# Patient Record
Sex: Male | Born: 1970 | Race: Black or African American | Hispanic: No | Smoking: Never smoker
Health system: Southern US, Community
[De-identification: ages and names within clinical notes are randomized; demographics above are authoritative.]

## PROBLEM LIST (undated history)

## (undated) ENCOUNTER — Emergency Department (HOSPITAL_COMMUNITY): Payer: Medicare Other | Source: Home / Self Care

## (undated) DIAGNOSIS — J9610 Chronic respiratory failure, unspecified whether with hypoxia or hypercapnia: Secondary | ICD-10-CM

## (undated) DIAGNOSIS — F329 Major depressive disorder, single episode, unspecified: Secondary | ICD-10-CM

## (undated) DIAGNOSIS — I4892 Unspecified atrial flutter: Secondary | ICD-10-CM

## (undated) DIAGNOSIS — L03119 Cellulitis of unspecified part of limb: Secondary | ICD-10-CM

## (undated) DIAGNOSIS — H539 Unspecified visual disturbance: Secondary | ICD-10-CM

## (undated) DIAGNOSIS — I10 Essential (primary) hypertension: Secondary | ICD-10-CM

## (undated) DIAGNOSIS — H02409 Unspecified ptosis of unspecified eyelid: Secondary | ICD-10-CM

## (undated) DIAGNOSIS — F32A Depression, unspecified: Secondary | ICD-10-CM

## (undated) DIAGNOSIS — N182 Chronic kidney disease, stage 2 (mild): Secondary | ICD-10-CM

## (undated) DIAGNOSIS — E119 Type 2 diabetes mellitus without complications: Secondary | ICD-10-CM

## (undated) DIAGNOSIS — M545 Low back pain, unspecified: Secondary | ICD-10-CM

## (undated) DIAGNOSIS — I4891 Unspecified atrial fibrillation: Secondary | ICD-10-CM

## (undated) DIAGNOSIS — G8929 Other chronic pain: Secondary | ICD-10-CM

## (undated) DIAGNOSIS — G4730 Sleep apnea, unspecified: Secondary | ICD-10-CM

## (undated) DIAGNOSIS — I509 Heart failure, unspecified: Secondary | ICD-10-CM

## (undated) DIAGNOSIS — S86819A Strain of other muscle(s) and tendon(s) at lower leg level, unspecified leg, initial encounter: Secondary | ICD-10-CM

## (undated) DIAGNOSIS — E662 Morbid (severe) obesity with alveolar hypoventilation: Secondary | ICD-10-CM

## (undated) DIAGNOSIS — L0291 Cutaneous abscess, unspecified: Secondary | ICD-10-CM

## (undated) HISTORY — DX: Cutaneous abscess, unspecified: L02.91

## (undated) HISTORY — DX: Unspecified atrial fibrillation: I48.91

## (undated) HISTORY — DX: Low back pain: M54.5

## (undated) HISTORY — DX: Type 2 diabetes mellitus without complications: E11.9

## (undated) HISTORY — DX: Morbid (severe) obesity due to excess calories: E66.01

## (undated) HISTORY — DX: Other chronic pain: G89.29

## (undated) HISTORY — DX: Major depressive disorder, single episode, unspecified: F32.9

## (undated) HISTORY — PX: APPLICATION OF WOUND VAC: SHX5189

## (undated) HISTORY — DX: Depression, unspecified: F32.A

## (undated) HISTORY — DX: Heart failure, unspecified: I50.9

## (undated) HISTORY — PX: TONSILLECTOMY: SUR1361

## (undated) HISTORY — DX: Unspecified ptosis of unspecified eyelid: H02.409

## (undated) HISTORY — DX: Unspecified atrial flutter: I48.92

## (undated) HISTORY — PX: TONSILLECTOMY: SHX5217

## (undated) HISTORY — DX: Low back pain, unspecified: M54.50

## (undated) HISTORY — DX: Essential (primary) hypertension: I10

## (undated) HISTORY — PX: CHOLECYSTECTOMY: SHX55

---

## 2002-10-31 ENCOUNTER — Emergency Department (HOSPITAL_COMMUNITY): Admission: EM | Admit: 2002-10-31 | Discharge: 2002-10-31 | Payer: Self-pay | Admitting: Emergency Medicine

## 2003-04-27 ENCOUNTER — Emergency Department (HOSPITAL_COMMUNITY): Admission: EM | Admit: 2003-04-27 | Discharge: 2003-04-27 | Payer: Self-pay | Admitting: Emergency Medicine

## 2003-04-27 ENCOUNTER — Encounter: Payer: Self-pay | Admitting: Emergency Medicine

## 2003-06-05 ENCOUNTER — Emergency Department (HOSPITAL_COMMUNITY): Admission: EM | Admit: 2003-06-05 | Discharge: 2003-06-05 | Payer: Self-pay | Admitting: Emergency Medicine

## 2003-06-05 ENCOUNTER — Encounter: Payer: Self-pay | Admitting: Family Medicine

## 2003-06-08 ENCOUNTER — Emergency Department (HOSPITAL_COMMUNITY): Admission: AD | Admit: 2003-06-08 | Discharge: 2003-06-08 | Payer: Self-pay | Admitting: Emergency Medicine

## 2003-06-08 ENCOUNTER — Encounter: Payer: Self-pay | Admitting: Emergency Medicine

## 2004-03-31 ENCOUNTER — Emergency Department (HOSPITAL_COMMUNITY): Admission: EM | Admit: 2004-03-31 | Discharge: 2004-04-01 | Payer: Self-pay | Admitting: Emergency Medicine

## 2004-05-05 ENCOUNTER — Emergency Department (HOSPITAL_COMMUNITY): Admission: EM | Admit: 2004-05-05 | Discharge: 2004-05-05 | Payer: Self-pay | Admitting: Emergency Medicine

## 2005-02-08 ENCOUNTER — Emergency Department (HOSPITAL_COMMUNITY): Admission: EM | Admit: 2005-02-08 | Discharge: 2005-02-08 | Payer: Self-pay | Admitting: Emergency Medicine

## 2006-06-20 ENCOUNTER — Emergency Department (HOSPITAL_COMMUNITY): Admission: EM | Admit: 2006-06-20 | Discharge: 2006-06-20 | Payer: Self-pay | Admitting: Emergency Medicine

## 2006-12-05 ENCOUNTER — Emergency Department (HOSPITAL_COMMUNITY): Admission: EM | Admit: 2006-12-05 | Discharge: 2006-12-05 | Payer: Self-pay | Admitting: Family Medicine

## 2007-01-11 ENCOUNTER — Ambulatory Visit: Payer: Self-pay | Admitting: Internal Medicine

## 2007-01-11 LAB — CONVERTED CEMR LAB
ALT: 13 units/L (ref 0–40)
AST: 23 units/L (ref 0–37)
Alkaline Phosphatase: 63 units/L (ref 39–117)
BUN: 9 mg/dL (ref 6–23)
Basophils Relative: 0.3 % (ref 0.0–1.0)
Bilirubin, Direct: 0.1 mg/dL (ref 0.0–0.3)
CO2: 30 meq/L (ref 19–32)
Calcium: 9 mg/dL (ref 8.4–10.5)
Chloride: 106 meq/L (ref 96–112)
Eosinophils Absolute: 0 10*3/uL (ref 0.0–0.6)
Eosinophils Relative: 0.3 % (ref 0.0–5.0)
GFR calc Af Amer: 164 mL/min
GFR calc non Af Amer: 136 mL/min
Glucose, Bld: 53 mg/dL — ABNORMAL LOW (ref 70–99)
HDL: 37.4 mg/dL — ABNORMAL LOW (ref >39.0)
Hgb A1c MFr Bld: 9.4 % — ABNORMAL HIGH (ref 4.6–6.0)
Microalb Creat Ratio: 136.7 mg/g — ABNORMAL HIGH (ref 0.0–30.0)
Monocytes Relative: 4.5 % (ref 3.0–11.0)
Neutro Abs: 10.3 10*3/uL — ABNORMAL HIGH (ref 1.4–7.7)
Platelets: 229 10*3/uL (ref 150–400)
RBC: 5.1 M/uL (ref 4.22–5.81)
Total CHOL/HDL Ratio: 3.9
Triglycerides: 113 mg/dL (ref 0–149)
VLDL: 23 mg/dL (ref 0–40)
WBC: 12.4 10*3/uL — ABNORMAL HIGH (ref 4.5–10.5)

## 2007-02-14 ENCOUNTER — Ambulatory Visit: Payer: Self-pay | Admitting: Endocrinology

## 2007-03-28 ENCOUNTER — Ambulatory Visit: Payer: Self-pay | Admitting: Internal Medicine

## 2007-04-04 ENCOUNTER — Ambulatory Visit: Payer: Self-pay | Admitting: Internal Medicine

## 2007-05-20 ENCOUNTER — Ambulatory Visit: Payer: Self-pay | Admitting: Internal Medicine

## 2007-05-20 LAB — CONVERTED CEMR LAB
BUN: 8 mg/dL (ref 6–23)
Calcium: 9.1 mg/dL (ref 8.4–10.5)
Chloride: 105 meq/L (ref 96–112)
Creatinine,U: 97.9 mg/dL
GFR calc Af Amer: 141 mL/min
GFR calc non Af Amer: 116 mL/min
Hgb A1c MFr Bld: 10.3 % — ABNORMAL HIGH (ref 4.6–6.0)
Microalb Creat Ratio: 80.7 mg/g — ABNORMAL HIGH (ref 0.0–30.0)
Microalb, Ur: 7.9 mg/dL — ABNORMAL HIGH (ref 0.0–1.9)

## 2007-06-17 ENCOUNTER — Encounter: Payer: Self-pay | Admitting: Endocrinology

## 2007-06-17 DIAGNOSIS — I10 Essential (primary) hypertension: Secondary | ICD-10-CM | POA: Insufficient documentation

## 2007-06-17 DIAGNOSIS — F418 Other specified anxiety disorders: Secondary | ICD-10-CM | POA: Insufficient documentation

## 2007-08-26 ENCOUNTER — Emergency Department (HOSPITAL_COMMUNITY): Admission: EM | Admit: 2007-08-26 | Discharge: 2007-08-26 | Payer: Self-pay | Admitting: Family Medicine

## 2008-01-23 ENCOUNTER — Emergency Department (HOSPITAL_COMMUNITY): Admission: EM | Admit: 2008-01-23 | Discharge: 2008-01-24 | Payer: Self-pay | Admitting: Emergency Medicine

## 2008-03-20 ENCOUNTER — Inpatient Hospital Stay (HOSPITAL_COMMUNITY): Admission: EM | Admit: 2008-03-20 | Discharge: 2008-03-25 | Payer: Self-pay | Admitting: Emergency Medicine

## 2008-07-24 ENCOUNTER — Emergency Department (HOSPITAL_COMMUNITY): Admission: EM | Admit: 2008-07-24 | Discharge: 2008-07-25 | Payer: Self-pay | Admitting: Emergency Medicine

## 2008-10-06 ENCOUNTER — Emergency Department (HOSPITAL_COMMUNITY): Admission: EM | Admit: 2008-10-06 | Discharge: 2008-10-06 | Payer: Self-pay | Admitting: Emergency Medicine

## 2008-10-29 ENCOUNTER — Encounter: Admission: RE | Admit: 2008-10-29 | Discharge: 2008-10-29 | Payer: Self-pay | Admitting: Family Medicine

## 2009-04-30 ENCOUNTER — Encounter: Payer: Self-pay | Admitting: Emergency Medicine

## 2009-04-30 ENCOUNTER — Ambulatory Visit: Payer: Self-pay | Admitting: Cardiovascular Disease

## 2009-05-01 ENCOUNTER — Observation Stay (HOSPITAL_COMMUNITY): Admission: EM | Admit: 2009-05-01 | Discharge: 2009-05-04 | Payer: Self-pay | Admitting: Cardiovascular Disease

## 2009-05-01 ENCOUNTER — Ambulatory Visit: Payer: Self-pay | Admitting: Cardiology

## 2009-05-03 ENCOUNTER — Encounter: Payer: Self-pay | Admitting: Cardiology

## 2009-05-06 ENCOUNTER — Encounter (INDEPENDENT_AMBULATORY_CARE_PROVIDER_SITE_OTHER): Payer: Self-pay | Admitting: *Deleted

## 2009-05-06 ENCOUNTER — Ambulatory Visit: Payer: Self-pay | Admitting: Cardiology

## 2009-05-11 ENCOUNTER — Ambulatory Visit: Payer: Self-pay | Admitting: Internal Medicine

## 2009-05-18 ENCOUNTER — Telehealth: Payer: Self-pay | Admitting: Cardiology

## 2009-05-21 ENCOUNTER — Emergency Department (HOSPITAL_COMMUNITY): Admission: EM | Admit: 2009-05-21 | Discharge: 2009-05-22 | Payer: Self-pay | Admitting: Emergency Medicine

## 2009-05-24 ENCOUNTER — Telehealth: Payer: Self-pay | Admitting: Cardiology

## 2009-05-25 ENCOUNTER — Ambulatory Visit: Payer: Self-pay | Admitting: Cardiology

## 2009-05-25 ENCOUNTER — Inpatient Hospital Stay (HOSPITAL_COMMUNITY): Admission: EM | Admit: 2009-05-25 | Discharge: 2009-06-02 | Payer: Self-pay | Admitting: Emergency Medicine

## 2009-05-28 ENCOUNTER — Telehealth: Payer: Self-pay | Admitting: Cardiology

## 2009-05-31 ENCOUNTER — Encounter: Payer: Self-pay | Admitting: Cardiology

## 2009-05-31 ENCOUNTER — Ambulatory Visit: Payer: Self-pay | Admitting: Pulmonary Disease

## 2009-06-01 ENCOUNTER — Encounter: Payer: Self-pay | Admitting: Cardiology

## 2009-06-02 DIAGNOSIS — R0602 Shortness of breath: Secondary | ICD-10-CM

## 2009-06-02 DIAGNOSIS — I4891 Unspecified atrial fibrillation: Secondary | ICD-10-CM | POA: Insufficient documentation

## 2009-06-02 DIAGNOSIS — J40 Bronchitis, not specified as acute or chronic: Secondary | ICD-10-CM | POA: Insufficient documentation

## 2009-06-02 DIAGNOSIS — I4892 Unspecified atrial flutter: Secondary | ICD-10-CM | POA: Insufficient documentation

## 2009-06-02 DIAGNOSIS — H5500 Unspecified nystagmus: Secondary | ICD-10-CM | POA: Insufficient documentation

## 2009-06-02 DIAGNOSIS — J309 Allergic rhinitis, unspecified: Secondary | ICD-10-CM | POA: Insufficient documentation

## 2009-06-02 DIAGNOSIS — R7309 Other abnormal glucose: Secondary | ICD-10-CM | POA: Insufficient documentation

## 2009-06-02 DIAGNOSIS — G4730 Sleep apnea, unspecified: Secondary | ICD-10-CM | POA: Insufficient documentation

## 2009-06-02 DIAGNOSIS — E662 Morbid (severe) obesity with alveolar hypoventilation: Secondary | ICD-10-CM | POA: Insufficient documentation

## 2009-06-02 DIAGNOSIS — I509 Heart failure, unspecified: Secondary | ICD-10-CM | POA: Insufficient documentation

## 2009-06-02 DIAGNOSIS — H02409 Unspecified ptosis of unspecified eyelid: Secondary | ICD-10-CM | POA: Insufficient documentation

## 2009-06-04 ENCOUNTER — Ambulatory Visit: Payer: Self-pay | Admitting: Internal Medicine

## 2009-06-04 LAB — CONVERTED CEMR LAB: POC INR: 1.9

## 2009-06-08 ENCOUNTER — Telehealth: Payer: Self-pay | Admitting: Cardiology

## 2009-06-09 ENCOUNTER — Emergency Department (HOSPITAL_COMMUNITY): Admission: EM | Admit: 2009-06-09 | Discharge: 2009-06-09 | Payer: Self-pay | Admitting: Emergency Medicine

## 2009-06-10 ENCOUNTER — Encounter: Payer: Self-pay | Admitting: Cardiology

## 2009-06-10 ENCOUNTER — Ambulatory Visit: Payer: Self-pay | Admitting: Cardiovascular Disease

## 2009-06-10 LAB — CONVERTED CEMR LAB: POC INR: 4

## 2009-06-11 ENCOUNTER — Ambulatory Visit: Payer: Self-pay | Admitting: Pulmonary Disease

## 2009-06-11 DIAGNOSIS — G4733 Obstructive sleep apnea (adult) (pediatric): Secondary | ICD-10-CM

## 2009-06-16 ENCOUNTER — Inpatient Hospital Stay (HOSPITAL_COMMUNITY): Admission: EM | Admit: 2009-06-16 | Discharge: 2009-06-21 | Payer: Self-pay | Admitting: Emergency Medicine

## 2009-06-28 ENCOUNTER — Ambulatory Visit: Payer: Self-pay | Admitting: Cardiology

## 2009-06-28 LAB — CONVERTED CEMR LAB: POC INR: 2.6

## 2009-06-29 ENCOUNTER — Telehealth (INDEPENDENT_AMBULATORY_CARE_PROVIDER_SITE_OTHER): Payer: Self-pay | Admitting: *Deleted

## 2009-07-07 ENCOUNTER — Inpatient Hospital Stay (HOSPITAL_COMMUNITY): Admission: EM | Admit: 2009-07-07 | Discharge: 2009-07-13 | Payer: Self-pay | Admitting: Emergency Medicine

## 2009-07-07 ENCOUNTER — Telehealth: Payer: Self-pay | Admitting: Cardiology

## 2009-07-07 ENCOUNTER — Ambulatory Visit: Payer: Self-pay | Admitting: Internal Medicine

## 2009-07-26 ENCOUNTER — Ambulatory Visit: Payer: Self-pay | Admitting: Cardiovascular Disease

## 2009-07-26 LAB — CONVERTED CEMR LAB: POC INR: 1.1

## 2009-07-30 ENCOUNTER — Ambulatory Visit (HOSPITAL_COMMUNITY): Admission: RE | Admit: 2009-07-30 | Discharge: 2009-07-30 | Payer: Self-pay | Admitting: Cardiology

## 2009-08-06 ENCOUNTER — Ambulatory Visit: Payer: Self-pay | Admitting: Cardiovascular Disease

## 2009-08-06 LAB — CONVERTED CEMR LAB: POC INR: 3.9

## 2009-08-23 ENCOUNTER — Encounter: Payer: Self-pay | Admitting: Pulmonary Disease

## 2009-08-23 ENCOUNTER — Ambulatory Visit: Payer: Self-pay | Admitting: Cardiology

## 2009-08-23 ENCOUNTER — Ambulatory Visit: Payer: Self-pay | Admitting: Internal Medicine

## 2009-08-23 ENCOUNTER — Ambulatory Visit (HOSPITAL_BASED_OUTPATIENT_CLINIC_OR_DEPARTMENT_OTHER): Admission: RE | Admit: 2009-08-23 | Discharge: 2009-08-23 | Payer: Self-pay | Admitting: Pulmonary Disease

## 2009-08-23 LAB — CONVERTED CEMR LAB: POC INR: 3

## 2009-09-05 ENCOUNTER — Ambulatory Visit: Payer: Self-pay | Admitting: Pulmonary Disease

## 2009-09-06 ENCOUNTER — Telehealth (INDEPENDENT_AMBULATORY_CARE_PROVIDER_SITE_OTHER): Payer: Self-pay | Admitting: *Deleted

## 2009-10-18 ENCOUNTER — Telehealth (INDEPENDENT_AMBULATORY_CARE_PROVIDER_SITE_OTHER): Payer: Self-pay | Admitting: *Deleted

## 2009-10-22 ENCOUNTER — Encounter: Payer: Self-pay | Admitting: Pulmonary Disease

## 2009-10-28 ENCOUNTER — Encounter (INDEPENDENT_AMBULATORY_CARE_PROVIDER_SITE_OTHER): Payer: Self-pay | Admitting: Cardiology

## 2009-11-09 ENCOUNTER — Ambulatory Visit: Payer: Self-pay | Admitting: Cardiology

## 2009-11-09 ENCOUNTER — Ambulatory Visit: Payer: Self-pay | Admitting: Vascular Surgery

## 2009-11-09 ENCOUNTER — Encounter (INDEPENDENT_AMBULATORY_CARE_PROVIDER_SITE_OTHER): Payer: Self-pay | Admitting: Internal Medicine

## 2009-11-09 ENCOUNTER — Inpatient Hospital Stay (HOSPITAL_COMMUNITY): Admission: EM | Admit: 2009-11-09 | Discharge: 2009-11-17 | Payer: Self-pay | Admitting: Emergency Medicine

## 2009-11-10 ENCOUNTER — Encounter (INDEPENDENT_AMBULATORY_CARE_PROVIDER_SITE_OTHER): Payer: Self-pay | Admitting: Internal Medicine

## 2009-11-16 ENCOUNTER — Encounter (INDEPENDENT_AMBULATORY_CARE_PROVIDER_SITE_OTHER): Payer: Self-pay | Admitting: *Deleted

## 2009-11-25 ENCOUNTER — Encounter (INDEPENDENT_AMBULATORY_CARE_PROVIDER_SITE_OTHER): Payer: Self-pay | Admitting: Cardiology

## 2009-11-26 ENCOUNTER — Encounter: Payer: Self-pay | Admitting: Cardiology

## 2009-12-13 ENCOUNTER — Encounter: Payer: Self-pay | Admitting: Internal Medicine

## 2009-12-17 ENCOUNTER — Encounter: Payer: Self-pay | Admitting: Cardiovascular Disease

## 2009-12-17 LAB — CONVERTED CEMR LAB: Prothrombin Time: 16.4 s

## 2009-12-24 ENCOUNTER — Encounter: Payer: Self-pay | Admitting: Internal Medicine

## 2009-12-24 LAB — CONVERTED CEMR LAB: POC INR: 1.6

## 2009-12-30 ENCOUNTER — Observation Stay (HOSPITAL_COMMUNITY): Admission: EM | Admit: 2009-12-30 | Discharge: 2010-01-01 | Payer: Self-pay | Admitting: Emergency Medicine

## 2009-12-30 ENCOUNTER — Ambulatory Visit: Payer: Self-pay | Admitting: Cardiology

## 2010-01-10 ENCOUNTER — Encounter: Payer: Self-pay | Admitting: Cardiology

## 2010-01-18 ENCOUNTER — Encounter: Payer: Self-pay | Admitting: Cardiology

## 2010-01-18 ENCOUNTER — Emergency Department (HOSPITAL_COMMUNITY): Admission: EM | Admit: 2010-01-18 | Discharge: 2010-01-18 | Payer: Self-pay | Admitting: Emergency Medicine

## 2010-01-18 ENCOUNTER — Ambulatory Visit: Payer: Self-pay | Admitting: Cardiology

## 2010-01-19 ENCOUNTER — Ambulatory Visit: Payer: Self-pay | Admitting: Cardiology

## 2010-01-19 LAB — CONVERTED CEMR LAB: POC INR: 1.7

## 2010-02-03 ENCOUNTER — Encounter (INDEPENDENT_AMBULATORY_CARE_PROVIDER_SITE_OTHER): Payer: Self-pay | Admitting: Pharmacist

## 2010-02-03 ENCOUNTER — Encounter: Admission: RE | Admit: 2010-02-03 | Discharge: 2010-02-03 | Payer: Self-pay | Admitting: Surgery

## 2010-02-03 ENCOUNTER — Ambulatory Visit: Payer: Self-pay | Admitting: Internal Medicine

## 2010-02-03 LAB — CONVERTED CEMR LAB: POC INR: 2

## 2010-02-07 ENCOUNTER — Telehealth (INDEPENDENT_AMBULATORY_CARE_PROVIDER_SITE_OTHER): Payer: Self-pay | Admitting: *Deleted

## 2010-02-07 ENCOUNTER — Ambulatory Visit: Payer: Self-pay | Admitting: Cardiology

## 2010-02-07 DIAGNOSIS — R079 Chest pain, unspecified: Secondary | ICD-10-CM

## 2010-03-02 ENCOUNTER — Telehealth (INDEPENDENT_AMBULATORY_CARE_PROVIDER_SITE_OTHER): Payer: Self-pay | Admitting: *Deleted

## 2010-03-03 ENCOUNTER — Ambulatory Visit: Payer: Self-pay | Admitting: Internal Medicine

## 2010-03-03 LAB — CONVERTED CEMR LAB: POC INR: 2.8

## 2010-03-18 ENCOUNTER — Ambulatory Visit: Payer: Self-pay | Admitting: Cardiology

## 2010-03-31 ENCOUNTER — Ambulatory Visit: Payer: Self-pay | Admitting: Cardiology

## 2010-04-06 IMAGING — CR DG CHEST 2V
3 series · 3 of 3 positions shown · non-contrast
Comparison: Chest 06/01/2009 and 07/24/2008.

CLINICAL DATA: Chest pain and shortness of breath.

CHEST - 2 VIEW

[w chest pa *]
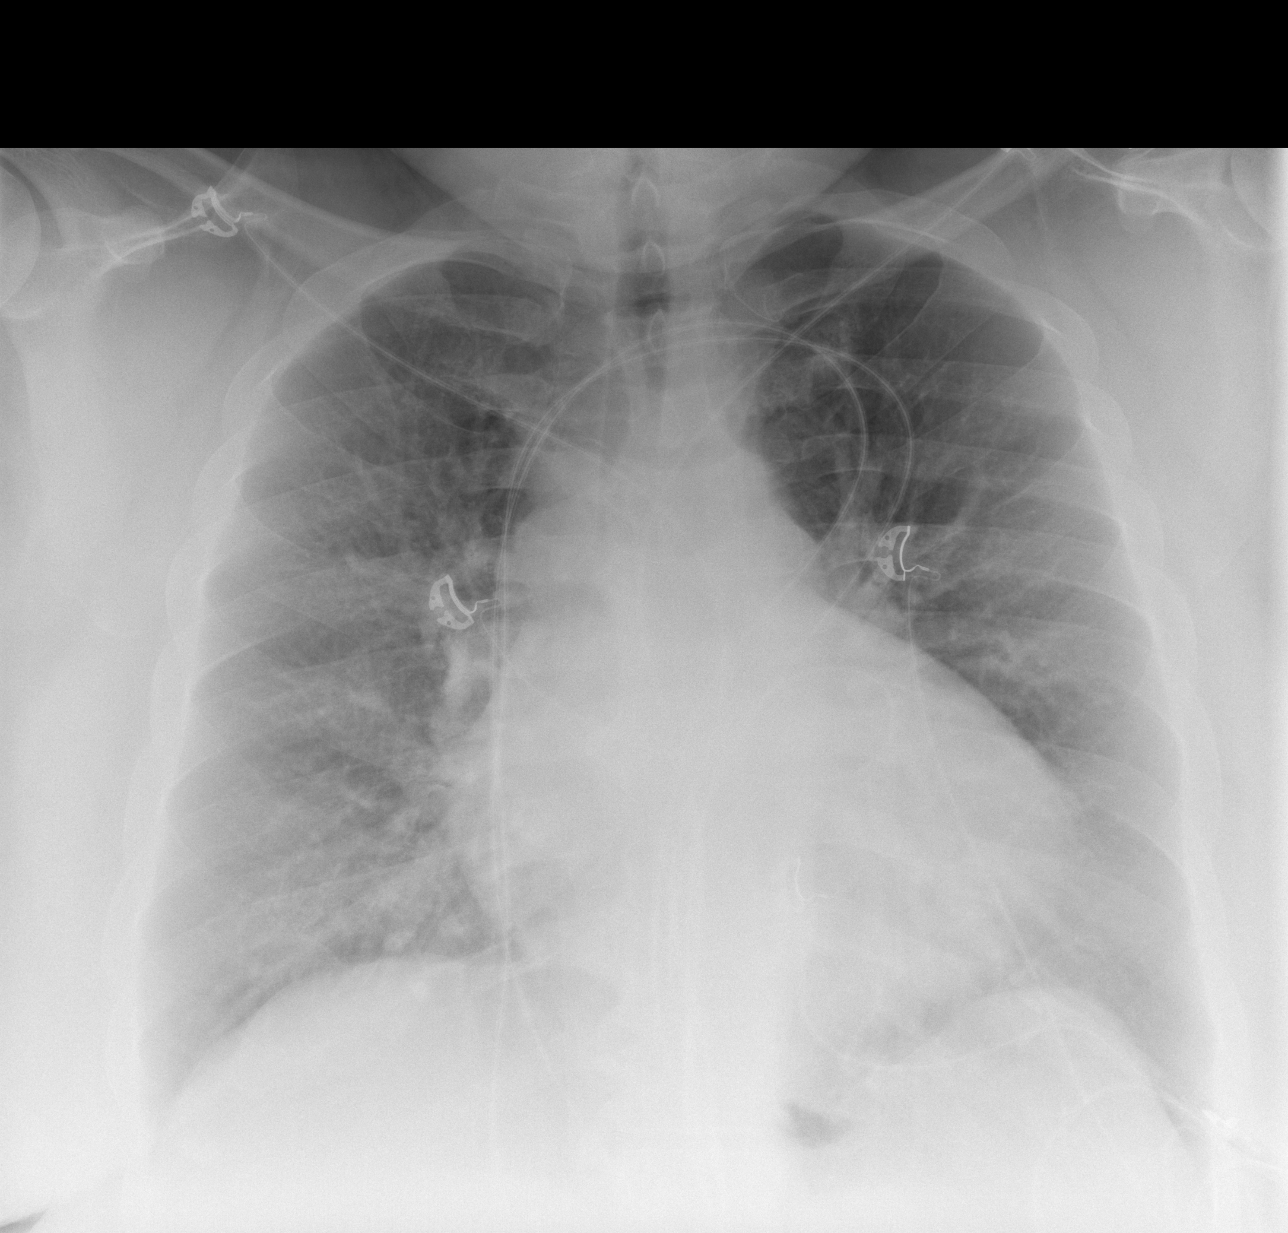

[w chest ap *]
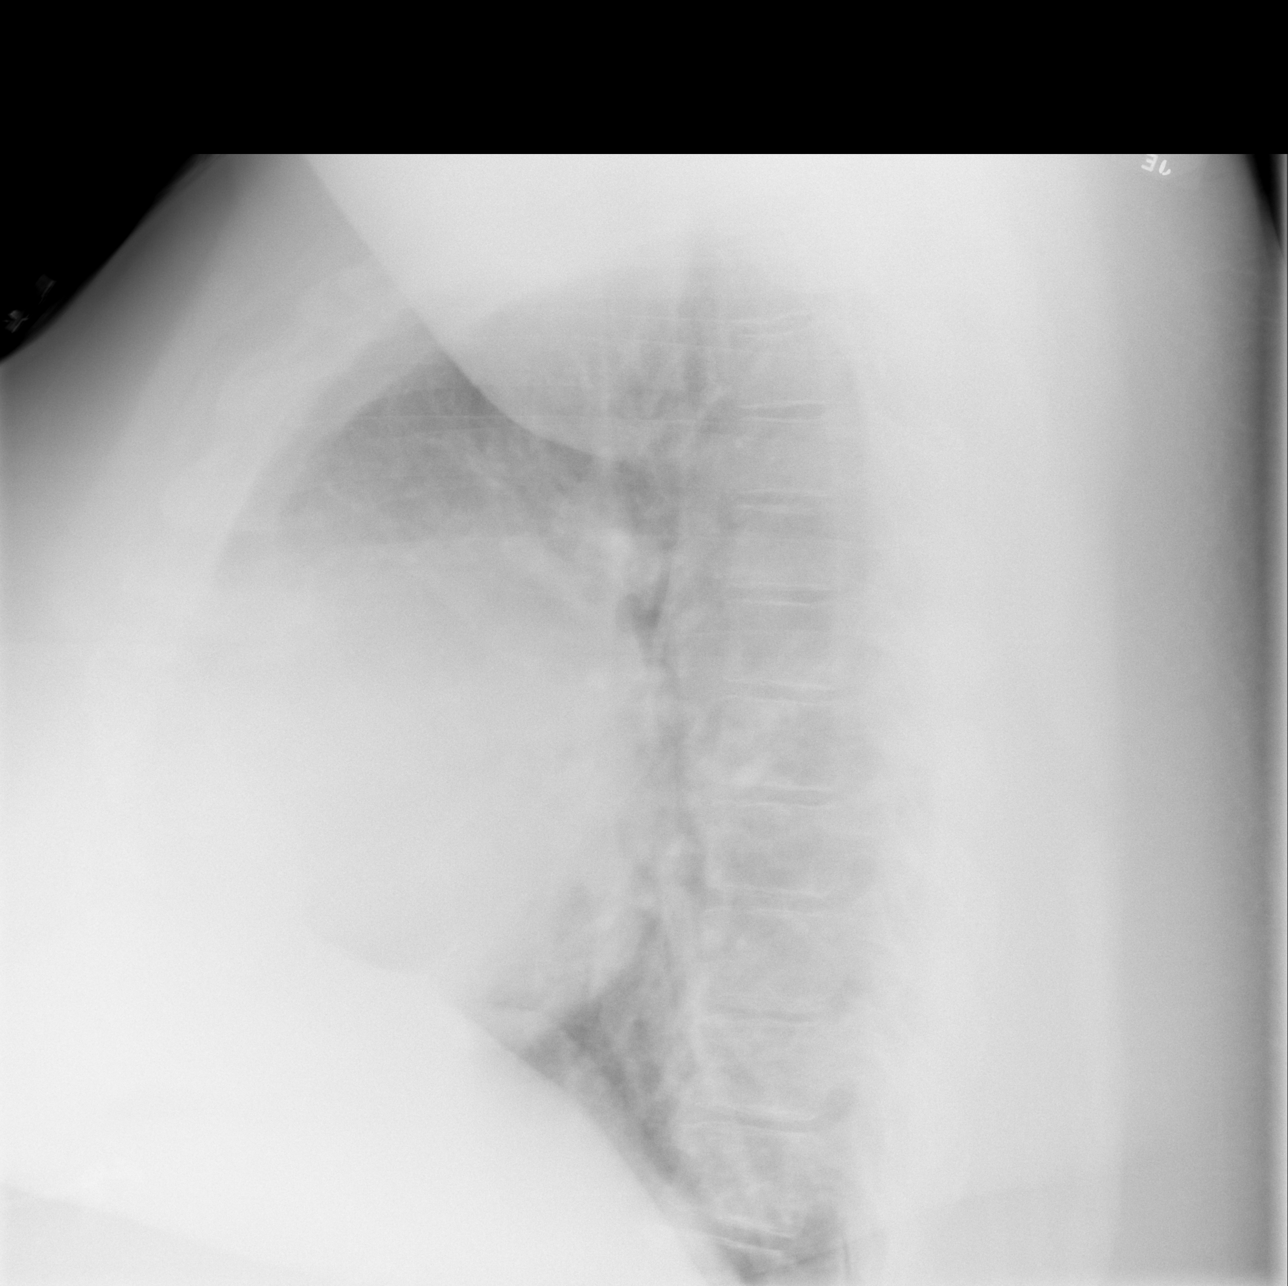

[w chest lat *]
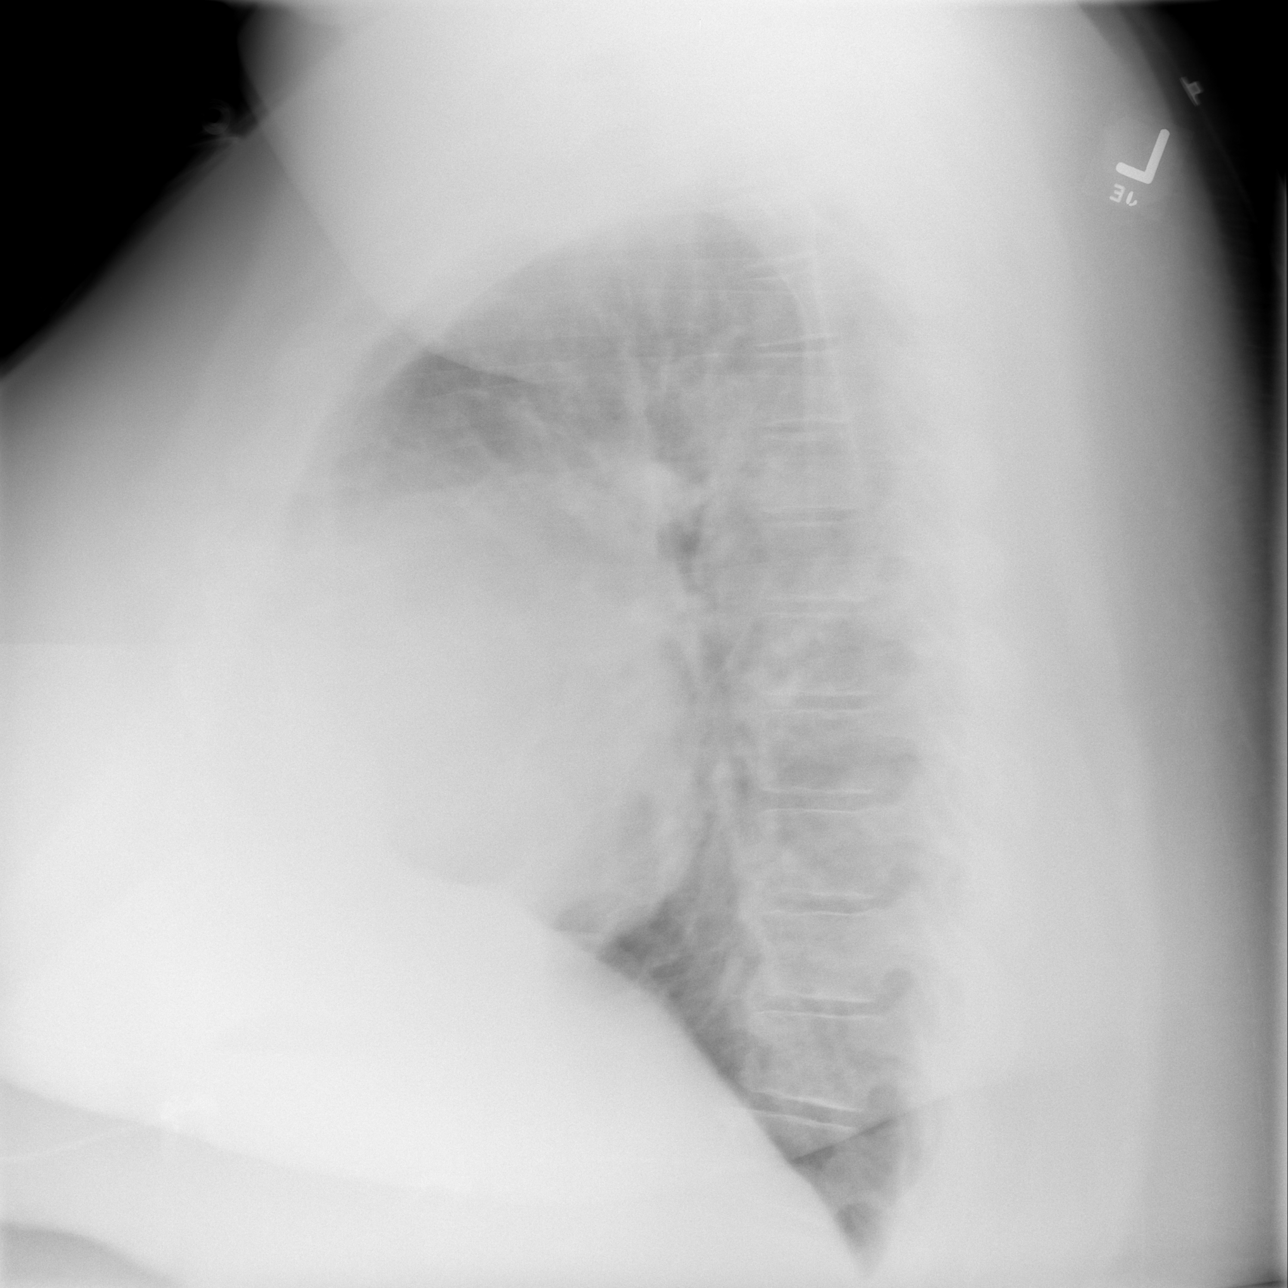

[3 of 3 positions shown; findings below may reference images not displayed]

FINDINGS: There is cardiomegaly and pulmonary vascular congestion
without frank edema.  No focal airspace disease or effusion.  No
focal bony abnormality.
IMPRESSION: Cardiomegaly and pulmonary vascular congestion.

## 2010-04-13 IMAGING — CR DG CHEST 1V PORT
2 series · 2 of 2 positions shown · non-contrast
Comparison: 06/09/2009

CLINICAL DATA: Chest pain/short of breath

PORTABLE CHEST - 1 VIEW

[view not recorded (1 of 2)]
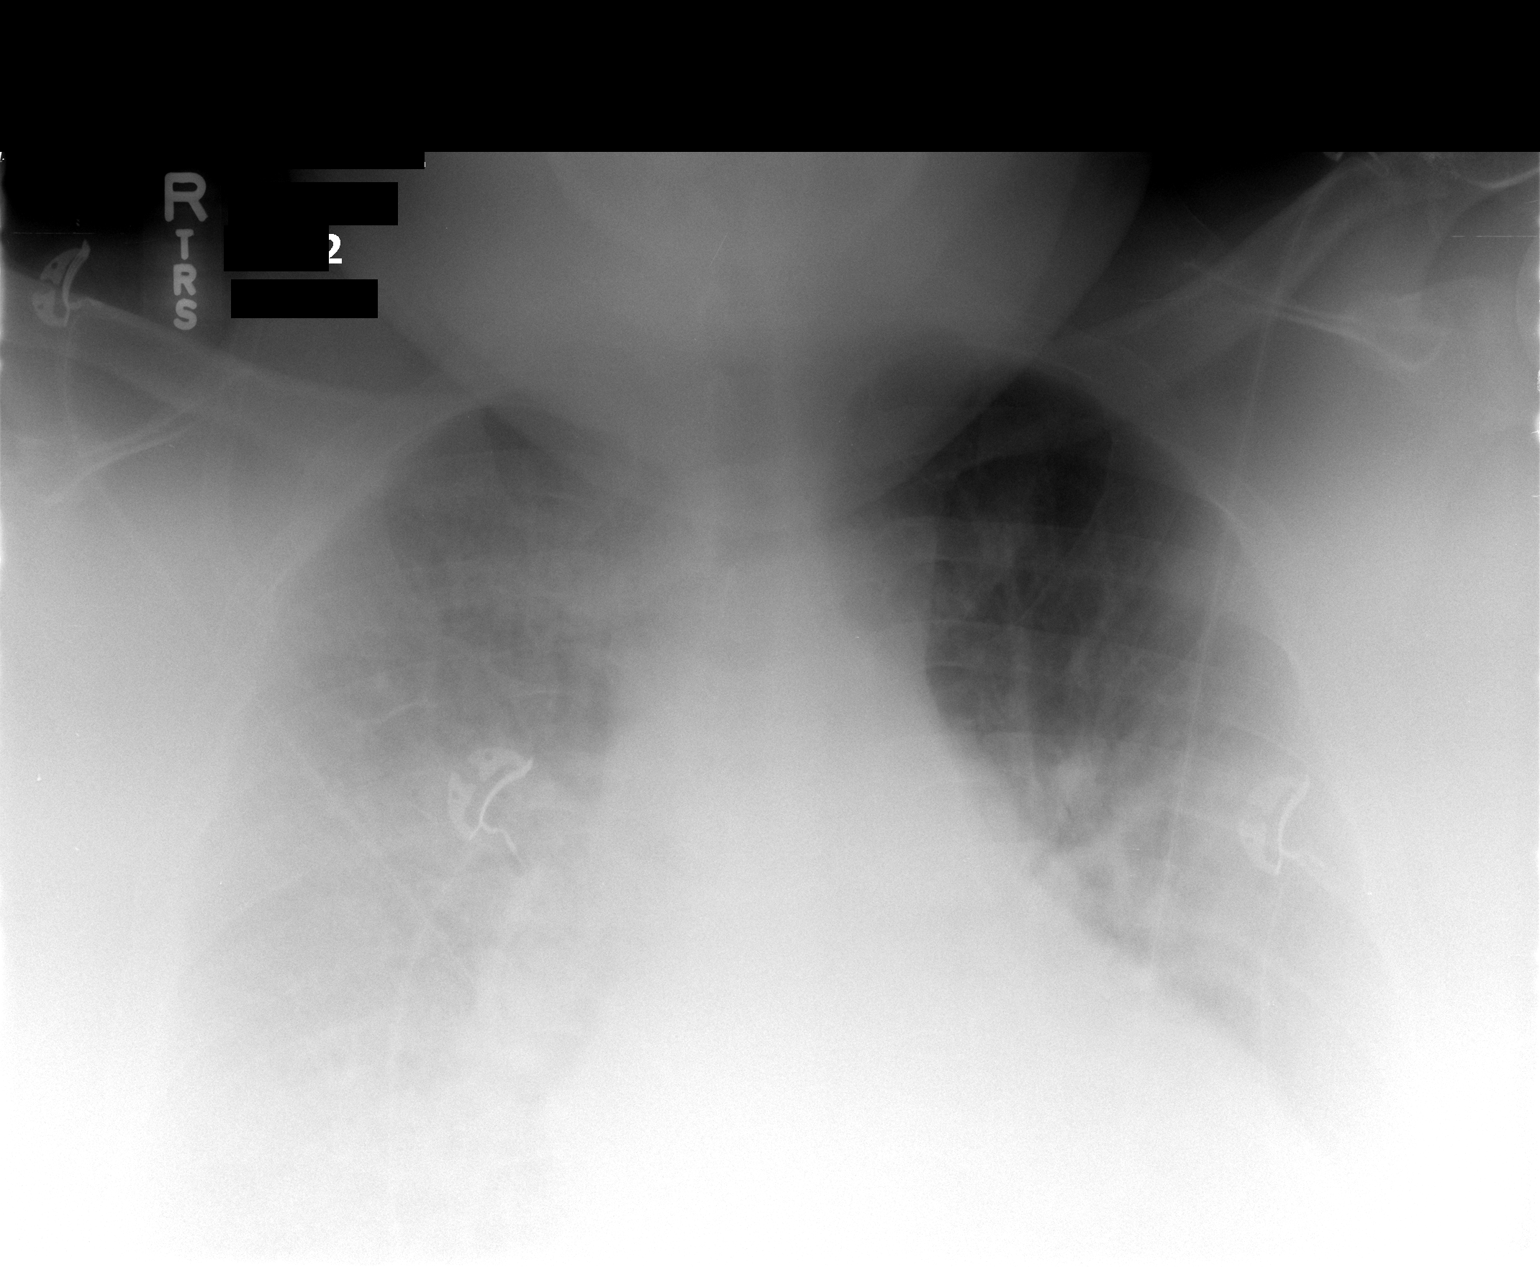

[view not recorded (2 of 2)]
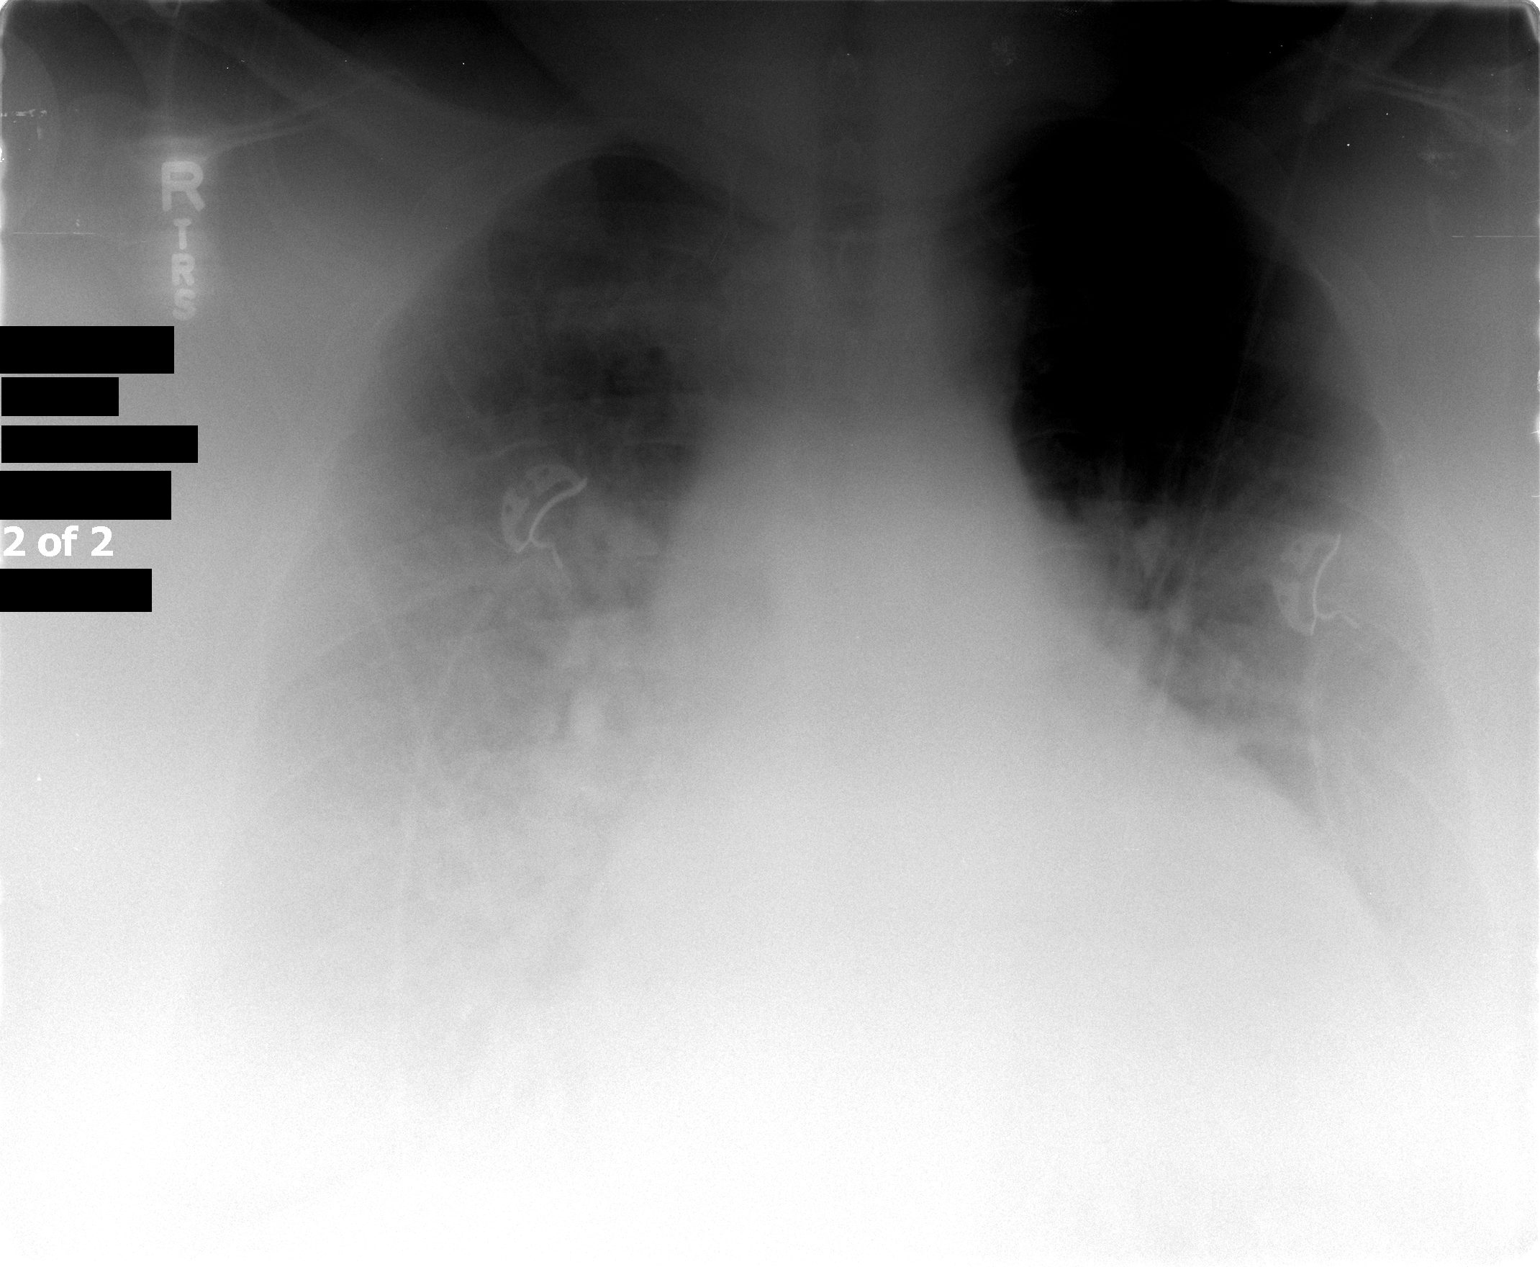

[2 of 2 positions shown; findings below may reference images not displayed]

FINDINGS: Technically under penetrated due to large body habitus.

The heart is enlarged and there is vascular congestion.  Cannot
exclude left lower lobe pneumonia although this is likely an
artifact of underpenetration.  When possible, PA and lateral chest
x-ray would be useful for further assessment.
IMPRESSION: 1.  Technically suboptimal due to large body habitus.
2.  Cardiomegaly with congestive heart failure.
3.  Cannot exclude left lower lobe pneumonia - recommend PA and
lateral chest x-ray when possible.

## 2010-04-19 ENCOUNTER — Observation Stay (HOSPITAL_COMMUNITY): Admission: EM | Admit: 2010-04-19 | Discharge: 2010-04-20 | Payer: Self-pay | Admitting: Emergency Medicine

## 2010-05-02 ENCOUNTER — Telehealth: Payer: Self-pay | Admitting: Cardiology

## 2010-05-17 ENCOUNTER — Encounter: Admission: RE | Admit: 2010-05-17 | Discharge: 2010-06-30 | Payer: Self-pay | Admitting: Surgery

## 2010-05-26 ENCOUNTER — Encounter: Payer: Self-pay | Admitting: Cardiology

## 2010-06-08 ENCOUNTER — Encounter: Payer: Self-pay | Admitting: Cardiology

## 2010-06-24 ENCOUNTER — Encounter (INDEPENDENT_AMBULATORY_CARE_PROVIDER_SITE_OTHER): Payer: Self-pay | Admitting: *Deleted

## 2010-07-25 ENCOUNTER — Emergency Department (HOSPITAL_COMMUNITY): Admission: EM | Admit: 2010-07-25 | Discharge: 2010-07-25 | Payer: Self-pay | Admitting: Emergency Medicine

## 2010-07-26 ENCOUNTER — Encounter: Payer: Self-pay | Admitting: Pulmonary Disease

## 2010-08-11 ENCOUNTER — Telehealth (INDEPENDENT_AMBULATORY_CARE_PROVIDER_SITE_OTHER): Payer: Self-pay | Admitting: *Deleted

## 2010-09-13 ENCOUNTER — Ambulatory Visit: Payer: Self-pay | Admitting: Internal Medicine

## 2010-09-13 ENCOUNTER — Ambulatory Visit: Payer: Self-pay | Admitting: Cardiology

## 2010-09-13 ENCOUNTER — Encounter: Payer: Self-pay | Admitting: Cardiology

## 2010-09-13 LAB — CONVERTED CEMR LAB: INR: 1.1

## 2010-09-20 ENCOUNTER — Ambulatory Visit: Payer: Self-pay | Admitting: Cardiology

## 2010-09-21 ENCOUNTER — Ambulatory Visit: Payer: Self-pay | Admitting: Cardiology

## 2010-10-10 ENCOUNTER — Ambulatory Visit: Admit: 2010-10-10 | Payer: Self-pay

## 2010-10-24 ENCOUNTER — Telehealth (INDEPENDENT_AMBULATORY_CARE_PROVIDER_SITE_OTHER): Payer: Self-pay | Admitting: *Deleted

## 2010-11-01 NOTE — Medication Information (Signed)
Summary: rov/sp  Anticoagulant Therapy  Managed by: Weston Brass, PharmD Referring MD: Dr Jens Som PCP: Dr Mila Palmer Supervising MD: Graciela Husbands MD, Viviann Spare Indication 1: Atrial Flutter Lab Used: [facility] Site: [facility] INR POC 2.8 INR RANGE 2.0-3.0  Dietary changes: yes       Details: High protein , low carb diet, modified intake of greens  Health status changes: yes       Details: gallbaldder issues ,pain ranges from 6 to 10  Bleeding/hemorrhagic complications: no    Recent/future hospitalizations: yes       Details: see note below for surgery  Any changes in medication regimen? no    Recent/future dental: no  Any missed doses?: no       Is patient compliant with meds? yes      Comments: Patient is scheduled with DR Chrisandra Carota for gallbladder surgery for 03/09/10. Patient to stop coumadin 03/04/10  Allergies: 1)  ! Iodine 2)  ! * Shellfish  Anticoagulation Management History:      The patient is taking warfarin and comes in today for a routine follow up visit.  Positive risk factors for bleeding include presence of serious comorbidities.  Negative risk factors for bleeding include an age less than 92 years old.  The bleeding index is 'intermediate risk'.  Positive CHADS2 values include History of CHF, History of HTN, and History of Diabetes.  Negative CHADS2 values include Age > 40 years old.  Anticoagulation responsible provider: Graciela Husbands MD, Viviann Spare.  INR POC: 2.8.  Cuvette Lot#: [id number].  Exp: 05/2011.    Anticoagulation Management Assessment/Plan:      The patient's current anticoagulation dose is Warfarin sodium 10 mg tabs: Use as directed by Anticoagulation Clinic.  The target INR is 2.0-3.0.  The next INR is due 03/16/2010.  Anticoagulation instructions were given to home health nurse.  Results were reviewed/authorized by Weston Brass, PharmD.  He was notified by [facility].         Prior Anticoagulation Instructions: The patient is to continue with  the same dose of coumadin.  This dosage includes:   Current Anticoagulation Instructions: INR-2.8 Do not take any coumadin until admit to hospital. Follow hospital instructionsas per Dr Barnetta Chapel. Return 03/16/10 a week after surgery and follw hospital instructions on dischargea and contact coumadin clinic. Prescriptions: WARFARIN SODIUM 10 MG TABS (WARFARIN SODIUM) Use as directed by Anticoagulation Clinic  #50 x 3   Entered by:   Weston Brass PharmD   Authorized by:   Ferman Hamming, MD, [facility]   Signed by:   Weston Brass PharmD on 03/03/2010   Method used:   Electronically to        [facility]  [address] [phone][facility] (retail)       [address]       [place], [place]  [address]       Ph: [phone] or [phone]       Fax: [phone]   RxID:   [id number]

## 2010-11-01 NOTE — Medication Information (Signed)
Summary: Coumadin Clinic  Anticoagulant Therapy  Managed by: Cloyde Reams, RN, BSN Referring MD: Dr Jens Som PCP: Dr Mila Palmer Supervising MD: Clifton James MD, Cristal Deer Indication 1: Atrial Flutter Lab Used: [facility] Site: [address] PT 16.4 INR POC 1.6 INR RANGE 2.0-3.0    Bleeding/hemorrhagic complications: no     Any changes in medication regimen? no     Any missed doses?: no         Allergies: 1)  ! Iodine 2)  ! * Shellfish  Anticoagulation Management History:      His anticoagulation is being managed by telephone today.  Positive risk factors for bleeding include presence of serious comorbidities.  Negative risk factors for bleeding include an age less than 42 years old.  The bleeding index is 'intermediate risk'.  Positive CHADS2 values include History of CHF, History of HTN, and History of Diabetes.  Negative CHADS2 values include Age > 39 years old.  Prothrombin time is 16.4.  Anticoagulation responsible provider: Clifton James MD, Cristal Deer.  INR POC: 1.6.  Exp: 07/2010.    Anticoagulation Management Assessment/Plan:      The patient's current anticoagulation dose is Warfarin sodium 10 mg tabs: Use as directed by Anticoagulation Clinic.  The target INR is 2.0-3.0.  The next INR is due 12/24/2009.  Anticoagulation instructions were given to home health nurse.  Results were reviewed/authorized by Cloyde Reams, RN, BSN.  He was notified by Cloyde Reams RN.         Prior Anticoagulation Instructions: INR 2.0  Spoke with Corrie Dandy, [facility] RN while at pt's home.  Advised to have pt take 10mg  daily except 15mg  on MWF.  Recheck on Friday.  Current Anticoagulation Instructions: INR 1.6  Spoke with Corrie Dandy, [facility] RN, advised to have pt resume his previous dosage of 15mg  daily except 10mg  on Sundays and Thursdays.  Recheck in 1 week.

## 2010-11-01 NOTE — Medication Information (Signed)
Summary: rov/kb  Anticoagulant Therapy  Managed by: Bethena Midget, RN, BSN Referring MD: Dr Jens Som PCP: Dr Mila Palmer Supervising MD: Juanda Chance MD, Vitor Overbaugh Indication 1: Atrial Flutter Lab Used: [facility] Site: [address] INR POC 1.1 INR RANGE 2.0-3.0  Dietary changes: no    Health status changes: no    Bleeding/hemorrhagic complications: no    Recent/future hospitalizations: yes       Details: Was just discharged last Friday.   Any changes in medication regimen? yes       Details: Oxycodone PRN  Recent/future dental: no  Any missed doses?: no       Is patient compliant with meds? yes      Comments: Last Wed 03/09/10 had GallBladder removed. Discharge from [facility] on Friday.   Allergies: 1)  ! Iodine 2)  ! * Shellfish  Anticoagulation Management History:      The patient comes in today for his initial visit for anticoagulation therapy.  Positive risk factors for bleeding include presence of serious comorbidities.  Negative risk factors for bleeding include an age less than 16 years old.  The bleeding index is 'intermediate risk'.  Positive CHADS2 values include History of CHF, History of HTN, and History of Diabetes.  Negative CHADS2 values include Age > 42 years old.  Anticoagulation responsible provider: Juanda Chance MD, Smitty Cords.  INR POC: 1.1.  Cuvette Lot#: [id number].  Exp: 05/2011.    Anticoagulation Management Assessment/Plan:      The patient's current anticoagulation dose is Warfarin sodium 10 mg tabs: Use as directed by Anticoagulation Clinic.  The target INR is 2.0-3.0.  The next INR is due 03/25/2010.  Anticoagulation instructions were given to home health nurse.  Results were reviewed/authorized by Bethena Midget, RN, BSN.  He was notified by Bethena Midget, RN, BSN.         Prior Anticoagulation Instructions: INR-2.8 Do not take any coumadin until admit to hospital. Follow hospital instructionsas per Dr Barnetta Chapel. Return 03/16/10 a week after surgery and follw  hospital instructions on dischargea and contact coumadin clinic.  Current Anticoagulation Instructions: INR 1.1 Today take extra 5mg s  and Saturday take 15mg s then resume 10mg s everyday except 15mg s on Mondays, Wednesdays and Fridays. Recheck in one week.

## 2010-11-01 NOTE — Progress Notes (Signed)
   Request recieved from DDS sent to [facility]. Cala Bradford Mesiemore  Feb 07, 2010 10:02 AM

## 2010-11-01 NOTE — Progress Notes (Signed)
  Phone Note Other Incoming   Request: Send information Summary of Call: Received request for document to be completed by Physician. Forwarded to Dr. Clance for completion.      

## 2010-11-01 NOTE — Consult Note (Signed)
[facility]: [facility]   Imported By: Harlon Flor 06/13/2010 16:21:56  _____________________________________________________________________  External Attachment:    Type:   Image     Comment:   External Document

## 2010-11-01 NOTE — Medication Information (Signed)
Summary: rov/sp  Anticoagulant Therapy  Managed by: Charolotte Eke, PharmD Referring MD: Dr Jens Som PCP: Dr Mila Palmer Supervising MD: Jens Som MD, Arlys John Indication 1: Atrial Flutter Lab Used: [facility]  Site: [facility] INR POC 2.0 INR RANGE 2.0-3.0  Dietary changes: no    Health status changes: no    Bleeding/hemorrhagic complications: yes       Details: some bleeding hemmorhoids  Recent/future hospitalizations: yes       Details: seeking clearance for cholescystectomy  Any changes in medication regimen? no    Recent/future dental: no  Any missed doses?: no       Is patient compliant with meds? yes       Current Medications (verified): 1)  Humalog Mix 75/25 75-25 %  Susp (Insulin Lispro Prot & Lispro) .... 85 Units Morning and 65 Units At Bedtime 2)  Metformin Hcl 1000 Mg  Tabs (Metformin Hcl) .... Take 1 By Mouth Two Times A Day Qd 3)  Ibuprofen 200 Mg  Tabs (Ibuprofen) .... Take 1 By Mouth Prn 4)  Digoxin 0.25 Mg Tabs (Digoxin) .... Take 1 Tablet By Mouth Once A Day 5)  Ventolin Hfa 108 (90 Base) Mcg/act Aers (Albuterol Sulfate) .... As Directed 6)  Metoprolol Succinate 50 Mg Xr24h-Tab (Metoprolol Succinate) .... Take One Tablet By Mouth Daily 7)  Furosemide 40 Mg Tabs (Furosemide) .Marland Kitchen.. 1 Tab By Mouth Two Times A Day 8)  Warfarin Sodium 10 Mg Tabs (Warfarin Sodium) .... Use As Directed By Anticoagulation Clinic 9)  Amiodarone Hcl 200 Mg Tabs (Amiodarone Hcl) .... Take One Tablet By Mouth Daily 10)  Ursodiol 250 Mg Tabs (Ursodiol) .... 2 Tabs Tid  Allergies (verified): 1)  ! Iodine 2)  ! * Shellfish  Anticoagulation Management History:      The patient is taking warfarin and comes in today for a routine follow up visit.  Positive risk factors for bleeding include presence of serious comorbidities.  Negative risk factors for bleeding include an age less than 48 years old.  The bleeding index is 'intermediate risk'.  Positive CHADS2 values include  History of CHF, History of HTN, and History of Diabetes.  Negative CHADS2 values include Age > 5 years old.  Anticoagulation responsible provider: Jens Som MD, Arlys John.  INR POC: 2.0.  Cuvette Lot#: [id number].  Exp: 03/03/2011.    Anticoagulation Management Assessment/Plan:      The patient's current anticoagulation dose is Warfarin sodium 10 mg tabs: Use as directed by Anticoagulation Clinic.  The target INR is 2.0-3.0.  The next INR is due 02/24/2010.  Anticoagulation instructions were given to home health nurse.  Results were reviewed/authorized by Charolotte Eke, PharmD.  He was notified by Charolotte Eke, PharmD.         Prior Anticoagulation Instructions: INR 1.7  Increase dose to 1 tablet every day except 1 1/2 tablets on Monday, Wednesday and Friday   Current Anticoagulation Instructions: The patient is to continue with the same dose of coumadin.  This dosage includes:

## 2010-11-01 NOTE — Medication Information (Signed)
Summary: rov/ewj  Anticoagulant Therapy  Managed by: Weston Brass, PharmD Referring MD: Dr Jens Som PCP: Dr Mila Palmer Supervising MD: Jens Som MD, Arlys John Indication 1: Atrial Flutter Lab Used: [facility] Site: [facility] INR POC 1.7 INR RANGE 2.0-3.0  Dietary changes: yes       Details: increased his salads to almost daily   Health status changes: no    Bleeding/hemorrhagic complications: no    Recent/future hospitalizations: yes       Details: went to the ER yesterday for CP.  Had complete work-up.  Diagnosed as muscular pain rather than coronary related  Any changes in medication regimen? yes       Details: new medicine to help shrink gallstones   Recent/future dental: no  Any missed doses?: no       Is patient compliant with meds? yes       Allergies: 1)  ! Iodine 2)  ! * Shellfish  Anticoagulation Management History:      The patient is taking warfarin and comes in today for a routine follow up visit.  Positive risk factors for bleeding include presence of serious comorbidities.  Negative risk factors for bleeding include an age less than 55 years old.  The bleeding index is 'intermediate risk'.  Positive CHADS2 values include History of CHF, History of HTN, and History of Diabetes.  Negative CHADS2 values include Age > 68 years old.  Anticoagulation responsible provider: Jens Som MD, Arlys John.  INR POC: 1.7.  Cuvette Lot#: [id number].  Exp: 07/2010.    Anticoagulation Management Assessment/Plan:      The patient's current anticoagulation dose is Warfarin sodium 10 mg tabs: Use as directed by Anticoagulation Clinic.  The target INR is 2.0-3.0.  The next INR is due 02/02/2010.  Anticoagulation instructions were given to home health nurse.  Results were reviewed/authorized by Weston Brass, PharmD.  He was notified by Weston Brass PharmD.         Prior Anticoagulation Instructions: INR 1.6  Spoke with Corrie Dandy, [facility] RN.  Advised to have pt take 20mg  today then  start taking 15mg  daily except 10mg  on Thursdays.  Recheck in 1 week.    Current Anticoagulation Instructions: INR 1.7  Increase dose to 1 tablet every day except 1 1/2 tablets on Monday, Wednesday and Friday

## 2010-11-01 NOTE — Medication Information (Signed)
Summary: Coumadin Clinic  Anticoagulant Therapy  Managed by: Cloyde Reams, RN, BSN Referring MD: Dr Jens Som PCP: Dr Mila Palmer Supervising MD: Tenny Craw MD, Gunnar Fusi Indication 1: Atrial Flutter Lab Used: [facility] Site: [facility] PT 19.7 INR POC 2.0 INR RANGE 2.0-3.0   Health status changes: yes       Details: Pt fell on 12/03/09, returned to SNF to be checked out.    Bleeding/hemorrhagic complications: no     Any changes in medication regimen? no     Any missed doses?: no       Is patient compliant with meds? yes      Comments: Discharged from SNF on 12/08/09.  Discharged on 10mg  daily.   Allergies: 1)  ! Iodine 2)  ! * Shellfish  Anticoagulation Management History:      His anticoagulation is being managed by telephone today.  Positive risk factors for bleeding include presence of serious comorbidities.  Negative risk factors for bleeding include an age less than 59 years old.  The bleeding index is 'intermediate risk'.  Positive CHADS2 values include History of CHF, History of HTN, and History of Diabetes.  Negative CHADS2 values include Age > 16 years old.  Prothrombin time is 19.7.  Anticoagulation responsible provider: Tenny Craw MD, Gunnar Fusi.  INR POC: 2.0.  Exp: 07/2010.    Anticoagulation Management Assessment/Plan:      The patient's current anticoagulation dose is Warfarin sodium 10 mg tabs: Use as directed by Anticoagulation Clinic.  The target INR is 2.0-3.0.  The next INR is due 12/17/2009.  Anticoagulation instructions were given to home health nurse.  Results were reviewed/authorized by Cloyde Reams, RN, BSN.  He was notified by Cloyde Reams RN.         Prior Anticoagulation Instructions: INR 3.0  Continue on same dosage.  Recheck in 3 weeks.    Current Anticoagulation Instructions: INR 2.0  Spoke with Corrie Dandy, [facility] RN while at pt's home.  Advised to have pt take 10mg  daily except 15mg  on MWF.  Recheck on Friday.

## 2010-11-01 NOTE — Medication Information (Signed)
Summary: rov/tm  Anticoagulant Therapy  Managed by: Bethena Midget, RN, BSN Referring MD: Dr Jens Som PCP: Dr Mila Palmer Supervising MD: Shirlee Latch MD, Wynton Hufstetler Indication 1: Atrial Flutter Lab Used: [facility] Site: [address] INR POC 2.5 INR RANGE 2.0-3.0  Dietary changes: no    Health status changes: no    Bleeding/hemorrhagic complications: no    Recent/future hospitalizations: no    Any changes in medication regimen? no    Recent/future dental: no  Any missed doses?: no       Is patient compliant with meds? yes       Allergies: 1)  ! Iodine 2)  ! * Shellfish  Anticoagulation Management History:      The patient is taking warfarin and comes in today for a routine follow up visit.  Positive risk factors for bleeding include presence of serious comorbidities.  Negative risk factors for bleeding include an age less than 65 years old.  The bleeding index is 'intermediate risk'.  Positive CHADS2 values include History of CHF, History of HTN, and History of Diabetes.  Negative CHADS2 values include Age > 66 years old.  Anticoagulation responsible provider: Shirlee Latch MD, Renell Coaxum.  INR POC: 2.5.  Cuvette Lot#: [id number].  Exp: 06/2011.    Anticoagulation Management Assessment/Plan:      The patient's current anticoagulation dose is Warfarin sodium 10 mg tabs: Use as directed by Anticoagulation Clinic.  The target INR is 2.0-3.0.  The next INR is due 04/28/2010.  Anticoagulation instructions were given to patient.  Results were reviewed/authorized by Bethena Midget, RN, BSN.  He was notified by Bethena Midget, RN, BSN.         Prior Anticoagulation Instructions: INR 1.1 Today take extra 5mg s  and Saturday take 15mg s then resume 10mg s everyday except 15mg s on Mondays, Wednesdays and Fridays. Recheck in one week.   Current Anticoagulation Instructions: INR 2.5 Continue 10mg s everyday except 15mg s on Mondays, Wednesdays and Fridays. Recheck in 4 weeks.

## 2010-11-01 NOTE — Medication Information (Signed)
Summary: Coumadin Clinic  Anticoagulant Therapy  Managed by: Inactive Referring MD: Dr Jens Som PCP: Dr Mila Palmer Supervising MD: Jens Som MD, Arlys John Indication 1: Atrial Flutter Lab Used: [facility] [place] Site: [facility] INR RANGE 2.0-3.0          Comments: Pt called was admitted to [facility] 2/8-2/16.  Discharged to [facility].  Pt states he will call back to schedule f/u appt once discharged. Cloyde Reams RN  November 26, 2009 12:11 PM   Allergies: 1)  ! Iodine 2)  ! * Shellfish  Anticoagulation Management History:      Positive risk factors for bleeding include presence of serious comorbidities.  Negative risk factors for bleeding include an age less than 51 years old.  The bleeding index is 'intermediate risk'.  Positive CHADS2 values include History of CHF, History of HTN, and History of Diabetes.  Negative CHADS2 values include Age > 74 years old.  Anticoagulation responsible provider: Jens Som MD, Arlys John.  Exp: 07/2010.    Anticoagulation Management Assessment/Plan:      The patient's current anticoagulation dose is Warfarin sodium 10 mg tabs: Use as directed by Anticoagulation Clinic.  The target INR is 2.0-3.0.  The next INR is due 09/13/2009.  Anticoagulation instructions were given to patient.  Results were reviewed/authorized by Inactive.         Prior Anticoagulation Instructions: INR 3.0  Continue on same dosage.  Recheck in 3 weeks.

## 2010-11-01 NOTE — Letter (Signed)
Summary: [facility]in [facility]  Coumadin  [address]   [place], [place] [address]   Phone: [phone]  Fax: [phone]     October 28, 2009 MRN: [id number]   [name] [address] [place], [place]  [address]   Dear [name],  This letter is being sent to you as a reminder that it is necessary for you to get your INR/PT checked regularly so that we can optimize your care.  Our records indicate that you were scheduled to have a test done recently.  As of today, we have not received the results of this test.  It is very important that you have your INR checked.  Please call our office at the number listed above to schedule an appointment at your earliest convenience.    If you have recently had your protime checked or have discontinued this medication, please contact our office at the above phone number to clarify this issue.  Thank you for this prompt attention to this important health care matter.  Sincerely,   [facility] Team

## 2010-11-01 NOTE — Assessment & Plan Note (Signed)
Summary: rov/gd    Referring Provider:  Jens Som Primary Provider:  Dr Mila Palmer  CC:  chest pain, sob and dizziness, and pt took oxycodone which helped the pain.  History of Present Illness: Pleasant gentleman with paoxysmal  atrial fibrillation.  His TSH was normal.  He was previously admitted to [facility] with recurrent atrial flutter. He underwent a TEE guided cardioversion 10/10 successfully. Note the TEE revealed normal LV and RV function. He was seen by EP and amiodarone was recommended to help maintain sinus rhythm. Multiple recent admissions for atypical chest pain. He has ruled out for myocardial infarction. A d-dimer was normal. A VQ scan was technically difficult but low probability. An echocardiogram in Feb of 2011 was technically difficult but showed normal LV function. Since his last evaluation in April of 2011, he does have some dyspnea on exertion relieved with rest. There is no associated chest pain. There is no orthopnea or PND but he does have chronic pedal edema. He has not had syncope. He continues to have occasional chest pain. It is in the substernal area and radiates to the left breast. It is not positional or exertional. It can increase with inspiration. It resolves after one hour. There's no associated symptoms. He is seeing a physician in [place] concerning potential surgery for gallstones.  Current Medications (verified): 1)  Humalog Mix 75/25 75-25 %  Susp (Insulin Lispro Prot & Lispro) .... 85 Units Morning and 65 Units At Bedtime 2)  Ibuprofen 200 Mg  Tabs (Ibuprofen) .... Take 1 By Mouth Prn 3)  Digoxin 0.25 Mg Tabs (Digoxin) .... Take 1 Tablet By Mouth Once A Day 4)  Ventolin Hfa 108 (90 Base) Mcg/act Aers (Albuterol Sulfate) .... As Directed 5)  Metoprolol Succinate 50 Mg Xr24h-Tab (Metoprolol Succinate) .... Take One Tablet By Mouth Daily 6)  Furosemide 40 Mg Tabs (Furosemide) .Marland Kitchen.. 1 Tab By Mouth Two Times A Day 7)  Warfarin Sodium 10 Mg Tabs  (Warfarin Sodium) .... Use As Directed By Anticoagulation Clinic 8)  Amiodarone Hcl 200 Mg Tabs (Amiodarone Hcl) .... Take One Tablet By Mouth Daily 9)  Ursodiol 250 Mg Tabs (Ursodiol) .... 2 Tabs Tid  Allergies: 1)  ! Iodine 2)  ! * Shellfish  Past History:  Past Medical History: CHF (ICD-428.0) HYPERTENSION (ICD-401.9) PTOSIS (ICD-374.30) Atrial flutter ATRIAL FIBRILLATION (ICD-427.31) MORBID OBESITY (ICD-278.01) DIABETES MELLITUS, TYPE II (ICD-250.00) DEPRESSION (ICD-311) RESPIRATORY FAILURE (ICD-518.81) Chronic low back pain.   Past Surgical History: Reviewed history from 06/02/2009 and no changes required.  Tonsillectomy  Social History: Reviewed history from 06/02/2009 and no changes required.  The patient lives in [place] alone.  He works full-   time for [employer].  He has no significant tobacco, EtOH, or illicit drug use   history.  He does occasionally take Echinacea and B12 supplements.  He   has a regular diet and has not been regularly exercising over the last 3   months.      Review of Systems       Occasional right abdominal pain but no fevers or chills, productive cough, hemoptysis, dysphasia, odynophagia, melena, hematochezia, dysuria, hematuria, rash, seizure activity, orthopnea, PND, claudication. Remaining systems are negative.   Vital Signs:  Patient profile:   40 year old male Height:      76 inches Weight:      582 pounds BMI:     71.10 Pulse rate:   74 / minute Resp:     12 per minute BP sitting:  136 / 78  (left arm)  Vitals Entered By: Kem Parkinson (Feb 07, 2010 1:48 PM)  Physical Exam  General:  Well-developed morbidly obese  in no acute distress.  Skin is warm and dry.  HEENT is normal.  Neck is supple. No thyromegaly.  Chest is clear to auscultation with normal expansion.  Cardiovascular exam is regular rate and rhythm.  Abdominal exam nontender or distended. Morbidly obese Extremities show 1plus edema. neuro grossly  intact    EKG  Procedure date:  02/07/2010  Findings:      Normal sinus rhythm at a rate of 74. No ST  changes.  Impression & Recommendations:  Problem # 1:  PREOPERATIVE EXAMINATION (ICD-V72.84) Patient is being considered for surgery for his gallstones. We have no good way of risk stratification with Mr. [name]. He is too large for a cath table, cardiac MRI, cardiac CT or nuclear study. His echo images are poor and I do not think a dobutamine echocardiogram would be useful. Given his overall size I think he would be high risk for any surgery and complications. If it is deemed necessary then we should proceed with that knowledge. This was explained to he and his fiance.  Problem # 2:  OBSTRUCTIVE SLEEP APNEA (ICD-327.23) Continue CPAP.  Problem # 3:  HYPERTENSION (ICD-401.9)  Blood pressure controlled on present medications. Continue. His updated medication list for this problem includes:    Metoprolol Succinate 50 Mg Xr24h-tab (Metoprolol succinate) .Marland Kitchen... Take one tablet by mouth daily    Furosemide 40 Mg Tabs (Furosemide) .Marland Kitchen... 1 tab by mouth two times a day  His updated medication list for this problem includes:    Metoprolol Succinate 50 Mg Xr24h-tab (Metoprolol succinate) .Marland Kitchen... Take one tablet by mouth daily    Furosemide 40 Mg Tabs (Furosemide) .Marland Kitchen... 1 tab by mouth two times a day  Problem # 4:  ATRIAL FIBRILLATION (ICD-427.31)  Patient remains in sinus rhythm. Continue beta blocker, digoxin and amiodarone as well as Coumadin. Multiple embolic risk factors including hypertension and diabetes. If he requires surgery then would recommend discontinuing Coumadin 5 days prior to the procedure and resuming postoperatively when hemostasis achieved. His updated medication list for this problem includes:    Digoxin 0.25 Mg Tabs (Digoxin) .Marland Kitchen... Take 1 tablet by mouth once a day    Metoprolol Succinate 50 Mg Xr24h-tab (Metoprolol succinate) .Marland Kitchen... Take one tablet by mouth daily     Warfarin Sodium 10 Mg Tabs (Warfarin sodium) ..... Use as directed by anticoagulation clinic    Amiodarone Hcl 200 Mg Tabs (Amiodarone hcl) .Marland Kitchen... Take one tablet by mouth daily  His updated medication list for this problem includes:    Digoxin 0.25 Mg Tabs (Digoxin) .Marland Kitchen... Take 1 tablet by mouth once a day    Metoprolol Succinate 50 Mg Xr24h-tab (Metoprolol succinate) .Marland Kitchen... Take one tablet by mouth daily    Warfarin Sodium 10 Mg Tabs (Warfarin sodium) ..... Use as directed by anticoagulation clinic    Amiodarone Hcl 200 Mg Tabs (Amiodarone hcl) .Marland Kitchen... Take one tablet by mouth daily  Problem # 5:  MORBID OBESITY (ICD-278.01)  Problem # 6:  DIABETES MELLITUS, TYPE II (ICD-250.00)  Management per primary care. The following medications were removed from the medication list:    Metformin Hcl 1000 Mg Tabs (Metformin hcl) .Marland Kitchen... Take 1 by mouth two times a day qd His updated medication list for this problem includes:    Humalog Mix 75/25 75-25 % Susp (Insulin lispro prot & lispro) .Marland KitchenMarland KitchenMarland KitchenMarland Kitchen  85 units morning and 65 units at bedtime  The following medications were removed from the medication list:    Metformin Hcl 1000 Mg Tabs (Metformin hcl) .Marland Kitchen... Take 1 by mouth two times a day qd His updated medication list for this problem includes:    Humalog Mix 75/25 75-25 % Susp (Insulin lispro prot & lispro) .Marland KitchenMarland KitchenMarland KitchenMarland Kitchen 85 units morning and 65 units at bedtime  Problem # 7:  CHEST PAIN (ICD-786.50)  Symptoms atypical. No good way to evaluate. Continue present medications. His updated medication list for this problem includes:    Metoprolol Succinate 50 Mg Xr24h-tab (Metoprolol succinate) .Marland Kitchen... Take one tablet by mouth daily    Warfarin Sodium 10 Mg Tabs (Warfarin sodium) ..... Use as directed by anticoagulation clinic  His updated medication list for this problem includes:    Metoprolol Succinate 50 Mg Xr24h-tab (Metoprolol succinate) .Marland Kitchen... Take one tablet by mouth daily    Warfarin Sodium 10 Mg Tabs (Warfarin  sodium) ..... Use as directed by anticoagulation clinic  Problem # 8:  COUMADIN THERAPY (ICD-V58.61) Managed in the Coumadin clinic. Goal INR 2-3.  Other Orders: EKG w/ Interpretation (93000)  Patient Instructions: 1)  Your physician recommends that you schedule a follow-up appointment in: 6 months with Dr. Jens Som 2)  Your physician recommends that you continue on your current medications as directed. Please refer to the Current Medication list given to you today.

## 2010-11-01 NOTE — Letter (Signed)
Summary: [facility]oumadin  [address]   [place], [place] [address]   Phone: [phone]  Fax: [phone]     November 25, 2009 MRN: [id number]   [name] [address] [place], [place]  [address]   Dear [name],  This letter is being sent to you as a reminder that it is necessary for you to get your INR/PT checked regularly so that we can optimize your care.  Our records indicate that you were scheduled to have a test done recently.  As of today, we have not received the results of this test.  It is very important that you have your INR checked.  Please call our office at the number listed above to schedule an appointment at your earliest convenience.    If you have recently had your protime checked or have discontinued this medication, please contact our office at the above phone number to clarify this issue.  Thank you for this prompt attention to this important health care matter.  Sincerely,   [facility] Cardiovascular Risk Reduction Clinic Team

## 2010-11-01 NOTE — Medication Information (Signed)
Summary: Coumadin Clinic  Anticoagulant Therapy  Managed by: Cloyde Reams, RN, BSN Referring MD: Dr Jens Som PCP: Dr Mila Palmer Supervising MD: Tenny Craw MD, Gunnar Fusi Indication 1: Atrial Flutter Lab Used: [facility] Site: [address] PT 16.4 INR POC 1.6 INR RANGE 2.0-3.0    Bleeding/hemorrhagic complications: no     Any changes in medication regimen? no     Any missed doses?: no       Is patient compliant with meds? yes       Allergies (verified): 1)  ! Iodine 2)  ! * Shellfish  Anticoagulation Management History:      The patient is taking warfarin and comes in today for a routine follow up visit.  Positive risk factors for bleeding include presence of serious comorbidities.  Negative risk factors for bleeding include an age less than 1 years old.  The bleeding index is 'intermediate risk'.  Positive CHADS2 values include History of CHF, History of HTN, and History of Diabetes.  Negative CHADS2 values include Age > 48 years old.  Prothrombin time is 16.4.  Anticoagulation responsible provider: Tenny Craw MD, Gunnar Fusi.  INR POC: 1.6.  Exp: 07/2010.    Anticoagulation Management Assessment/Plan:      The patient's current anticoagulation dose is Warfarin sodium 10 mg tabs: Use as directed by Anticoagulation Clinic.  The target INR is 2.0-3.0.  The next INR is due 12/31/2009.  Anticoagulation instructions were given to home health nurse.  Results were reviewed/authorized by Cloyde Reams, RN, BSN.  He was notified by Cloyde Reams RN.         Prior Anticoagulation Instructions: INR 1.6  Spoke with Corrie Dandy, [facility] RN, advised to have pt resume his previous dosage of 15mg  daily except 10mg  on Sundays and Thursdays.  Recheck in 1 week.    Current Anticoagulation Instructions: INR 1.6  Spoke with Corrie Dandy, [facility] RN.  Advised to have pt take 20mg  today then start taking 15mg  daily except 10mg  on Thursdays.  Recheck in 1 week.

## 2010-11-01 NOTE — Letter (Signed)
Summary: Appointment - Missed  [facility]     [place], [place]    Phone:   Fax:      November 16, 2009 MRN: [id number]   [name] [address] [place], [place]  [address]   Dear [name],  Our records indicate you missed your appointment on  11-15-2009  with  Dr.  Jens Som It is very important that we reach you to reschedule this appointment. We look forward to participating in your health care needs. Please contact us at the number listed above at your earliest convenience to reschedule this appointment.     Sincerely,     Lorne Skeens   [facility] Scheduling Team

## 2010-11-01 NOTE — Progress Notes (Signed)
Summary: need to sched ov to discuss sleep study results  Phone Note Outgoing Call Call back at [facility] Phone [phone]   Call placed by: Arman Filter LPN,  October 18, 2009 4:30 PM Call placed to: Patient Summary of Call: pt's f/u appt on 10-12-2009 to discuss sleep study results was cancelled due to weather.  pt needs to reschedule appt.  LMOMTCBx1.  Arman Filter LPN  October 18, 2009 4:31 PM  Initial call taken by: Arman Filter LPN,  October 18, 2009 4:31 PM  Follow-up for Phone Call        [facility].  Aundra Millet Grigor Lipschutz LPN  October 20, 2009 3:49 PM   LMOMTCBx3. This is my 3rd attempt to contact pt.  Per protocol, will send a letter to pt to call our office to schedule f/u appt with KC.  Aundra Millet Jusitn Salsgiver LPN  October 22, 2009 4:06 PM

## 2010-11-01 NOTE — Consult Note (Signed)
Summary: Consultation Report - [facility]  Consultation Report - [facility]   Imported By: Marylou Mccoy 11/15/2009 09:48:20  _____________________________________________________________________  External Attachment:    Type:   Image     Comment:   External Document

## 2010-11-01 NOTE — Progress Notes (Signed)
Summary: Dental Procedure   Phone Note Other Incoming   Caller: pt Summary of Call: PT CALLED WAS AT DENTIST OFFICE NEEDING CLEARANCE FROM DR Rielly Corlett FOR PT TO HAVE  CLEANING AND OR FILLINGS IFNEEDED.INFORMED PT TO HAVE DENTIST OFFICE TO FAX OVER NOTE SAYING WHAT TYPE OF PROCEDURE PT  WAS HAVING DONE AND IF COUMADIN NEEDED TO BE HELD.VERBALIZED UNDERSTANIDING FAX NUMBER GIVEN TO PT. Initial call taken by: Scherrie Bateman, LPN,  May 02, 2010 5:00 PM

## 2010-11-01 NOTE — Letter (Signed)
Summary: [facility]  [address]   [place], [place] [address]   Phone: [phone]  Fax: [phone]    10/22/2009  KENZO OZMENT [address] [place], [place]  [address]  Dear Mr. Boven,  We have attempted to contact you by phone several times but have been unsuccessful.  Please call our office at your earliest convenience so that we may schedule you a follow up appointment with Dr. Shelle Iron to discuss your test results.  Thank you.           Sincerely,   Marcelyn Bruins, M.D.

## 2010-11-01 NOTE — Consult Note (Signed)
Summary: [facility]   Imported By: Harlon Flor 06/20/2010 09:01:17  _____________________________________________________________________  External Attachment:    Type:   Image     Comment:   External Document

## 2010-11-01 NOTE — Letter (Signed)
Summary: Denied DME/[facility]  Denied DME/[facility]   Imported By: Lester Gramercy 08/31/2010 07:51:05  _____________________________________________________________________  External Attachment:    Type:   Image     Comment:   External Document

## 2010-11-01 NOTE — Letter (Signed)
Summary: Appointment - Missed  [facility] Main [facility]  [address]   [place], [place] [address]   Phone: [phone]  Fax: [phone]     June 24, 2010 MRN: [id number]   [facility]   [place], [place]  [address]   Dear Mr. Chestine Spore,  Our records indicate you missed your appointment on  06-03-2010   with  Dr.   Jens Som . It is very important that we reach you to reschedule this appointment. We look forward to participating in your health care needs. Please contact us at the number listed above at your earliest convenience to reschedule this appointment.     Sincerely,      Lorne Skeens  [facility] Scheduling Team

## 2010-11-01 NOTE — Progress Notes (Signed)
   Phone Note From Other Clinic   Caller: Sue[facility]  Initial call taken by: Km    Faxed All Cardiac over to [phone] [facility]  March 02, 2010 1:58 PM

## 2010-11-01 NOTE — Letter (Signed)
Summary: [facility]   Imported By: Marylou Mccoy 07/14/2010 13:39:47  _____________________________________________________________________  External Attachment:    Type:   Image     Comment:   External Document

## 2010-11-01 NOTE — Miscellaneous (Signed)
  Clinical Lists Changes 

## 2010-11-03 NOTE — Medication Information (Signed)
Summary: rov/mwb  Anticoagulant Therapy  Managed by: Bethena Midget, RN, BSN Referring MD: Dr Jens Som PCP: Dr Mila Palmer Supervising MD: Daleen Squibb MD, Maisie Fus Indication 1: Atrial Flutter Lab Used: [facility] [place] Site: [address] INR POC 2.7 INR RANGE 2.0-3.0  Dietary changes: no    Health status changes: no    Bleeding/hemorrhagic complications: no    Recent/future hospitalizations: no    Any changes in medication regimen? yes       Details: Lisinopril increased form 10mg s to 20mg s   Recent/future dental: no  Any missed doses?: no       Is patient compliant with meds? yes       Allergies: 1)  ! Iodine 2)  ! * Shellfish  Anticoagulation Management History:      The patient is taking warfarin and comes in today for a routine follow up visit.  Positive risk factors for bleeding include presence of serious comorbidities.  Negative risk factors for bleeding include an age less than 60 years old.  The bleeding index is 'intermediate risk'.  Positive CHADS2 values include History of CHF, History of HTN, and History of Diabetes.  Negative CHADS2 values include Age > 25 years old.  His last INR was 1.1.  Anticoagulation responsible provider: Daleen Squibb MD, Maisie Fus.  INR POC: 2.7.  Cuvette Lot#: [id number].  Exp: 11/2011.    Anticoagulation Management Assessment/Plan:      The patient's current anticoagulation dose is Warfarin sodium 10 mg tabs: Use as directed by Anticoagulation Clinic.  The target INR is 2.0-3.0.  The next INR is due 10/07/2010.  Anticoagulation instructions were given to patient.  Results were reviewed/authorized by Bethena Midget, RN, BSN.  He was notified by Bethena Midget, RN, BSN.         Prior Anticoagulation Instructions: INR 1.1 Take 1 tablet (10mg ) on Sun, Tue, Thu, and Sat Take 1.5 tablet (15mg ) on Mon, Wed, and Friday recheck INR next week  Current Anticoagulation Instructions: INR 2.7 Continue 10mg s everyday except 15mg s on Mondays,  Wednesdays and Fridays. Recheck in 2-3 weeks.

## 2010-11-03 NOTE — Progress Notes (Signed)
Summary: Coumadin follow-up  Phone Note Outgoing Call   Call placed by: Cloyde Reams RN,  October 24, 2010 4:27 PM Call placed to: Patient Summary of Call: Called spoke with pt.  Pt is overdue for Coumadin follow-up.  Pt states he is trying to get his insurance straightened out.  Pt states he should have this ironed out next week and he will call back for f/u Coumadin appt.  Pt is aware of risks associated with taking Coumadin and not following up appropriately.  Pt is aware of risks associated with delay in f/u.  Pt will call next week to schedule f/u appointment.  Initial call taken by: Cloyde Reams RN,  October 24, 2010 4:30 PM

## 2010-11-03 NOTE — Medication Information (Signed)
Summary: rov/sp   Anticoagulant Therapy  Managed by: Bethena Midget, RN, BSN Referring MD: Dr Jens Som PCP: Dr Mila Palmer Supervising MD: Gala Romney MD, Reuel Boom Indication 1: Atrial Flutter Lab Used: [facility] Site: [address] INR RANGE 2.0-3.0  Dietary changes: no    Health status changes: no    Bleeding/hemorrhagic complications: no    Recent/future hospitalizations: no    Any changes in medication regimen? no    Recent/future dental: no  Any missed doses?: yes     Details: pt run out of prescription and haven't been taking it for a week  Is patient compliant with meds? no       Allergies: 1)  ! Iodine 2)  ! * Shellfish  Anticoagulation Management History:      Positive risk factors for bleeding include presence of serious comorbidities.  Negative risk factors for bleeding include an age less than 79 years old.  The bleeding index is 'intermediate risk'.  Positive CHADS2 values include History of CHF, History of HTN, and History of Diabetes.  Negative CHADS2 values include Age > 79 years old.  Today's INR is 1.1.  Anticoagulation responsible Vincy Feliz: Bensimhon MD, Reuel Boom.  Exp: 06/2011.    Anticoagulation Management Assessment/Plan:      The patient's current anticoagulation dose is Warfarin sodium 10 mg tabs: Use as directed by Anticoagulation Clinic.  The target INR is 2.0-3.0.  The next INR is due 09/20/2010.  Anticoagulation instructions were given to patient.  Results were reviewed/authorized by Bethena Midget, RN, BSN.         Prior Anticoagulation Instructions: INR 2.5 Continue 10mg s everyday except 15mg s on Mondays, Wednesdays and Fridays. Recheck in 4 weeks.   Current Anticoagulation Instructions: INR 1.1 Take 1 tablet (10mg ) on Sun, Tue, Thu, and Sat Take 1.5 tablet (15mg ) on Mon, Wed, and Friday recheck INR next week Prescriptions: WARFARIN SODIUM 10 MG TABS (WARFARIN SODIUM) Use as directed by Anticoagulation Clinic  #50 x 1   Entered  by:   Weston Brass PharmD   Authorized by:   Ferman Hamming, MD, [facility]   Signed by:   Weston Brass PharmD on 09/13/2010   Method used:   Electronically to        [id number] (retail)       [address] [place], [place]  [address]       Ph: [phone]       Fax: [phone]   RxID:   [id number]

## 2010-11-03 NOTE — Assessment & Plan Note (Signed)
Summary: follow up/mt  Medications Added PRAVASTATIN SODIUM 20 MG TABS (PRAVASTATIN SODIUM) Take one tablet by mouth daily at bedtime LISINOPRIL 20 MG TABS (LISINOPRIL) Take one tablet by mouth daily        Referring Provider:  Jens Som Primary Provider:  Dr Mila Palmer   History of Present Illness: Pleasant gentleman with paoxysmal  atrial fibrillation.  Previous TSH was normal.  He was previously admitted to [facility] with atrial flutter. He underwent a TEE guided cardioversion 10/10 successfully. Note the TEE revealed normal LV and RV function. He was seen by EP and amiodarone was recommended to help maintain sinus rhythm. Multiple previous admissions for atypical chest pain. He has ruled out for myocardial infarction. A d-dimer was normal. A VQ scan was technically difficult but low probability. An echocardiogram in Feb of 2011 was technically difficult but showed normal LV function. I last saw him in May of 2011. He since has been seen at [facility] in September of 2011 for chest pain. Apparently an echocardiogram showed normal LV function. They felt his symptoms were extremely atypical. They did not recommend stress testing and felt he was too large for their cath table (450 lb limit) regardless. Since then the patient has dyspnea with  activities. It is relieved with rest. It is not associated with chest pain. There is no orthopnea, PND; mild pedal edema is. There is no syncope or palpitations. There is no exertional chest pain.   Current Medications (verified): 1)  Humalog Mix 75/25 75-25 %  Susp (Insulin Lispro Prot & Lispro) .... 85 Units Morning and 65 Units At Bedtime 2)  Ibuprofen 200 Mg  Tabs (Ibuprofen) .... Take 1 By Mouth Prn 3)  Digoxin 0.25 Mg Tabs (Digoxin) .... Take 1 Tablet By Mouth Once A Day 4)  Ventolin Hfa 108 (90 Base) Mcg/act Aers (Albuterol Sulfate) .... As Directed 5)  Metoprolol Succinate 50 Mg Xr24h-Tab (Metoprolol Succinate) .... Take One  Tablet By Mouth Daily 6)  Furosemide 40 Mg Tabs (Furosemide) .Marland Kitchen.. 1 Tab By Mouth Two Times A Day 7)  Warfarin Sodium 10 Mg Tabs (Warfarin Sodium) .... Use As Directed By Anticoagulation Clinic 8)  Amiodarone Hcl 200 Mg Tabs (Amiodarone Hcl) .... Take One Tablet By Mouth Daily 9)  Pravastatin Sodium (Pravastatin Sodium) .... Take One Tablet By Mouth Daily At Bedtime 10)  Lisinopril (Lisinopril) .... Take One Tablet By Mouth Daily  Allergies: 1)  ! Iodine 2)  ! * Shellfish  Past History:  Past Medical History: Reviewed history from 02/07/2010 and no changes required. CHF (ICD-428.0) HYPERTENSION (ICD-401.9) PTOSIS (ICD-374.30) Atrial flutter ATRIAL FIBRILLATION (ICD-427.31) MORBID OBESITY (ICD-278.01) DIABETES MELLITUS, TYPE II (ICD-250.00) DEPRESSION (ICD-311) RESPIRATORY FAILURE (ICD-518.81) Chronic low back pain.   Past Surgical History: Reviewed history from 06/02/2009 and no changes required.  Tonsillectomy  Social History: Reviewed history from 06/02/2009 and no changes required.  The patient lives in [place] alone.  He works full-   time for [employer].  He has no significant tobacco, EtOH, or illicit drug use   history.  He does occasionally take Echinacea and B12 supplements.  He   has a regular diet and has not been regularly exercising over the last 3   months.      Review of Systems       Some depression but no fevers or chills, productive cough, hemoptysis, dysphasia, odynophagia, melena, hematochezia, dysuria, hematuria, rash, seizure activity, orthopnea, PND,  claudication. Remaining systems are negative.   Vital Signs:  Patient  profile:   40 year old male Height:      76 inches Weight:      609 pounds BMI:     74.40 Pulse rate:   81 / minute Resp:     14 per minute BP sitting:   179 / 100  (left arm)  Vitals Entered By: Kem Parkinson (September 13, 2010 4:11 PM)  Physical Exam  General:  Well-developed morbidly obese in no acute distress.  Skin  is warm and dry.  HEENT is normal.  Neck is supple. No thyromegaly.  Chest is clear to auscultation with normal expansion.  Cardiovascular exam is regular rate and rhythm.  Abdominal exam nontender or distended. No masses palpated. Extremities show trace edema. neuro grossly intact    EKG  Procedure date:  09/13/2010  Findings:      Sinus rhythm, cannot rule out prior septal infarct.  Impression & Recommendations:  Problem # 1:  COUMADIN THERAPY (ICD-V58.61) Goal INR 2-3. Check CBC.  Problem # 2:  OBSTRUCTIVE SLEEP APNEA (ICD-327.23)  Problem # 3:  HYPERTENSION (ICD-401.9) Blood pressure elevated. Increase lisinopril to 20 mg p.o. daily. Check potassium and renal function in one week. His updated medication list for this problem includes:    Metoprolol Succinate 50 Mg Xr24h-tab (Metoprolol succinate) .Marland Kitchen... Take one tablet by mouth daily    Furosemide 40 Mg Tabs (Furosemide) .Marland Kitchen... 1 tab by mouth two times a day    Lisinopril 20 Mg Tabs (Lisinopril) .Marland Kitchen... Take one tablet by mouth daily  Problem # 4:  ATRIAL FLUTTER (ICD-427.32) Continue amiodarone, digoxin and beta blocker. Continue Coumadin. Check TSH, liver functions and chest x-ray given amiodarone use. Patient remains in sinus rhythm at present. His updated medication list for this problem includes:    Digoxin 0.25 Mg Tabs (Digoxin) .Marland Kitchen... Take 1 tablet by mouth once a day    Metoprolol Succinate 50 Mg Xr24h-tab (Metoprolol succinate) .Marland Kitchen... Take one tablet by mouth daily    Warfarin Sodium 10 Mg Tabs (Warfarin sodium) ..... Use as directed by anticoagulation clinic    Amiodarone Hcl 200 Mg Tabs (Amiodarone hcl) .Marland Kitchen... Take one tablet by mouth daily  His updated medication list for this problem includes:    Digoxin 0.25 Mg Tabs (Digoxin) .Marland Kitchen... Take 1 tablet by mouth once a day    Metoprolol Succinate 50 Mg Xr24h-tab (Metoprolol succinate) .Marland Kitchen... Take one tablet by mouth daily    Warfarin Sodium 10 Mg Tabs (Warfarin sodium)  ..... Use as directed by anticoagulation clinic    Amiodarone Hcl 200 Mg Tabs (Amiodarone hcl) .Marland Kitchen... Take one tablet by mouth daily  Problem # 5:  ATRIAL FIBRILLATION (ICD-427.31) As per #4. His updated medication list for this problem includes:    Digoxin 0.25 Mg Tabs (Digoxin) .Marland Kitchen... Take 1 tablet by mouth once a day    Metoprolol Succinate 50 Mg Xr24h-tab (Metoprolol succinate) .Marland Kitchen... Take one tablet by mouth daily    Warfarin Sodium 10 Mg Tabs (Warfarin sodium) ..... Use as directed by anticoagulation clinic    Amiodarone Hcl 200 Mg Tabs (Amiodarone hcl) .Marland Kitchen... Take one tablet by mouth daily  Problem # 6:  MORBID OBESITY (ICD-278.01) Pt counseled on wt loss.  Problem # 7:  DIABETES MELLITUS, TYPE II (ICD-250.00)  His updated medication list for this problem includes:    Humalog Mix 75/25 75-25 % Susp (Insulin lispro prot & lispro) .Marland KitchenMarland KitchenMarland KitchenMarland Kitchen 85 units morning and 65 units at bedtime    Lisinopril 20 Mg Tabs (Lisinopril) .Marland Kitchen... Take one  tablet by mouth daily  Problem # 8:  CHEST PAIN (ICD-786.50) No recent chest pain; given wt of 609 lbs, not a candidate for further evaluation (too large for cath, CT, nuclear study; images not adequate for dobutamine echo). His updated medication list for this problem includes:    Metoprolol Succinate 50 Mg Xr24h-tab (Metoprolol succinate) .Marland Kitchen... Take one tablet by mouth daily    Warfarin Sodium 10 Mg Tabs (Warfarin sodium) ..... Use as directed by anticoagulation clinic    Lisinopril 20 Mg Tabs (Lisinopril) .Marland Kitchen... Take one tablet by mouth daily  Patient Instructions: 1)  Your physician wants you to follow-up in: 6 MONTHS  You will receive a reminder letter in the mail two months in advance. If you don't receive a letter, please call our office to schedule the follow-up appointment. 2)  Your physician has recommended you make the following change in your medication: INCREASE LISINOPRIL 20MG  ONCE DAILY Prescriptions: LISINOPRIL 20 MG TABS (LISINOPRIL) Take one  tablet by mouth daily  #30 x 12   Entered by:   Deliah Goody, RN   Authorized by:   Ferman Hamming, MD, [facility]   Signed by:   Deliah Goody, RN on 09/13/2010   Method used:   Electronically to        [facility] # [phone]* (retail)       [address] [place], [place]  [address]       Ph: [phone]       Fax: [phone]   RxID:   [id number]

## 2010-11-14 DIAGNOSIS — I4892 Unspecified atrial flutter: Secondary | ICD-10-CM

## 2010-11-14 DIAGNOSIS — I4891 Unspecified atrial fibrillation: Secondary | ICD-10-CM

## 2010-11-14 DIAGNOSIS — Z7901 Long term (current) use of anticoagulants: Secondary | ICD-10-CM

## 2010-11-17 ENCOUNTER — Encounter: Payer: Self-pay | Admitting: Cardiology

## 2010-12-14 LAB — BASIC METABOLIC PANEL
BUN: 15 mg/dL (ref 6–23)
CO2: 29 mEq/L (ref 19–32)
Calcium: 9.1 mg/dL (ref 8.4–10.5)
Chloride: 103 mEq/L (ref 96–112)
Creatinine, Ser: 0.91 mg/dL (ref 0.4–1.5)
GFR calc Af Amer: >60 mL/min (ref >60)
GFR calc non Af Amer: >60 mL/min (ref >60)
Glucose, Bld: 294 mg/dL — ABNORMAL HIGH (ref 70–99)
Potassium: 4.9 mEq/L (ref 3.5–5.1)
Sodium: 141 mEq/L (ref 135–145)

## 2010-12-14 LAB — DIFFERENTIAL
Basophils Absolute: 0 10*3/uL (ref 0.0–0.1)
Eosinophils Absolute: 0.2 10*3/uL (ref 0.0–0.7)
Eosinophils Relative: 2 % (ref 0–5)
Lymphocytes Relative: 20 % (ref 12–46)

## 2010-12-14 LAB — POCT CARDIAC MARKERS
CKMB, poc: <1 ng/mL — ABNORMAL LOW (ref 1.0–8.0)
Myoglobin, poc: 46.1 ng/mL (ref 12–200)
Myoglobin, poc: 48.5 ng/mL (ref 12–200)

## 2010-12-14 LAB — CBC
HCT: 37.9 % — ABNORMAL LOW (ref 39.0–52.0)
Hemoglobin: 12.5 g/dL — ABNORMAL LOW (ref 13.0–17.0)
MCH: 27.3 pg (ref 26.0–34.0)
RBC: 4.58 MIL/uL (ref 4.22–5.81)
WBC: 11.8 10*3/uL — ABNORMAL HIGH (ref 4.0–10.5)

## 2010-12-14 LAB — PROTIME-INR: Prothrombin Time: 12.9 seconds (ref 11.6–15.2)

## 2010-12-17 LAB — DIFFERENTIAL
Basophils Absolute: 0.1 10*3/uL (ref 0.0–0.1)
Lymphocytes Relative: 23 % (ref 12–46)
Neutro Abs: 7.5 10*3/uL (ref 1.7–7.7)

## 2010-12-17 LAB — CBC
Hemoglobin: 14.2 g/dL (ref 13.0–17.0)
MCH: 28 pg (ref 26.0–34.0)
Platelets: 193 10*3/uL (ref 150–400)
Platelets: 205 10*3/uL (ref 150–400)
RBC: 5.07 MIL/uL (ref 4.22–5.81)
RDW: 15.6 % — ABNORMAL HIGH (ref 11.5–15.5)
WBC: 11.2 10*3/uL — ABNORMAL HIGH (ref 4.0–10.5)
WBC: 11.4 10*3/uL — ABNORMAL HIGH (ref 4.0–10.5)

## 2010-12-17 LAB — BASIC METABOLIC PANEL WITH GFR
BUN: 9 mg/dL (ref 6–23)
CO2: 31 meq/L (ref 19–32)
Calcium: 8.9 mg/dL (ref 8.4–10.5)
Chloride: 105 meq/L (ref 96–112)
Creatinine, Ser: 0.85 mg/dL (ref 0.4–1.5)
GFR calc Af Amer: >60 mL/min (ref >60)
GFR calc non Af Amer: >60 mL/min (ref >60)
Glucose, Bld: 52 mg/dL — ABNORMAL LOW (ref 70–99)
Potassium: 3.3 meq/L — ABNORMAL LOW (ref 3.5–5.1)
Sodium: 142 meq/L (ref 135–145)

## 2010-12-17 LAB — COMPREHENSIVE METABOLIC PANEL
ALT: 12 U/L (ref 0–53)
AST: 19 U/L (ref 0–37)
Albumin: 3.7 g/dL (ref 3.5–5.2)
Alkaline Phosphatase: 61 U/L (ref 39–117)
CO2: 29 mEq/L (ref 19–32)
Chloride: 100 mEq/L (ref 96–112)
Creatinine, Ser: 0.91 mg/dL (ref 0.4–1.5)
GFR calc Af Amer: >60 mL/min (ref >60)
GFR calc non Af Amer: >60 mL/min (ref >60)
Potassium: 3.8 mEq/L (ref 3.5–5.1)
Sodium: 140 mEq/L (ref 135–145)
Total Bilirubin: 0.5 mg/dL (ref 0.3–1.2)

## 2010-12-17 LAB — CARDIAC PANEL(CRET KIN+CKTOT+MB+TROPI)
CK, MB: 1.7 ng/mL (ref 0.3–4.0)
CK, MB: 4 ng/mL (ref 0.3–4.0)
Relative Index: 3.4 — ABNORMAL HIGH (ref 0.0–2.5)
Total CK: 119 U/L (ref 7–232)
Troponin I: 0.01 ng/mL (ref 0.00–0.06)
Troponin I: 0.02 ng/mL (ref 0.00–0.06)

## 2010-12-17 LAB — LIPID PANEL
Triglycerides: 173 mg/dL — ABNORMAL HIGH (ref <150)
VLDL: 35 mg/dL (ref 0–40)

## 2010-12-17 LAB — GLUCOSE, CAPILLARY
Glucose-Capillary: 100 mg/dL — ABNORMAL HIGH (ref 70–99)
Glucose-Capillary: 107 mg/dL — ABNORMAL HIGH (ref 70–99)
Glucose-Capillary: 47 mg/dL — ABNORMAL LOW (ref 70–99)
Glucose-Capillary: 62 mg/dL — ABNORMAL LOW (ref 70–99)
Glucose-Capillary: 72 mg/dL (ref 70–99)

## 2010-12-17 LAB — HEPATIC FUNCTION PANEL
ALT: 12 U/L (ref 0–53)
AST: 21 U/L (ref 0–37)
Alkaline Phosphatase: 59 U/L (ref 39–117)
Indirect Bilirubin: 0.6 mg/dL (ref 0.3–0.9)
Total Protein: 6.9 g/dL (ref 6.0–8.3)

## 2010-12-17 LAB — CK TOTAL AND CKMB (NOT AT ARMC)
CK, MB: 1.9 ng/mL (ref 0.3–4.0)
Relative Index: 1.7 (ref 0.0–2.5)
Total CK: 109 U/L (ref 7–232)

## 2010-12-17 LAB — PROTIME-INR
INR: 1.57 — ABNORMAL HIGH (ref 0.00–1.49)
Prothrombin Time: 17 seconds — ABNORMAL HIGH (ref 11.6–15.2)
Prothrombin Time: 18.6 seconds — ABNORMAL HIGH (ref 11.6–15.2)

## 2010-12-17 LAB — TSH: TSH: 3.045 u[IU]/mL (ref 0.350–4.500)

## 2010-12-17 LAB — APTT: aPTT: 34 s (ref 24–37)

## 2010-12-17 LAB — POCT CARDIAC MARKERS
CKMB, poc: 1.4 ng/mL (ref 1.0–8.0)
Myoglobin, poc: 88.5 ng/mL (ref 12–200)
Troponin i, poc: <0.05 ng/mL (ref 0.00–0.09)

## 2010-12-17 LAB — D-DIMER, QUANTITATIVE: D-Dimer, Quant: <0.22 ug{FEU}/mL (ref 0.00–0.48)

## 2010-12-17 LAB — TROPONIN I: Troponin I: 0.01 ng/mL (ref 0.00–0.06)

## 2010-12-17 LAB — HEMOGLOBIN A1C: Mean Plasma Glucose: 163 mg/dL — ABNORMAL HIGH (ref <117)

## 2010-12-20 LAB — D-DIMER, QUANTITATIVE: D-Dimer, Quant: 0.28 ug/mL-FEU (ref 0.00–0.48)

## 2010-12-20 LAB — POCT CARDIAC MARKERS
CKMB, poc: <1 ng/mL — ABNORMAL LOW (ref 1.0–8.0)
Troponin i, poc: <0.05 ng/mL (ref 0.00–0.09)

## 2010-12-20 LAB — CBC
HCT: 43.6 % (ref 39.0–52.0)
Platelets: 260 10*3/uL (ref 150–400)
RBC: 5.29 MIL/uL (ref 4.22–5.81)
WBC: 14.8 10*3/uL — ABNORMAL HIGH (ref 4.0–10.5)

## 2010-12-20 LAB — BASIC METABOLIC PANEL
BUN: 19 mg/dL (ref 6–23)
Calcium: 9.2 mg/dL (ref 8.4–10.5)
GFR calc non Af Amer: >60 mL/min (ref >60)
Glucose, Bld: 266 mg/dL — ABNORMAL HIGH (ref 70–99)

## 2010-12-20 NOTE — Letter (Signed)
Summary: [facility]/ROI Form   [facility]/ROI Form   Imported By: Roderic Ovens 12/12/2010 13:47:48  _____________________________________________________________________  External Attachment:    Type:   Image     Comment:   External Document

## 2010-12-21 LAB — APTT: aPTT: 33 seconds (ref 24–37)

## 2010-12-21 LAB — CBC
HCT: 37.7 % — ABNORMAL LOW (ref 39.0–52.0)
HCT: 40.5 % (ref 39.0–52.0)
HCT: 45.6 % (ref 39.0–52.0)
Hemoglobin: 12.7 g/dL — ABNORMAL LOW (ref 13.0–17.0)
Hemoglobin: 13.3 g/dL (ref 13.0–17.0)
MCHC: 33.7 g/dL (ref 30.0–36.0)
MCHC: 32.8 g/dL (ref 30.0–36.0)
MCV: 81.8 fL (ref 78.0–100.0)
MCV: 81.9 fL (ref 78.0–100.0)
MCV: 82.1 fL (ref 78.0–100.0)
MCV: 82.2 fL (ref 78.0–100.0)
Platelets: 246 10*3/uL (ref 150–400)
Platelets: 260 10*3/uL (ref 150–400)
Platelets: 266 10*3/uL (ref 150–400)
RBC: 4.91 MIL/uL (ref 4.22–5.81)
RBC: 5.38 MIL/uL (ref 4.22–5.81)
RDW: 18.6 % — ABNORMAL HIGH (ref 11.5–15.5)
RDW: 17.8 % — ABNORMAL HIGH (ref 11.5–15.5)
RDW: 18 % — ABNORMAL HIGH (ref 11.5–15.5)
RDW: 18.6 % — ABNORMAL HIGH (ref 11.5–15.5)
WBC: 12 10*3/uL — ABNORMAL HIGH (ref 4.0–10.5)
WBC: 13.5 10*3/uL — ABNORMAL HIGH (ref 4.0–10.5)

## 2010-12-21 LAB — BASIC METABOLIC PANEL
BUN: 10 mg/dL (ref 6–23)
CO2: 35 mEq/L — ABNORMAL HIGH (ref 19–32)
Calcium: 8.5 mg/dL (ref 8.4–10.5)
Calcium: 8.7 mg/dL (ref 8.4–10.5)
Chloride: 106 mEq/L (ref 96–112)
Creatinine, Ser: 0.96 mg/dL (ref 0.4–1.5)
GFR calc Af Amer: >60 mL/min (ref >60)
GFR calc non Af Amer: >60 mL/min (ref >60)
Potassium: 4.6 mEq/L (ref 3.5–5.1)
Sodium: 150 mEq/L — ABNORMAL HIGH (ref 135–145)

## 2010-12-21 LAB — COMPREHENSIVE METABOLIC PANEL
ALT: 18 U/L (ref 0–53)
AST: 43 U/L — ABNORMAL HIGH (ref 0–37)
Albumin: 2.7 g/dL — ABNORMAL LOW (ref 3.5–5.2)
Alkaline Phosphatase: 52 U/L (ref 39–117)
Alkaline Phosphatase: 54 U/L (ref 39–117)
BUN: 10 mg/dL (ref 6–23)
BUN: 34 mg/dL — ABNORMAL HIGH (ref 6–23)
CO2: 35 mEq/L — ABNORMAL HIGH (ref 19–32)
Calcium: 8 mg/dL — ABNORMAL LOW (ref 8.4–10.5)
Chloride: 110 mEq/L (ref 96–112)
Creatinine, Ser: 0.85 mg/dL (ref 0.4–1.5)
Creatinine, Ser: 1.37 mg/dL (ref 0.4–1.5)
Creatinine, Ser: 1.72 mg/dL — ABNORMAL HIGH (ref 0.4–1.5)
GFR calc Af Amer: >60 mL/min (ref >60)
GFR calc non Af Amer: 58 mL/min — ABNORMAL LOW (ref >60)
Glucose, Bld: 78 mg/dL (ref 70–99)
Potassium: 4.3 mEq/L (ref 3.5–5.1)
Total Bilirubin: 1.1 mg/dL (ref 0.3–1.2)
Total Protein: 6.8 g/dL (ref 6.0–8.3)
Total Protein: 7.7 g/dL (ref 6.0–8.3)

## 2010-12-21 LAB — GLUCOSE, CAPILLARY
Glucose-Capillary: 101 mg/dL — ABNORMAL HIGH (ref 70–99)
Glucose-Capillary: 97 mg/dL (ref 70–99)
Glucose-Capillary: 113 mg/dL — ABNORMAL HIGH (ref 70–99)
Glucose-Capillary: 115 mg/dL — ABNORMAL HIGH (ref 70–99)
Glucose-Capillary: 122 mg/dL — ABNORMAL HIGH (ref 70–99)
Glucose-Capillary: 127 mg/dL — ABNORMAL HIGH (ref 70–99)
Glucose-Capillary: 132 mg/dL — ABNORMAL HIGH (ref 70–99)
Glucose-Capillary: 134 mg/dL — ABNORMAL HIGH (ref 70–99)
Glucose-Capillary: 145 mg/dL — ABNORMAL HIGH (ref 70–99)
Glucose-Capillary: 161 mg/dL — ABNORMAL HIGH (ref 70–99)
Glucose-Capillary: 163 mg/dL — ABNORMAL HIGH (ref 70–99)
Glucose-Capillary: 171 mg/dL — ABNORMAL HIGH (ref 70–99)
Glucose-Capillary: 199 mg/dL — ABNORMAL HIGH (ref 70–99)
Glucose-Capillary: 201 mg/dL — ABNORMAL HIGH (ref 70–99)
Glucose-Capillary: 209 mg/dL — ABNORMAL HIGH (ref 70–99)
Glucose-Capillary: 227 mg/dL — ABNORMAL HIGH (ref 70–99)
Glucose-Capillary: 285 mg/dL — ABNORMAL HIGH (ref 70–99)
Glucose-Capillary: 291 mg/dL — ABNORMAL HIGH (ref 70–99)
Glucose-Capillary: 294 mg/dL — ABNORMAL HIGH (ref 70–99)
Glucose-Capillary: 481 mg/dL — ABNORMAL HIGH (ref 70–99)
Glucose-Capillary: 593 mg/dL (ref 70–99)
Glucose-Capillary: 96 mg/dL (ref 70–99)

## 2010-12-21 LAB — PROTIME-INR
INR: 2.96 — ABNORMAL HIGH (ref 0.00–1.49)
INR: 1.43 (ref 0.00–1.49)
INR: 1.46 (ref 0.00–1.49)
INR: 1.7 — ABNORMAL HIGH (ref 0.00–1.49)
INR: 1.95 — ABNORMAL HIGH (ref 0.00–1.49)
INR: 2.04 — ABNORMAL HIGH (ref 0.00–1.49)
INR: 2.38 — ABNORMAL HIGH (ref 0.00–1.49)
Prothrombin Time: 30 seconds — ABNORMAL HIGH (ref 11.6–15.2)
Prothrombin Time: 30.6 seconds — ABNORMAL HIGH (ref 11.6–15.2)
Prothrombin Time: 19.8 seconds — ABNORMAL HIGH (ref 11.6–15.2)
Prothrombin Time: 22.1 seconds — ABNORMAL HIGH (ref 11.6–15.2)
Prothrombin Time: 22.9 seconds — ABNORMAL HIGH (ref 11.6–15.2)

## 2010-12-21 LAB — CARDIAC PANEL(CRET KIN+CKTOT+MB+TROPI)
CK, MB: 2.4 ng/mL (ref 0.3–4.0)
Relative Index: 0.3 (ref 0.0–2.5)
Total CK: 925 U/L — ABNORMAL HIGH (ref 7–232)
Total CK: 925 U/L — ABNORMAL HIGH (ref 7–232)
Troponin I: 0.28 ng/mL — ABNORMAL HIGH (ref 0.00–0.06)
Troponin I: 0.4 ng/mL — ABNORMAL HIGH (ref 0.00–0.06)

## 2010-12-21 LAB — URINE CULTURE: Culture: NO GROWTH

## 2010-12-21 LAB — DIFFERENTIAL
Basophils Absolute: 0.2 10*3/uL — ABNORMAL HIGH (ref 0.0–0.1)
Basophils Relative: 1 % (ref 0–1)
Eosinophils Relative: 0 % (ref 0–5)
Monocytes Absolute: 0.6 10*3/uL (ref 0.1–1.0)
Neutro Abs: 12.1 10*3/uL — ABNORMAL HIGH (ref 1.7–7.7)

## 2010-12-21 LAB — CK TOTAL AND CKMB (NOT AT ARMC): Relative Index: 0.2 (ref 0.0–2.5)

## 2010-12-21 LAB — LIPID PANEL: Triglycerides: 189 mg/dL — ABNORMAL HIGH (ref <150)

## 2010-12-21 LAB — URINE MICROSCOPIC-ADD ON

## 2010-12-21 LAB — URINALYSIS, ROUTINE W REFLEX MICROSCOPIC
Protein, ur: NEGATIVE mg/dL
Urobilinogen, UA: 0.2 mg/dL (ref 0.0–1.0)

## 2010-12-21 LAB — TROPONIN I: Troponin I: 0.4 ng/mL — ABNORMAL HIGH (ref 0.00–0.06)

## 2010-12-21 LAB — DIGOXIN LEVEL: Digoxin Level: <0.2 ng/mL — ABNORMAL LOW (ref 0.8–2.0)

## 2010-12-21 LAB — KETONES, QUALITATIVE: Acetone, Bld: NEGATIVE

## 2010-12-21 LAB — POCT CARDIAC MARKERS

## 2010-12-21 LAB — CULTURE, BLOOD (ROUTINE X 2)

## 2010-12-22 ENCOUNTER — Ambulatory Visit (INDEPENDENT_AMBULATORY_CARE_PROVIDER_SITE_OTHER): Payer: PRIVATE HEALTH INSURANCE | Admitting: *Deleted

## 2010-12-22 ENCOUNTER — Other Ambulatory Visit: Payer: Self-pay | Admitting: Cardiology

## 2010-12-22 ENCOUNTER — Telehealth: Payer: Self-pay | Admitting: *Deleted

## 2010-12-22 DIAGNOSIS — I4891 Unspecified atrial fibrillation: Secondary | ICD-10-CM

## 2010-12-22 DIAGNOSIS — I4892 Unspecified atrial flutter: Secondary | ICD-10-CM

## 2010-12-22 DIAGNOSIS — Z7901 Long term (current) use of anticoagulants: Secondary | ICD-10-CM

## 2010-12-22 DIAGNOSIS — Z79899 Other long term (current) drug therapy: Secondary | ICD-10-CM

## 2010-12-22 NOTE — Patient Instructions (Signed)
INR 3.7 Skip tomorrow's dose then resume 1 pill everyday except 1.5 pills on Mondays, Wednesdays and Fridays. Recheck in 2 weeks.

## 2010-12-22 NOTE — Progress Notes (Signed)
Pt in for cvrr today he reports that Dr Jens Som wanted him to have labs and xray in Dec. But he has not had insurance, he now has insurance and would like to get testing done, orders placed he will go to [place] office on Monday for lfts, tsh and chest xray per Dr Ludwig Clarks ov note 09/13/10

## 2010-12-22 NOTE — Telephone Encounter (Signed)
A user error has taken place: encounter opened in error, closed for administrative reasons.

## 2010-12-23 ENCOUNTER — Encounter: Payer: Self-pay | Admitting: *Deleted

## 2010-12-25 LAB — CBC
HCT: 39.4 % (ref 39.0–52.0)
Hemoglobin: 12.8 g/dL — ABNORMAL LOW (ref 13.0–17.0)
Hemoglobin: 13.2 g/dL (ref 13.0–17.0)
MCHC: 33.2 g/dL (ref 30.0–36.0)
MCV: 81.4 fL (ref 78.0–100.0)
RDW: 18.2 % — ABNORMAL HIGH (ref 11.5–15.5)
WBC: 12.5 10*3/uL — ABNORMAL HIGH (ref 4.0–10.5)

## 2010-12-25 LAB — COMPREHENSIVE METABOLIC PANEL
ALT: 10 U/L (ref 0–53)
BUN: 10 mg/dL (ref 6–23)
Calcium: 8.2 mg/dL — ABNORMAL LOW (ref 8.4–10.5)
Glucose, Bld: 129 mg/dL — ABNORMAL HIGH (ref 70–99)
Sodium: 139 mEq/L (ref 135–145)
Total Protein: 7 g/dL (ref 6.0–8.3)

## 2010-12-25 LAB — POCT I-STAT, CHEM 8
BUN: 10 mg/dL (ref 6–23)
Chloride: 103 mEq/L (ref 96–112)
Creatinine, Ser: 0.9 mg/dL (ref 0.4–1.5)
Glucose, Bld: 178 mg/dL — ABNORMAL HIGH (ref 70–99)
HCT: 42 % (ref 39.0–52.0)
Potassium: 3.4 mEq/L — ABNORMAL LOW (ref 3.5–5.1)

## 2010-12-25 LAB — URINALYSIS, MICROSCOPIC ONLY
Bilirubin Urine: NEGATIVE
Glucose, UA: NEGATIVE mg/dL
Hgb urine dipstick: NEGATIVE
Specific Gravity, Urine: 1.014 (ref 1.005–1.030)
Urobilinogen, UA: 0.2 mg/dL (ref 0.0–1.0)
pH: 5 (ref 5.0–8.0)

## 2010-12-25 LAB — HEMOGLOBIN A1C
Hgb A1c MFr Bld: 8.4 % — ABNORMAL HIGH (ref 4.6–6.1)
Mean Plasma Glucose: 194 mg/dL

## 2010-12-25 LAB — LIPID PANEL
Cholesterol: 142 mg/dL (ref 0–200)
HDL: 30 mg/dL — ABNORMAL LOW (ref >39)
LDL Cholesterol: 83 mg/dL (ref 0–99)
Triglycerides: 145 mg/dL (ref <150)

## 2010-12-25 LAB — GLUCOSE, CAPILLARY
Glucose-Capillary: 111 mg/dL — ABNORMAL HIGH (ref 70–99)
Glucose-Capillary: 118 mg/dL — ABNORMAL HIGH (ref 70–99)

## 2010-12-25 LAB — DIFFERENTIAL
Eosinophils Relative: 1 % (ref 0–5)
Lymphocytes Relative: 18 % (ref 12–46)
Lymphs Abs: 2.2 10*3/uL (ref 0.7–4.0)
Monocytes Absolute: 0.7 10*3/uL (ref 0.1–1.0)
Monocytes Relative: 6 % (ref 3–12)

## 2010-12-25 LAB — MRSA PCR SCREENING: MRSA by PCR: NEGATIVE

## 2010-12-25 LAB — CK TOTAL AND CKMB (NOT AT ARMC): Total CK: 91 U/L (ref 7–232)

## 2010-12-25 LAB — POCT CARDIAC MARKERS
CKMB, poc: 1.3 ng/mL (ref 1.0–8.0)
Myoglobin, poc: 74.8 ng/mL (ref 12–200)
Myoglobin, poc: 78.6 ng/mL (ref 12–200)
Troponin i, poc: <0.05 ng/mL (ref 0.00–0.09)

## 2010-12-25 LAB — LIPASE, BLOOD: Lipase: 16 U/L (ref 11–59)

## 2010-12-25 LAB — CARDIAC PANEL(CRET KIN+CKTOT+MB+TROPI)
Total CK: 82 U/L (ref 7–232)
Troponin I: 0.01 ng/mL (ref 0.00–0.06)

## 2011-01-05 ENCOUNTER — Encounter: Payer: PRIVATE HEALTH INSURANCE | Admitting: *Deleted

## 2011-01-05 LAB — HEPARIN LEVEL (UNFRACTIONATED)
Heparin Unfractionated: 0.12 IU/mL — ABNORMAL LOW (ref 0.30–0.70)
Heparin Unfractionated: 0.37 IU/mL (ref 0.30–0.70)
Heparin Unfractionated: 0.81 IU/mL — ABNORMAL HIGH (ref 0.30–0.70)
Heparin Unfractionated: 1.05 IU/mL — ABNORMAL HIGH (ref 0.30–0.70)
Heparin Unfractionated: <0.1 IU/mL — ABNORMAL LOW (ref 0.30–0.70)
Heparin Unfractionated: <0.1 IU/mL — ABNORMAL LOW (ref 0.30–0.70)

## 2011-01-05 LAB — DIFFERENTIAL
Basophils Absolute: 0 10*3/uL (ref 0.0–0.1)
Eosinophils Relative: 1 % (ref 0–5)
Lymphocytes Relative: 16 % (ref 12–46)
Monocytes Absolute: 0.4 10*3/uL (ref 0.1–1.0)
Monocytes Relative: 3 % (ref 3–12)
Neutro Abs: 8.5 10*3/uL — ABNORMAL HIGH (ref 1.7–7.7)

## 2011-01-05 LAB — COMPREHENSIVE METABOLIC PANEL
AST: 34 U/L (ref 0–37)
Albumin: 3.3 g/dL — ABNORMAL LOW (ref 3.5–5.2)
Alkaline Phosphatase: 65 U/L (ref 39–117)
BUN: 7 mg/dL (ref 6–23)
Chloride: 101 mEq/L (ref 96–112)
Creatinine, Ser: 0.89 mg/dL (ref 0.4–1.5)
GFR calc Af Amer: >60 mL/min (ref >60)
Potassium: 3.9 mEq/L (ref 3.5–5.1)
Total Bilirubin: 0.8 mg/dL (ref 0.3–1.2)
Total Protein: 7.3 g/dL (ref 6.0–8.3)

## 2011-01-05 LAB — CARDIAC PANEL(CRET KIN+CKTOT+MB+TROPI)
CK, MB: 1 ng/mL (ref 0.3–4.0)
Relative Index: INVALID (ref 0.0–2.5)
Total CK: 60 U/L (ref 7–232)
Troponin I: 0.02 ng/mL (ref 0.00–0.06)

## 2011-01-05 LAB — CBC
HCT: 37.1 % — ABNORMAL LOW (ref 39.0–52.0)
HCT: 37.1 % — ABNORMAL LOW (ref 39.0–52.0)
HCT: 37.8 % — ABNORMAL LOW (ref 39.0–52.0)
HCT: 38.1 % — ABNORMAL LOW (ref 39.0–52.0)
HCT: 38.3 % — ABNORMAL LOW (ref 39.0–52.0)
Hemoglobin: 12.2 g/dL — ABNORMAL LOW (ref 13.0–17.0)
Hemoglobin: 12.4 g/dL — ABNORMAL LOW (ref 13.0–17.0)
Hemoglobin: 12.7 g/dL — ABNORMAL LOW (ref 13.0–17.0)
Hemoglobin: 12.7 g/dL — ABNORMAL LOW (ref 13.0–17.0)
MCHC: 33 g/dL (ref 30.0–36.0)
MCHC: 33.3 g/dL (ref 30.0–36.0)
MCHC: 33.4 g/dL (ref 30.0–36.0)
MCHC: 33.7 g/dL (ref 30.0–36.0)
MCV: 82.7 fL (ref 78.0–100.0)
MCV: 82.9 fL (ref 78.0–100.0)
MCV: 83.1 fL (ref 78.0–100.0)
MCV: 83.1 fL (ref 78.0–100.0)
Platelets: 226 10*3/uL (ref 150–400)
Platelets: 229 10*3/uL (ref 150–400)
Platelets: 235 10*3/uL (ref 150–400)
Platelets: 239 10*3/uL (ref 150–400)
RBC: 4.23 MIL/uL (ref 4.22–5.81)
RBC: 4.37 MIL/uL (ref 4.22–5.81)
RBC: 4.44 MIL/uL (ref 4.22–5.81)
RBC: 4.58 MIL/uL (ref 4.22–5.81)
RBC: 4.62 MIL/uL (ref 4.22–5.81)
RDW: 16.7 % — ABNORMAL HIGH (ref 11.5–15.5)
RDW: 16.9 % — ABNORMAL HIGH (ref 11.5–15.5)
RDW: 17 % — ABNORMAL HIGH (ref 11.5–15.5)
RDW: 17 % — ABNORMAL HIGH (ref 11.5–15.5)
RDW: 17.1 % — ABNORMAL HIGH (ref 11.5–15.5)
WBC: 10.7 10*3/uL — ABNORMAL HIGH (ref 4.0–10.5)
WBC: 11.8 10*3/uL — ABNORMAL HIGH (ref 4.0–10.5)
WBC: 12.2 10*3/uL — ABNORMAL HIGH (ref 4.0–10.5)
WBC: 12.5 10*3/uL — ABNORMAL HIGH (ref 4.0–10.5)

## 2011-01-05 LAB — GLUCOSE, CAPILLARY
Glucose-Capillary: 108 mg/dL — ABNORMAL HIGH (ref 70–99)
Glucose-Capillary: 109 mg/dL — ABNORMAL HIGH (ref 70–99)
Glucose-Capillary: 124 mg/dL — ABNORMAL HIGH (ref 70–99)
Glucose-Capillary: 126 mg/dL — ABNORMAL HIGH (ref 70–99)
Glucose-Capillary: 128 mg/dL — ABNORMAL HIGH (ref 70–99)
Glucose-Capillary: 143 mg/dL — ABNORMAL HIGH (ref 70–99)
Glucose-Capillary: 146 mg/dL — ABNORMAL HIGH (ref 70–99)
Glucose-Capillary: 146 mg/dL — ABNORMAL HIGH (ref 70–99)
Glucose-Capillary: 146 mg/dL — ABNORMAL HIGH (ref 70–99)
Glucose-Capillary: 174 mg/dL — ABNORMAL HIGH (ref 70–99)
Glucose-Capillary: 188 mg/dL — ABNORMAL HIGH (ref 70–99)
Glucose-Capillary: 231 mg/dL — ABNORMAL HIGH (ref 70–99)
Glucose-Capillary: 268 mg/dL — ABNORMAL HIGH (ref 70–99)
Glucose-Capillary: 45 mg/dL — ABNORMAL LOW (ref 70–99)
Glucose-Capillary: 54 mg/dL — ABNORMAL LOW (ref 70–99)

## 2011-01-05 LAB — BASIC METABOLIC PANEL
BUN: 6 mg/dL (ref 6–23)
CO2: 30 mEq/L (ref 19–32)
Calcium: 8.4 mg/dL (ref 8.4–10.5)
Chloride: 99 mEq/L (ref 96–112)
Creatinine, Ser: 0.9 mg/dL (ref 0.4–1.5)
GFR calc Af Amer: >60 mL/min (ref >60)
GFR calc Af Amer: >60 mL/min (ref >60)
GFR calc non Af Amer: >60 mL/min (ref >60)
Glucose, Bld: 115 mg/dL — ABNORMAL HIGH (ref 70–99)
Potassium: 3.7 mEq/L (ref 3.5–5.1)
Sodium: 139 mEq/L (ref 135–145)

## 2011-01-05 LAB — POCT CARDIAC MARKERS
CKMB, poc: <1 ng/mL — ABNORMAL LOW (ref 1.0–8.0)
CKMB, poc: <1 ng/mL — ABNORMAL LOW (ref 1.0–8.0)
Myoglobin, poc: 65.3 ng/mL (ref 12–200)
Troponin i, poc: <0.05 ng/mL (ref 0.00–0.09)
Troponin i, poc: <0.05 ng/mL (ref 0.00–0.09)

## 2011-01-05 LAB — PROTIME-INR
INR: 1.28 (ref 0.00–1.49)
INR: 1.36 (ref 0.00–1.49)
INR: 1.41 (ref 0.00–1.49)
INR: 1.72 — ABNORMAL HIGH (ref 0.00–1.49)
INR: 2.07 — ABNORMAL HIGH (ref 0.00–1.49)
Prothrombin Time: 17.1 seconds — ABNORMAL HIGH (ref 11.6–15.2)
Prothrombin Time: 18 seconds — ABNORMAL HIGH (ref 11.6–15.2)
Prothrombin Time: 20 seconds — ABNORMAL HIGH (ref 11.6–15.2)
Prothrombin Time: 23.1 seconds — ABNORMAL HIGH (ref 11.6–15.2)

## 2011-01-05 LAB — BRAIN NATRIURETIC PEPTIDE: Pro B Natriuretic peptide (BNP): 70 pg/mL (ref 0.0–100.0)

## 2011-01-06 LAB — POCT I-STAT, CHEM 8
BUN: 12 mg/dL (ref 6–23)
Calcium, Ion: 1.06 mmol/L — ABNORMAL LOW (ref 1.12–1.32)
HCT: 39 % (ref 39.0–52.0)
Hemoglobin: 13.3 g/dL (ref 13.0–17.0)
Sodium: 141 mEq/L (ref 135–145)
TCO2: 28 mmol/L (ref 0–100)

## 2011-01-06 LAB — GLUCOSE, CAPILLARY
Glucose-Capillary: 115 mg/dL — ABNORMAL HIGH (ref 70–99)
Glucose-Capillary: 121 mg/dL — ABNORMAL HIGH (ref 70–99)
Glucose-Capillary: 148 mg/dL — ABNORMAL HIGH (ref 70–99)
Glucose-Capillary: 150 mg/dL — ABNORMAL HIGH (ref 70–99)
Glucose-Capillary: 157 mg/dL — ABNORMAL HIGH (ref 70–99)
Glucose-Capillary: 163 mg/dL — ABNORMAL HIGH (ref 70–99)
Glucose-Capillary: 224 mg/dL — ABNORMAL HIGH (ref 70–99)
Glucose-Capillary: 90 mg/dL (ref 70–99)

## 2011-01-06 LAB — CBC
HCT: 37 % — ABNORMAL LOW (ref 39.0–52.0)
HCT: 37.8 % — ABNORMAL LOW (ref 39.0–52.0)
HCT: 38.3 % — ABNORMAL LOW (ref 39.0–52.0)
Hemoglobin: 12.4 g/dL — ABNORMAL LOW (ref 13.0–17.0)
Hemoglobin: 12.6 g/dL — ABNORMAL LOW (ref 13.0–17.0)
Hemoglobin: 12.3 g/dL — ABNORMAL LOW (ref 13.0–17.0)
MCHC: 33.2 g/dL (ref 30.0–36.0)
MCHC: 33.6 g/dL (ref 30.0–36.0)
MCHC: 33.7 g/dL (ref 30.0–36.0)
MCHC: 33.8 g/dL (ref 30.0–36.0)
MCV: 82.5 fL (ref 78.0–100.0)
MCV: 83 fL (ref 78.0–100.0)
MCV: 82.1 fL (ref 78.0–100.0)
MCV: 82.5 fL (ref 78.0–100.0)
MCV: 82.7 fL (ref 78.0–100.0)
Platelets: 230 10*3/uL (ref 150–400)
Platelets: 276 10*3/uL (ref 150–400)
Platelets: 293 10*3/uL (ref 150–400)
RBC: 4.46 MIL/uL (ref 4.22–5.81)
RBC: 4.6 MIL/uL (ref 4.22–5.81)
RBC: 4.65 MIL/uL (ref 4.22–5.81)
RBC: 4.44 MIL/uL (ref 4.22–5.81)
RBC: 4.96 MIL/uL (ref 4.22–5.81)
RDW: 16.5 % — ABNORMAL HIGH (ref 11.5–15.5)
WBC: 12.1 10*3/uL — ABNORMAL HIGH (ref 4.0–10.5)
WBC: 12.1 10*3/uL — ABNORMAL HIGH (ref 4.0–10.5)
WBC: 11.2 10*3/uL — ABNORMAL HIGH (ref 4.0–10.5)
WBC: 11.7 10*3/uL — ABNORMAL HIGH (ref 4.0–10.5)

## 2011-01-06 LAB — DIFFERENTIAL
Basophils Absolute: 0 10*3/uL (ref 0.0–0.1)
Basophils Absolute: 0.1 10*3/uL (ref 0.0–0.1)
Basophils Relative: 0 % (ref 0–1)
Eosinophils Absolute: 0.2 10*3/uL (ref 0.0–0.7)
Eosinophils Absolute: 0.2 10*3/uL (ref 0.0–0.7)
Eosinophils Relative: 2 % (ref 0–5)
Monocytes Absolute: 0.8 10*3/uL (ref 0.1–1.0)
Neutro Abs: 11.2 10*3/uL — ABNORMAL HIGH (ref 1.7–7.7)
Neutrophils Relative %: 79 % — ABNORMAL HIGH (ref 43–77)

## 2011-01-06 LAB — POCT CARDIAC MARKERS
CKMB, poc: 1.1 ng/mL (ref 1.0–8.0)
CKMB, poc: <1 ng/mL — ABNORMAL LOW (ref 1.0–8.0)
Myoglobin, poc: 79 ng/mL (ref 12–200)
Myoglobin, poc: 79.7 ng/mL (ref 12–200)
Myoglobin, poc: 81.6 ng/mL (ref 12–200)
Troponin i, poc: <0.05 ng/mL (ref 0.00–0.09)

## 2011-01-06 LAB — COMPREHENSIVE METABOLIC PANEL
ALT: 27 U/L (ref 0–53)
AST: 37 U/L (ref 0–37)
Albumin: 3.2 g/dL — ABNORMAL LOW (ref 3.5–5.2)
Alkaline Phosphatase: 62 U/L (ref 39–117)
CO2: 29 mEq/L (ref 19–32)
Chloride: 102 mEq/L (ref 96–112)
GFR calc Af Amer: >60 mL/min (ref >60)
GFR calc non Af Amer: >60 mL/min (ref >60)
Potassium: 3.8 mEq/L (ref 3.5–5.1)
Sodium: 140 mEq/L (ref 135–145)
Total Bilirubin: 0.7 mg/dL (ref 0.3–1.2)

## 2011-01-06 LAB — PROTIME-INR
INR: 1.9 — ABNORMAL HIGH (ref 0.00–1.49)
INR: 2.1 — ABNORMAL HIGH (ref 0.00–1.49)
INR: 1.9 — ABNORMAL HIGH (ref 0.00–1.49)
INR: 3.6 — ABNORMAL HIGH (ref 0.00–1.49)
Prothrombin Time: 23.5 seconds — ABNORMAL HIGH (ref 11.6–15.2)
Prothrombin Time: 18.9 seconds — ABNORMAL HIGH (ref 11.6–15.2)
Prothrombin Time: 21.3 seconds — ABNORMAL HIGH (ref 11.6–15.2)
Prothrombin Time: 35.9 seconds — ABNORMAL HIGH (ref 11.6–15.2)

## 2011-01-06 LAB — POCT I-STAT 3, ART BLOOD GAS (G3+)
Acid-Base Excess: 4 mmol/L — ABNORMAL HIGH (ref 0.0–2.0)
O2 Saturation: 93 %
Patient temperature: 98.1
TCO2: 31 mmol/L (ref 0–100)

## 2011-01-06 LAB — CULTURE, BLOOD (ROUTINE X 2): Culture: NO GROWTH

## 2011-01-06 LAB — BASIC METABOLIC PANEL
BUN: 10 mg/dL (ref 6–23)
CO2: 31 mEq/L (ref 19–32)
CO2: 35 mEq/L — ABNORMAL HIGH (ref 19–32)
CO2: 29 mEq/L (ref 19–32)
Calcium: 8.7 mg/dL (ref 8.4–10.5)
Calcium: 8.8 mg/dL (ref 8.4–10.5)
Calcium: 8.5 mg/dL (ref 8.4–10.5)
Chloride: 101 mEq/L (ref 96–112)
Chloride: 103 mEq/L (ref 96–112)
Chloride: 105 mEq/L (ref 96–112)
Creatinine, Ser: 0.91 mg/dL (ref 0.4–1.5)
Creatinine, Ser: 1.25 mg/dL (ref 0.4–1.5)
GFR calc Af Amer: >60 mL/min (ref >60)
GFR calc Af Amer: >60 mL/min (ref >60)
GFR calc Af Amer: >60 mL/min (ref >60)
GFR calc Af Amer: >60 mL/min (ref >60)
GFR calc non Af Amer: >60 mL/min (ref >60)
GFR calc non Af Amer: >60 mL/min (ref >60)
Glucose, Bld: 121 mg/dL — ABNORMAL HIGH (ref 70–99)
Potassium: 3.9 mEq/L (ref 3.5–5.1)
Potassium: 4 mEq/L (ref 3.5–5.1)
Potassium: 4.1 mEq/L (ref 3.5–5.1)
Sodium: 139 mEq/L (ref 135–145)
Sodium: 141 mEq/L (ref 135–145)
Sodium: 142 mEq/L (ref 135–145)
Sodium: 141 mEq/L (ref 135–145)

## 2011-01-06 LAB — CARDIAC PANEL(CRET KIN+CKTOT+MB+TROPI)
CK, MB: 1.1 ng/mL (ref 0.3–4.0)
Relative Index: INVALID (ref 0.0–2.5)
Relative Index: INVALID (ref 0.0–2.5)
Total CK: 63 U/L (ref 7–232)
Total CK: 63 U/L (ref 7–232)
Total CK: 59 U/L (ref 7–232)

## 2011-01-06 LAB — BRAIN NATRIURETIC PEPTIDE: Pro B Natriuretic peptide (BNP): 96 pg/mL (ref 0.0–100.0)

## 2011-01-06 LAB — VANCOMYCIN, TROUGH: Vancomycin Tr: 7.1 ug/mL — ABNORMAL LOW (ref 10.0–20.0)

## 2011-01-06 LAB — HEMOGLOBIN A1C
Hgb A1c MFr Bld: 8.1 % — ABNORMAL HIGH (ref 4.6–6.1)
Mean Plasma Glucose: 186 mg/dL

## 2011-01-06 LAB — TROPONIN I: Troponin I: 0.03 ng/mL (ref 0.00–0.06)

## 2011-01-06 LAB — CK TOTAL AND CKMB (NOT AT ARMC): Relative Index: INVALID (ref 0.0–2.5)

## 2011-01-07 LAB — BLOOD GAS, ARTERIAL
Acid-Base Excess: 6.3 mmol/L — ABNORMAL HIGH (ref 0.0–2.0)
O2 Content: 6 L/min
pCO2 arterial: 45.7 mmHg — ABNORMAL HIGH (ref 35.0–45.0)
pH, Arterial: 7.438 (ref 7.350–7.450)

## 2011-01-07 LAB — COMPREHENSIVE METABOLIC PANEL
ALT: 14 U/L (ref 0–53)
AST: 22 U/L (ref 0–37)
Albumin: 2.9 g/dL — ABNORMAL LOW (ref 3.5–5.2)
Albumin: 3 g/dL — ABNORMAL LOW (ref 3.5–5.2)
Alkaline Phosphatase: 52 U/L (ref 39–117)
Alkaline Phosphatase: 54 U/L (ref 39–117)
BUN: 8 mg/dL (ref 6–23)
BUN: 8 mg/dL (ref 6–23)
Calcium: 9.1 mg/dL (ref 8.4–10.5)
Chloride: 101 mEq/L (ref 96–112)
Chloride: 102 mEq/L (ref 96–112)
Creatinine, Ser: 0.94 mg/dL (ref 0.4–1.5)
Creatinine, Ser: 0.99 mg/dL (ref 0.4–1.5)
GFR calc non Af Amer: >60 mL/min (ref >60)
GFR calc non Af Amer: >60 mL/min (ref >60)
Glucose, Bld: 122 mg/dL — ABNORMAL HIGH (ref 70–99)
Glucose, Bld: 92 mg/dL (ref 70–99)
Potassium: 3.4 mEq/L — ABNORMAL LOW (ref 3.5–5.1)
Potassium: 3.7 mEq/L (ref 3.5–5.1)
Sodium: 144 mEq/L (ref 135–145)
Total Bilirubin: 0.6 mg/dL (ref 0.3–1.2)
Total Bilirubin: 0.6 mg/dL (ref 0.3–1.2)
Total Protein: 7 g/dL (ref 6.0–8.3)

## 2011-01-07 LAB — BASIC METABOLIC PANEL
BUN: 6 mg/dL (ref 6–23)
BUN: 7 mg/dL (ref 6–23)
BUN: 9 mg/dL (ref 6–23)
CO2: 27 mEq/L (ref 19–32)
CO2: 30 mEq/L (ref 19–32)
CO2: 32 mEq/L (ref 19–32)
CO2: 32 mEq/L (ref 19–32)
Calcium: 8.7 mg/dL (ref 8.4–10.5)
Calcium: 8.7 mg/dL (ref 8.4–10.5)
Calcium: 8.8 mg/dL (ref 8.4–10.5)
Chloride: 103 mEq/L (ref 96–112)
Chloride: 99 mEq/L (ref 96–112)
Chloride: 102 mEq/L (ref 96–112)
Creatinine, Ser: 0.93 mg/dL (ref 0.4–1.5)
Creatinine, Ser: 0.98 mg/dL (ref 0.4–1.5)
GFR calc Af Amer: >60 mL/min (ref >60)
GFR calc Af Amer: >60 mL/min (ref >60)
GFR calc non Af Amer: >60 mL/min (ref >60)
GFR calc non Af Amer: >60 mL/min (ref >60)
GFR calc non Af Amer: >60 mL/min (ref >60)
Glucose, Bld: 105 mg/dL — ABNORMAL HIGH (ref 70–99)
Glucose, Bld: 90 mg/dL (ref 70–99)
Glucose, Bld: 130 mg/dL — ABNORMAL HIGH (ref 70–99)
Potassium: 3.4 mEq/L — ABNORMAL LOW (ref 3.5–5.1)
Potassium: 3.7 mEq/L (ref 3.5–5.1)
Potassium: 3.7 mEq/L (ref 3.5–5.1)
Potassium: 3.8 mEq/L (ref 3.5–5.1)
Potassium: 3.7 mEq/L (ref 3.5–5.1)
Sodium: 138 mEq/L (ref 135–145)
Sodium: 139 mEq/L (ref 135–145)
Sodium: 141 mEq/L (ref 135–145)
Sodium: 141 mEq/L (ref 135–145)

## 2011-01-07 LAB — CBC
HCT: 36 % — ABNORMAL LOW (ref 39.0–52.0)
HCT: 36.5 % — ABNORMAL LOW (ref 39.0–52.0)
HCT: 37.3 % — ABNORMAL LOW (ref 39.0–52.0)
HCT: 37.8 % — ABNORMAL LOW (ref 39.0–52.0)
HCT: 38.1 % — ABNORMAL LOW (ref 39.0–52.0)
HCT: 42.7 % (ref 39.0–52.0)
Hemoglobin: 12.2 g/dL — ABNORMAL LOW (ref 13.0–17.0)
Hemoglobin: 12.4 g/dL — ABNORMAL LOW (ref 13.0–17.0)
Hemoglobin: 12.6 g/dL — ABNORMAL LOW (ref 13.0–17.0)
Hemoglobin: 12.8 g/dL — ABNORMAL LOW (ref 13.0–17.0)
Hemoglobin: 13.2 g/dL (ref 13.0–17.0)
Hemoglobin: 13.7 g/dL (ref 13.0–17.0)
Hemoglobin: 14.6 g/dL (ref 13.0–17.0)
MCHC: 33.3 g/dL (ref 30.0–36.0)
MCHC: 33.4 g/dL (ref 30.0–36.0)
MCHC: 33.8 g/dL (ref 30.0–36.0)
MCHC: 33.8 g/dL (ref 30.0–36.0)
MCHC: 34.1 g/dL (ref 30.0–36.0)
MCV: 82.1 fL (ref 78.0–100.0)
MCV: 82.3 fL (ref 78.0–100.0)
MCV: 82.7 fL (ref 78.0–100.0)
MCV: 83 fL (ref 78.0–100.0)
MCV: 82.3 fL (ref 78.0–100.0)
Platelets: 231 10*3/uL (ref 150–400)
Platelets: 233 10*3/uL (ref 150–400)
Platelets: 245 10*3/uL (ref 150–400)
Platelets: 258 10*3/uL (ref 150–400)
Platelets: 294 10*3/uL (ref 150–400)
Platelets: 215 10*3/uL (ref 150–400)
RBC: 4.49 MIL/uL (ref 4.22–5.81)
RBC: 4.7 MIL/uL (ref 4.22–5.81)
RBC: 4.95 MIL/uL (ref 4.22–5.81)
RBC: 5.02 MIL/uL (ref 4.22–5.81)
RDW: 15.3 % (ref 11.5–15.5)
RDW: 15.4 % (ref 11.5–15.5)
RDW: 15.5 % (ref 11.5–15.5)
RDW: 15.7 % — ABNORMAL HIGH (ref 11.5–15.5)
RDW: 15 % (ref 11.5–15.5)
WBC: 11.3 10*3/uL — ABNORMAL HIGH (ref 4.0–10.5)
WBC: 11.8 10*3/uL — ABNORMAL HIGH (ref 4.0–10.5)
WBC: 12.5 10*3/uL — ABNORMAL HIGH (ref 4.0–10.5)
WBC: 14.6 10*3/uL — ABNORMAL HIGH (ref 4.0–10.5)
WBC: 14.8 10*3/uL — ABNORMAL HIGH (ref 4.0–10.5)
WBC: 10.8 10*3/uL — ABNORMAL HIGH (ref 4.0–10.5)

## 2011-01-07 LAB — POCT I-STAT 3, ART BLOOD GAS (G3+)
Acid-Base Excess: 8 mmol/L — ABNORMAL HIGH (ref 0.0–2.0)
Bicarbonate: 32.4 mEq/L — ABNORMAL HIGH (ref 20.0–24.0)
TCO2: 34 mmol/L (ref 0–100)

## 2011-01-07 LAB — LEGIONELLA ANTIGEN, URINE: Legionella Antigen, Urine: NEGATIVE

## 2011-01-07 LAB — GLUCOSE, CAPILLARY
Glucose-Capillary: 105 mg/dL — ABNORMAL HIGH (ref 70–99)
Glucose-Capillary: 108 mg/dL — ABNORMAL HIGH (ref 70–99)
Glucose-Capillary: 109 mg/dL — ABNORMAL HIGH (ref 70–99)
Glucose-Capillary: 114 mg/dL — ABNORMAL HIGH (ref 70–99)
Glucose-Capillary: 115 mg/dL — ABNORMAL HIGH (ref 70–99)
Glucose-Capillary: 124 mg/dL — ABNORMAL HIGH (ref 70–99)
Glucose-Capillary: 126 mg/dL — ABNORMAL HIGH (ref 70–99)
Glucose-Capillary: 128 mg/dL — ABNORMAL HIGH (ref 70–99)
Glucose-Capillary: 131 mg/dL — ABNORMAL HIGH (ref 70–99)
Glucose-Capillary: 151 mg/dL — ABNORMAL HIGH (ref 70–99)
Glucose-Capillary: 67 mg/dL — ABNORMAL LOW (ref 70–99)
Glucose-Capillary: 77 mg/dL (ref 70–99)
Glucose-Capillary: 81 mg/dL (ref 70–99)
Glucose-Capillary: 88 mg/dL (ref 70–99)
Glucose-Capillary: 89 mg/dL (ref 70–99)
Glucose-Capillary: 93 mg/dL (ref 70–99)
Glucose-Capillary: 95 mg/dL (ref 70–99)
Glucose-Capillary: 141 mg/dL — ABNORMAL HIGH (ref 70–99)
Glucose-Capillary: 141 mg/dL — ABNORMAL HIGH (ref 70–99)
Glucose-Capillary: 152 mg/dL — ABNORMAL HIGH (ref 70–99)
Glucose-Capillary: 181 mg/dL — ABNORMAL HIGH (ref 70–99)
Glucose-Capillary: 89 mg/dL (ref 70–99)
Glucose-Capillary: 96 mg/dL (ref 70–99)

## 2011-01-07 LAB — CARDIAC PANEL(CRET KIN+CKTOT+MB+TROPI)
CK, MB: 1.1 ng/mL (ref 0.3–4.0)
Relative Index: 0.6 (ref 0.0–2.5)
Relative Index: 0.8 (ref 0.0–2.5)

## 2011-01-07 LAB — CK TOTAL AND CKMB (NOT AT ARMC)
CK, MB: 1.1 ng/mL (ref 0.3–4.0)
Relative Index: 1 (ref 0.0–2.5)

## 2011-01-07 LAB — PROTIME-INR
INR: 1.2 (ref 0.00–1.49)
INR: 1.3 (ref 0.00–1.49)
INR: 1.4 (ref 0.00–1.49)
INR: 1.2 (ref 0.00–1.49)
Prothrombin Time: 15.4 seconds — ABNORMAL HIGH (ref 11.6–15.2)
Prothrombin Time: 15.9 seconds — ABNORMAL HIGH (ref 11.6–15.2)
Prothrombin Time: 16.5 seconds — ABNORMAL HIGH (ref 11.6–15.2)
Prothrombin Time: 17.7 seconds — ABNORMAL HIGH (ref 11.6–15.2)
Prothrombin Time: 14.6 seconds (ref 11.6–15.2)

## 2011-01-07 LAB — URINALYSIS, ROUTINE W REFLEX MICROSCOPIC
Bilirubin Urine: NEGATIVE
Glucose, UA: NEGATIVE mg/dL
Glucose, UA: NEGATIVE mg/dL
Ketones, ur: NEGATIVE mg/dL
Nitrite: NEGATIVE
Protein, ur: NEGATIVE mg/dL
Specific Gravity, Urine: 1.013 (ref 1.005–1.030)
Specific Gravity, Urine: 1.018 (ref 1.005–1.030)
Urobilinogen, UA: 0.2 mg/dL (ref 0.0–1.0)
pH: 5.5 (ref 5.0–8.0)

## 2011-01-07 LAB — POCT I-STAT, CHEM 8
BUN: 7 mg/dL (ref 6–23)
Chloride: 102 mEq/L (ref 96–112)
Sodium: 141 mEq/L (ref 135–145)
TCO2: 29 mmol/L (ref 0–100)

## 2011-01-07 LAB — HEPARIN LEVEL (UNFRACTIONATED)
Heparin Unfractionated: 0.57 IU/mL (ref 0.30–0.70)
Heparin Unfractionated: 0.61 IU/mL (ref 0.30–0.70)

## 2011-01-07 LAB — DIFFERENTIAL
Basophils Absolute: 0 10*3/uL (ref 0.0–0.1)
Basophils Absolute: 0.1 10*3/uL (ref 0.0–0.1)
Eosinophils Relative: 2 % (ref 0–5)
Lymphocytes Relative: 14 % (ref 12–46)
Lymphocytes Relative: 20 % (ref 12–46)
Lymphocytes Relative: 20 % (ref 12–46)
Lymphs Abs: 2 10*3/uL (ref 0.7–4.0)
Lymphs Abs: 3 10*3/uL (ref 0.7–4.0)
Monocytes Absolute: 0.7 10*3/uL (ref 0.1–1.0)
Monocytes Relative: 5 % (ref 3–12)
Neutro Abs: 10.7 10*3/uL — ABNORMAL HIGH (ref 1.7–7.7)
Neutro Abs: 11 10*3/uL — ABNORMAL HIGH (ref 1.7–7.7)
Neutro Abs: 11.7 10*3/uL — ABNORMAL HIGH (ref 1.7–7.7)

## 2011-01-07 LAB — EXPECTORATED SPUTUM ASSESSMENT W GRAM STAIN, RFLX TO RESP C

## 2011-01-07 LAB — BRAIN NATRIURETIC PEPTIDE
Pro B Natriuretic peptide (BNP): 146 pg/mL — ABNORMAL HIGH (ref 0.0–100.0)
Pro B Natriuretic peptide (BNP): 152 pg/mL — ABNORMAL HIGH (ref 0.0–100.0)
Pro B Natriuretic peptide (BNP): 73 pg/mL (ref 0.0–100.0)

## 2011-01-07 LAB — VANCOMYCIN, TROUGH: Vancomycin Tr: 25.9 ug/mL (ref 10.0–20.0)

## 2011-01-07 LAB — URINE CULTURE
Colony Count: NO GROWTH
Special Requests: NEGATIVE

## 2011-01-07 LAB — CULTURE, BLOOD (ROUTINE X 2)

## 2011-01-07 LAB — URINE MICROSCOPIC-ADD ON

## 2011-01-07 LAB — POCT CARDIAC MARKERS: Myoglobin, poc: 176 ng/mL (ref 12–200)

## 2011-01-07 LAB — D-DIMER, QUANTITATIVE: D-Dimer, Quant: 0.53 ug/mL-FEU — ABNORMAL HIGH (ref 0.00–0.48)

## 2011-01-07 LAB — STREP PNEUMONIAE URINARY ANTIGEN: Strep Pneumo Urinary Antigen: NEGATIVE

## 2011-01-07 LAB — TROPONIN I: Troponin I: 0.02 ng/mL (ref 0.00–0.06)

## 2011-01-07 LAB — HEPARIN ANTI-XA: Heparin LMW: 1.18 IU/mL

## 2011-01-08 LAB — RAPID URINE DRUG SCREEN, HOSP PERFORMED
Barbiturates: NOT DETECTED
Opiates: POSITIVE — AB

## 2011-01-08 LAB — POCT I-STAT, CHEM 8
BUN: 20 mg/dL (ref 6–23)
Creatinine, Ser: 1.2 mg/dL (ref 0.4–1.5)
Potassium: 3.9 mEq/L (ref 3.5–5.1)
Sodium: 139 mEq/L (ref 135–145)

## 2011-01-08 LAB — CARDIAC PANEL(CRET KIN+CKTOT+MB+TROPI)
Relative Index: 1.5 (ref 0.0–2.5)
Total CK: 336 U/L — ABNORMAL HIGH (ref 7–232)
Troponin I: 0.22 ng/mL — ABNORMAL HIGH (ref 0.00–0.06)

## 2011-01-08 LAB — COMPREHENSIVE METABOLIC PANEL
ALT: 12 U/L (ref 0–53)
AST: 21 U/L (ref 0–37)
Albumin: 3.2 g/dL — ABNORMAL LOW (ref 3.5–5.2)
Alkaline Phosphatase: 63 U/L (ref 39–117)
Chloride: 101 mEq/L (ref 96–112)
Potassium: 4.2 mEq/L (ref 3.5–5.1)
Sodium: 139 mEq/L (ref 135–145)
Total Protein: 7.2 g/dL (ref 6.0–8.3)

## 2011-01-08 LAB — CBC
Hemoglobin: 15.1 g/dL (ref 13.0–17.0)
MCHC: 33.7 g/dL (ref 30.0–36.0)
MCV: 82.2 fL (ref 78.0–100.0)
Platelets: 217 10*3/uL (ref 150–400)
RBC: 5.4 MIL/uL (ref 4.22–5.81)
RDW: 15.3 % (ref 11.5–15.5)
WBC: 13.3 10*3/uL — ABNORMAL HIGH (ref 4.0–10.5)

## 2011-01-08 LAB — POCT CARDIAC MARKERS
CKMB, poc: 1.4 ng/mL (ref 1.0–8.0)
Myoglobin, poc: 77.5 ng/mL (ref 12–200)
Troponin i, poc: 0.06 ng/mL (ref 0.00–0.09)
Troponin i, poc: <0.05 ng/mL (ref 0.00–0.09)

## 2011-01-08 LAB — GLUCOSE, CAPILLARY: Glucose-Capillary: 155 mg/dL — ABNORMAL HIGH (ref 70–99)

## 2011-01-08 LAB — TROPONIN I: Troponin I: 0.23 ng/mL — ABNORMAL HIGH (ref 0.00–0.06)

## 2011-01-08 LAB — CULTURE, BLOOD (ROUTINE X 2)
Culture: NO GROWTH
Culture: NO GROWTH

## 2011-01-08 LAB — DIFFERENTIAL
Basophils Relative: 1 % (ref 0–1)
Eosinophils Absolute: 0.1 10*3/uL (ref 0.0–0.7)
Monocytes Absolute: 1 10*3/uL (ref 0.1–1.0)
Monocytes Relative: 7 % (ref 3–12)
Neutrophils Relative %: 69 % (ref 43–77)

## 2011-01-08 LAB — CK TOTAL AND CKMB (NOT AT ARMC)
Relative Index: 2.2 (ref 0.0–2.5)
Total CK: 116 U/L (ref 7–232)

## 2011-01-08 LAB — TSH: TSH: 2.117 u[IU]/mL (ref 0.350–4.500)

## 2011-01-08 LAB — HEPARIN LEVEL (UNFRACTIONATED): Heparin Unfractionated: 0.49 IU/mL (ref 0.30–0.70)

## 2011-01-08 LAB — LIPID PANEL: HDL: 35 mg/dL — ABNORMAL LOW (ref >39)

## 2011-01-08 LAB — BRAIN NATRIURETIC PEPTIDE: Pro B Natriuretic peptide (BNP): 207 pg/mL — ABNORMAL HIGH (ref 0.0–100.0)

## 2011-01-11 ENCOUNTER — Other Ambulatory Visit: Payer: Self-pay | Admitting: Cardiology

## 2011-01-11 ENCOUNTER — Encounter: Payer: PRIVATE HEALTH INSURANCE | Admitting: *Deleted

## 2011-01-13 ENCOUNTER — Ambulatory Visit (INDEPENDENT_AMBULATORY_CARE_PROVIDER_SITE_OTHER): Payer: PRIVATE HEALTH INSURANCE | Admitting: *Deleted

## 2011-01-13 DIAGNOSIS — I4892 Unspecified atrial flutter: Secondary | ICD-10-CM

## 2011-01-13 DIAGNOSIS — I4891 Unspecified atrial fibrillation: Secondary | ICD-10-CM

## 2011-01-13 DIAGNOSIS — Z7901 Long term (current) use of anticoagulants: Secondary | ICD-10-CM

## 2011-01-16 LAB — POCT I-STAT, CHEM 8
Calcium, Ion: 1.18 mmol/L (ref 1.12–1.32)
Hemoglobin: 16.7 g/dL (ref 13.0–17.0)
Sodium: 138 mEq/L (ref 135–145)
TCO2: 27 mmol/L (ref 0–100)

## 2011-01-16 LAB — URINALYSIS, ROUTINE W REFLEX MICROSCOPIC
Glucose, UA: >1000 mg/dL — AB
Leukocytes, UA: NEGATIVE
Nitrite: NEGATIVE
Protein, ur: NEGATIVE mg/dL
pH: 6 (ref 5.0–8.0)

## 2011-01-16 LAB — COMPREHENSIVE METABOLIC PANEL
ALT: 15 U/L (ref 0–53)
AST: 25 U/L (ref 0–37)
Alkaline Phosphatase: 81 U/L (ref 39–117)
CO2: 26 mEq/L (ref 19–32)
Chloride: 98 mEq/L (ref 96–112)
GFR calc Af Amer: >60 mL/min (ref >60)
GFR calc non Af Amer: >60 mL/min (ref >60)
Potassium: 4.3 mEq/L (ref 3.5–5.1)
Sodium: 133 mEq/L — ABNORMAL LOW (ref 135–145)
Total Bilirubin: 1.1 mg/dL (ref 0.3–1.2)

## 2011-01-16 LAB — GLUCOSE, CAPILLARY: Glucose-Capillary: 359 mg/dL — ABNORMAL HIGH (ref 70–99)

## 2011-01-16 LAB — DIFFERENTIAL
Basophils Absolute: 0.1 10*3/uL (ref 0.0–0.1)
Eosinophils Absolute: 0.1 10*3/uL (ref 0.0–0.7)
Eosinophils Relative: 1 % (ref 0–5)
Monocytes Absolute: 0.7 10*3/uL (ref 0.1–1.0)

## 2011-01-16 LAB — KETONES, QUALITATIVE

## 2011-01-16 LAB — CBC
MCV: 80.8 fL (ref 78.0–100.0)
RBC: 5.51 MIL/uL (ref 4.22–5.81)
WBC: 11.1 10*3/uL — ABNORMAL HIGH (ref 4.0–10.5)

## 2011-02-02 ENCOUNTER — Other Ambulatory Visit: Payer: Self-pay | Admitting: Nurse Practitioner

## 2011-02-03 ENCOUNTER — Encounter: Payer: PRIVATE HEALTH INSURANCE | Admitting: *Deleted

## 2011-02-14 NOTE — H&P (Signed)
NAME:  [name], [name]                  ACCOUNT NO.:  [id number]   MEDICAL RECORD NO.:  [id number]          PATIENT TYPE:  INP   LOCATION:  2918                         FACILITY:  [facility]   PHYSICIAN:  Kela Millin, M.D.DATE OF BIRTH:  06/06/1971   DATE OF ADMISSION:  05/25/2009  DATE OF DISCHARGE:                              HISTORY & PHYSICAL   CHIEF COMPLAINT:  Shortness of breath.   HISTORY OF PRESENT ILLNESS:  [name] is a 40 year old male who  presents to the [facility] Emergency Department with complaint of  shortness of breath.  The patient was admitted on July 31, 20109,  through May 04, 2009, to the Cardiology Service for chest pain and  atrial flutter.  He spontaneously converted to normal sinus rhythm and  was discharged to home on Coumadin.  The patient presented on May 21, 2009, to [facility] ED secondary to increasing shortness of breath and  was told that he had viral bronchitis.  He felt worse and presented to  his primary MD today who sent the patient to the emergency department.  Chest x-ray performed here in the ED notes asymmetric edema, questioning  infiltrate versus pulmonary edema.   ALLERGIES:  The patient is allergic to IODINE and SHELLFISH, these cause  hives and anaphylaxis.   PAST MEDICAL HISTORY:  1. Morbid obesity.  2. GERD.  3. Allergic rhinitis.  4. Anxiety.  5. Depression.  6. Diabetes type 2, extremely insulin resistant.  7. Hypertension.  8. Chronic low back pain.  9. Chronic nystagmus.  10.Chronic right ptosis.  11.Atrial flutter last admission with spontaneous conversion to normal      sinus rhythm.   PAST SURGICAL HISTORY:  Tonsillectomy.   SOCIAL HISTORY:  He is separated.  Denies tobacco use.  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  He notes very rare alcohol abuse.  He  has no children.   FAMILY HISTORY:  1. Mother is deceased at 36 secondary to liver cancer.  She also had a      history of coronary artery  disease, hypertension, and chronic      bronchitis.  2. Father is deceased at 66 with diabetes type 2, prostate cancer,      CVA, and heart disease.   MEDICATIONS:  1. Diltiazem HCl ER 360 mg p.o. daily.  2. Humalog Mix 75/25, 130 units subcu in the morning and 170 units      subcu in the p.m.  3. Glucophage 1000 mg p.o. b.i.d.  4. Carvedilol 6.25 mg p.o. b.i.d.  5. Digoxin 0.25 mcg p.o. daily.  6. Lasix 40 mg p.o. daily.  7. Coumadin 10 mg p.o. daily.   REVIEW OF SYSTEMS:  GENERAL:  Positive subjective fever.  Positive  chills.  CARDIOVASCULAR:  Denies palpitations.  Did have stabbing chest  pain in the middle of his chest which is worse with inspiration.  He  also notes increasing lower extremity edema.  RESPIRATORY:  Positive  shortness of breath, became severe last Friday.  Does note brownish  sputum which is dark  yellow.  GI:  No nausea, vomiting, or diarrhea.  GU:  Denies dysuria.  Denies hematuria.  MUSCULOSKELETAL:  No muscle or  joint pain.  NEURO:  Chronic nystagmus and chronic right ptosis.  HEM:  No bruising, no bleeding.  SKIN:  No rashes, no lesions.   LABORATORY DATA/RADIOLOGY:  White blood cell count 14.8, hemoglobin  13.2, hematocrit 38.9, and platelets 255.  BNP 152.  Sodium 141,  potassium 3.4, chloride 102, bicarb 29, BUN 7, creatinine 0.9, and  glucose 112.  Point-of-care enzymes are negative.  Chest x-ray notes  persistent and slight worsening of asymmetric air space process,  pulmonary edema versus infiltrates.   PHYSICAL EXAMINATION:  VITAL SIGNS:  BP 127/74.  Heart rate 82.  Respiratory rate in the 30s at the time of my exam.  Temperature 98.8  per the patient, it was not documented in MSTAT.  Oxygen saturation 80%  on room air on admission, came up to 95% on 4 liters.  HEAD:  Normocephalic and atraumatic.  GENERAL:  Awake, somewhat somnolent, morbidly obese male, mildly  tachypneic at rest.  NECK:  Thick.  No palpable masses.  ENT:  Moist oral  mucosa.  CARDIOVASCULAR:  Diminished heart sounds secondary to habitus 3+  bilateral lower extremity edema is noted.  RESPIRATORY:  Diminished breath sounds at bases.  Mild tachypnea is  noted.  Sats note to fall into the high 80s on 4 liters nasal cannula  with conversation.  ABDOMEN:  Large draping pannus, soft, nontender.  PSYCHIATRIC:  A and O x3, pleasant.  SKIN:  No rashes.  No lesions noted.   ASSESSMENT/PLAN:  1. Acute respiratory failure in the setting of acute congestive heart      failure exacerbation and probable bilateral pneumonia.  Plan to      diurese with IV Lasix, check blood cultures x2, sputum culture and      start IV vancomycin and Zosyn.  We will check a stat ABG and start      BiPAP pending ABG results.  We will also check urine for Legionella      and strep pneumo urinary antigen.  We have requested a cardiology      consult and continue O2.  We have requested stepdown bed and if it      is unavailable we will need to be placed in the ICU.  2. Recurrent atrial flutter.  This is likely contributing the      patient's volume overload.  His INR is 1.2 today.  We will plan to      bridge with full-dose Lovenox, defer further management to      Cardiology.  Continue beta-blocker and digoxin for now.  We will      cycle cardiac enzymes q.8 h. x3.  3. Diabetes type 2.  The patient is extremely insulin resistant.  We      will continue insulin at decreased dose while inpatient, add      sliding scale coverage, hold metformin for now.  4. Morbid obesity.  We will request nutrition evaluation.  5. Hypertension.  His blood pressure is stable.  Plan to continue home      meds.  6. Chronic low back pain.  This is currently at baseline.  7. Hypokalemia.  We will replete p.o.  Plan to check BMET in the a.m.  8. Allergic rhinitis, stable.  9. History of anxiety and depression.  This appears stable.  10.Gastroesophageal reflux disease.  This is also stable.  We will add       a proton pump inhibitor for GI prophylaxis.  11.Code status.  This was discussed with the patient.  He requests      full code.  We will notify Pulmonary Critical Care Medicine about      the patient's admission should his status deteriorate overnight.      Sandford Craze, NP      Kela Millin, M.D.  Electronically Signed    MO/MEDQ  D:  05/25/2009  T:  05/26/2009  Job:  [id number]   cc:   Dr. Susa Loffler. Jens Som, MD, [facility]

## 2011-02-14 NOTE — Discharge Summary (Signed)
NAME:  MICAIAH, LITLE NO.:  [id number]   MEDICAL RECORD NO.:  [id number]          PATIENT TYPE:  INP   LOCATION:  3738                         FACILITY:  [facility]   PHYSICIAN:  Madolyn Frieze. Jens Som, MD, FACCDATE OF BIRTH:  [birth year]   DATE OF ADMISSION:  05/01/2009  DATE OF DISCHARGE:  05/04/2009                               DISCHARGE SUMMARY   PROCEDURES:  Two-dimensional echocardiogram.   PRIMARY FINAL DISCHARGE DIAGNOSES:  1. Chest pain, minimal elevation in troponin, felt secondary to atrial      flutter.  2. Atrial flutter, spontaneous conversion to sinus rhythm.   SECONDARY DIAGNOSES:  1. Diabetes.  2. Asthma.  3. Hypertension.  4. Morbid obesity.  5. Anticoagulation with Coumadin started this admission.  6. Allergy or intolerance to iodine and shellfish.  7. Gastroesophageal reflux disease symptoms.  8. Possible obstructive sleep apnea, outpatient evaluation arranged.   TIME OF DISCHARGE:  42 minutes.   HOSPITAL COURSE:  Mr. Munday is a 40 year old male with no previous  history of coronary artery disease.  He had chest pain and came to the  hospital where his EKG showed atrial flutter.  His initial troponins  were elevated, so he was admitted for further evaluation.   All of his CK-MBs were negative, but troponins remained consistently  elevated between 0.19 and 0.23.  A lipid profile showed a total  cholesterol of 146, triglycerides 133, HDL 35, LDL 84.  TSH was within  normal limits at 2.117.  Initially, a drug screen was positive only for  opiates.  Blood cultures were drawn because of the slightly elevated  white count and are negative so far, but not completed.  His white count  was initially 14.7 and decreased to 13.3, but he was afebrile and  antibiotics were not initiated.   Initially, plans were for TEE cardioversion.  He was rate-controlled  with IV Cardizem, then changed to p.o.  Mr. Conly spontaneously  converted on May 03, 2009 p.m.   A 2-D echocardiogram was performed,  which showed limited sound wave transmission secondary to body habitus  (weight 254.5 kg).  Aortic valve was trileaflet and left ventricular  function appeared to be preserved with no pericardial effusion, but no  other information was available from this study.   On May 04, 2009, Mr. Cordell was ambulating without chest pain or  shortness of breath.  He was evaluated by Dr. Jens Som who felt that  outpatient pulmonary evaluation for sleep study and possible obstructive  sleep apnea was indicated.  Dr. Jens Som felt that the increased  troponin was most likely due to atrial flutter.  No ischemic eval was  possible at this time due to his size and weight.  Mr. Fout was  evaluated by Dr. Jens Som and considered stable for discharge with  outpatient followup arranged.   DISCHARGE INSTRUCTIONS:  1. His activity level is to be increased gradually.  2. He is encouraged to stick to a low-sodium heart-healthy diet.  3. He is to follow up with Dr. Jens Som on June 03, 2009 at 2:45.  4. He  is to see Dr. Shelle Iron at [facility] on May 17, 2009 at      11:45.  5. He has a Coumadin Clinic appointment on May 06, 2009 at 2:45 p.m.  6. He is to follow up with Dr. Paulino Rily as needed.   DISCHARGE MEDICATIONS:  Per the computerized med list.      Theodore Demark, PA-C      Madolyn Frieze. Jens Som, MD, [facility]  Electronically Signed    RB/MEDQ  D:  05/04/2009  T:  05/05/2009  Job:  [id number]   cc:   Emeterio Reeve, MD

## 2011-02-14 NOTE — Discharge Summary (Signed)
NAME:  [name], [name] NO.:  [id number]   MEDICAL RECORD NO.:  [id number]          PATIENT TYPE:  INP   LOCATION:  1407                         [facility]ACILITY[facility]   PHYSICIAN:  Hollice Espy, M.D.DATE OF BIRTH:  Apr 12, 1971   DATE OF ADMISSION:  03/20/2008  DATE OF DISCHARGE:  03/25/2008                               DISCHARGE SUMMARY   ATTENDING PHYSICIAN:  Dr. Virginia Rochester.   PRIMARY CARE PHYSICIAN:  Dr. Johnn Hai.   DISCHARGE DIAGNOSES:  1. Bacterial pneumonia, bilateral lower lobe is not otherwise      specified.  2. Asthma exacerbation secondary to #1.  3. Hypoxia secondary to #1 and 2.  4. Diabetes mellitus type 2 poorly controlled.  5. Steroid induced hyperglycemia.  6. Morbid obesity.  7. Hypertension not otherwise specified.   DISCHARGE MEDICATIONS:  Discharge medications for this patient are as  follows:   1. Ceftin 500 mg p.o. b.i.d. x3 days.  2. Zithromax 500 mg p.o. daily x3 days.  3. Prednisone 50 mg p.o. b.i.d. day 1, 40 mg p.o. b.i.d. day 2, and so      on until complete.  4. The patient to resume all of his previous medications as follows:      Humalog 75/25 150 units subcu b.i.d., Norvasc 10 p.o. daily, Diovan      300/25 p.o. daily, Glucophage 1000 p.o. b.i.d., albuterol inhaler      previously p.r.n. now changed to 2 puffs 4 times a day schedule      times 1 week then back to p.r.n.   HOSPITAL COURSE:  The patient is a 40 year old, white male with past  medical history of morbid obesity, hypertension, and diabetes mellitus  as well as asthma who presented with several days of worsening shortness  of breath and wheezing to the point where he could not take any more and  came into the Emergency Room.  On chest x-ray, he was found to have a  bilateral lower lobe pneumonia.  There is a question of edema, and a BNP  was found to be less than 30.  The patient was started on IV steroids,  IV antibiotics for community-acquired  pneumonia, oxygen, and nebulizer  treatments.  Over the next several days, he showed signs of improvement,  and his antibiotics were changed over to p.o.  He was attempted to be  ambulated on March 23, 2008, but he had decreased oxygen saturation.  He  was therefore kept on oxygen, and treatments were continued.  As of June  23, he ambulated in the hallways without O2.  His saturations remained  stable initially, but on the return trip down the hallway, his sats  dropped down to 82%.  The plan will be for the patient to continue in-  hospital treatment and recheck his oxygen saturations on June 24.  If  his saturations at that time drop below 88%, the plan will be set up  home health O2 for him to go home on.  The plan will be for the patient  to continue on total of 7  days of antibiotic therapy.  As of June 24, he  will have received 4 days of therapy.  He will then be discharged on a  prednisone taper.  He will follow up with his PCP, Dr. Johnn Hai,  on Friday, March 27, 2008.  His discharge diet will be a low sodium, carb  modified diet.  He was advised to increase his activity slowly.  His  overall disposition from initial presentation is  improved.  In regard to his diabetes mellitus, he was put on his  continued medications plus sliding scale.  He still had hyperglycemia  secondary to his IV steroids.  He is improved with the steroids being  tapered versus hypertension that has been under excellent control during  this hospitalization.      Hollice Espy, M.D.  Electronically Signed     SKK/MEDQ  D:  03/24/2008  T:  03/24/2008  Job:  [id number]   cc:   Johnn Hai, M.D.

## 2011-02-14 NOTE — H&P (Signed)
NAME:  [name], [name] NO.:  [id number]   MEDICAL RECORD NO.:  [id number]          PATIENT TYPE:  INP   LOCATION:  2902                         FACILITY:  [facility]   PHYSICIAN:  Christell Faith, MD   DATE OF BIRTH:  Feb 06, 1971   DATE OF ADMISSION:  05/01/2009  DATE OF DISCHARGE:                              HISTORY & PHYSICAL   PRIMARY CARE PHYSICIAN:  Dr. Mila Palmer.   CHIEF COMPLAINT:  Chest pain.   HISTORY OF PRESENT ILLNESS:  This is a 40 year old white man who has  experienced constant sharp chest pain for the past 2 days.  It is worse  with taking deep breath or coughing and is worse with certain body  positions.  For the past day he has had increased dyspnea.  He thought  his pain was indigestion, and has been taking Tums without relief.  He  denies fevers, chills or sweats.  He does complain of generalized fluid  retention.   PAST MEDICAL HISTORY:  1. Diabetes mellitus type 2.  2. Asthma.  3. Hypertension.  4. Morbid obesity.   SOCIAL HISTORY:  Lives in [place].  He lives alone.  He is recently  separated from his wife.  He is a lifelong nonsmoker, nondrinker.  He  works at a call center for [employer].   FAMILY HISTORY:  Mother and father both deceased, both alcoholics.  Mother died of liver cancer.   ALLERGIES:  1. IODINE.  2. SHELLFISH.   MEDICINES:  1. Insulin 70/30 one hundred and 50 units twice daily.  2. Glucophage 1 g b.i.d.  3. Diovan HCT 12.5/25 mg p.o. daily.  4. Norvasc daily.   REVIEW OF SYSTEMS:  Positive for chest pain, shortness of breath,  dyspnea on exertion, weakness, acid reflux, abdominal pain.   PHYSICAL EXAMINATION:  Temperature 98.8, pulse initially 158 down to  128, respiratory rate 17, blood pressure 139/84 down to 108/48,  saturations 96% on 5 liters, weight 555 pounds.  GENERAL:  This is a very obese white man who is mildly diaphoretic, but,  otherwise, appears to be resting comfortably.  Right eye  exotropia; otherwise, sclerae are clear.  NECK:  Obese.  Appears supple.  I am unable to assess neck veins or  thyroid gland.  However, there do not appear to be carotid bruits.  CARDIAC:  I am unable to auscultate heart tones.  PULMONARY:  Lungs are clear to auscultation bilaterally on anterior  exam.  ABDOMEN:  Obese, soft, nontender.  EXTREMITIES:  There is no pitting.  Toenails are chronically diseased.   DIAGNOSTIC TESTS:  Chest x-ray is limited due to body size, but appears  to show borderline cardiomegaly and pulmonary vascular congestion.  EKG  shows atrial flutter at 150 beats per minute with periods of variable  block; also shows electrical alternans.   LABORATORIES:  White blood cells 14.7, hemoglobin 14.9, platelets 250.  Sodium 139, potassium 3.9, BUN 20, creatinine 1.2.  D-dimer 0.3.  BNP  207.  Troponin 0.23, CK-MB 2.5, CK 116.   IMPRESSION:  A 40 year old white  man with chest pain that is most  consistent with myo-pericarditis.  However, he does have cardiac risk  factors, and coronary artery disease is not entirely excluded.  He is  found to be in atrial flutter with rapid ventricular response.   PLAN:  1. We will transfer to the [facility].  Will use a      diltiazem drip as his blood pressure will allow.  Initiate      anticoagulation with heparin.  He has borderline blood pressures,      and we will use digoxin for further rate control.  He is going to      need direct current cardioversion.  2. Will check surface echocardiogram.  We will try to assess for      pericardial effusion, although this may be difficult given his      size.  3. Cycle cardiac enzymes and EKGs.  He may eventually need      catheterization.  4. Diabetes control with insulin.  5. We will send blood cultures given his white count and borderline      temperature.  6. We will check TSH.  7. Trying to get his weight under control will be an important long-      term goal.   He should probably have a sleep study at some point      with CPAP titration.      Christell Faith, MD  Electronically Signed     NDL/MEDQ  D:  05/01/2009  T:  05/01/2009  Job:  [id number]

## 2011-02-14 NOTE — Consult Note (Signed)
NAME:  [name], VANWEY NO.:  [id number]   MEDICAL RECORD NO.:  [id number]          PATIENT TYPE:  INP   LOCATION:  2918                         FACILITY:  [facility]   PHYSICIAN:  Rollene Rotunda, MD, FACCDATE OF BIRTH:  1970-10-06   DATE OF CONSULTATION:  DATE OF DISCHARGE:                                 CONSULTATION   PRIMARY CARDIOLOGIST:  Madolyn Frieze. Jens Som, MD, [facility]   REASON FOR CONSULTATION:  Atrial flutter/shortness of breath.   HISTORY OF PRESENT ILLNESS:  Mr. [name] is a 40 year old male with a  known history of atrial flutter having been diagnosed at his last  admission in August 2010, as well as history significant for morbid  obesity, hypertension, asthma, diabetes mellitus, GERD, and likely  obstructive sleep apnea who presents with worsening shortness of breath  over the last several weeks, atypical chest pain, all in the setting of  recurrence of his atrial flutter and possible pneumonia.  The patient  reports feeling more or less okay for approximately 3 days after his  discharge on May 04, 2009, but then he says he developed an upper  respiratory infection.  The patient had increasing nonproductive cough  that became minimally productive with a brownish yellow sputum.  The  patient also reports fevers and chills starting approximately a week  later and continued coughing and increasing shortness of breath.  Approximately 1 week ago, the shortness of breath became significantly  worse and he started noticing a pressure-like/burning in his substernal  area.  Chest pain was constant, waxing and waning but never really  resolving over the last 6 days.  However, the patient eventually  returned for evaluation in the ED secondary to his shortness of breath  which he simply could no longer tolerate.  Upon arrival to the ED, he  was found to be hypoxic, sating in the 80s and arterial blood gas was  checked showing a pH of 7.485, pCO2 43, pO2 57 and HCO3 of  32.4 with an  acid-base excess of 8.0.  He is also found to be in atrial flutter on  telemetry with slightly elevated blood pressures but stable.  Point of  care markers are negative thus far and BNP is only 152, but white blood  cell count of 14.8 with neutrophils of 79% and absolute neutrophils of  11.7.  Chest x-ray showed persistent slight worsening of asymmetric air  space process, pulmonary edema versus infiltrate.  The patient was put  on 4 liters while the patient is still only sating in the low 90s.  He  reports his shortness of breath significantly improved.  At the eval by  cardiology, the patient's heart rate was ranging anywhere from the 90s  to 150s, although generally in the low 100s.   PAST MEDICAL HISTORY:  1. Atrial flutter diagnosed on May 01, 2009, anticoagulated on      Coumadin.  2. Diabetes mellitus.  3. Hypertension.  4. Morbid obesity.  5. Asthma.  6. GERD.  7. Likely obstructive sleep apnea.   SOCIAL HISTORY:  The patient lives in [place]  alone.  He works full-  time for [employer].  He has no significant tobacco, EtOH, or illicit drug use  history.  He does occasionally take Echinacea and B12 supplements.  He  has a regular diet and has not been regularly exercising over the last 3  months.   FAMILY HISTORY:  Mother positive history of CAD in her 32s and father  positive history in his 53s, both lived to 67s but both currently  deceased.  The patient has several siblings, none of which have CAD.   REVIEW OF SYSTEMS:  Please see HPI.  All other systems reviewed and were  negative.   CODE STATUS:  Full.   ALLERGIES:  1. IODINE.  2. SHELLFISH.   MEDICATIONS:  (Home)  1. Coumadin 5 mg p.o. daily.  2. Coreg 6.25 mg p.o. b.i.d.  3. Metformin 1 gram p.o. b.i.d.  4. Diltiazem 360 mg p.o. daily.  5. Digoxin 0.25 mg p.o. daily.  6. Insulin 75/25 150 subcu injection b.i.d.   So far in the hospital the patient was given ceftriaxone 1 g injection,   azithromycin 500 mg x1, albuterol nebulizer, and enalapril 1.25 mg IV  x1.   PHYSICAL EXAMINATION:  VITAL SIGNS:  Temperature not recorded, BP  ranging in the 120s-150s systolic, 70s diastolic, respiration rate 10s  to the high 40s, pulse anywhere from 80s-150s, O2 saturation 80s to mid  90s on 4 liters by nasal cannula.  Weight (last admission 254.5 kg).  GENERAL:  The patient is dyspneic with minimal exertion and/or full  sentences.  HEENT:  Head is normocephalic, atraumatic.  Pupils equal, round,  reactive to light.  Extraocular muscles are intact.  Nares are patent  without discharge.  Dentition is fair.  Oropharynx without erythema or  exudates.  NECK:  Supple without lymphadenopathy.  No obvious thyromegaly.  No  bruits.  Impossible to assess for JVD given body habitus.  LUNGS:  Clear to auscultation bilaterally with decreased breath sounds  at the bases.  HEART:  Heart sounds are not able to be appreciated likely secondary to  body habitus (thick chest wall).  SKIN:  No rashes, lesions or petechiae.  ABDOMEN:  Soft, nontender, normal abdominal bowel sounds.  No rebound or  guarding.  Unable to assess for hepatosplenomegaly or pulsations  secondary to body habitus as the patient is extremely obese.  EXTREMITIES:  Pulses are 2+ in radials bilaterally, unable to appreciate  dorsalis pedis or posterior tibial pulses.  No clubbing or cyanosis.  1+  pitting edema bilaterally, ?greater nonpitting edema.  MUSCULOSKELETAL:  No joint deformity or effusions.  No spinal or CVA  tenderness.  NEUROLOGIC:  Cranial nerves II-XII grossly intact.  Strength 5/5 in all  extremities and axial groups.  Normal sensation throughout and normal  cerebellar function.   RADIOLOGY:  Chest x-ray shows persistent slight worsening of asymmetric  air space process, pulmonary edema versus infiltrate.   EKG, none available.   LABORATORY DATA:  WBC 14.8 with neutrophils 79%.  HGB 13.2, HCT 38.9,  PLT count  255.  Sodium 141, potassium 3.4, chloride 102, CO2 29, BUN 7,  creatinine 0.9, glucose 112, BNP 152.  Point-of-care markers are  negative x1.  INR 1.2.  ABG (see HPI).   ASSESSMENT AND PLAN:  [name] is a 40 year old male with the above-  noted medical history who presents with worsening dyspnea on exertion  and now shortness of breath at rest since a few days after his discharge  on  May 04, 2009, in the setting of recurrence of his  fibrillation/flutter and pneumonia versus volume overload.  Appears to  be back in atrial fibrillation with rapid rate per Dr. Jenene Slicker  assessment and mild bump to his BNP.  Chest x-ray, edema versus  infiltrates with extremely difficult exam given body habitus.  1. Dyspnea.  I agree to treat pneumonia but would also treat with      diuretics given the possibility that minimum BNP elevation is not a      good indicator given the patient's size of volume overload.  2. Atrial fibrillation/flutter.  We will continue his home medications      but we will need TEE DCCV if he does not convert.  Resume Coumadin      per pharmacy.   Thank you for the consult.  Primary cardiologist to see tomorrow if  available.      Jarrett Ables, [facility]      Rollene Rotunda, MD, [facility]  Electronically Signed    MS/MEDQ  D:  05/25/2009  T:  05/26/2009  Job:  [id number]

## 2011-02-14 NOTE — H&P (Signed)
NAME:  [name], [name] NO.:  [id number]   MEDICAL RECORD NO.:  [id number]          PATIENT TYPE:  INP   LOCATION:  0101                         FACILITY:  [facility]   PHYSICIAN:  Hollice Espy, M.D.DATE OF BIRTH:  09-09-1971   DATE OF ADMISSION:  03/20/2008  DATE OF DISCHARGE:                              HISTORY & PHYSICAL   PRIMARY CARE PHYSICIAN:  Emeterio Reeve, M.D.   CHIEF COMPLAINT:  Shortness of breath.   HISTORY OF PRESENT ILLNESS:  The patient is a 40 year old white male  with past medical history of obesity, hypertension, diabetes and asthma  who for the last 3 days has been complaining of progressively worsening  shortness of breath and wheezing.  He got to the point where he was so  short of breath he  could barely move, and he came in to the emergency  room for further evaluation.  In the emergency room he had a chest x-ray  done which showed asymmetric air space disease, right greater than left,  versus pulmonary edema.  Labs were drawn, and he was found to have a  normal BNP, and this was felt to be secondary to pneumonia with some  asthma exacerbation.  Other labs noted a white count of 11.9 and a CBG  of 291.  The patient was given nebulizer treatments as well as a dose of  Rocephin and Zithromax.  The patient stated he was  feeling better.  He  denies any headaches, vision changes, dysphagia.  No chest pain or  palpitations.  He does complain of some continued shortness of breath  and wheezing but better.  He has a nonproductive cough.  No abdominal  pain.  No hematuria, dysuria, constipation, diarrhea, focal extremity  numbness, weakness or pain.   REVIEW OF SYSTEMS:  Otherwise negative.   PAST MEDICAL HISTORY:  Includes diabetes mellitus, hypertension,  obesity.   MEDICATIONS:  He is on Glucophage 1000 p.o. b.i.d., Norvasc 10 b.i.d.,  Diovan  320/25 p.o. daily, insulin 75/25, 150 units q.12 h.   ALLERGIES:  SHELLFISH AND IV DYE.   SOCIAL HISTORY:  He denies any tobacco, alcohol or drug use.   FAMILY HISTORY:  Noncontributory.   PHYSICAL EXAMINATION:  VITALS ON ADMISSION:  Temperature 98.3, heart  rate 102, now down to 96.  Blood pressure 175/89, as high as 180/77,  respirations 24, now down to 18.  The O2 sat was 87% on room air, 99% on  3 liters.  GENERAL:  He is alert and oriented x 3, in no apparent distress.  HEENT:  Normocephalic, atraumatic.  His mucous membranes are dry.  He has a  narrow airway.  HEART:  Regular rate and rhythm, S1 and S2.  LUNGS:  He has bilateral expiratory wheezing throughout and decreased  secondary to body habitus.  ABDOMEN:  Soft, obese, nontender, positive bowel sounds.  EXTREMITIES:  Show no clubbing, cyanosis.  There is 1+ pitting edema.   LABORATORY WORK:  Chest x-ray is as per HPI.  BNP less than 30.  White  count 11.9, 76% neutrophils, H&H  13.7 and 41, MCV 81, platelet count  227.  Sodium 141, potassium 3.6, chloride 102, bicarb 31, BUN 7,  creatinine 0.8, glucose 291.  LFTs are noted for an albumin of 3.1.  CPK  61.1, MB is 1.  Troponin I less than 0.05.   ASSESSMENT/PLAN:  1. Bibasilar pneumonia.  2. Asthma exacerbation.  We will treat the patient with q.6 h      nebulizers, IV Solu-Medrol, oxygen and antibiotics for pneumonia.  3. Hypertension.  4. Diabetes mellitus.  We will continue his medications plus sliding      scale.  5. Obesity.  6. Protein calorie malnutrition secondary from recent illness.      Hollice Espy, M.D.  Electronically Signed     SKK/MEDQ  D:  03/20/2008  T:  03/20/2008  Job:  [id number]   cc:   Emeterio Reeve, MD  Fax: [phone]

## 2011-02-14 NOTE — Consult Note (Signed)
[facility]                          ENDOCRINOLOGY CONSULTATION   TANK, DIFIORE                         MRN:          [id number]  DATE:02/14/2007                            DOB:          [birth year]    REFERRING PHYSICIAN:  Barbette Hair. Artist Pais, DO   REASON FOR REFERRAL:  Diabetes.   HISTORY OF PRESENT ILLNESS:  A 40 year old man who reports a 6-year  history of diabetes.  He is unaware of any chronic complications of  diabetes.  He has been on insulin x6 years and currently takes 75/25  insulin 150 units twice a day.  He describes his diet and exercise as  fair.  He states his glucoses are persistently above 200 and  occasionally over 300.  Symptomatically, he has 1 year of a severe  weight loss of 80 pounds and slight associated numbness of the feet.   PAST MEDICAL HISTORY:  1. Hypertension.  2. Depression.  3. Bariatric surgery has been considered and he states Dr. Artist Pais has      referred him for this.   SOCIAL HISTORY:  He has been widowed x6 years.  He works for a Camera operator.   FAMILY HISTORY:  Positive for diabetes in his father and three siblings.   REVIEW OF SYSTEMS:  He has hypoglycemia twice a day, usually before  lunch or before supper.  He denies chest pain and shortness of breath.   PHYSICAL EXAMINATION:  VITAL SIGNS:  Blood pressure 168/91, heart rate  78, temperature 97.  The weight is in excess of 400 pounds.  Height 6  feet 3 inches.  GENERAL:  Morbid obesity in no distress.  SKIN:  Not diaphoretic, no rash.  HEENT:  No proptosis.  No periorbital swelling.  Pharynx is normal.  NECK:  No goiter.  CHEST:  Clear to auscultation, no respiratory distress.  CARDIAC:  1+ bilateral pretibial edema, regular rate and rhythm with no  murmur.  Pedal pulses are absent, probably due to the edema.  There is  no bruit at the carotid arteries.  EXTREMITIES:  Feet with bilateral onychomycosis.  The feet are otherwise  of normal  color and temperature.  There is no ulcer present in the feet.  NEUROLOGIC:  Alert and oriented.  Does not appear anxious nor depressed.  Sensation is intact to touch in the feet, but decreased from normal.   LABORATORY DATA AND X-RAY FINDINGS:  On January 11, 2007, hemoglobin A1c  9.4.   IMPRESSION:  1. Insulin-requiring type 2 diabetes, complicated by severe insulin      resistance.  2. Morbid obesity compromises his therapy.  3. Edema caused by or contributed to by the insulin plus Actos.  4. Slight numbness of the feet.   PLAN:  1. We discussed the important risk of diabetes and the importance of      diet and exercise therapy.  I have told him that he should pursue      the bariatric surgery.  2. Discontinue Actos.  3. Change 75/25 insulin to 130 units q.a.m. and 170 units q.p.m.  4. Return in about 30 days.     Sean A. Everardo All, MD  Electronically Signed    SAE/MedQ  DD: 02/17/2007  DT: 02/18/2007  Job #: [id number]   cc:   Barbette Hair. Artist Pais, DO

## 2011-02-17 NOTE — Assessment & Plan Note (Signed)
[facility]                           PRIMARY CARE OFFICE NOTE   [name], [name]                         MRN:          [id number]  DATE:01/11/2007                            DOB:          [birth year]    CHIEF COMPLAINT:  New patient to practice.   HISTORY OF PRESENT ILLNESS:  The patient is a 40 year old, African-  American male her to establish primary care. He was formerly followed by  [facility] in [place]. His past medical history is  significant for severe morbid obesity complicated by type 2 diabetes  which was originally diagnosed in May 2007. The patient notes he was  hospitalized with severe hyperglycemia with blood sugars above 900. He  was started on insulin at that time and since then has added oral agents  including Glucophage and Actos. He has been referred to Dr. Lucianne Muss, an  endocrinologist, however, he has not followed up on a regular basis. His  diabetic status is currently unknown. He currently takes 150 units of  Humalog 75/25 twice a day. He does note occasional episodes of  hypoglycemia with blood sugars in the 50s and 60s.   He has also had very difficult to control high blood pressure. Currently  he is on lisinopril 12.5 mg twice a day and Norvasc 10 mg once a day. He  states he does take his medications on a regular basis.   He denies any history of heart disease or stroke. He has not been tested  for obstructive sleep apnea in the past.   In regards to his morbid obesity, his previous doctor did discuss the  possibility of gastric bypass versus lap band. He has not seen a surgeon  in consultation regarding this matter.   PAST MEDICAL HISTORY:  1. Morbid obesity.  2. Type 2 diabetes.  3. Hypertension.  4. History of depression.  5. History of childhood asthma.  6. Status post tonsillectomy in 1998.   CURRENT MEDICATIONS:  1. Humalog 75/25, 150 units b.i.d.  2. Glucophage 1000 mg b.i.d.  3. Actos 50  mg once a day.  4. Lisinopril 12.5 mg b.i.d.  5. Wellbutrin SR 150 mg b.i.d.  6. Norvasc 10 mg once a day.   ALLERGIES:  IODINE/SHELLFISH which causes anaphylaxis and hives.   SOCIAL HISTORY:  The patient is widowed. His wife died secondary to  complications of polymyositis and a stroke. He does not have any  children. Currently he works as a Company secretary at [employer].   FAMILY HISTORY:  Mother and father are both deceased, mother at age 25  secondary to liver cancer, she was diabetic. Father died at 55 secondary  to complications of stroke and he was also diabetic. The patient has  another sister who has morbid obesity and severe diabetes.   HABITS:  He seldom drinks. No history of tobacco abuse.   REVIEW OF SYSTEMS:  Has bilateral ear discomfort, nasal congestion.  Denies any history of chest pain, shortness of breath. Does admit to  snoring and daytime somnolence. Denies any heartburn, nausea, vomiting,  constipation  or diarrhea. All other systems negative. 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 It has been more than 1 year since his  last diabetic eye exam.   PHYSICAL EXAMINATION:  VITAL SIGNS:  Weight is unobtainable with our  scales. Temperature is 96.3, pulse is 88, blood pressure is 175/96 in  the left arm in the seated position.  GENERAL:  The patient is pleasant, morbidly obese, 40 year old African-  American male in no apparent distress.  HEENT:  Normocephalic, atraumatic. The patient has ptosis of the right  eyelid. Pupils were equal and reactive to light bilaterally. Positive  nystagmus. External auditory canals showed bilateral cerumen impaction.  Oral pharyngeal exam was unremarkable but did have a tight upper airway.  NECK:  Thick, no adenopathy, carotid bruit or thyromegaly.  CHEST:  Normal inspiratory effort. This was clear to auscultation  bilaterally. No rhonchi, rales or wheezes.  CARDIOVASCULAR:  Regular rate and rhythm. Heart sounds  were distant. No  murmurs, rubs or gallops appreciated.  ABDOMEN:  Obese, nontender, positive bowel sounds. No organomegaly.  MUSCULOSKELETAL:  The patient has trace edema. I was able to sense  vibration of his lower extremities. Dorsalis pedis pulses were somewhat  diminished.  NEUROLOGIC:  Cranial nerves II-XII grossly intact. Nonfocal.   IMPRESSION/RECOMMENDATIONS:  1. Morbid obesity.  2. Type 2 diabetes with insulin resistance and episodes of      hypoglycemia  3. Hypertension uncontrolled.  4. History of depression.  5. Bilateral cerumen impaction.  6. Health maintenance.   RECOMMENDATIONS:  1. The patient will decrease his Humalog 75/25 to 125 units b.i.d. We      will order a followup A1c and microalbuminuria/creatinine ratio. I      recommended continuing his Glucophage and Actos but will refer to      Dr. Everardo All for followup regarding type 2 diabetes.  2. Due to his severe morbid obesity, I recommended referral to [facility] for possible lap band surgery.  3. In terms of his blood pressure, we will discontinue his Lisinopril      and start Diovan 160 mg once a day. This may need further      titration. He will followup with [place] in approximately 6 weeks.  4. Lastly, he will be referred to [facility] for a followup      diabetic eye exam. Followup time is 4-6 weeks.     Barbette Hair. Artist Pais, DO  Electronically Signed    RDY/MedQ  DD: 01/11/2007  DT: 01/11/2007  Job #: [id number]

## 2011-03-08 ENCOUNTER — Other Ambulatory Visit: Payer: Self-pay | Admitting: Cardiology

## 2011-03-08 ENCOUNTER — Telehealth: Payer: Self-pay | Admitting: Cardiology

## 2011-03-08 ENCOUNTER — Other Ambulatory Visit (INDEPENDENT_AMBULATORY_CARE_PROVIDER_SITE_OTHER): Payer: 59

## 2011-03-08 ENCOUNTER — Ambulatory Visit (INDEPENDENT_AMBULATORY_CARE_PROVIDER_SITE_OTHER)
Admission: RE | Admit: 2011-03-08 | Discharge: 2011-03-08 | Disposition: A | Discharge status: Home / Self Care | Payer: 59 | Source: Ambulatory Visit | Attending: Cardiology | Admitting: Cardiology

## 2011-03-08 DIAGNOSIS — Z79899 Other long term (current) drug therapy: Secondary | ICD-10-CM

## 2011-03-08 DIAGNOSIS — I4891 Unspecified atrial fibrillation: Secondary | ICD-10-CM

## 2011-03-08 LAB — HEPATIC FUNCTION PANEL
AST: 19 U/L (ref 0–37)
Albumin: 3.9 g/dL (ref 3.5–5.2)
Alkaline Phosphatase: 63 U/L (ref 39–117)
Bilirubin, Direct: 0.2 mg/dL (ref 0.0–0.3)
Total Protein: 7.5 g/dL (ref 6.0–8.3)

## 2011-03-08 NOTE — Telephone Encounter (Signed)
Pt is in the office for chest xray. Pt does not have any orders. Belenda Cruise would like to speak re orders. Pt is waiting in the office. Walker Kehr Elam ext # [phone]

## 2011-03-08 NOTE — Telephone Encounter (Signed)
Spoke with [name], order placed for chest xray [name]

## 2011-03-15 ENCOUNTER — Telehealth: Payer: Self-pay | Admitting: Cardiology

## 2011-03-15 NOTE — Telephone Encounter (Signed)
Returning call back to christine 

## 2011-03-15 NOTE — Telephone Encounter (Signed)
PT AWARE OF LAB RESULTS./CY 

## 2011-04-13 ENCOUNTER — Encounter: Payer: Self-pay | Admitting: Cardiology

## 2011-04-20 ENCOUNTER — Ambulatory Visit: Payer: 59 | Admitting: Cardiology

## 2011-05-10 ENCOUNTER — Telehealth: Payer: Self-pay | Admitting: Internal Medicine

## 2011-05-10 ENCOUNTER — Other Ambulatory Visit: Payer: Self-pay | Admitting: *Deleted

## 2011-05-10 MED ORDER — METOPROLOL SUCCINATE ER 50 MG PO TB24: 50.0000 mg | ORAL_TABLET | Freq: Every day | ORAL | Status: DC

## 2011-05-10 NOTE — Telephone Encounter (Signed)
Pt overdue for appt.  Called and scheduled appt for 8/9.  Pt must come to appt before medication is refilled. He is aware.

## 2011-05-10 NOTE — Telephone Encounter (Signed)
Pt needs warfarin to be call in to [facility].

## 2011-05-11 ENCOUNTER — Ambulatory Visit (INDEPENDENT_AMBULATORY_CARE_PROVIDER_SITE_OTHER): Payer: 59 | Admitting: *Deleted

## 2011-05-11 DIAGNOSIS — Z7901 Long term (current) use of anticoagulants: Secondary | ICD-10-CM

## 2011-05-11 DIAGNOSIS — I4891 Unspecified atrial fibrillation: Secondary | ICD-10-CM

## 2011-05-11 DIAGNOSIS — I4892 Unspecified atrial flutter: Secondary | ICD-10-CM

## 2011-05-11 MED ORDER — WARFARIN SODIUM 10 MG PO TABS: ORAL_TABLET | ORAL | Status: DC

## 2011-05-25 ENCOUNTER — Encounter: Payer: 59 | Admitting: *Deleted

## 2011-06-27 LAB — DIFFERENTIAL
Basophils Absolute: 0.1
Basophils Relative: 1
Eosinophils Absolute: 0.3
Eosinophils Relative: 2
Monocytes Absolute: 0.6
Monocytes Relative: 5
Neutro Abs: 8.7 — ABNORMAL HIGH

## 2011-06-27 LAB — B-NATRIURETIC PEPTIDE (CONVERTED LAB): Pro B Natriuretic peptide (BNP): 39.1

## 2011-06-27 LAB — CBC
Hemoglobin: 12.4 — ABNORMAL LOW
MCHC: 33.8
MCV: 82
RBC: 4.46
RDW: 14.8

## 2011-06-27 LAB — BASIC METABOLIC PANEL
CO2: 29
Calcium: 8.2 — ABNORMAL LOW
Chloride: 103
Glucose, Bld: 293 — ABNORMAL HIGH
Sodium: 139

## 2011-06-27 LAB — POCT CARDIAC MARKERS: Myoglobin, poc: 64.7

## 2011-06-27 LAB — D-DIMER, QUANTITATIVE: D-Dimer, Quant: 0.41

## 2011-06-29 LAB — CBC
HCT: 40.5
Hemoglobin: 13.7
MCHC: 33.5
MCHC: 33.7
MCV: 81.2
MCV: 81.7
Platelets: 217
Platelets: 223
Platelets: 227
RBC: 5.19
RDW: 15.4
RDW: 15.5
WBC: 13 — ABNORMAL HIGH
WBC: 14.4 — ABNORMAL HIGH

## 2011-06-29 LAB — LIPID PANEL
HDL: 33 — ABNORMAL LOW
Total CHOL/HDL Ratio: 4.8

## 2011-06-29 LAB — DIFFERENTIAL
Basophils Absolute: 0.1
Basophils Relative: 0
Eosinophils Absolute: 0
Eosinophils Absolute: 0.1
Eosinophils Relative: 0
Eosinophils Relative: 1
Lymphocytes Relative: 17
Lymphs Abs: 1.5
Lymphs Abs: 2
Monocytes Absolute: 0.7
Monocytes Relative: 3
Monocytes Relative: 6
Neutro Abs: 9 — ABNORMAL HIGH
Neutrophils Relative %: 76

## 2011-06-29 LAB — COMPREHENSIVE METABOLIC PANEL
ALT: 14
AST: 16
Albumin: 3.1 — ABNORMAL LOW
BUN: 7
Calcium: 8.7
Calcium: 9.4
Creatinine, Ser: 0.76
GFR calc Af Amer: >60
Glucose, Bld: 291 — ABNORMAL HIGH
Sodium: 139
Total Protein: 6.7
Total Protein: 6.9

## 2011-06-29 LAB — BASIC METABOLIC PANEL
BUN: 7
Calcium: 8.8
Chloride: 102
Creatinine, Ser: 0.72

## 2011-06-29 LAB — POCT CARDIAC MARKERS
CKMB, poc: 1
Myoglobin, poc: 61.1
Troponin i, poc: <0.05

## 2011-06-29 LAB — HEMOGLOBIN A1C
Hgb A1c MFr Bld: 10.6 — ABNORMAL HIGH
Mean Plasma Glucose: 300

## 2011-06-29 LAB — D-DIMER, QUANTITATIVE: D-Dimer, Quant: 0.51 — ABNORMAL HIGH

## 2011-06-29 LAB — GLUCOSE, RANDOM: Glucose, Bld: 372 — ABNORMAL HIGH

## 2011-07-03 LAB — GLUCOSE, CAPILLARY: Glucose-Capillary: 85

## 2011-08-08 ENCOUNTER — Encounter: Payer: Self-pay | Admitting: Internal Medicine

## 2011-10-12 ENCOUNTER — Encounter (HOSPITAL_COMMUNITY): Payer: Self-pay

## 2011-10-12 ENCOUNTER — Inpatient Hospital Stay (HOSPITAL_COMMUNITY)
Admission: EM | Admit: 2011-10-12 | Discharge: 2011-10-19 | DRG: 603 | Disposition: A | Discharge status: Home / Self Care | Payer: 59 | Attending: Internal Medicine | Admitting: Internal Medicine

## 2011-10-12 DIAGNOSIS — I509 Heart failure, unspecified: Secondary | ICD-10-CM

## 2011-10-12 DIAGNOSIS — J40 Bronchitis, not specified as acute or chronic: Secondary | ICD-10-CM

## 2011-10-12 DIAGNOSIS — E119 Type 2 diabetes mellitus without complications: Secondary | ICD-10-CM

## 2011-10-12 DIAGNOSIS — G4730 Sleep apnea, unspecified: Secondary | ICD-10-CM

## 2011-10-12 DIAGNOSIS — H5500 Unspecified nystagmus: Secondary | ICD-10-CM

## 2011-10-12 DIAGNOSIS — I10 Essential (primary) hypertension: Secondary | ICD-10-CM

## 2011-10-12 DIAGNOSIS — L03119 Cellulitis of unspecified part of limb: Secondary | ICD-10-CM

## 2011-10-12 DIAGNOSIS — I4892 Unspecified atrial flutter: Secondary | ICD-10-CM

## 2011-10-12 DIAGNOSIS — J9600 Acute respiratory failure, unspecified whether with hypoxia or hypercapnia: Secondary | ICD-10-CM

## 2011-10-12 DIAGNOSIS — R079 Chest pain, unspecified: Secondary | ICD-10-CM

## 2011-10-12 DIAGNOSIS — L02419 Cutaneous abscess of limb, unspecified: Principal | ICD-10-CM

## 2011-10-12 DIAGNOSIS — R0602 Shortness of breath: Secondary | ICD-10-CM

## 2011-10-12 DIAGNOSIS — Z6841 Body Mass Index (BMI) 40.0 and over, adult: Secondary | ICD-10-CM

## 2011-10-12 DIAGNOSIS — Z7901 Long term (current) use of anticoagulants: Secondary | ICD-10-CM

## 2011-10-12 DIAGNOSIS — I4891 Unspecified atrial fibrillation: Secondary | ICD-10-CM

## 2011-10-12 DIAGNOSIS — H02409 Unspecified ptosis of unspecified eyelid: Secondary | ICD-10-CM

## 2011-10-12 DIAGNOSIS — J309 Allergic rhinitis, unspecified: Secondary | ICD-10-CM

## 2011-10-12 DIAGNOSIS — F329 Major depressive disorder, single episode, unspecified: Secondary | ICD-10-CM

## 2011-10-12 DIAGNOSIS — N179 Acute kidney failure, unspecified: Secondary | ICD-10-CM

## 2011-10-12 DIAGNOSIS — R7309 Other abnormal glucose: Secondary | ICD-10-CM

## 2011-10-12 DIAGNOSIS — G4733 Obstructive sleep apnea (adult) (pediatric): Secondary | ICD-10-CM

## 2011-10-12 DIAGNOSIS — F3289 Other specified depressive episodes: Secondary | ICD-10-CM

## 2011-10-12 HISTORY — DX: Cellulitis of unspecified part of limb: L03.119

## 2011-10-12 HISTORY — DX: Unspecified visual disturbance: H53.9

## 2011-10-12 LAB — CBC
HCT: 43.3 % (ref 39.0–52.0)
Hemoglobin: 15 g/dL (ref 13.0–17.0)
MCH: 27.6 pg (ref 26.0–34.0)
MCHC: 34.6 g/dL (ref 30.0–36.0)
RBC: 5.43 MIL/uL (ref 4.22–5.81)

## 2011-10-12 LAB — URINE MICROSCOPIC-ADD ON

## 2011-10-12 LAB — URINALYSIS, ROUTINE W REFLEX MICROSCOPIC
Glucose, UA: NEGATIVE mg/dL
Specific Gravity, Urine: 1.016 (ref 1.005–1.030)
Urobilinogen, UA: 1 mg/dL (ref 0.0–1.0)
pH: 5 (ref 5.0–8.0)

## 2011-10-12 LAB — CULTURE, ROUTINE-ABSCESS

## 2011-10-12 LAB — BASIC METABOLIC PANEL
BUN: 22 mg/dL (ref 6–23)
CO2: 23 mEq/L (ref 19–32)
GFR calc non Af Amer: 89 mL/min — ABNORMAL LOW (ref >90)
Glucose, Bld: 234 mg/dL — ABNORMAL HIGH (ref 70–99)
Potassium: 3.6 mEq/L (ref 3.5–5.1)

## 2011-10-12 LAB — POCT I-STAT TROPONIN I: Troponin i, poc: 0.02 ng/mL (ref 0.00–0.08)

## 2011-10-12 LAB — DIFFERENTIAL
Basophils Relative: 0 % (ref 0–1)
Eosinophils Absolute: 0.2 10*3/uL (ref 0.0–0.7)
Eosinophils Relative: 1 % (ref 0–5)
Lymphocytes Relative: 18 % (ref 12–46)
Neutro Abs: 11.4 10*3/uL — ABNORMAL HIGH (ref 1.7–7.7)

## 2011-10-12 LAB — PROTIME-INR
INR: 1.16 (ref 0.00–1.49)
Prothrombin Time: 15 seconds (ref 11.6–15.2)

## 2011-10-12 MED ORDER — OXYCODONE-ACETAMINOPHEN 5-325 MG PO TABS: 1.0000 | ORAL_TABLET | Freq: Four times a day (QID) | ORAL | Status: DC | PRN

## 2011-10-12 MED ORDER — SODIUM CHLORIDE 0.9 % IV BOLUS (SEPSIS)
1000.0000 mL | Freq: Once | INTRAVENOUS | Status: AC
  Administered 2011-10-12: 1000 mL via INTRAVENOUS

## 2011-10-12 MED ORDER — ONDANSETRON HCL 4 MG/2ML IJ SOLN
4.0000 mg | Freq: Once | INTRAMUSCULAR | Status: AC
  Administered 2011-10-12: 4 mg via INTRAVENOUS
  Filled 2011-10-12: qty 2

## 2011-10-12 MED ORDER — METOPROLOL TARTRATE 25 MG PO TABS
ORAL_TABLET | ORAL | Status: AC
  Filled 2011-10-12: qty 2

## 2011-10-12 MED ORDER — METOPROLOL SUCCINATE ER 50 MG PO TB24
50.0000 mg | ORAL_TABLET | Freq: Once | ORAL | Status: AC
  Administered 2011-10-13: 50 mg via ORAL
  Filled 2011-10-12: qty 1

## 2011-10-12 MED ORDER — CLINDAMYCIN PHOSPHATE 600 MG/50ML IV SOLN
600.0000 mg | Freq: Once | INTRAVENOUS | Status: AC
  Administered 2011-10-12: 600 mg via INTRAVENOUS
  Filled 2011-10-12: qty 50

## 2011-10-12 MED ORDER — FENTANYL CITRATE 0.05 MG/ML IJ SOLN
50.0000 ug | Freq: Once | INTRAMUSCULAR | Status: AC
  Administered 2011-10-12: 50 ug via INTRAVENOUS
  Filled 2011-10-12: qty 2

## 2011-10-12 NOTE — ED Notes (Signed)
[place] sandwich and diet ginger ale provided. Pt will be NPO after midnite.

## 2011-10-12 NOTE — H&P (Addendum)
[name] is an 41 y.o. male.   PCP -Dr.Sharon Laurine Blazer Chief Complaint: Swelling in the right thigh. HPI: 41 year-old male with history of diabetes mellitus type 2, atrial fibrillation, hypertension, morbid obesity, OSA had gotten his PCP today because of worsening swelling in his right thigh with pain and discharge. He noticed a small lesion 3 weeks ago on his medial aspect of his right thigh which gradually worsened in the last 48 hours was causing pain and discharge. His PCP referred him to the ER. Surgeon on call Dr.Cornett has already evaluated the patient and is planning to take him to the OR tomorrow for debridement. Hospitalist has been requested admission. Patient has had some subjective feeling of fever and chills. Denies any nausea vomiting abdominal pain chest pain shortness of breath. Patient has not been able to take his medications for last 3 weeks due to financial issues. But he was taking his insulin for last one week. He has not taken his Coumadin for over a year.  Past Medical History  Diagnosis Date  . CHF (congestive heart failure)   . HTN (hypertension)   . Ptosis   . Atrial flutter   . Atrial fibrillation   . Morbid obesity   . DM2 (diabetes mellitus, type 2)   . Depression   . Respiratory failure   . Chronic low back pain   . Diabetes mellitus     Past Surgical History  Procedure Date  . Tonsillectomy   . Cholecystectomy     Family History  Problem Relation Age of Onset  . Coronary artery disease Mother     28s; father had it in 15s; both deceased in their 66s     Social History:  reports that he has never smoked. He does not have any smokeless tobacco history on file. He reports that he does not drink alcohol or use illicit drugs.  Allergies:  Allergies  Allergen Reactions  . Iodine     Medications Prior to Admission  Medication Dose Route Frequency Provider Last Rate Last Dose  . clindamycin (CLEOCIN) IVPB 600 mg  600 mg Intravenous Once Benny Lennert, MD   600 mg at 10/12/11 2151  . fentaNYL (SUBLIMAZE) injection 50 mcg  50 mcg Intravenous Once Benny Lennert, MD   50 mcg at 10/12/11 2159  . metoprolol (TOPROL-XL) 24 hr tablet 50 mg  50 mg Oral Once Eduard Clos      . ondansetron (ZOFRAN) injection 4 mg  4 mg Intravenous Once Benny Lennert, MD   4 mg at 10/12/11 2159  . oxyCODONE-acetaminophen (PERCOCET) 5-325 MG per tablet 1-2 tablet  1-2 tablet Oral Q6H PRN Benny Lennert, MD      . sodium chloride 0.9 % bolus 1,000 mL  1,000 mL Intravenous Once Benny Lennert, MD   1,000 mL at 10/12/11 2150   Medications Prior to Admission  Medication Sig Dispense Refill  . albuterol (VENTOLIN HFA) 108 (90 BASE) MCG/ACT inhaler Inhale 2 puffs into the lungs every 6 (six) hours as needed. Shortness of breath      . furosemide (LASIX) 40 MG tablet Take 40 mg by mouth 2 (two) times daily.       Marland Kitchen ibuprofen (ADVIL,MOTRIN) 200 MG tablet Take 200 mg by mouth every 6 (six) hours as needed. For pain      . lisinopril (PRINIVIL,ZESTRIL) 20 MG tablet Take 20 mg by mouth daily.        . metoprolol (  TOPROL-XL) 50 MG 24 hr tablet Take 50 mg by mouth daily.      . pravastatin (PRAVACHOL) 20 MG tablet Take 40 mg by mouth at bedtime.       Marland Kitchen warfarin (COUMADIN) 10 MG tablet Take 10-15 mg by mouth daily. Monday, Wednesday, Friday 15mg ; Sunday, Tuesday, Thursday, Saturday 10mg         Results for orders placed during the hospital encounter of 10/12/11 (from the past 48 hour(s))  CBC     Status: Abnormal   Collection Time   10/12/11  7:23 PM      Component Value Range Comment   WBC 15.7 (*) 4.0 - 10.5 (K/uL)    RBC 5.43  4.22 - 5.81 (MIL/uL)    Hemoglobin 15.0  13.0 - 17.0 (g/dL)    HCT 16.1  09.6 - 04.5 (%)    MCV 79.7  78.0 - 100.0 (fL)    MCH 27.6  26.0 - 34.0 (pg)    MCHC 34.6  30.0 - 36.0 (g/dL)    RDW 40.9  81.1 - 91.4 (%)    Platelets 232  150 - 400 (K/uL)   DIFFERENTIAL     Status: Abnormal   Collection Time   10/12/11  7:23 PM       Component Value Range Comment   Neutrophils Relative 73  43 - 77 (%)    Lymphocytes Relative 18  12 - 46 (%)    Monocytes Relative 8  3 - 12 (%)    Eosinophils Relative 1  0 - 5 (%)    Basophils Relative 0  0 - 1 (%)    Neutro Abs 11.4 (*) 1.7 - 7.7 (K/uL)    Lymphs Abs 2.8  0.7 - 4.0 (K/uL)    Monocytes Absolute 1.3 (*) 0.1 - 1.0 (K/uL)    Eosinophils Absolute 0.2  0.0 - 0.7 (K/uL)    Basophils Absolute 0.0  0.0 - 0.1 (K/uL)    Smear Review MORPHOLOGY UNREMARKABLE     BASIC METABOLIC PANEL     Status: Abnormal   Collection Time   10/12/11  7:23 PM      Component Value Range Comment   Sodium 136  135 - 145 (mEq/L)    Potassium 3.6  3.5 - 5.1 (mEq/L)    Chloride 101  96 - 112 (mEq/L)    CO2 23  19 - 32 (mEq/L)    Glucose, Bld 234 (*) 70 - 99 (mg/dL)    BUN 22  6 - 23 (mg/dL)    Creatinine, Ser 7.82  0.50 - 1.35 (mg/dL)    Calcium 9.3  8.4 - 10.5 (mg/dL)    GFR calc non Af Amer 89 (*) >90 (mL/min)    GFR calc Af Amer >90  >90 (mL/min)   URINALYSIS, ROUTINE W REFLEX MICROSCOPIC     Status: Abnormal   Collection Time   10/12/11  7:41 PM      Component Value Range Comment   Color, Urine YELLOW  YELLOW     APPearance HAZY (*) CLEAR     Specific Gravity, Urine 1.016  1.005 - 1.030     pH 5.0  5.0 - 8.0     Glucose, UA NEGATIVE  NEGATIVE (mg/dL)    Hgb urine dipstick LARGE (*) NEGATIVE     Bilirubin Urine SMALL (*) NEGATIVE     Ketones, ur NEGATIVE  NEGATIVE (mg/dL)    Protein, ur 30 (*) NEGATIVE (mg/dL)    Urobilinogen, UA 1.0  0.0 -  1.0 (mg/dL)    Nitrite NEGATIVE  NEGATIVE     Leukocytes, UA LARGE (*) NEGATIVE    POCT I-STAT TROPONIN I     Status: Normal   Collection Time   10/12/11  7:41 PM      Component Value Range Comment   Troponin i, poc 0.02  0.00 - 0.08 (ng/mL)    Comment 3            URINE MICROSCOPIC-ADD ON     Status: Abnormal   Collection Time   10/12/11  7:41 PM      Component Value Range Comment   Squamous Epithelial / LPF MANY (*) RARE     WBC, UA 21-50  <3  (WBC/hpf)    RBC / HPF 3-6  <3 (RBC/hpf)    Bacteria, UA FEW (*) RARE     Urine-Other MUCOUS PRESENT     PROTIME-INR     Status: Normal   Collection Time   10/12/11  8:51 PM      Component Value Range Comment   Prothrombin Time 15.0  11.6 - 15.2 (seconds)    INR 1.16  0.00 - 1.49     No results found.  Review of Systems  Constitutional: Positive for fever.  HENT: Negative.   Eyes: Negative.   Respiratory: Negative.   Cardiovascular: Negative.   Gastrointestinal: Negative.   Musculoskeletal:       Pain and swelling in the right thigh.  Skin: Negative.   Neurological: Negative.   Endo/Heme/Allergies: Negative.   Psychiatric/Behavioral: Negative.     Blood pressure 119/75, pulse 112, temperature 98 F (36.7 C), temperature source Oral, resp. rate 20, SpO2 96.00%. Physical Exam  Constitutional: He is oriented to person, place, and time. He appears well-developed.       Obese.  HENT:  Head: Normocephalic and atraumatic.  Right Ear: External ear normal.  Left Ear: External ear normal.  Nose: Nose normal.  Mouth/Throat: Oropharynx is clear and moist. No oropharyngeal exudate.  Eyes: Conjunctivae and EOM are normal. Pupils are equal, round, and reactive to light. Right eye exhibits no discharge. Left eye exhibits no discharge. No scleral icterus.       Poor vision both eyes.  Neck: Normal range of motion. Neck supple.  Cardiovascular:       Sinus tachycardia.  Respiratory: Effort normal and breath sounds normal.  Musculoskeletal:       Swelling in the right thigh medial aspect. With purulent bloody discharge.skin is indurated.  Neurological: He is alert and oriented to person, place, and time.       Moves upper and lower extremities. Poor vision.  Psychiatric: His behavior is normal.     Assessment/Plan #1. Cellulitis and abscess of his right thigh - patient will be placed on vancomycin and Zosyn and patient is going to the OR for surgical debridement for surgery. #2.  History of diabetes mellitus type 2 - will continue present medications and will be on sliding scale insulin. #3. History of atrial fibrillation presently in sinus rhythm and mild tachycardic - patient has not taken his cardiac medications for over 3 weeks. I have ordered one dose of metoprolol now and we will resume his regular home medications. Is tachycardia is also found the discomfort and pain for which we have ordered pain relief medications. We will closely observe him in telemetry.He was not taking coumadin for over a year. We will resume coumadin after surgery once ok with surgery. #4. History of OSA - patient  does not uses CPAP machine. He states he feels fine without it. Presently he is not in distress. #5. History of CHF - presently denies any shortness of breath. We resume his home medications.  CODE STATUS - full code.  Eduard Clos. 10/12/2011, 11:39 PM

## 2011-10-12 NOTE — ED Provider Notes (Signed)
History     CSN: [id number]  Arrival date & time 10/12/11  1651   First MD Initiated Contact with Patient 10/12/11 1843     HPI Patient reports 2 or 3 weeks ago started to develop an abscess on his right superior medial thigh. States he continued to await to see that would improve. States it continues to grow and now is draining a purulent malodorous material. States he went to his primary care physician Dr. Bosie Clos he recommended he come to the ED for possible surgical consult.  Patient is a 41 y.o. male presenting with abscess. The history is provided by the patient.  Abscess  This is a new problem. Episode onset: 2 or 3 weeks ago. The onset was gradual. The problem occurs continuously. The problem has been gradually worsening. The abscess is present on the right upper leg. The problem is severe. The abscess is characterized by painfulness, redness, draining and swelling. Pertinent negatives include no fever and no vomiting.    Past Medical History  Diagnosis Date  . CHF (congestive heart failure)   . HTN (hypertension)   . Ptosis   . Atrial flutter   . Atrial fibrillation   . Morbid obesity   . DM2 (diabetes mellitus, type 2)   . Depression   . Respiratory failure   . Chronic low back pain     Past Surgical History  Procedure Date  . Tonsillectomy     Family History  Problem Relation Age of Onset  . Coronary artery disease Mother     20s; father had it in 46s; both deceased in their 12s      History  Substance Use Topics  . Smoking status: Unknown If Ever Smoked  . Smokeless tobacco: Not on file   Comment: no significant tobacco use   . Alcohol Use: No     no significant use      Review of Systems  Constitutional: Negative for fever and chills.  Respiratory: Negative for shortness of breath.   Gastrointestinal: Negative for vomiting.  Skin: Positive for color change and wound.       Abscess  All other systems reviewed and are negative.    Allergies    Iodine  Home Medications   Current Outpatient Rx  Name Route Sig Dispense Refill  . ALBUTEROL SULFATE HFA 108 (90 BASE) MCG/ACT IN AERS Inhalation Inhale 2 puffs into the lungs every 6 (six) hours as needed. Shortness of breath    . AMIODARONE HCL 200 MG PO TABS Oral Take 200 mg by mouth daily.    Marland Kitchen DIGOXIN 0.25 MG PO TABS Oral Take 250 mcg by mouth daily.    Marland Kitchen DIVALPROEX SODIUM 500 MG PO TBEC Oral Take 1,000 mg by mouth daily.    . FUROSEMIDE 40 MG PO TABS Oral Take 40 mg by mouth 2 (two) times daily.     . IBUPROFEN 200 MG PO TABS Oral Take 200 mg by mouth every 6 (six) hours as needed. For pain    . INSULIN ASPART PROT & ASPART (70-30) 100 UNIT/ML [place] SUSP Subcutaneous Inject 70-90 Units into the skin 2 (two) times daily with a meal. 90 units in the morning; 70 units in the evening    . LISINOPRIL 20 MG PO TABS Oral Take 20 mg by mouth daily.      Marland Kitchen METOPROLOL SUCCINATE ER 50 MG PO TB24 Oral Take 50 mg by mouth daily.    . OXYCODONE HCL 15 MG PO TABS  Oral Take 15 mg by mouth every 6 (six) hours. For pain    . PRAVASTATIN SODIUM 20 MG PO TABS Oral Take 40 mg by mouth at bedtime.     . WARFARIN SODIUM 10 MG PO TABS Oral Take 10-15 mg by mouth daily. Monday, Wednesday, Friday 15mg ; Sunday, Tuesday, Thursday, Saturday 10mg       BP 108/66  Pulse 126  Temp(Src) 98 F (36.7 C) (Oral)  Resp 32  SpO2 93%  Physical Exam  Constitutional: He is oriented to person, place, and time. He appears well-developed and well-nourished.  HENT:  Head: Normocephalic and atraumatic.  Eyes: Pupils are equal, round, and reactive to light.  Neurological: He is alert and oriented to person, place, and time.  Skin: Skin is warm and dry. No rash noted. No erythema. No pallor.       Patient is a morbidly obese male with a large indurated area on medial superior thigh. Draining a serous sanguinous fluid. Any of infection is approximately 8 x 6inches, the size cantaloupe. Tender to palpation. Localized erythema.  Potentially a large abscess.  Psychiatric: He has a normal mood and affect. His behavior is normal.    ED Course  Procedures   Results for orders placed during the hospital encounter of 10/12/11  CBC      Component Value Range   WBC 15.7 (*) 4.0 - 10.5 (K/uL)   RBC 5.43  4.22 - 5.81 (MIL/uL)   Hemoglobin 15.0  13.0 - 17.0 (g/dL)   HCT 40.9  81.1 - 91.4 (%)   MCV 79.7  78.0 - 100.0 (fL)   MCH 27.6  26.0 - 34.0 (pg)   MCHC 34.6  30.0 - 36.0 (g/dL)   RDW 78.2  95.6 - 21.3 (%)   Platelets 232  150 - 400 (K/uL)  DIFFERENTIAL      Component Value Range   Neutrophils Relative 73  43 - 77 (%)   Lymphocytes Relative 18  12 - 46 (%)   Monocytes Relative 8  3 - 12 (%)   Eosinophils Relative 1  0 - 5 (%)   Basophils Relative 0  0 - 1 (%)   Neutro Abs 11.4 (*) 1.7 - 7.7 (K/uL)   Lymphs Abs 2.8  0.7 - 4.0 (K/uL)   Monocytes Absolute 1.3 (*) 0.1 - 1.0 (K/uL)   Eosinophils Absolute 0.2  0.0 - 0.7 (K/uL)   Basophils Absolute 0.0  0.0 - 0.1 (K/uL)   Smear Review MORPHOLOGY UNREMARKABLE    BASIC METABOLIC PANEL      Component Value Range   Sodium 136  135 - 145 (mEq/L)   Potassium 3.6  3.5 - 5.1 (mEq/L)   Chloride 101  96 - 112 (mEq/L)   CO2 23  19 - 32 (mEq/L)   Glucose, Bld 234 (*) 70 - 99 (mg/dL)   BUN 22  6 - 23 (mg/dL)   Creatinine, Ser 0.86  0.50 - 1.35 (mg/dL)   Calcium 9.3  8.4 - 57.8 (mg/dL)   GFR calc non Af Amer 89 (*) >90 (mL/min)   GFR calc Af Amer >90  >90 (mL/min)  URINALYSIS, ROUTINE W REFLEX MICROSCOPIC      Component Value Range   Color, Urine YELLOW  YELLOW    APPearance HAZY (*) CLEAR    Specific Gravity, Urine 1.016  1.005 - 1.030    pH 5.0  5.0 - 8.0    Glucose, UA NEGATIVE  NEGATIVE (mg/dL)   Hgb urine dipstick LARGE (*)  NEGATIVE    Bilirubin Urine SMALL (*) NEGATIVE    Ketones, ur NEGATIVE  NEGATIVE (mg/dL)   Protein, ur 30 (*) NEGATIVE (mg/dL)   Urobilinogen, UA 1.0  0.0 - 1.0 (mg/dL)   Nitrite NEGATIVE  NEGATIVE    Leukocytes, UA LARGE (*) NEGATIVE     POCT I-STAT TROPONIN I      Component Value Range   Troponin i, poc 0.02  0.00 - 0.08 (ng/mL)   Comment 3           URINE MICROSCOPIC-ADD ON      Component Value Range   Squamous Epithelial / LPF MANY (*) RARE    WBC, UA 21-50  <3 (WBC/hpf)   RBC / HPF 3-6  <3 (RBC/hpf)   Bacteria, UA FEW (*) RARE    Urine-Other MUCOUS PRESENT     No results found.   ED ECG REPORT   Date: 10/12/2011  EKG Time: 10:22 PM  Rate: 126  Rhythm: sinus tachycardia,  No significant changes since 07/25/2010   Axis: nml   Intervals:none  ST&T Change: Nonspecific T wave changes              MDM    Spoke with Dr. Luisa Hart. He'll come see the patient for consult.   10:34 PM Spoke with Dr. Toniann Fail. He will admit the patient her cellulitis, and IV antibiotics.  Abe People, PA-C 10/12/11 2234

## 2011-10-12 NOTE — ED Provider Notes (Signed)
Medical screening examination/treatment/procedure(s) were performed by non-physician practitioner and as supervising physician I was immediately available for consultation/collaboration.   Andalyn Heckstall L Laysha Childers, MD 10/12/11 2354 

## 2011-10-12 NOTE — ED Notes (Signed)
Two unsuccessful attempts at IV start.   IV team page to come  Start.

## 2011-10-12 NOTE — Consult Note (Signed)
Reason for Consult:cellulitis abscess right thigh Referring Physician: Zammmit  [name] is an 41 y.o. male.  HPI: The patient has a 2 week history of right thigh redness,  Swelling and now foul smelling drainage.  No fever or chills.  On coumadin.Pain is sharp in right thigh and severe.  Past Medical History  Diagnosis Date  . CHF (congestive heart failure)   . HTN (hypertension)   . Ptosis   . Atrial flutter   . Atrial fibrillation   . Morbid obesity   . DM2 (diabetes mellitus, type 2)   . Depression   . Respiratory failure   . Chronic low back pain     Past Surgical History  Procedure Date  . Tonsillectomy     Family History  Problem Relation Age of Onset  . Coronary artery disease Mother     11s; father had it in 89s; both deceased in their 77s      Social History:  has an unknown smoking status. He does not have any smokeless tobacco history on file. He reports that he does not drink alcohol or use illicit drugs.  Allergies:  Allergies  Allergen Reactions  . Iodine     Medications: Prior to Admission:  (Not in a hospital admission) No current facility-administered medications on file prior to encounter.   Current Outpatient Prescriptions on File Prior to Encounter  Medication Sig Dispense Refill  . albuterol (VENTOLIN HFA) 108 (90 BASE) MCG/ACT inhaler Inhale 2 puffs into the lungs every 6 (six) hours as needed. Shortness of breath      . furosemide (LASIX) 40 MG tablet Take 40 mg by mouth 2 (two) times daily.       Marland Kitchen ibuprofen (ADVIL,MOTRIN) 200 MG tablet Take 200 mg by mouth every 6 (six) hours as needed. For pain      . lisinopril (PRINIVIL,ZESTRIL) 20 MG tablet Take 20 mg by mouth daily.        . pravastatin (PRAVACHOL) 20 MG tablet Take 40 mg by mouth at bedtime.         Results for orders placed during the hospital encounter of 10/12/11 (from the past 48 hour(s))  CBC     Status: Abnormal   Collection Time   10/12/11  7:23 PM      Component Value  Range Comment   WBC 15.7 (*) 4.0 - 10.5 (K/uL)    RBC 5.43  4.22 - 5.81 (MIL/uL)    Hemoglobin 15.0  13.0 - 17.0 (g/dL)    HCT 16.1  09.6 - 04.5 (%)    MCV 79.7  78.0 - 100.0 (fL)    MCH 27.6  26.0 - 34.0 (pg)    MCHC 34.6  30.0 - 36.0 (g/dL)    RDW 40.9  81.1 - 91.4 (%)    Platelets 232  150 - 400 (K/uL)   DIFFERENTIAL     Status: Abnormal   Collection Time   10/12/11  7:23 PM      Component Value Range Comment   Neutrophils Relative 73  43 - 77 (%)    Lymphocytes Relative 18  12 - 46 (%)    Monocytes Relative 8  3 - 12 (%)    Eosinophils Relative 1  0 - 5 (%)    Basophils Relative 0  0 - 1 (%)    Neutro Abs 11.4 (*) 1.7 - 7.7 (K/uL)    Lymphs Abs 2.8  0.7 - 4.0 (K/uL)    Monocytes Absolute 1.3 (*)  0.1 - 1.0 (K/uL)    Eosinophils Absolute 0.2  0.0 - 0.7 (K/uL)    Basophils Absolute 0.0  0.0 - 0.1 (K/uL)    Smear Review MORPHOLOGY UNREMARKABLE     BASIC METABOLIC PANEL     Status: Abnormal   Collection Time   10/12/11  7:23 PM      Component Value Range Comment   Sodium 136  135 - 145 (mEq/L)    Potassium 3.6  3.5 - 5.1 (mEq/L)    Chloride 101  96 - 112 (mEq/L)    CO2 23  19 - 32 (mEq/L)    Glucose, Bld 234 (*) 70 - 99 (mg/dL)    BUN 22  6 - 23 (mg/dL)    Creatinine, Ser 1.61  0.50 - 1.35 (mg/dL)    Calcium 9.3  8.4 - 10.5 (mg/dL)    GFR calc non Af Amer 89 (*) >90 (mL/min)    GFR calc Af Amer >90  >90 (mL/min)   URINALYSIS, ROUTINE W REFLEX MICROSCOPIC     Status: Abnormal   Collection Time   10/12/11  7:41 PM      Component Value Range Comment   Color, Urine YELLOW  YELLOW     APPearance HAZY (*) CLEAR     Specific Gravity, Urine 1.016  1.005 - 1.030     pH 5.0  5.0 - 8.0     Glucose, UA NEGATIVE  NEGATIVE (mg/dL)    Hgb urine dipstick LARGE (*) NEGATIVE     Bilirubin Urine SMALL (*) NEGATIVE     Ketones, ur NEGATIVE  NEGATIVE (mg/dL)    Protein, ur 30 (*) NEGATIVE (mg/dL)    Urobilinogen, UA 1.0  0.0 - 1.0 (mg/dL)    Nitrite NEGATIVE  NEGATIVE     Leukocytes, UA  LARGE (*) NEGATIVE    POCT I-STAT TROPONIN I     Status: Normal   Collection Time   10/12/11  7:41 PM      Component Value Range Comment   Troponin i, poc 0.02  0.00 - 0.08 (ng/mL)    Comment 3            URINE MICROSCOPIC-ADD ON     Status: Abnormal   Collection Time   10/12/11  7:41 PM      Component Value Range Comment   Squamous Epithelial / LPF MANY (*) RARE     WBC, UA 21-50  <3 (WBC/hpf)    RBC / HPF 3-6  <3 (RBC/hpf)    Bacteria, UA FEW (*) RARE     Urine-Other MUCOUS PRESENT       No results found.  Review of Systems  Constitutional: Positive for malaise/fatigue. Negative for fever, chills and weight loss.  HENT: Negative.   Eyes: Negative.   Respiratory: Positive for shortness of breath.   Cardiovascular: Positive for leg swelling and PND.  Gastrointestinal: Negative.   Genitourinary: Negative.   Musculoskeletal: Positive for myalgias.  Skin: Negative.   Neurological: Negative.  Negative for weakness.  Endo/Heme/Allergies: Negative.   Psychiatric/Behavioral: Negative.    Blood pressure 108/66, pulse 126, temperature 98 F (36.7 C), temperature source Oral, resp. rate 32, SpO2 93.00%. Physical Exam  Constitutional: He is oriented to person, place, and time. He appears well-developed and well-nourished.       Morbidily obese  HENT:  Head: Normocephalic and atraumatic.  Eyes: EOM are normal. Pupils are equal, round, and reactive to light.  Neck: Normal range of motion. Neck supple.  Genitourinary:  Penis normal. No penile tenderness.  Musculoskeletal: Normal range of motion.  Neurological: He is alert and oriented to person, place, and time.  Skin: There is erythema.       Assessment/Plan: Cellulitis/abscess right thigh MORBID OBESITY BMI greater than 45 Coumadin therapy Will need to be NPO after midnight.  Medicine to admit.  OR maybe Friday for debridement. OK to drink and eat prior to midnight.   [name] A. 10/12/2011, 8:35 PM

## 2011-10-12 NOTE — ED Notes (Signed)
Pt states that for the past 2-3 weeks he has been having an abscess on the right groin that is very large in nature. It is so large that eagle determined that it may be grapefruit size or larger. It has now started to spread to the left thigh and is now open, smelly, and draining pus and blood. The office at [facility] was going to try to drain it but due to size and location they sent him in for eval of edp for possible surgical consult. Pt states that it is very painful. He denies any severe swelling which includes his genitals.

## 2011-10-13 ENCOUNTER — Encounter (HOSPITAL_COMMUNITY): Payer: Self-pay | Admitting: Anesthesiology

## 2011-10-13 ENCOUNTER — Other Ambulatory Visit (INDEPENDENT_AMBULATORY_CARE_PROVIDER_SITE_OTHER): Payer: Self-pay | Admitting: General Surgery

## 2011-10-13 ENCOUNTER — Encounter (HOSPITAL_COMMUNITY)
Admission: EM | Disposition: A | Discharge status: Home / Self Care | Payer: Self-pay | Source: Home / Self Care | Attending: Internal Medicine

## 2011-10-13 ENCOUNTER — Encounter (HOSPITAL_COMMUNITY): Payer: Self-pay | Admitting: *Deleted

## 2011-10-13 ENCOUNTER — Inpatient Hospital Stay (HOSPITAL_COMMUNITY): Payer: 59 | Admitting: Anesthesiology

## 2011-10-13 HISTORY — PX: IRRIGATION AND DEBRIDEMENT ABSCESS: SHX5252

## 2011-10-13 LAB — GLUCOSE, CAPILLARY
Glucose-Capillary: 221 mg/dL — ABNORMAL HIGH (ref 70–99)
Glucose-Capillary: 196 mg/dL — ABNORMAL HIGH (ref 70–99)
Glucose-Capillary: 245 mg/dL — ABNORMAL HIGH (ref 70–99)
Glucose-Capillary: 274 mg/dL — ABNORMAL HIGH (ref 70–99)

## 2011-10-13 LAB — COMPREHENSIVE METABOLIC PANEL
Alkaline Phosphatase: 83 U/L (ref 39–117)
BUN: 20 mg/dL (ref 6–23)
Chloride: 104 mEq/L (ref 96–112)
Creatinine, Ser: 1.01 mg/dL (ref 0.50–1.35)
GFR calc Af Amer: >90 mL/min (ref >90)
GFR calc non Af Amer: >90 mL/min (ref >90)
Glucose, Bld: 206 mg/dL — ABNORMAL HIGH (ref 70–99)
Potassium: 3.7 mEq/L (ref 3.5–5.1)
Total Bilirubin: 0.6 mg/dL (ref 0.3–1.2)

## 2011-10-13 LAB — CBC
Platelets: 221 10*3/uL (ref 150–400)
RDW: 14.1 % (ref 11.5–15.5)
WBC: 14 10*3/uL — ABNORMAL HIGH (ref 4.0–10.5)

## 2011-10-13 LAB — ANAEROBIC CULTURE

## 2011-10-13 LAB — TSH: TSH: 1.488 u[IU]/mL (ref 0.350–4.500)

## 2011-10-13 LAB — CULTURE, ROUTINE-ABSCESS

## 2011-10-13 SURGERY — IRRIGATION AND DEBRIDEMENT ABSCESS
Anesthesia: General | Site: Thigh | Laterality: Right | Wound class: Dirty or Infected

## 2011-10-13 MED ORDER — ONDANSETRON HCL 4 MG/2ML IJ SOLN: 4.0000 mg | Freq: Four times a day (QID) | INTRAMUSCULAR | Status: DC | PRN

## 2011-10-13 MED ORDER — LACTATED RINGERS IV SOLN
INTRAVENOUS | Status: DC | PRN
  Administered 2011-10-13: 11:00:00 via INTRAVENOUS

## 2011-10-13 MED ORDER — ALBUTEROL SULFATE HFA 108 (90 BASE) MCG/ACT IN AERS: 2.0000 | INHALATION_SPRAY | Freq: Four times a day (QID) | RESPIRATORY_TRACT | Status: DC | PRN

## 2011-10-13 MED ORDER — SODIUM CHLORIDE 0.9 % IJ SOLN
3.0000 mL | Freq: Two times a day (BID) | INTRAMUSCULAR | Status: DC
  Administered 2011-10-13 – 2011-10-18 (×7): 3 mL via INTRAVENOUS

## 2011-10-13 MED ORDER — SUCCINYLCHOLINE CHLORIDE 20 MG/ML IJ SOLN
INTRAMUSCULAR | Status: DC | PRN
  Administered 2011-10-13: 200 mg via INTRAVENOUS

## 2011-10-13 MED ORDER — LISINOPRIL 20 MG PO TABS
20.0000 mg | ORAL_TABLET | Freq: Every day | ORAL | Status: DC
  Administered 2011-10-13 – 2011-10-14 (×2): 20 mg via ORAL
  Filled 2011-10-13 (×3): qty 1

## 2011-10-13 MED ORDER — VANCOMYCIN HCL 1000 MG IV SOLR
2500.0000 mg | Freq: Once | INTRAVENOUS | Status: AC
  Administered 2011-10-13: 2500 mg via INTRAVENOUS
  Filled 2011-10-13: qty 2500

## 2011-10-13 MED ORDER — PROMETHAZINE HCL 25 MG/ML IJ SOLN: 6.2500 mg | INTRAMUSCULAR | Status: DC | PRN

## 2011-10-13 MED ORDER — OXYCODONE HCL 5 MG PO TABS
15.0000 mg | ORAL_TABLET | Freq: Four times a day (QID) | ORAL | Status: DC
  Administered 2011-10-13 – 2011-10-19 (×20): 15 mg via ORAL
  Filled 2011-10-13 (×9): qty 3
  Filled 2011-10-13: qty 2
  Filled 2011-10-13 (×5): qty 3
  Filled 2011-10-13: qty 1
  Filled 2011-10-13 (×4): qty 3
  Filled 2011-10-13 (×2): qty 1

## 2011-10-13 MED ORDER — INSULIN ASPART 100 UNIT/ML ~~LOC~~ SOLN
0.0000 [IU] | Freq: Three times a day (TID) | SUBCUTANEOUS | Status: DC
  Administered 2011-10-13: 2 [IU] via SUBCUTANEOUS
  Administered 2011-10-13: 7 [IU] via SUBCUTANEOUS
  Administered 2011-10-14 (×3): 9 [IU] via SUBCUTANEOUS
  Administered 2011-10-15 (×2): 5 [IU] via SUBCUTANEOUS
  Administered 2011-10-15: 9 [IU] via SUBCUTANEOUS
  Administered 2011-10-16 (×2): 2 [IU] via SUBCUTANEOUS
  Administered 2011-10-16: 5 [IU] via SUBCUTANEOUS
  Administered 2011-10-17: 1 [IU] via SUBCUTANEOUS
  Administered 2011-10-17 – 2011-10-18 (×3): 2 [IU] via SUBCUTANEOUS
  Administered 2011-10-18: 1 [IU] via SUBCUTANEOUS
  Filled 2011-10-13: qty 3

## 2011-10-13 MED ORDER — LACTATED RINGERS IV SOLN
INTRAVENOUS | Status: DC
  Administered 2011-10-13: 18:00:00 via INTRAVENOUS

## 2011-10-13 MED ORDER — AMIODARONE HCL 200 MG PO TABS
200.0000 mg | ORAL_TABLET | Freq: Every day | ORAL | Status: DC
  Administered 2011-10-13 – 2011-10-19 (×7): 200 mg via ORAL
  Filled 2011-10-13 (×7): qty 1

## 2011-10-13 MED ORDER — FUROSEMIDE 40 MG PO TABS
40.0000 mg | ORAL_TABLET | Freq: Two times a day (BID) | ORAL | Status: DC
  Administered 2011-10-13 – 2011-10-14 (×4): 40 mg via ORAL
  Filled 2011-10-13 (×6): qty 1

## 2011-10-13 MED ORDER — ACETAMINOPHEN 650 MG RE SUPP: 650.0000 mg | Freq: Four times a day (QID) | RECTAL | Status: DC | PRN

## 2011-10-13 MED ORDER — ACETAMINOPHEN 325 MG PO TABS: 650.0000 mg | ORAL_TABLET | Freq: Four times a day (QID) | ORAL | Status: DC | PRN

## 2011-10-13 MED ORDER — METOPROLOL SUCCINATE ER 50 MG PO TB24
50.0000 mg | ORAL_TABLET | Freq: Every day | ORAL | Status: DC
  Administered 2011-10-13 – 2011-10-14 (×3): 50 mg via ORAL
  Filled 2011-10-13 (×3): qty 1

## 2011-10-13 MED ORDER — VANCOMYCIN HCL 1000 MG IV SOLR
2000.0000 mg | Freq: Three times a day (TID) | INTRAVENOUS | Status: DC
  Administered 2011-10-13 – 2011-10-14 (×3): 2000 mg via INTRAVENOUS
  Filled 2011-10-13 (×5): qty 2000

## 2011-10-13 MED ORDER — FENTANYL CITRATE 0.05 MG/ML IJ SOLN
INTRAMUSCULAR | Status: DC | PRN
  Administered 2011-10-13: 100 ug via INTRAVENOUS

## 2011-10-13 MED ORDER — ONDANSETRON HCL 4 MG PO TABS: 4.0000 mg | ORAL_TABLET | Freq: Four times a day (QID) | ORAL | Status: DC | PRN

## 2011-10-13 MED ORDER — VANCOMYCIN HCL 1000 MG IV SOLR
2000.0000 mg | Freq: Two times a day (BID) | INTRAVENOUS | Status: DC
  Filled 2011-10-13: qty 2000

## 2011-10-13 MED ORDER — FENTANYL CITRATE 0.05 MG/ML IJ SOLN: 25.0000 ug | INTRAMUSCULAR | Status: DC | PRN

## 2011-10-13 MED ORDER — HYDROMORPHONE HCL PF 1 MG/ML IJ SOLN
0.5000 mg | INTRAMUSCULAR | Status: DC | PRN
  Administered 2011-10-13: 15:00:00 via INTRAVENOUS
  Administered 2011-10-13 – 2011-10-18 (×4): 0.5 mg via INTRAVENOUS
  Filled 2011-10-13 (×5): qty 1

## 2011-10-13 MED ORDER — PIPERACILLIN-TAZOBACTAM 3.375 G IVPB
3.3750 g | Freq: Three times a day (TID) | INTRAVENOUS | Status: DC
  Administered 2011-10-13: 3.375 g via INTRAVENOUS
  Filled 2011-10-13 (×3): qty 50

## 2011-10-13 MED ORDER — INSULIN ASPART PROT & ASPART (70-30 MIX) 100 UNIT/ML ~~LOC~~ SUSP
90.0000 [IU] | Freq: Every day | SUBCUTANEOUS | Status: DC
  Administered 2011-10-14: 90 [IU] via SUBCUTANEOUS
  Filled 2011-10-13: qty 3

## 2011-10-13 MED ORDER — 0.9 % SODIUM CHLORIDE (POUR BTL) OPTIME
TOPICAL | Status: DC | PRN
  Administered 2011-10-13: 1000 mL

## 2011-10-13 MED ORDER — PROPOFOL 10 MG/ML IV EMUL
INTRAVENOUS | Status: DC | PRN
  Administered 2011-10-13: 300 mg via INTRAVENOUS

## 2011-10-13 MED ORDER — PHENYLEPHRINE HCL 10 MG/ML IJ SOLN
INTRAMUSCULAR | Status: DC | PRN
  Administered 2011-10-13: 80 ug via INTRAVENOUS
  Administered 2011-10-13: 40 ug via INTRAVENOUS
  Administered 2011-10-13 (×2): 80 ug via INTRAVENOUS
  Administered 2011-10-13: 40 ug via INTRAVENOUS
  Administered 2011-10-13: 80 ug via INTRAVENOUS

## 2011-10-13 MED ORDER — INSULIN ASPART PROT & ASPART (70-30 MIX) 100 UNIT/ML ~~LOC~~ SUSP
70.0000 [IU] | Freq: Every day | SUBCUTANEOUS | Status: DC
  Administered 2011-10-13: 70 [IU] via SUBCUTANEOUS

## 2011-10-13 MED ORDER — LACTATED RINGERS IV SOLN
INTRAVENOUS | Status: DC
  Administered 2011-10-13: 11:00:00 via INTRAVENOUS

## 2011-10-13 MED ORDER — DIGOXIN 250 MCG PO TABS
250.0000 ug | ORAL_TABLET | Freq: Every day | ORAL | Status: DC
  Administered 2011-10-13 – 2011-10-14 (×2): 250 ug via ORAL
  Filled 2011-10-13 (×3): qty 1

## 2011-10-13 MED ORDER — MIDAZOLAM HCL 5 MG/5ML IJ SOLN
INTRAMUSCULAR | Status: DC | PRN
  Administered 2011-10-13: 2 mg via INTRAVENOUS

## 2011-10-13 MED ORDER — DIVALPROEX SODIUM 500 MG PO DR TAB
1000.0000 mg | DELAYED_RELEASE_TABLET | Freq: Every day | ORAL | Status: DC
  Administered 2011-10-13 – 2011-10-19 (×7): 1000 mg via ORAL
  Filled 2011-10-13 (×7): qty 2

## 2011-10-13 MED ORDER — INFLUENZA VIRUS VACC SPLIT PF IM SUSP
0.5000 mL | INTRAMUSCULAR | Status: AC
  Administered 2011-10-14: 0.5 mL via INTRAMUSCULAR
  Filled 2011-10-13: qty 0.5

## 2011-10-13 MED ORDER — INSULIN ASPART 100 UNIT/ML ~~LOC~~ SOLN
6.0000 [IU] | Freq: Once | SUBCUTANEOUS | Status: AC
  Administered 2011-10-13: 6 [IU] via SUBCUTANEOUS

## 2011-10-13 MED ORDER — INSULIN ASPART PROT & ASPART (70-30 MIX) 100 UNIT/ML ~~LOC~~ SUSP: 70.0000 [IU] | Freq: Two times a day (BID) | SUBCUTANEOUS | Status: DC

## 2011-10-13 MED ORDER — ONDANSETRON HCL 4 MG/2ML IJ SOLN
INTRAMUSCULAR | Status: DC | PRN
  Administered 2011-10-13: 4 mg via INTRAVENOUS

## 2011-10-13 MED ORDER — SIMVASTATIN 10 MG PO TABS
10.0000 mg | ORAL_TABLET | Freq: Every day | ORAL | Status: DC
  Administered 2011-10-13 – 2011-10-18 (×6): 10 mg via ORAL
  Filled 2011-10-13 (×7): qty 1

## 2011-10-13 SURGICAL SUPPLY — 49 items
BANDAGE GAUZE ELAST BULKY 4 IN (GAUZE/BANDAGES/DRESSINGS) ×2 IMPLANT
BLADE SURG 10 STRL SS (BLADE) ×2 IMPLANT
BLADE SURG 15 STRL LF DISP TIS (BLADE) ×1 IMPLANT
BLADE SURG 15 STRL SS (BLADE) ×1
CANISTER SUCTION 2500CC (MISCELLANEOUS) ×2 IMPLANT
CHLORAPREP W/TINT 26ML (MISCELLANEOUS) IMPLANT
CLEANER TIP ELECTROSURG 2X2 (MISCELLANEOUS) ×2 IMPLANT
CLOTH BEACON ORANGE TIMEOUT ST (SAFETY) ×2 IMPLANT
COVER SURGICAL LIGHT HANDLE (MISCELLANEOUS) ×2 IMPLANT
DECANTER SPIKE VIAL GLASS SM (MISCELLANEOUS) IMPLANT
DRAPE EXTREMITY T 121X128X90 (DRAPE) ×2 IMPLANT
DRAPE LAPAROSCOPIC ABDOMINAL (DRAPES) ×2 IMPLANT
DRSG PAD ABDOMINAL 8X10 ST (GAUZE/BANDAGES/DRESSINGS) ×2 IMPLANT
ELECT REM PT RETURN 9FT ADLT (ELECTROSURGICAL) ×2
ELECTRODE REM PT RTRN 9FT ADLT (ELECTROSURGICAL) ×1 IMPLANT
GAUZE SPONGE 4X4 16PLY XRAY LF (GAUZE/BANDAGES/DRESSINGS) IMPLANT
GLOVE BIO SURGEON STRL SZ7 (GLOVE) ×4 IMPLANT
GLOVE BIO SURGEON STRL SZ7.5 (GLOVE) ×4 IMPLANT
GLOVE BIOGEL PI IND STRL 7.0 (GLOVE) ×1 IMPLANT
GLOVE BIOGEL PI IND STRL 7.5 (GLOVE) ×4 IMPLANT
GLOVE BIOGEL PI INDICATOR 7.0 (GLOVE) ×1
GLOVE BIOGEL PI INDICATOR 7.5 (GLOVE) ×4
GLOVE SURG SS PI 7.0 STRL IVOR (GLOVE) ×2 IMPLANT
GOWN STRL NON-REIN LRG LVL3 (GOWN DISPOSABLE) ×10 IMPLANT
KIT BASIN OR (CUSTOM PROCEDURE TRAY) ×2 IMPLANT
KIT ROOM TURNOVER OR (KITS) ×2 IMPLANT
NEEDLE HYPO 25X1 1.5 SAFETY (NEEDLE) IMPLANT
NS IRRIG 1000ML POUR BTL (IV SOLUTION) ×4 IMPLANT
PACK SURGICAL SETUP 50X90 (CUSTOM PROCEDURE TRAY) ×2 IMPLANT
PAD ARMBOARD 7.5X6 YLW CONV (MISCELLANEOUS) ×4 IMPLANT
PENCIL BUTTON HOLSTER BLD 10FT (ELECTRODE) ×2 IMPLANT
SPECIMEN JAR MEDIUM (MISCELLANEOUS) ×2 IMPLANT
SPECIMEN JAR SMALL (MISCELLANEOUS) IMPLANT
SPONGE GAUZE 4X4 12PLY (GAUZE/BANDAGES/DRESSINGS) ×2 IMPLANT
SPONGE LAP 18X18 X RAY DECT (DISPOSABLE) ×2 IMPLANT
SUT MNCRL AB 4-0 PS2 18 (SUTURE) IMPLANT
SUT VIC AB 2-0 SH 27 (SUTURE) ×1
SUT VIC AB 2-0 SH 27X BRD (SUTURE) ×1 IMPLANT
SUT VIC AB 3-0 SH 27 (SUTURE)
SUT VIC AB 3-0 SH 27XBRD (SUTURE) IMPLANT
SWAB COLLECTION DEVICE MRSA (MISCELLANEOUS) ×2 IMPLANT
SYR BULB 3OZ (MISCELLANEOUS) ×2 IMPLANT
SYR CONTROL 10ML LL (SYRINGE) IMPLANT
TOWEL OR 17X24 6PK STRL BLUE (TOWEL DISPOSABLE) ×2 IMPLANT
TOWEL OR 17X26 10 PK STRL BLUE (TOWEL DISPOSABLE) ×2 IMPLANT
TUBE ANAEROBIC SPECIMEN COL (MISCELLANEOUS) ×2 IMPLANT
TUBE CONNECTING 12X1/4 (SUCTIONS) ×2 IMPLANT
WATER STERILE IRR 1000ML POUR (IV SOLUTION) IMPLANT
YANKAUER SUCT BULB TIP NO VENT (SUCTIONS) ×2 IMPLANT

## 2011-10-13 NOTE — Op Note (Signed)
Preoperative Dx: Right thigh abscess Postoperative Dx: SAA  Procedure: Right thigh abscess incision and drainage, debridement of 5x5 cm skin, subq fat Surgeon: Dr. Harden Mo Anes: GETA EBL: minimal Comps: none Drains: none Specimen: culture to micro, tissue to pathology Sponge and needle count correct at completion To recovery room stable  Indications: 40yom morbidly obese presents with draining right thigh abscess.  Discussed incision and drainage in operating room.  Procedure: After informed consent obtained, taken to OR.  He was on antibiotics regimen on floor.  SCDs were placed prior to beginning operation.  He was then placed under GETA without complication.  He was prepped and draped in standard sterile surgical fashion.  Surgical timeout performed.  I made a large elliptical incision accompanying the draining sinus and necrotic skin.  I carried this down to a large pocket of purulence and debrided skin and subq fat.  Cultures obtained.  All dead tissue was removed.  Hemostasis obtained.  Saline soaked gauze placed.  Tolerated well.  To PACU stable.

## 2011-10-13 NOTE — Progress Notes (Signed)
Subjective:  41 year-old male with history of diabetes mellitus type 2, atrial fibrillation, hypertension, morbid obesity, OSA presents because of worsening swelling in his right thigh with pain and discharge. He noticed a small lesion 3 weeks ago on his medial aspect of his right thigh which gradually worsened in the last 48 hours was causing pain and discharge. He was admitted on 10/12/11 and underwent I and D in the OR on 10/13/11.      Physical Exam: Blood pressure 112/61, pulse 93, temperature 98.1 F (36.7 C), temperature source Oral, resp. rate 20, height 6\' 3"  (1.905 m), weight 212.9 kg (469 lb 5.8 oz), SpO2 90.00%. Alert and oriented x3 Chest clear to auscultation without wheeze rhonchi crackles Morbid obesity Heart regular rate and rhythm without murmur Right thigh with Ace bandage  Investigations:  Recent Results (from the past 240 hour(s))  CULTURE, ROUTINE-ABSCESS     Status: Normal (Preliminary result)   Collection Time   10/12/11  7:44 PM      Component Value Range Status Comment   Specimen Description ABSCESS THIGH RIGHT   Final    Special Requests NONE   Final    Gram Stain     Final    Value: RARE EBCP NO SQUAMOUS EPITHELIAL CELLS SEEN     RARE GRAM POSITIVE COCCI     IN PAIRS   Culture PENDING   Incomplete    Report Status PENDING   Incomplete      Basic Metabolic Panel:  Basename 10/13/11 0546 10/12/11 1923  NA 139 136  K 3.7 3.6  CL 104 101  CO2 24 23  GLUCOSE 206* 234*  BUN 20 22  CREATININE 1.01 1.03  CALCIUM 8.9 9.3  MG -- --  PHOS -- --   Liver Function Tests:  [facility] 10/13/11 0546  AST 11  ALT 5  ALKPHOS 83  BILITOT 0.6  PROT 7.7  ALBUMIN 3.0*     CBC:  Basename 10/13/11 0546 10/12/11 1923  WBC 14.0* 15.7*  NEUTROABS -- 11.4*  HGB 14.2 15.0  HCT 41.4 43.3  MCV 80.2 79.7  PLT 221 232    No results found.    Medications:  Scheduled:   . amiodarone  200 mg Oral Daily  . clindamycin (CLEOCIN) IV  600 mg Intravenous  Once  . digoxin  250 mcg Oral Daily  . divalproex  1,000 mg Oral Daily  . fentaNYL  50 mcg Intravenous Once  . furosemide  40 mg Oral BID  . influenza  inactive virus vaccine  0.5 mL Intramuscular Tomorrow-1000  . insulin aspart  0-9 Units Subcutaneous TID WC  . insulin aspart protamine-insulin aspart  70 Units Subcutaneous Q supper  . insulin aspart protamine-insulin aspart  90 Units Subcutaneous Q breakfast  . lisinopril  20 mg Oral Daily  . metoprolol  50 mg Oral Daily  . metoprolol succinate  50 mg Oral Once  . ondansetron  4 mg Intravenous Once  . oxyCODONE  15 mg Oral Q6H  . piperacillin-tazobactam (ZOSYN)  IV  3.375 g Intravenous Q8H  . simvastatin  10 mg Oral q1800  . sodium chloride  1,000 mL Intravenous Once  . sodium chloride  3 mL Intravenous Q12H  . vancomycin  2,000 mg Intravenous Q8H  . vancomycin  2,500 mg Intravenous Once  . DISCONTD: insulin aspart protamine-insulin aspart  70-90 Units Subcutaneous BID WC  . DISCONTD: vancomycin  2,000 mg Intravenous Q12H   Continuous:   Impression:  Principal  Problem:  *Cellulitis of thigh Active Problems:  DIABETES MELLITUS, TYPE II  Morbid obesity  OBSTRUCTIVE SLEEP APNEA  Atrial fibrillation  CHF     Plan:  Will continue current antibiotics until the final results of the cultures are available. Continue home medications     LOS: 1 day   Elowen Debruyn, MD Pager: [phone] 10/13/2011, 7:53 AM

## 2011-10-13 NOTE — Anesthesia Preprocedure Evaluation (Addendum)
Anesthesia Evaluation  Patient identified by MRN, date of birth, ID band Patient awake    Reviewed: Allergy & Precautions, H&P , NPO status , Patient's Chart, lab work & pertinent test results, reviewed documented beta blocker date and time   Airway Mallampati: I TM Distance: >3 FB Neck ROM: Full    Dental  (+) Poor Dentition and Dental Advisory Given   Pulmonary asthma , sleep apnea (prn O2), Continuous Positive Airway Pressure Ventilation and Oxygen sleep apnea ,  clear to auscultation  Pulmonary exam normal       Cardiovascular hypertension (took BP and heart meds today), Pt. on medications and Pt. on home beta blockers + dysrhythmias (ECHO '12- normal LVF, EF 55-60%,  INR1.16) Atrial Fibrillation Irregular Normal    Neuro/Psych PSYCHIATRIC DISORDERS Bipolar Disorder    GI/Hepatic Neg liver ROS, GERD-  Medicated and Controlled,  Endo/Other  Diabetes mellitus- (glu 196@ 07:20), Type 1, Insulin DependentMorbid obesity  Renal/GU negative Renal ROS     Musculoskeletal   Abdominal (+) obese,   Peds  Hematology   Anesthesia Other Findings   Reproductive/Obstetrics                          Anesthesia Physical Anesthesia Plan  ASA: III  Anesthesia Plan: General   Post-op Pain Management:    Induction: Intravenous  Airway Management Planned: Oral ETT  Additional Equipment:   Intra-op Plan:   Post-operative Plan: Extubation in OR  Informed Consent: I have reviewed the patients History and Physical, chart, labs and discussed the procedure including the risks, benefits and alternatives for the proposed anesthesia with the patient or authorized representative who has indicated his/her understanding and acceptance.   Dental advisory given  Plan Discussed with: CRNA, Surgeon and Anesthesiologist  Anesthesia Plan Comments: (Plan routine monitors, GETA)       Anesthesia Quick Evaluation

## 2011-10-13 NOTE — Progress Notes (Signed)
Inpatient Diabetes Program Recommendations  AACE/ADA: New Consensus Statement on Inpatient Glycemic Control (2009)  Target Ranges:  Prepandial:   less than 140 mg/dL      Peak postprandial:   less than 180 mg/dL (1-2 hours)      Critically ill patients:  140 - 180 mg/dL   Reason for Visit: Results for [name], [name] (MRN [id number]) as of 10/13/2011 10:40  Ref. Range 10/13/2011 01:15 10/13/2011 07:23  Glucose-Capillary Latest Range: 70-99 mg/dL 147 (H) 829 (H)    Inpatient Diabetes Program Recommendations Insulin - Basal: Consider holding 70/30 insulin while NPO.  Once diet resumed, consider only 1/2 of 70/30 insulin which would be 45 units 70/30 in the morning and 35 units 70/30  with supper. HgbA1C: Please order to assess prehospitalization glycemic control.  Note: Will follow.

## 2011-10-13 NOTE — Progress Notes (Signed)
   CARE MANAGEMENT NOTE 10/13/2011  Patient:  [name], [name]   Account Number:  [id number]  Date Initiated:  10/13/2011  Documentation initiated by:  Junius Creamer  Subjective/Objective Assessment:   adm w cellulitis of thigh     Action/Plan:   lives alone, pcp dr Clydie Braun schooler   Anticipated DC Date:  10/16/2011   Anticipated DC Plan:  HOME W HOME HEALTH SERVICES      DC Planning Services  CM consult      Choice offered to / List presented to:             Status of service:   Medicare Important Message given?   (If response is "NO", the following Medicare IM given date fields will be blank) Date Medicare IM given:   Date Additional Medicare IM given:    Discharge Disposition:    Per UR Regulation:  Reviewed for med. necessity/level of care/duration of stay  Comments:  1/11 ur ins Reuben Likes Kedrick Mcnamee rn,bsn [phone]

## 2011-10-13 NOTE — Preoperative (Signed)
Beta Blockers   Reason not to administer Beta Blockers:Not Applicable 

## 2011-10-13 NOTE — Transfer of Care (Signed)
Immediate Anesthesia Transfer of Care Note  Patient: [name]  Procedure(s) Performed:  IRRIGATION AND DEBRIDEMENT ABSCESS - Irrigation and debridement right thigh abscess  Patient Location: PACU  Anesthesia Type: General  Level of Consciousness: awake, alert  and oriented  Airway & Oxygen Therapy: Patient Spontanous Breathing and Patient connected to face mask oxygen  Post-op Assessment: Report given to PACU RN and Post -op Vital signs reviewed and stable  Post vital signs: Reviewed and stable Filed Vitals:   10/13/11 1245  BP:   Pulse:   Temp: 36.4 C  Resp:     Complications: No apparent anesthesia complications

## 2011-10-13 NOTE — Progress Notes (Signed)
PHARMACY - ANTIBIOTIC CONSULT  INITIAL NOTE  Pharmacy Consult for: Vancomycin , Zosyn   Indication: Cellulitis and abscess of right thigh   Patient Data:   Allergies: Allergies  Allergen Reactions  . Shellfish Allergy Anaphylaxis  . Iodine     Patient Measurements: Height: 6\' 3"  (190.5 cm) IBW/kg (Calculated) : 84.5    Vital Signs: Temp:  [98 F (36.7 C)-99.5 F (37.5 C)] 98.2 F (36.8 C) (01/11 0108) Pulse Rate:  [107-126] 108  (01/11 0108) Resp:  [15-32] 24  (01/11 0108) BP: (88-147)/(59-86) 125/70 mmHg (01/11 0108) SpO2:  [91 %-96 %] 92 % (01/11 0108)  Intake/Output from previous day: No intake or output data in the 24 hours ending 10/13/11 0115  Labs:  [facility] 10/12/11 1923  WBC 15.7*  HGB 15.0  PLT 232  LABCREA --  CREATININE 1.03   The CrCl is unknown because both a height and weight (above a minimum accepted value) are required for this calculation. No results found for this basename: VANCOTROUGH:2,VANCOPEAK:2,VANCORANDOM:2,GENTTROUGH:2,GENTPEAK:2,GENTRANDOM:2,TOBRATROUGH:2,TOBRAPEAK:2,TOBRARND:2,AMIKACINPEAK:2,AMIKACINTROU:2,AMIKACIN:2, in the last 72 hours   Microbiology: No results found for this or any previous visit (from the past 720 hour(s)).  Medical History: Past Medical History  Diagnosis Date  . CHF (congestive heart failure)   . HTN (hypertension)   . Ptosis   . Atrial flutter   . Atrial fibrillation   . Morbid obesity   . DM2 (diabetes mellitus, type 2)   . Depression   . Respiratory failure   . Chronic low back pain   . Diabetes mellitus   . Asthma   . Vision abnormalities     Scheduled Medications:     . amiodarone  200 mg Oral Daily  . clindamycin (CLEOCIN) IV  600 mg Intravenous Once  . digoxin  250 mcg Oral Daily  . divalproex  1,000 mg Oral Daily  . fentaNYL  50 mcg Intravenous Once  . furosemide  40 mg Oral BID  . insulin aspart  0-9 Units Subcutaneous TID WC  . insulin aspart protamine-insulin aspart  70 Units  Subcutaneous Q supper  . insulin aspart protamine-insulin aspart  90 Units Subcutaneous Q breakfast  . lisinopril  20 mg Oral Daily  . metoprolol  50 mg Oral Daily  . metoprolol succinate  50 mg Oral Once  . ondansetron  4 mg Intravenous Once  . oxyCODONE  15 mg Oral Q6H  . simvastatin  10 mg Oral q1800  . sodium chloride  1,000 mL Intravenous Once  . sodium chloride  3 mL Intravenous Q12H  . DISCONTD: insulin aspart protamine-insulin aspart  70-90 Units Subcutaneous BID WC      Assessment:  41 y.o. male admitted on 10/12/2011, with R thigh cellulitis and abscess. Pharmacy consulted to manage vancomycin and Zosyn. Per RN, pt weighed at 477 kg.    Goal of Therapy:  1. Vancomycin trough level 10-15 mcg/ml  Plan:  1. Zosyn 3.375gm IV Q8H (4 hr infusion). 2. Vancomycin 2.5 gm IV x 1, then vancomycin 2 gm IV Q8H.  3.   Will obtain vancomycin trough when indicated.    Dineen Kid Inas Avena, PharmD 10/13/2011, 1:15 AM

## 2011-10-13 NOTE — Progress Notes (Signed)
  Subjective: unchanged  Objective: Vital signs in last 24 hours: Temp:  [98 F (36.7 C)-99.5 F (37.5 C)] 98.1 F (36.7 C) (01/11 0500) Pulse Rate:  [93-126] 93  (01/11 0500) Resp:  [15-32] 20  (01/11 0500) BP: (88-147)/(59-86) 112/61 mmHg (01/11 0500) SpO2:  [90 %-96 %] 90 % (01/11 0500) Weight:  [469 lb 5.8 oz (212.9 kg)] 469 lb 5.8 oz (212.9 kg) (01/11 0500) Last BM Date: 10/11/11  Intake/Output from previous day:   Intake/Output this shift:    tender right inner thigh mass draining bloody purulent fluid with erythema  Lab Results:   [facility] 10/13/11 0546 10/12/11 1923  WBC 14.0* 15.7*  HGB 14.2 15.0  HCT 41.4 43.3  PLT 221 232   BMET  Basename 10/13/11 0546 10/12/11 1923  NA 139 136  K 3.7 3.6  CL 104 101  CO2 24 23  GLUCOSE 206* 234*  BUN 20 22  CREATININE 1.01 1.03  CALCIUM 8.9 9.3   PT/INR  Basename 10/12/11 2051  LABPROT 15.0  INR 1.16   ABG No results found for this basename: PHART:2,PCO2:2,PO2:2,HCO3:2 in the last 72 hours  Studies/Results: No results found.  Anti-infectives: Anti-infectives     Start     Dose/Rate Route Frequency Ordered Stop   10/13/11 1200   vancomycin (VANCOCIN) 2,000 mg in sodium chloride 0.9 % 500 mL IVPB  Status:  Discontinued        2,000 mg 250 mL/hr over 120 Minutes Intravenous Every 12 hours 10/13/11 0131 10/13/11 0136   10/13/11 1000   vancomycin (VANCOCIN) 2,000 mg in sodium chloride 0.9 % 500 mL IVPB        2,000 mg 250 mL/hr over 120 Minutes Intravenous Every 8 hours 10/13/11 0136     10/13/11 0200  piperacillin-tazobactam (ZOSYN) IVPB 3.375 g       3.375 g 12.5 mL/hr over 240 Minutes Intravenous Every 8 hours 10/13/11 0125     10/13/11 0200   vancomycin (VANCOCIN) 2,500 mg in sodium chloride 0.9 % 500 mL IVPB        2,500 mg 250 mL/hr over 120 Minutes Intravenous  Once 10/13/11 0125 10/13/11 0358   10/12/11 1930   clindamycin (CLEOCIN) IVPB 600 mg        600 mg 100 mL/hr over 30 Minutes  Intravenous  Once 10/12/11 1923 10/12/11 2221          Assessment/Plan:  Right thigh abscess Plan incision, drainage and possible debridement in OR today   LOS: 1 day    [facility] 10/13/2011

## 2011-10-13 NOTE — Anesthesia Postprocedure Evaluation (Signed)
  Anesthesia Post-op Note  Patient: [name]  Procedure(s) Performed:  IRRIGATION AND DEBRIDEMENT ABSCESS - Irrigation and debridement right thigh abscess  Patient Location: PACU  Anesthesia Type: General  Level of Consciousness: awake, alert  and oriented  Airway and Oxygen Therapy: Patient Spontanous Breathing and Patient connected to nasal cannula oxygen  Post-op Pain: none  Post-op Assessment: Post-op Vital signs reviewed, Patient's Cardiovascular Status Stable, Respiratory Function Stable, Patent Airway, No signs of Nausea or vomiting and Pain level controlled  Post-op Vital Signs: Reviewed and stable  Complications: No apparent anesthesia complications

## 2011-10-14 LAB — GLUCOSE, CAPILLARY: Glucose-Capillary: 386 mg/dL — ABNORMAL HIGH (ref 70–99)

## 2011-10-14 MED ORDER — INSULIN ASPART 100 UNIT/ML ~~LOC~~ SOLN
8.0000 [IU] | Freq: Three times a day (TID) | SUBCUTANEOUS | Status: DC
  Administered 2011-10-14 – 2011-10-19 (×13): 8 [IU] via SUBCUTANEOUS

## 2011-10-14 MED ORDER — INSULIN ASPART PROT & ASPART (70-30 MIX) 100 UNIT/ML ~~LOC~~ SUSP
80.0000 [IU] | Freq: Every day | SUBCUTANEOUS | Status: DC
  Administered 2011-10-14 – 2011-10-18 (×5): 80 [IU] via SUBCUTANEOUS

## 2011-10-14 MED ORDER — VANCOMYCIN HCL 1000 MG IV SOLR
750.0000 mg | Freq: Three times a day (TID) | INTRAVENOUS | Status: DC
  Administered 2011-10-14 – 2011-10-15 (×2): 750 mg via INTRAVENOUS
  Filled 2011-10-14 (×4): qty 750

## 2011-10-14 MED ORDER — INSULIN ASPART PROT & ASPART (70-30 MIX) 100 UNIT/ML ~~LOC~~ SUSP
100.0000 [IU] | Freq: Every day | SUBCUTANEOUS | Status: DC
  Administered 2011-10-15 – 2011-10-18 (×4): 100 [IU] via SUBCUTANEOUS
  Filled 2011-10-14 (×5): qty 3

## 2011-10-14 MED ORDER — INSULIN ASPART 100 UNIT/ML ~~LOC~~ SOLN
5.0000 [IU] | Freq: Once | SUBCUTANEOUS | Status: AC
  Administered 2011-10-14: 5 [IU] via SUBCUTANEOUS

## 2011-10-14 NOTE — Progress Notes (Signed)
ANTIBIOTIC CONSULT NOTE - FOLLOW UP  Pharmacy Consult for Vancomycin Indication: Cellulitis and abscess of right thigh   Allergies  Allergen Reactions  . Shellfish Allergy Anaphylaxis  . Iodine     Patient Measurements: Height: 6\' 3"  (190.5 cm) Weight: 440 lb 14.7 oz (200 kg) IBW/kg (Calculated) : 84.5   Vital Signs: Temp: 99 F (37.2 C) (01/12 0500) BP: 129/67 mmHg (01/12 0500) Pulse Rate: 82  (01/12 1148) Intake/Output from previous day: 01/11 0701 - 01/12 0700 In: 4975 [P.O.:1350; I.V.:2125; IV Piggyback:1500] Out: 650 [Urine:600; Blood:50] Intake/Output from this shift:    Labs:  [facility] 10/13/11 0546 10/12/11 1923  WBC 14.0* 15.7*  HGB 14.2 15.0  PLT 221 232  LABCREA -- --  CREATININE 1.01 1.03   Estimated Creatinine Clearance: 177.9 ml/min (by C-G formula based on Cr of 1.01).  Basename 10/14/11 1020  VANCOTROUGH 37.8*  VANCOPEAK --  VANCORANDOM --  GENTTROUGH --  GENTPEAK --  GENTRANDOM --  TOBRATROUGH --  TOBRAPEAK --  TOBRARND --  AMIKACINPEAK --  AMIKACINTROU --  AMIKACIN --     Microbiology: Recent Results (from the past 720 hour(s))  CULTURE, ROUTINE-ABSCESS     Status: Normal (Preliminary result)   Collection Time   10/12/11  7:44 PM      Component Value Range Status Comment   Specimen Description ABSCESS THIGH RIGHT   Final    Special Requests NONE   Final    Gram Stain     Final    Value: NO SQUAMOUS EPITHELIAL CELLS SEEN     RARE GRAM POSITIVE COCCI     IN PAIRS   Culture NO GROWTH 1 DAY   Final    Report Status PENDING   Incomplete   CULTURE, ROUTINE-ABSCESS     Status: Normal (Preliminary result)   Collection Time   10/13/11 12:08 PM      Component Value Range Status Comment   Specimen Description ABSCESS THIGH RIGHT   Final    Special Requests NONE   Final    Gram Stain     Final    Value: FEW WBC PRESENT,BOTH PMN AND MONONUCLEAR     NO SQUAMOUS EPITHELIAL CELLS SEEN     NO ORGANISMS SEEN   Culture NO GROWTH 1 DAY   Final     Report Status PENDING   Incomplete   ANAEROBIC CULTURE     Status: Normal (Preliminary result)   Collection Time   10/13/11 12:08 PM      Component Value Range Status Comment   Specimen Description ABSCESS THIGH RIGHT   Final    Special Requests NONE   Final    Gram Stain     Final    Value: FEW WBC PRESENT,BOTH PMN AND MONONUCLEAR     NO SQUAMOUS EPITHELIAL CELLS SEEN     NO ORGANISMS SEEN   Culture     Final    Value: NO ANAEROBES ISOLATED; CULTURE IN PROGRESS FOR 5 DAYS   Report Status PENDING   Incomplete     Anti-infectives     Start     Dose/Rate Route Frequency Ordered Stop   10/14/11 2200   vancomycin (VANCOCIN) 750 mg in sodium chloride 0.9 % 150 mL IVPB        750 mg 150 mL/hr over 60 Minutes Intravenous Every 8 hours 10/14/11 1229     10/13/11 1200   vancomycin (VANCOCIN) 2,000 mg in sodium chloride 0.9 % 500 mL IVPB  Status:  Discontinued  2,000 mg 250 mL/hr over 120 Minutes Intravenous Every 12 hours 10/13/11 0131 10/13/11 0136   10/13/11 1000   vancomycin (VANCOCIN) 2,000 mg in sodium chloride 0.9 % 500 mL IVPB  Status:  Discontinued        2,000 mg 250 mL/hr over 120 Minutes Intravenous Every 8 hours 10/13/11 0136 10/14/11 1229   10/13/11 0200   piperacillin-tazobactam (ZOSYN) IVPB 3.375 g  Status:  Discontinued        3.375 g 12.5 mL/hr over 240 Minutes Intravenous Every 8 hours 10/13/11 0125 10/13/11 1425   10/13/11 0200   vancomycin (VANCOCIN) 2,500 mg in sodium chloride 0.9 % 500 mL IVPB        2,500 mg 250 mL/hr over 120 Minutes Intravenous  Once 10/13/11 0125 10/13/11 0358   10/12/11 1930   clindamycin (CLEOCIN) IVPB 600 mg        600 mg 100 mL/hr over 30 Minutes Intravenous  Once 10/12/11 1923 10/12/11 2221          Assessment: 41 YO obese male on day #2 Vancomycin for cellulitis and abscess of right thigh, +swelling, morbid obesity, I&D debridement yesterday, Cxs pending, zosyn d/c'd. WT 200kg, was on vanco 2g Q8h, received 3 doses.  Vancomycin trough level supratherapeutic this morning. Scr stable, est. crcl > 100, afebrile, WBC trending down  Goal of Therapy:  Vancomycin trough level 10-15 mcg/ml  Plan:  - decrease vancomycin dose to 750mg  IV Q8hrs - f/u renal function and Cxs, may recheck vancomycin trough after 3-5 doses if continues  Riki Rusk 10/14/2011,12:32 PM

## 2011-10-14 NOTE — Progress Notes (Signed)
1 Day Post-Op  Subjective: Sore at surgical site  Objective: Vital signs in last 24 hours: Temp:  [97.2 F (36.2 C)-99 F (37.2 C)] 99 F (37.2 C) (01/12 0500) Pulse Rate:  [77-105] 82  (01/12 1148) Resp:  [20-24] 22  (01/12 1148) BP: (94-129)/(49-78) 129/67 mmHg (01/12 0500) SpO2:  [90 %-98 %] 90 % (01/12 1148) Weight:  [440 lb 14.7 oz (200 kg)] 440 lb 14.7 oz (200 kg) (01/12 0500) Last BM Date: 10/13/11  Intake/Output from previous day: 01/11 0701 - 01/12 0700 In: 4975 [P.O.:1350; I.V.:2125; IV Piggyback:1500] Out: 650 [Urine:600; Blood:50] Intake/Output this shift:    Incision/Wound: Right thigh wound open draining expected fluid, erythema better  Lab Results:   [facility] 10/13/11 0546 10/12/11 1923  WBC 14.0* 15.7*  HGB 14.2 15.0  HCT 41.4 43.3  PLT 221 232   BMET  Basename 10/13/11 0546 10/12/11 1923  NA 139 136  K 3.7 3.6  CL 104 101  CO2 24 23  GLUCOSE 206* 234*  BUN 20 22  CREATININE 1.01 1.03  CALCIUM 8.9 9.3   PT/INR  Basename 10/12/11 2051  LABPROT 15.0  INR 1.16   ABG No results found for this basename: PHART:2,PCO2:2,PO2:2,HCO3:2 in the last 72 hours  Studies/Results: No results found.  Anti-infectives: Anti-infectives     Start     Dose/Rate Route Frequency Ordered Stop   10/14/11 2200   vancomycin (VANCOCIN) 750 mg in sodium chloride 0.9 % 150 mL IVPB        750 mg 150 mL/hr over 60 Minutes Intravenous Every 8 hours 10/14/11 1229     10/13/11 1200   vancomycin (VANCOCIN) 2,000 mg in sodium chloride 0.9 % 500 mL IVPB  Status:  Discontinued        2,000 mg 250 mL/hr over 120 Minutes Intravenous Every 12 hours 10/13/11 0131 10/13/11 0136   10/13/11 1000   vancomycin (VANCOCIN) 2,000 mg in sodium chloride 0.9 % 500 mL IVPB  Status:  Discontinued        2,000 mg 250 mL/hr over 120 Minutes Intravenous Every 8 hours 10/13/11 0136 10/14/11 1229   10/13/11 0200   piperacillin-tazobactam (ZOSYN) IVPB 3.375 g  Status:  Discontinued        3.375 g 12.5 mL/hr over 240 Minutes Intravenous Every 8 hours 10/13/11 0125 10/13/11 1425   10/13/11 0200   vancomycin (VANCOCIN) 2,500 mg in sodium chloride 0.9 % 500 mL IVPB        2,500 mg 250 mL/hr over 120 Minutes Intravenous  Once 10/13/11 0125 10/13/11 0358   10/12/11 1930   clindamycin (CLEOCIN) IVPB 600 mg        600 mg 100 mL/hr over 30 Minutes Intravenous  Once 10/12/11 1923 10/12/11 2221          Assessment/Plan: POD 1 i/d and debridment of right thigh abscess  Start dressing changes today Cont abx,  GS without organisms, await cx results Will f/u on Monday call if need [place] sooner   LOS: 2 days    [facility] 10/14/2011

## 2011-10-14 NOTE — Progress Notes (Signed)
Subjective:  41 year-old male with history of diabetes mellitus type 2, atrial fibrillation, hypertension, morbid obesity, OSA presents because of worsening swelling in his right thigh with pain and discharge. He noticed a small lesion 3 weeks ago on his medial aspect of his right thigh which gradually worsened in the last 48 hours was causing pain and discharge. He was admitted on 10/12/11 and underwent I and D in the OR on 10/13/11.   Lethargic but easily arousable and oriented. C/o pain at surgical site   Physical Exam: Blood pressure 129/67, pulse 100, temperature 99 F (37.2 C), temperature source Oral, resp. rate 20, height 6\' 3"  (1.905 m), weight 200 kg (440 lb 14.7 oz), SpO2 90.00%.  Patient Vitals for the past 24 hrs:  BP Temp Pulse Resp SpO2 Weight  10/14/11 0500 129/67 mmHg 99 F (37.2 C) 100  20  90 % 200 kg (440 lb 14.7 oz)  10/13/11 2200 116/78 mmHg 98.8 F (37.1 C) 105  20  94 % -  10/13/11 1340 - 97.2 F (36.2 C) - - - -  10/13/11 1328 106/55 mmHg - 77  21  94 % -  10/13/11 1313 108/55 mmHg - 80  24  93 % -  10/13/11 1259 - - 79  24  98 % -  10/13/11 1258 94/49 mmHg - - - - -  10/13/11 1245 - 97.6 F (36.4 C) - - - -   CBG (last 3)   Basename 10/14/11 0748 10/14/11 0026 10/13/11 2101  GLUCAP 397* 386* 473*   Alert oriented Cvs: rrr Rs: ctab Thigh with dressing   Investigations:  Recent Results (from the past 240 hour(s))  CULTURE, ROUTINE-ABSCESS     Status: Normal (Preliminary result)   Collection Time   10/12/11  7:44 PM      Component Value Range Status Comment   Specimen Description ABSCESS THIGH RIGHT   Final    Special Requests NONE   Final    Gram Stain     Final    Value: NO SQUAMOUS EPITHELIAL CELLS SEEN     RARE GRAM POSITIVE COCCI     IN PAIRS   Culture NO GROWTH 1 DAY   Final    Report Status PENDING   Incomplete   CULTURE, ROUTINE-ABSCESS     Status: Normal (Preliminary result)   Collection Time   10/13/11 12:08 PM      Component  Value Range Status Comment   Specimen Description ABSCESS THIGH RIGHT   Final    Special Requests NONE   Final    Gram Stain     Final    Value: FEW WBC PRESENT,BOTH PMN AND MONONUCLEAR     NO SQUAMOUS EPITHELIAL CELLS SEEN     NO ORGANISMS SEEN   Culture NO GROWTH 1 DAY   Final    Report Status PENDING   Incomplete   ANAEROBIC CULTURE     Status: Normal (Preliminary result)   Collection Time   10/13/11 12:08 PM      Component Value Range Status Comment   Specimen Description ABSCESS THIGH RIGHT   Final    Special Requests NONE   Final    Gram Stain     Final    Value: FEW WBC PRESENT,BOTH PMN AND MONONUCLEAR     NO SQUAMOUS EPITHELIAL CELLS SEEN     NO ORGANISMS SEEN   Culture PENDING   Incomplete    Report Status PENDING   Incomplete  Basic Metabolic Panel:  Basename 10/13/11 0546 10/12/11 1923  NA 139 136  K 3.7 3.6  CL 104 101  CO2 24 23  GLUCOSE 206* 234*  BUN 20 22  CREATININE 1.01 1.03  CALCIUM 8.9 9.3  MG -- --  PHOS -- --   Liver Function Tests:  [facility] 10/13/11 0546  AST 11  ALT 5  ALKPHOS 83  BILITOT 0.6  PROT 7.7  ALBUMIN 3.0*     CBC:  Basename 10/13/11 0546 10/12/11 1923  WBC 14.0* 15.7*  NEUTROABS -- 11.4*  HGB 14.2 15.0  HCT 41.4 43.3  MCV 80.2 79.7  PLT 221 232    No results found.    Medications:  Scheduled:    . amiodarone  200 mg Oral Daily  . digoxin  250 mcg Oral Daily  . divalproex  1,000 mg Oral Daily  . furosemide  40 mg Oral BID  . influenza  inactive virus vaccine  0.5 mL Intramuscular Tomorrow-1000  . insulin aspart  0-9 Units Subcutaneous TID WC  . insulin aspart  6 Units Subcutaneous Once  . insulin aspart protamine-insulin aspart  100 Units Subcutaneous Q breakfast  . insulin aspart protamine-insulin aspart  80 Units Subcutaneous Q supper  . lisinopril  20 mg Oral Daily  . metoprolol succinate  50 mg Oral Daily  . oxyCODONE  15 mg Oral Q6H  . simvastatin  10 mg Oral q1800  . sodium chloride  3 mL  Intravenous Q12H  . vancomycin  2,000 mg Intravenous Q8H  . DISCONTD: insulin aspart protamine-insulin aspart  70 Units Subcutaneous Q supper  . DISCONTD: insulin aspart protamine-insulin aspart  90 Units Subcutaneous Q breakfast  . DISCONTD: piperacillin-tazobactam (ZOSYN)  IV  3.375 g Intravenous Q8H   Continuous:    . DISCONTD: lactated ringers 50 mL/hr at 10/13/11 1058  . DISCONTD: lactated ringers 75 mL/hr at 10/13/11 1752    Impression:  Principal Problem:  *Cellulitis of thigh Active Problems:  DIABETES MELLITUS, TYPE II  Morbid obesity  OBSTRUCTIVE SLEEP APNEA  Atrial fibrillation  CHF     Plan: Resume CPAP Add novolog with meals Increase the dose of 70/30 Await cultures Continue current abx     LOS: 2 days   Vallie Teters, MD Pager: [phone] 10/14/2011, 8:41 AM

## 2011-10-14 NOTE — Progress Notes (Signed)
10/14/11  Dr. Lavera Guise was called  repoerted cbg 436 he did not want lab draw only that  He would increase insulin at this time [address], Scientist, research (physical sciences)

## 2011-10-15 DIAGNOSIS — N179 Acute kidney failure, unspecified: Secondary | ICD-10-CM | POA: Diagnosis not present

## 2011-10-15 LAB — CBC
HCT: 36.2 % — ABNORMAL LOW (ref 39.0–52.0)
Hemoglobin: 12.3 g/dL — ABNORMAL LOW (ref 13.0–17.0)
MCH: 27.6 pg (ref 26.0–34.0)
MCHC: 34 g/dL (ref 30.0–36.0)
MCV: 81.2 fL (ref 78.0–100.0)
Platelets: 204 K/uL (ref 150–400)
RBC: 4.46 MIL/uL (ref 4.22–5.81)
RDW: 14.1 % (ref 11.5–15.5)
WBC: 10.9 K/uL — ABNORMAL HIGH (ref 4.0–10.5)

## 2011-10-15 LAB — BASIC METABOLIC PANEL WITH GFR
BUN: 32 mg/dL — ABNORMAL HIGH (ref 6–23)
CO2: 22 meq/L (ref 19–32)
Calcium: 8 mg/dL — ABNORMAL LOW (ref 8.4–10.5)
Chloride: 96 meq/L (ref 96–112)
Creatinine, Ser: 4.08 mg/dL — ABNORMAL HIGH (ref 0.50–1.35)
GFR calc Af Amer: 19 mL/min — ABNORMAL LOW (ref >90)
GFR calc non Af Amer: 17 mL/min — ABNORMAL LOW (ref >90)
Glucose, Bld: 425 mg/dL — ABNORMAL HIGH (ref 70–99)
Potassium: 4.7 meq/L (ref 3.5–5.1)
Sodium: 129 meq/L — ABNORMAL LOW (ref 135–145)

## 2011-10-15 LAB — GLUCOSE, CAPILLARY
Glucose-Capillary: 371 mg/dL — ABNORMAL HIGH (ref 70–99)
Glucose-Capillary: 296 mg/dL — ABNORMAL HIGH (ref 70–99)
Glucose-Capillary: 292 mg/dL — ABNORMAL HIGH (ref 70–99)

## 2011-10-15 MED ORDER — SODIUM CHLORIDE 0.9 % IV SOLN
INTRAVENOUS | Status: DC
  Administered 2011-10-15 – 2011-10-16 (×2): via INTRAVENOUS

## 2011-10-15 MED ORDER — SODIUM CHLORIDE 0.9 % IV BOLUS (SEPSIS)
500.0000 mL | Freq: Once | INTRAVENOUS | Status: AC
  Administered 2011-10-15: 500 mL via INTRAVENOUS

## 2011-10-15 NOTE — Progress Notes (Signed)
2359 RT NOTE: Pt placed on CPAP via Aflex mode,for hs.

## 2011-10-15 NOTE — Progress Notes (Signed)
Subjective:  41 year-old male with history of diabetes mellitus type 2, atrial fibrillation, hypertension, morbid obesity, OSA presents because of worsening swelling in his right thigh with pain and discharge. He noticed a small lesion 3 weeks ago on his medial aspect of his right thigh which gradually worsened in the last 48 hours was causing pain and discharge. He was admitted on 10/12/11 and underwent I and D in the OR on 10/13/11.   Has no complains. Denies dyspnea, chest pain, nausea or vomiting. Has noticed that he has not had any urine this AM  Physical Exam: Blood pressure 98/60, pulse 77, temperature 99 F (37.2 C), temperature source Axillary, resp. rate 18, height 6\' 3"  (1.905 m), weight 200 kg (440 lb 14.7 oz), SpO2 93.00%.  Patient Vitals for the past 24 hrs:  BP Temp Temp src Pulse Resp SpO2  10/15/11 0628 98/60 mmHg - - - - -  10/15/11 0500 88/55 mmHg 99 F (37.2 C) Axillary 77  18  93 %  10/14/11 2100 119/72 mmHg 98.6 F (37 C) - 85  18  93 %  10/14/11 1400 96/62 mmHg 98.2 F (36.8 C) Oral 92  22  89 %  10/14/11 1148 - - - 82  22  90 %  Non toxic appearing   Alert and oriented x3 CVS: RRR RS: CTAB Adomen obese soft, NT   CBG (last 3)   Basename 10/15/11 0740 10/14/11 2019 10/14/11 1657  GLUCAP 371* 388* 496*    I/O last 3 completed shifts: In: 2835 [P.O.:360; I.V.:975; IV Piggyback:1500] Out: -  Investigations:  Recent Results (from the past 240 hour(s))  CULTURE, ROUTINE-ABSCESS     Status: Normal (Preliminary result)   Collection Time   10/12/11  7:44 PM      Component Value Range Status Comment   Specimen Description ABSCESS THIGH RIGHT   Final    Special Requests NONE   Final    Gram Stain     Final    Value: PA WBC PRESENT, PREDOMINANTLY PMN     NO SQUAMOUS EPITHELIAL CELLS SEEN     RARE GRAM POSITIVE COCCI     IN PAIRS   Culture Culture reincubated for better growth   Final    Report Status PENDING   Incomplete   CULTURE, ROUTINE-ABSCESS      Status: Normal (Preliminary result)   Collection Time   10/13/11 12:08 PM      Component Value Range Status Comment   Specimen Description ABSCESS THIGH RIGHT   Final    Special Requests NONE   Final    Gram Stain     Final    Value: FEW WBC PRESENT,BOTH PMN AND MONONUCLEAR     NO SQUAMOUS EPITHELIAL CELLS SEEN     NO ORGANISMS SEEN   Culture Culture reincubated for better growth   Final    Report Status PENDING   Incomplete   ANAEROBIC CULTURE     Status: Normal (Preliminary result)   Collection Time   10/13/11 12:08 PM      Component Value Range Status Comment   Specimen Description ABSCESS THIGH RIGHT   Final    Special Requests NONE   Final    Gram Stain     Final    Value: FEW WBC PRESENT,BOTH PMN AND MONONUCLEAR     NO SQUAMOUS EPITHELIAL CELLS SEEN     NO ORGANISMS SEEN   Culture     Final    Value: NO ANAEROBES  ISOLATED; CULTURE IN PROGRESS FOR 5 DAYS   Report Status PENDING   Incomplete      Basic Metabolic Panel:  Basename 10/15/11 0600 10/13/11 0546  NA 129* 139  K 4.7 3.7  CL 96 104  CO2 22 24  GLUCOSE 425* 206*  BUN 32* 20  CREATININE 4.08* 1.01  CALCIUM 8.0* 8.9  MG -- --  PHOS -- --   Liver Function Tests:  [facility] 10/13/11 0546  AST 11  ALT 5  ALKPHOS 83  BILITOT 0.6  PROT 7.7  ALBUMIN 3.0*     CBC:  Basename 10/15/11 0600 10/13/11 0546 10/12/11 1923  WBC 10.9* 14.0* --  NEUTROABS -- -- 11.4*  HGB 12.3* 14.2 --  HCT 36.2* 41.4 --  MCV 81.2 80.2 --  PLT 204 221 --    No results found.    Medications:  Scheduled:    . amiodarone  200 mg Oral Daily  . divalproex  1,000 mg Oral Daily  . insulin aspart  0-9 Units Subcutaneous TID WC  . insulin aspart  5 Units Subcutaneous Once  . insulin aspart  8 Units Subcutaneous TID WC  . insulin aspart protamine-insulin aspart  100 Units Subcutaneous Q breakfast  . insulin aspart protamine-insulin aspart  80 Units Subcutaneous Q supper  . oxyCODONE  15 mg Oral Q6H  . simvastatin  10 mg  Oral q1800  . sodium chloride  500 mL Intravenous Once  . sodium chloride  3 mL Intravenous Q12H  . DISCONTD: digoxin  250 mcg Oral Daily  . DISCONTD: furosemide  40 mg Oral BID  . DISCONTD: lisinopril  20 mg Oral Daily  . DISCONTD: metoprolol succinate  50 mg Oral Daily  . DISCONTD: vancomycin  2,000 mg Intravenous Q8H  . DISCONTD: vancomycin  750 mg Intravenous Q8H   Continuous:    . sodium chloride      Impression:  Principal Problem:  *Cellulitis of thigh Active Problems:  Acute renal failure  DIABETES MELLITUS, TYPE II  Morbid obesity  OBSTRUCTIVE SLEEP APNEA  Atrial fibrillation  CHF   ARF today - suspect due to meds, hypotension r/o ATN due to meds. Doubt sepsis.   Plan: Stop all BP meds Bolus NS and start cont rate Strict Is/Os Vanc level UA      LOS: 3 days   Yena Tisby, MD Pager: [phone] 10/15/2011, 11:03 AM

## 2011-10-16 ENCOUNTER — Encounter (HOSPITAL_COMMUNITY): Payer: Self-pay | Admitting: General Surgery

## 2011-10-16 LAB — GLUCOSE, CAPILLARY
Glucose-Capillary: 254 mg/dL — ABNORMAL HIGH (ref 70–99)
Glucose-Capillary: 163 mg/dL — ABNORMAL HIGH (ref 70–99)

## 2011-10-16 LAB — CBC
HCT: 35.5 % — ABNORMAL LOW (ref 39.0–52.0)
MCHC: 33.2 g/dL (ref 30.0–36.0)
MCV: 81.4 fL (ref 78.0–100.0)
Platelets: 201 10*3/uL (ref 150–400)
RDW: 14 % (ref 11.5–15.5)
WBC: 7.9 10*3/uL (ref 4.0–10.5)

## 2011-10-16 LAB — BASIC METABOLIC PANEL
BUN: 34 mg/dL — ABNORMAL HIGH (ref 6–23)
CO2: 22 mEq/L (ref 19–32)
Calcium: 8.1 mg/dL — ABNORMAL LOW (ref 8.4–10.5)
Creatinine, Ser: 3.84 mg/dL — ABNORMAL HIGH (ref 0.50–1.35)
GFR calc Af Amer: 21 mL/min — ABNORMAL LOW (ref >90)

## 2011-10-16 NOTE — Progress Notes (Signed)
narrative  41 year-old male with history of diabetes mellitus type 2, atrial fibrillation, hypertension, morbid obesity, OSA presents because of worsening swelling in his right thigh with pain and discharge. He noticed a small lesion 3 weeks ago on his medial aspect of his right thigh which gradually worsened in the last 48 hours was causing pain and discharge. He was admitted on 10/12/11 and underwent I and D in the OR on 10/13/11. Postop he has developed hypotension and acute renal failure. He responded to iv fluids and the creatinine has stabilizied. He is making urine   Subjective No complaints today  Physical Exam: Blood pressure 139/64, pulse 86, temperature 98.1 F (36.7 C), temperature source Oral, resp. rate 20, height 6\' 3"  (1.905 m), weight 203.121 kg (447 lb 12.8 oz), SpO2 93.00%.  Patient Vitals for the past 24 hrs:  BP Temp Temp src Pulse Resp SpO2 Weight  10/16/11 1400 139/64 mmHg 98.1 F (36.7 C) Oral 86  20  93 % -  10/16/11 0500 114/58 mmHg 99 F (37.2 C) - 80  18  100 % 203.121 kg (447 lb 12.8 oz)  10/15/11 2100 105/57 mmHg 98.6 F (37 C) - 91  18  92 % 209.607 kg (462 lb 1.6 oz)  Non toxic appearing   Alert and oriented x3 CVS: RRR RS: CTAB Adomen obese soft, NT   CBG (last 3)   Basename 10/16/11 1153 10/16/11 0721 10/15/11 2155  GLUCAP 186* 254* 260*    I/O last 3 completed shifts: In: 4263 [P.O.:960; I.V.:2803; IV Piggyback:500] Out: 900 [Urine:900] Investigations:  Recent Results (from the past 240 hour(s))  CULTURE, ROUTINE-ABSCESS     Status: Normal   Collection Time   10/12/11  7:44 PM      Component Value Range Status Comment   Specimen Description ABSCESS THIGH RIGHT   Final    Special Requests NONE   Final    Gram Stain     Final    Value: PA WBC PRESENT, PREDOMINANTLY PMN     NO SQUAMOUS EPITHELIAL CELLS SEEN     RARE GRAM POSITIVE COCCI     IN PAIRS   Culture     Final    Value: FEW STREPTOCOCCUS SPECIES     Note: NON TYPEABLE BETA  HEMOLYTIC   Report Status 10/16/2011 FINAL   Final   CULTURE, ROUTINE-ABSCESS     Status: Normal (Preliminary result)   Collection Time   10/13/11 12:08 PM      Component Value Range Status Comment   Specimen Description ABSCESS THIGH RIGHT   Final    Special Requests NONE   Final    Gram Stain     Final    Value: FEW WBC PRESENT,BOTH PMN AND MONONUCLEAR     NO SQUAMOUS EPITHELIAL CELLS SEEN     NO ORGANISMS SEEN   Culture Culture reincubated for better growth   Final    Report Status PENDING   Incomplete   ANAEROBIC CULTURE     Status: Normal (Preliminary result)   Collection Time   10/13/11 12:08 PM      Component Value Range Status Comment   Specimen Description ABSCESS THIGH RIGHT   Final    Special Requests NONE   Final    Gram Stain     Final    Value: FEW WBC PRESENT,BOTH PMN AND MONONUCLEAR     NO SQUAMOUS EPITHELIAL CELLS SEEN     NO ORGANISMS SEEN   Culture  Final    Value: NO ANAEROBES ISOLATED; CULTURE IN PROGRESS FOR 5 DAYS   Report Status PENDING   Incomplete      Basic Metabolic Panel:  Basename 10/16/11 0647 10/15/11 0600  NA 133* 129*  K 4.7 4.7  CL 101 96  CO2 22 22  GLUCOSE 280* 425*  BUN 34* 32*  CREATININE 3.84* 4.08*  CALCIUM 8.1* 8.0*  MG -- --  PHOS -- --   Liver Function Tests: No results found for this basename: AST:2,ALT:2,ALKPHOS:2,BILITOT:2,PROT:2,ALBUMIN:2 in the last 72 hours   CBC:  Basename 10/16/11 0647 10/15/11 0600  WBC 7.9 10.9*  NEUTROABS -- --  HGB 11.8* 12.3*  HCT 35.5* 36.2*  MCV 81.4 81.2  PLT 201 204    No results found.    Medications:  Scheduled:    . amiodarone  200 mg Oral Daily  . divalproex  1,000 mg Oral Daily  . insulin aspart  0-9 Units Subcutaneous TID WC  . insulin aspart  8 Units Subcutaneous TID WC  . insulin aspart protamine-insulin aspart  100 Units Subcutaneous Q breakfast  . insulin aspart protamine-insulin aspart  80 Units Subcutaneous Q supper  . oxyCODONE  15 mg Oral Q6H  .  simvastatin  10 mg Oral q1800  . sodium chloride  3 mL Intravenous Q12H   Continuous:    . sodium chloride 20 mL/hr at 10/16/11 1124   Results for [name], [name] (MRN [id number]) as of 10/16/2011 17:21  Ref. Range 10/12/2011 19:41  Color, Urine Latest Range: YELLOW  YELLOW  APPearance Latest Range: CLEAR  HAZY (A)  Specific Gravity, Urine Latest Range: 1.005-1.030  1.016  pH Latest Range: 5.0-8.0  5.0  Glucose, UA Latest Range: NEGATIVE mg/dL NEGATIVE  Bilirubin Urine Latest Range: NEGATIVE  SMALL (A)  Ketones, ur Latest Range: NEGATIVE mg/dL NEGATIVE  Protein Latest Range: NEGATIVE mg/dL 30 (A)  Urobilinogen, UA Latest Range: 0.0-1.0 mg/dL 1.0  Nitrite Latest Range: NEGATIVE  NEGATIVE  Leukocytes, UA Latest Range: NEGATIVE  LARGE (A)  Urine-Other No range found MUCOUS PRESENT  WBC, UA Latest Range: <3 WBC/hpf 21-50  RBC / HPF Latest Range: <3 RBC/hpf 3-6  Squamous Epithelial / LPF Latest Range: RARE  MANY (A)  Bacteria, UA Latest Range: RARE  FEW (A)   A/P    *Cellulitis of thigh and abscess - s/p I and D . Culture growing Streptococcus. Vancomycin was discontinued on January 13. Continue Zosyn. Once the final speciation is available we might be able to narrow down to Rocephin. Some species of Streptococcus have some anaerobic activity. Surgeries ordering a wound vac.   Acute renal failure: - suspect due to postop hypotension, meds, r/o AIN/ATN as the sediment is very active.   Good urinary output. Creatinine has went down . Vanc trough still elevated. Continue iv fluids, f/u labs tomorrow.    DIABETES MELLITUS, TYPE II -on 70/30 and meal coverage   Morbid obesity   OBSTRUCTIVE SLEEP APNEA- cpap qhs     LOS: 4 days   Om Lizotte, MD Pager: [phone] 10/16/2011, 5:19 PM

## 2011-10-16 NOTE — Progress Notes (Signed)
LOS: 4 days   Subjective: No new c/o. Pain under barely adequate control.  Objective: Vital signs in last 24 hours: Temp:  [98.6 F (37 C)-99 F (37.2 C)] 99 F (37.2 C) (01/14 0500) Pulse Rate:  [80-91] 80  (01/14 0500) Resp:  [18] 18  (01/14 0500) BP: (105-122)/(57-76) 114/58 mmHg (01/14 0500) SpO2:  [88 %-100 %] 100 % (01/14 0500) FiO2 (%):  [2 %] 2 % (01/13 2100) Weight:  [203.121 kg (447 lb 12.8 oz)-209.607 kg (462 lb 1.6 oz)] 203.121 kg (447 lb 12.8 oz) (01/14 0500) Last BM Date: 10/14/11  Lab Results:  CBC  Basename 10/16/11 0647 10/15/11 0600  WBC 7.9 10.9*  HGB 11.8* 12.3*  HCT 35.5* 36.2*  PLT 201 204   BMET  Basename 10/16/11 0647 10/15/11 0600  NA 133* 129*  K 4.7 4.7  CL 101 96  CO2 22 22  GLUCOSE 280* 425*  BUN 34* 32*  CREATININE 3.84* 4.08*  CALCIUM 8.1* 8.0*    Incision/Wound:Right thigh wound packing removed and replaced. Wound bed appears healthly. No odor, purulence encountered. Tolerated change very well. Possibly small amount of necrotic tissue on distal wound bed surface.  Assessment/Plan: Thigh cellulitis -- Cultures negative thus far. Afebrile, WBC WNL. Continue wet-to-dry dressings.   Freeman Caldron, PA-C Pager: [phone] General Trauma PA Pager: [phone]   10/16/2011

## 2011-10-16 NOTE — Progress Notes (Signed)
Utilization Review Completed.Riva Sesma T1/14/2013   

## 2011-10-16 NOTE — Progress Notes (Addendum)
Results for OUMAR, MARCOTT (MRN [id number]) as of 10/16/2011 15:58  Ref. Range 10/15/2011 11:34 10/15/2011 16:51 10/15/2011 21:55 10/16/2011 07:21 10/16/2011 11:53  Glucose-Capillary Latest Range: 70-99 mg/dL 409 (H) 811 (H) 914 (H) 254 (H) 186 (H)   Patient on 70/30 insulin 100 units q am and 80 units with supper.  He is also on Novolog meal coverage.  Appears very resistant to insulin however CBG's slightly better today.  May consider discontinuation of Novolog meal coverage since patient is on 70/30.  Consider increasing Novolog correction to resistant.  Will follow. Also, please order A1C to determine pre-hospitalization glycemic control.

## 2011-10-16 NOTE — Progress Notes (Signed)
ANTIBIOTIC CONSULT NOTE - FOLLOW UP  Pharmacy Consult for Vancomycin Indication: cellulitis  Allergies  Allergen Reactions  . Shellfish Allergy Anaphylaxis  . Iodine     Patient Measurements: Height: 6\' 3"  (190.5 cm) Weight: 447 lb 12.8 oz (203.121 kg) IBW/kg (Calculated) : 84.5   Vital Signs: Temp: 99 F (37.2 C) (01/14 0500) BP: 114/58 mmHg (01/14 0500) Pulse Rate: 80  (01/14 0500) Intake/Output from previous day: 01/13 0701 - 01/14 0700 In: 4263 [P.O.:960; I.V.:2803; IV Piggyback:500] Out: 900 [Urine:900] Intake/Output from this shift:    Labs:  Basename 10/16/11 0647 10/15/11 0600  WBC 7.9 10.9*  HGB 11.8* 12.3*  PLT 201 204  LABCREA -- --  CREATININE 3.84* 4.08*   Estimated Creatinine Clearance: 47.2 ml/min (by C-G formula based on Cr of 3.84).  Basename 10/16/11 0647 10/15/11 0935 10/14/11 1020  VANCOTROUGH 21.5* -- 37.8*  VANCOPEAK -- -- --  Drue Dun -- 35.4 --  GENTTROUGH -- -- --  GENTPEAK -- -- --  GENTRANDOM -- -- --  TOBRATROUGH -- -- --  TOBRAPEAK -- -- --  TOBRARND -- -- --  AMIKACINPEAK -- -- --  AMIKACINTROU -- -- --  AMIKACIN -- -- --     Microbiology: Recent Results (from the past 720 hour(s))  CULTURE, ROUTINE-ABSCESS     Status: Normal (Preliminary result)   Collection Time   10/12/11  7:44 PM      Component Value Range Status Comment   Specimen Description ABSCESS THIGH RIGHT   Final    Special Requests NONE   Final    Gram Stain     Final    Value: PA WBC PRESENT, PREDOMINANTLY PMN     NO SQUAMOUS EPITHELIAL CELLS SEEN     RARE GRAM POSITIVE COCCI     IN PAIRS   Culture Culture reincubated for better growth   Final    Report Status PENDING   Incomplete   CULTURE, ROUTINE-ABSCESS     Status: Normal (Preliminary result)   Collection Time   10/13/11 12:08 PM      Component Value Range Status Comment   Specimen Description ABSCESS THIGH RIGHT   Final    Special Requests NONE   Final    Gram Stain     Final    Value: FEW  WBC PRESENT,BOTH PMN AND MONONUCLEAR     NO SQUAMOUS EPITHELIAL CELLS SEEN     NO ORGANISMS SEEN   Culture Culture reincubated for better growth   Final    Report Status PENDING   Incomplete   ANAEROBIC CULTURE     Status: Normal (Preliminary result)   Collection Time   10/13/11 12:08 PM      Component Value Range Status Comment   Specimen Description ABSCESS THIGH RIGHT   Final    Special Requests NONE   Final    Gram Stain     Final    Value: FEW WBC PRESENT,BOTH PMN AND MONONUCLEAR     NO SQUAMOUS EPITHELIAL CELLS SEEN     NO ORGANISMS SEEN   Culture     Final    Value: NO ANAEROBES ISOLATED; CULTURE IN PROGRESS FOR 5 DAYS   Report Status PENDING   Incomplete     Anti-infectives     Start     Dose/Rate Route Frequency Ordered Stop   10/14/11 2200   vancomycin (VANCOCIN) 750 mg in sodium chloride 0.9 % 150 mL IVPB  Status:  Discontinued        750  mg 150 mL/hr over 60 Minutes Intravenous Every 8 hours 10/14/11 1229 10/15/11 0858   10/13/11 1200   vancomycin (VANCOCIN) 2,000 mg in sodium chloride 0.9 % 500 mL IVPB  Status:  Discontinued        2,000 mg 250 mL/hr over 120 Minutes Intravenous Every 12 hours 10/13/11 0131 10/13/11 0136   10/13/11 1000   vancomycin (VANCOCIN) 2,000 mg in sodium chloride 0.9 % 500 mL IVPB  Status:  Discontinued        2,000 mg 250 mL/hr over 120 Minutes Intravenous Every 8 hours 10/13/11 0136 10/14/11 1229   10/13/11 0200   piperacillin-tazobactam (ZOSYN) IVPB 3.375 g  Status:  Discontinued        3.375 g 12.5 mL/hr over 240 Minutes Intravenous Every 8 hours 10/13/11 0125 10/13/11 1425   10/13/11 0200   vancomycin (VANCOCIN) 2,500 mg in sodium chloride 0.9 % 500 mL IVPB        2,500 mg 250 mL/hr over 120 Minutes Intravenous  Once 10/13/11 0125 10/13/11 0358   10/12/11 1930   clindamycin (CLEOCIN) IVPB 600 mg        600 mg 100 mL/hr over 30 Minutes Intravenous  Once 10/12/11 1923 10/12/11 2221          Assessment:40 y/o male obese patient  receiving day#4 of vancomycin therapy for a right thigh abscess/cellulitis that developed ARF. Remains afebrile, all cx ngtd. Obtained random vancomycin level =21.5 this morning. SCr slightly improved, urine output looks adequate.  Goal of Therapy:  Vancomycin trough 10-15  Plan:  Will continue to hold vancomycin and recheck random level in am 1.15. Will redose when level <15  [name], [name] 10/16/2011,10:00 AM

## 2011-10-16 NOTE — Progress Notes (Signed)
Will order wound care consult for negative pressure wound therapy (wound vac)  I saw the patient, participated in the history, exam and medical decision making, and concur with the physician assistant's note above.  Mary Sella. Andrey Campanile, MD, FACS General, Bariatric, & Minimally Invasive Surgery [facility], [place]

## 2011-10-17 LAB — CBC
HCT: 34.7 % — ABNORMAL LOW (ref 39.0–52.0)
Hemoglobin: 11.7 g/dL — ABNORMAL LOW (ref 13.0–17.0)
MCH: 27.2 pg (ref 26.0–34.0)
MCHC: 33.7 g/dL (ref 30.0–36.0)
MCV: 80.7 fL (ref 78.0–100.0)
RDW: 14 % (ref 11.5–15.5)

## 2011-10-17 LAB — GLUCOSE, CAPILLARY
Glucose-Capillary: 169 mg/dL — ABNORMAL HIGH (ref 70–99)
Glucose-Capillary: 169 mg/dL — ABNORMAL HIGH (ref 70–99)
Glucose-Capillary: 139 mg/dL — ABNORMAL HIGH (ref 70–99)
Glucose-Capillary: 172 mg/dL — ABNORMAL HIGH (ref 70–99)

## 2011-10-17 LAB — BASIC METABOLIC PANEL
BUN: 31 mg/dL — ABNORMAL HIGH (ref 6–23)
CO2: 23 mEq/L (ref 19–32)
Chloride: 106 mEq/L (ref 96–112)
Creatinine, Ser: 3.04 mg/dL — ABNORMAL HIGH (ref 0.50–1.35)
GFR calc Af Amer: 28 mL/min — ABNORMAL LOW (ref >90)
Glucose, Bld: 148 mg/dL — ABNORMAL HIGH (ref 70–99)
Potassium: 4.4 mEq/L (ref 3.5–5.1)

## 2011-10-17 MED ORDER — WARFARIN SODIUM 7.5 MG PO TABS
15.0000 mg | ORAL_TABLET | Freq: Once | ORAL | Status: AC
  Administered 2011-10-17: 15 mg via ORAL
  Filled 2011-10-17: qty 2

## 2011-10-17 MED ORDER — VANCOMYCIN HCL 1000 MG IV SOLR
2000.0000 mg | Freq: Once | INTRAVENOUS | Status: AC
  Administered 2011-10-17: 2000 mg via INTRAVENOUS
  Filled 2011-10-17: qty 2000

## 2011-10-17 NOTE — Progress Notes (Signed)
In case of Health changes or on dischargge please call his sister at this number; [phone]. Thanks ! Ancil Linsey RN

## 2011-10-17 NOTE — Progress Notes (Signed)
Pt will require negative pressure wound therapy @ home.  Pt requested referral to [facility] as he had home health services through that agency previously.   Liaison notified, [facility] will supply wound VAC and home health RN upon discharge.

## 2011-10-17 NOTE — Progress Notes (Signed)
Subjective: Still having pain on his right tight; no fever. Overall feeling better. VSS. CBG's improving.  Objective: Vital signs in last 24 hours: Temp:  [97.8 F (36.6 C)-98.1 F (36.7 C)] 97.9 F (36.6 C) (01/15 0500) Pulse Rate:  [86-92] 90  (01/15 0500) Resp:  [20] 20  (01/15 0500) BP: (101-139)/(63-90) 101/63 mmHg (01/15 0500) SpO2:  [93 %-94 %] 94 % (01/15 0500) Weight change:  Last BM Date: 10/14/11  Intake/Output from previous day: 01/14 0701 - 01/15 0700 In: -  Out: 1125 [Urine:1125] Total I/O In: 240 [P.O.:240] Out: -    Physical Exam: General: Morbidly Obese; alert, awake, oriented x3, in no acute distress. HEENT: No bruits, no goiter. Heart: Regular rate and rhythm, without murmurs, rubs, gallops. Lungs: Clear to auscultation bilaterally. Abdomen: Soft, nontender, nondistended, positive bowel sounds. Extremities: Rigth tight with open wound, no significant erythema around it; mild discharge. Also mild to moderate tenderness to palpation.  Neuro: Grossly intact, nonfocal.   Lab Results: Basic Metabolic Panel:  Basename 10/17/11 0635 10/16/11 0647  NA 140 133*  K 4.4 4.7  CL 106 101  CO2 23 22  GLUCOSE 148* 280*  BUN 31* 34*  CREATININE 3.04* 3.84*  CALCIUM 8.6 8.1*  MG -- --  PHOS -- --   CBC:  Basename 10/17/11 0635 10/16/11 0647  WBC 7.9 7.9  NEUTROABS -- --  HGB 11.7* 11.8*  HCT 34.7* 35.5*  MCV 80.7 81.4  PLT 204 201   CBG:  Basename 10/17/11 0732 10/16/11 2033 10/16/11 1654 10/16/11 1153 10/16/11 0721 10/15/11 2155  GLUCAP 169* 134* 163* 186* 254* 260*   Urine Drug Screen: Drugs of Abuse     Component Value Date/Time   LABOPIA POSITIVE* 04/30/2009 2256   COCAINSCRNUR NONE DETECTED 04/30/2009 2256   LABBENZ NONE DETECTED 04/30/2009 2256   AMPHETMU NONE DETECTED 04/30/2009 2256   THCU NONE DETECTED 04/30/2009 2256   LABBARB  Value: NONE DETECTED        DRUG SCREEN FOR MEDICAL PURPOSES ONLY.  IF CONFIRMATION IS NEEDED FOR ANY PURPOSE,  NOTIFY LAB WITHIN 5 DAYS.        LOWEST DETECTABLE LIMITS FOR URINE DRUG SCREEN Drug Class       Cutoff (ng/mL) Amphetamine      1000 Barbiturate      200 Benzodiazepine   200 Tricyclics       300 Opiates          300 Cocaine          300 THC              50 04/30/2009 2256   Misc. Labs:  Recent Results (from the past 240 hour(s))  CULTURE, ROUTINE-ABSCESS     Status: Normal   Collection Time   10/12/11  7:44 PM      Component Value Range Status Comment   Specimen Description ABSCESS THIGH RIGHT   Final    Special Requests NONE   Final    Gram Stain     Final    Value: PA WBC PRESENT, PREDOMINANTLY PMN     NO SQUAMOUS EPITHELIAL CELLS SEEN     RARE GRAM POSITIVE COCCI     IN PAIRS   Culture     Final    Value: FEW STREPTOCOCCUS SPECIES     Note: NON TYPEABLE BETA HEMOLYTIC   Report Status 10/16/2011 FINAL   Final   CULTURE, ROUTINE-ABSCESS     Status: Normal (Preliminary result)   Collection Time  10/13/11 12:08 PM      Component Value Range Status Comment   Specimen Description ABSCESS THIGH RIGHT   Final    Special Requests NONE   Final    Gram Stain     Final    Value: FEW WBC PRESENT,BOTH PMN AND MONONUCLEAR     NO SQUAMOUS EPITHELIAL CELLS SEEN     NO ORGANISMS SEEN   Culture     Final    Value: FEW STREPTOCOCCUS SPECIES     Note: NON TYPEBLE BETA HEMOLITIC   Report Status PENDING   Incomplete   ANAEROBIC CULTURE     Status: Normal (Preliminary result)   Collection Time   10/13/11 12:08 PM      Component Value Range Status Comment   Specimen Description ABSCESS THIGH RIGHT   Final    Special Requests NONE   Final    Gram Stain     Final    Value: FEW WBC PRESENT,BOTH PMN AND MONONUCLEAR     NO SQUAMOUS EPITHELIAL CELLS SEEN     NO ORGANISMS SEEN   Culture     Final    Value: NO ANAEROBES ISOLATED; CULTURE IN PROGRESS FOR 5 DAYS   Report Status PENDING   Incomplete     Studies/Results: No results found.  Medications: Scheduled Meds:   . amiodarone  200 mg Oral  Daily  . divalproex  1,000 mg Oral Daily  . insulin aspart  0-9 Units Subcutaneous TID WC  . insulin aspart  8 Units Subcutaneous TID WC  . insulin aspart protamine-insulin aspart  100 Units Subcutaneous Q breakfast  . insulin aspart protamine-insulin aspart  80 Units Subcutaneous Q supper  . oxyCODONE  15 mg Oral Q6H  . simvastatin  10 mg Oral q1800  . sodium chloride  3 mL Intravenous Q12H  . vancomycin  2,000 mg Intravenous Once   Continuous Infusions:   . sodium chloride 20 mL/hr at 10/16/11 1124   PRN Meds:.acetaminophen, acetaminophen, albuterol, HYDROmorphone, ondansetron (ZOFRAN) IV, ondansetron  Assessment/Plan: 1-Cellulitis and abscess of right thigh: Continue antibiotics; S/P I and D and per surgery the plan is for wound vac placement and wound care at this point. WBC's WNL, afebrile and with normal vital signs.  2-DIABETES MELLITUS, TYPE II: Continue SSI and lantus. CBG's a lot better with current regimen.  3-Morbid obesity: continue low calorie diet.  4-OBSTRUCTIVE SLEEP APNEA: stable; continue CPAP.  5-Atrial fibrillation: currently RRR. Continue Amiodarone.  6-CHF: Stable and compensated.  7-Acute renal failure: Improving. Cause for this problem most likely hypotension episode during I and D and also use of vancomycin. Pharmacy now adjusting and following vanc levels.  8-HLD: continue statins.    LOS: 5 days   Georgia Baria [facility] [phone]  10/17/2011, 12:02 PM

## 2011-10-17 NOTE — Consult Note (Signed)
WOC consult Note Reason for Consult: apply NPWT dressing to R thigh wound, S/P I and D of this area. Wound type:surgical wound Measurement:  11cm x 2cm x 5cm no tunneling or undermining noted Wound bed: pink and beefy red Drainage (amount, consistency, odor) large amounts serosanguinous  Periwound: intact but moist due to location Dressing procedure/placement/frequency: windowframed wound with hydrocolloid due to location to aid in seal.  1pc black foam cut to fit to wound bed, drape applied and seal obtained at HG, pt premedicated and tolerated without problems. Bedside nurses should be able to perform this VAC dressing change, uncomplicated application.  Will follow up with bedside nurses on Thurs for dressing change needs.   WOC team will follow up for dressing change assistance Thanks  Devyn Griffing [employer], CWOCN [phone])

## 2011-10-17 NOTE — Progress Notes (Signed)
4 Days Post-Op  Subjective: Pt feels ok. Not much pain from wound.  Objective: Vital signs in last 24 hours: Temp:  [97.8 F (36.6 C)-98.1 F (36.7 C)] 97.9 F (36.6 C) (01/15 0500) Pulse Rate:  [86-92] 90  (01/15 0500) Resp:  [20] 20  (01/15 0500) BP: (101-139)/(63-90) 101/63 mmHg (01/15 0500) SpO2:  [93 %-94 %] 94 % (01/15 0500) Last BM Date: 10/14/11  Intake/Output this shift: Total I/O In: 240 [P.O.:240] Out: -   Physical Exam: BP 101/63  Pulse 90  Temp(Src) 97.9 F (36.6 C) (Oral)  Resp 20  Ht 6\' 3"  (1.905 m)  Wt 203.121 kg (447 lb 12.8 oz)  BMI 55.97 kg/m2  SpO2 94% Wound very clean, beefy red tissue. Surrounding skin still pink, but not indurated.  Labs: CBC  Basename 10/17/11 0635 10/16/11 0647  WBC 7.9 7.9  HGB 11.7* 11.8*  HCT 34.7* 35.5*  PLT 204 201   BMET  Basename 10/17/11 0635 10/16/11 0647  NA 140 133*  K 4.4 4.7  CL 106 101  CO2 23 22  GLUCOSE 148* 280*  BUN 31* 34*  CREATININE 3.04* 3.84*  CALCIUM 8.6 8.1*   LFT No results found for this basename: PROT,ALBUMIN,AST,ALT,ALKPHOS,BILITOT,BILIDIR,IBILI,LIPASE in the last 72 hours PT/INR No results found for this basename: LABPROT:2,INR:2 in the last 72 hours ABG No results found for this basename: PHART:2,PCO2:2,PO2:2,HCO3:2 in the last 72 hours  Studies/Results: No results found.  Assessment: Principal Problem:  *Cellulitis of thigh Active Problems:  DIABETES MELLITUS, TYPE II  Morbid obesity  OBSTRUCTIVE SLEEP APNEA  Atrial fibrillation  CHF  Acute renal failure   Procedure(s): IRRIGATION AND DEBRIDEMENT ABSCESS  Plan: Wound looks ready for wound vac. Will order WOCN to see and place wound vac He should be able to get this at home, will ask Care mgmt to begin process.  LOS: 5 days    Marianna Fuss 10/17/2011

## 2011-10-17 NOTE — Progress Notes (Addendum)
ANTIBIOTIC CONSULT NOTE - FOLLOW UP  Pharmacy Consult for Vancomycin Indication: Right thigh cellulitis  Allergies  Allergen Reactions  . Shellfish Allergy Anaphylaxis  . Iodine     Patient Measurements: Height: 6\' 3"  (190.5 cm) Weight: 447 lb 12.8 oz (203.121 kg) IBW/kg (Calculated) : 84.5   Vital Signs: Temp: 97.9 F (36.6 C) (01/15 0500) Temp src: Oral (01/15 0500) BP: 101/63 mmHg (01/15 0500) Pulse Rate: 90  (01/15 0500) Intake/Output from previous day: 01/14 0701 - 01/15 0700 In: -  Out: 1125 [Urine:1125] Intake/Output from this shift: Total I/O In: 240 [P.O.:240] Out: -   Labs:  Basename 10/17/11 0635 10/16/11 0647 10/15/11 0600  WBC 7.9 7.9 10.9*  HGB 11.7* 11.8* 12.3*  PLT 204 201 204  LABCREA -- -- --  CREATININE 3.04* 3.84* 4.08*   Estimated Creatinine Clearance: 59.7 ml/min (by C-G formula based on Cr of 3.04).  Basename 10/17/11 0635 10/16/11 0647 10/15/11 0935  VANCOTROUGH -- 21.5* --  VANCOPEAK -- -- --  VANCORANDOM 13.6 -- 35.4  GENTTROUGH -- -- --  GENTPEAK -- -- --  GENTRANDOM -- -- --  TOBRATROUGH -- -- --  TOBRAPEAK -- -- --  TOBRARND -- -- --  AMIKACINPEAK -- -- --  AMIKACINTROU -- -- --  AMIKACIN -- -- --     Microbiology: Recent Results (from the past 720 hour(s))  CULTURE, ROUTINE-ABSCESS     Status: Normal   Collection Time   10/12/11  7:44 PM      Component Value Range Status Comment   Specimen Description ABSCESS THIGH RIGHT   Final    Special Requests NONE   Final    Gram Stain     Final    Value: PA WBC PRESENT, PREDOMINANTLY PMN     NO SQUAMOUS EPITHELIAL CELLS SEEN     RARE GRAM POSITIVE COCCI     IN PAIRS   Culture     Final    Value: FEW STREPTOCOCCUS SPECIES     Note: NON TYPEABLE BETA HEMOLYTIC   Report Status 10/16/2011 FINAL   Final   CULTURE, ROUTINE-ABSCESS     Status: Normal (Preliminary result)   Collection Time   10/13/11 12:08 PM      Component Value Range Status Comment   Specimen Description  ABSCESS THIGH RIGHT   Final    Special Requests NONE   Final    Gram Stain     Final    Value: FEW WBC PRESENT,BOTH PMN AND MONONUCLEAR     NO SQUAMOUS EPITHELIAL CELLS SEEN     NO ORGANISMS SEEN   Culture     Final    Value: FEW STREPTOCOCCUS SPECIES     Note: NON TYPEBLE BETA HEMOLITIC   Report Status PENDING   Incomplete   ANAEROBIC CULTURE     Status: Normal (Preliminary result)   Collection Time   10/13/11 12:08 PM      Component Value Range Status Comment   Specimen Description ABSCESS THIGH RIGHT   Final    Special Requests NONE   Final    Gram Stain     Final    Value: FEW WBC PRESENT,BOTH PMN AND MONONUCLEAR     NO SQUAMOUS EPITHELIAL CELLS SEEN     NO ORGANISMS SEEN   Culture     Final    Value: NO ANAEROBES ISOLATED; CULTURE IN PROGRESS FOR 5 DAYS   Report Status PENDING   Incomplete     Anti-infectives  Start     Dose/Rate Route Frequency Ordered Stop   10/14/11 2200   vancomycin (VANCOCIN) 750 mg in sodium chloride 0.9 % 150 mL IVPB  Status:  Discontinued        750 mg 150 mL/hr over 60 Minutes Intravenous Every 8 hours 10/14/11 1229 10/15/11 0858   10/13/11 1200   vancomycin (VANCOCIN) 2,000 mg in sodium chloride 0.9 % 500 mL IVPB  Status:  Discontinued        2,000 mg 250 mL/hr over 120 Minutes Intravenous Every 12 hours 10/13/11 0131 10/13/11 0136   10/13/11 1000   vancomycin (VANCOCIN) 2,000 mg in sodium chloride 0.9 % 500 mL IVPB  Status:  Discontinued        2,000 mg 250 mL/hr over 120 Minutes Intravenous Every 8 hours 10/13/11 0136 10/14/11 1229   10/13/11 0200   piperacillin-tazobactam (ZOSYN) IVPB 3.375 g  Status:  Discontinued        3.375 g 12.5 mL/hr over 240 Minutes Intravenous Every 8 hours 10/13/11 0125 10/13/11 1425   10/13/11 0200   vancomycin (VANCOCIN) 2,500 mg in sodium chloride 0.9 % 500 mL IVPB        2,500 mg 250 mL/hr over 120 Minutes Intravenous  Once 10/13/11 0125 10/13/11 0358   10/12/11 1930   clindamycin (CLEOCIN) IVPB 600 mg         600 mg 100 mL/hr over 30 Minutes Intravenous  Once 10/12/11 1923 10/12/11 2221          Assessment: 41 y/o male patient receiving day #5 vancomycin for right thigh abscess/cellulitis, subsequentally developed ARF. Vancomycin has been held for 2 days now since patient became toxic on abx. Random level today =13.6, renal fxn continues to improve. Ok to redose today. Will probably require dose every 48 hours. Remains afebrile, thigh cx reveals strep, may be able to de-escalate therapy to rocephin.   Goal of Therapy:  Vancomycin trough level 10-15 mcg/ml  Plan:  Give vancomycin 2g iv x1 today and check random vanc level in 48hrs. Will continue to follow. Measure antibiotic drug levels at steady state Follow up culture results  Verlene Mayer, PharmD, BCPS Pager [phone] 10/17/2011,11:36 AM   Resuming coumadin for h/o afib. Patient takes coumadin 10mg  daily except 15mg  mwf, admit with subtherapeutic INR. Noted drug interaction with amiodarone. Will give coumadin 15mg  today and f/u daily protime.

## 2011-10-18 LAB — CBC
HCT: 35.7 % — ABNORMAL LOW (ref 39.0–52.0)
Hemoglobin: 12 g/dL — ABNORMAL LOW (ref 13.0–17.0)
MCV: 81.7 fL (ref 78.0–100.0)
RBC: 4.37 MIL/uL (ref 4.22–5.81)
WBC: 9.8 10*3/uL (ref 4.0–10.5)

## 2011-10-18 LAB — BASIC METABOLIC PANEL
CO2: 26 mEq/L (ref 19–32)
Chloride: 107 mEq/L (ref 96–112)
Creatinine, Ser: 2.73 mg/dL — ABNORMAL HIGH (ref 0.50–1.35)
Potassium: 4.8 mEq/L (ref 3.5–5.1)
Sodium: 141 mEq/L (ref 135–145)

## 2011-10-18 LAB — GLUCOSE, CAPILLARY
Glucose-Capillary: 139 mg/dL — ABNORMAL HIGH (ref 70–99)
Glucose-Capillary: 184 mg/dL — ABNORMAL HIGH (ref 70–99)
Glucose-Capillary: 77 mg/dL (ref 70–99)

## 2011-10-18 MED ORDER — WARFARIN SODIUM 7.5 MG PO TABS
15.0000 mg | ORAL_TABLET | Freq: Once | ORAL | Status: AC
  Administered 2011-10-18: 15 mg via ORAL
  Filled 2011-10-18: qty 2

## 2011-10-18 NOTE — Progress Notes (Signed)
Subjective: Still with some pain on his right tight; no fever. Overall feeling better. Wound vac in place.  Objective: Vital signs in last 24 hours: Temp:  [97.5 F (36.4 C)-97.9 F (36.6 C)] 97.9 F (36.6 C) (01/16 1400) Pulse Rate:  [76-90] 79  (01/16 1400) Resp:  [20] 20  (01/16 1400) BP: (115-163)/(73-77) 146/75 mmHg (01/16 1400) SpO2:  [93 %-98 %] 93 % (01/16 1400) Weight change:  Last BM Date: 10/14/11  Intake/Output from previous day: 01/15 0701 - 01/16 0700 In: 840 [P.O.:600; I.V.:240] Out: 950 [Urine:950]     Physical Exam: General: Morbidly Obese; alert, awake, oriented x3, in no acute distress. HEENT: No bruits, no goiter. Heart: Regular rate and rhythm, without murmurs, rubs, gallops. Lungs: Clear to auscultation bilaterally. Abdomen: Soft, nontender, nondistended, positive bowel sounds. Extremities: Rigth tight with open wound, no significant erythema around it; mild discharge. Also mild to moderate tenderness to palpation.  Neuro: Grossly intact, nonfocal.   Lab Results: Basic Metabolic Panel:  Basename 10/18/11 0550 10/17/11 0635  NA 141 140  K 4.8 4.4  CL 107 106  CO2 26 23  GLUCOSE 135* 148*  BUN 27* 31*  CREATININE 2.73* 3.04*  CALCIUM 9.2 8.6  MG -- --  PHOS -- --   CBC:  Basename 10/18/11 0550 10/17/11 0635  WBC 9.8 7.9  NEUTROABS -- --  HGB 12.0* 11.7*  HCT 35.7* 34.7*  MCV 81.7 80.7  PLT 220 204   CBG:  Basename 10/18/11 1642 10/18/11 1136 10/18/11 0725 10/17/11 2032 10/17/11 1645 10/17/11 1140  GLUCAP 77 184* 139* 172* 139* 169*   Urine Drug Screen: Drugs of Abuse     Component Value Date/Time   LABOPIA POSITIVE* 04/30/2009 2256   COCAINSCRNUR NONE DETECTED 04/30/2009 2256   LABBENZ NONE DETECTED 04/30/2009 2256   AMPHETMU NONE DETECTED 04/30/2009 2256   THCU NONE DETECTED 04/30/2009 2256   LABBARB  Value: NONE DETECTED        DRUG SCREEN FOR MEDICAL PURPOSES ONLY.  IF CONFIRMATION IS NEEDED FOR ANY PURPOSE, NOTIFY LAB WITHIN 5  DAYS.        LOWEST DETECTABLE LIMITS FOR URINE DRUG SCREEN Drug Class       Cutoff (ng/mL) Amphetamine      1000 Barbiturate      200 Benzodiazepine   200 Tricyclics       300 Opiates          300 Cocaine          300 THC              50 04/30/2009 2256   Misc. Labs:  Recent Results (from the past 240 hour(s))  CULTURE, ROUTINE-ABSCESS     Status: Normal   Collection Time   10/12/11  7:44 PM      Component Value Range Status Comment   Specimen Description ABSCESS THIGH RIGHT   Final    Special Requests NONE   Final    Gram Stain     Final    Value: PA WBC PRESENT, PREDOMINANTLY PMN     NO SQUAMOUS EPITHELIAL CELLS SEEN     RARE GRAM POSITIVE COCCI     IN PAIRS   Culture     Final    Value: FEW STREPTOCOCCUS SPECIES     Note: NON TYPEABLE BETA HEMOLYTIC   Report Status 10/16/2011 FINAL   Final   CULTURE, ROUTINE-ABSCESS     Status: Normal   Collection Time   10/13/11 12:08 PM  Component Value Range Status Comment   Specimen Description ABSCESS THIGH RIGHT   Final    Special Requests NONE   Final    Gram Stain     Final    Value: FEW WBC PRESENT,BOTH PMN AND MONONUCLEAR     NO SQUAMOUS EPITHELIAL CELLS SEEN     NO ORGANISMS SEEN   Culture     Final    Value: FEW STREPTOCOCCUS SPECIES     Note: NON TYPEBLE BETA HEMOLITIC     STREPTOCOCCUS BOVIS   Report Status 10/18/2011 FINAL   Final   ANAEROBIC CULTURE     Status: Normal   Collection Time   10/13/11 12:08 PM      Component Value Range Status Comment   Specimen Description ABSCESS THIGH RIGHT   Final    Special Requests NONE   Final    Gram Stain     Final    Value: FEW WBC PRESENT,BOTH PMN AND MONONUCLEAR     NO SQUAMOUS EPITHELIAL CELLS SEEN     NO ORGANISMS SEEN   Culture NO ANAEROBES ISOLATED   Final    Report Status 10/18/2011 FINAL   Final     Studies/Results: No results found.  Medications: Scheduled Meds:    . amiodarone  200 mg Oral Daily  . divalproex  1,000 mg Oral Daily  . insulin aspart  0-9 Units  Subcutaneous TID WC  . insulin aspart  8 Units Subcutaneous TID WC  . insulin aspart protamine-insulin aspart  100 Units Subcutaneous Q breakfast  . insulin aspart protamine-insulin aspart  80 Units Subcutaneous Q supper  . oxyCODONE  15 mg Oral Q6H  . simvastatin  10 mg Oral q1800  . sodium chloride  3 mL Intravenous Q12H  . warfarin  15 mg Oral ONCE-1800   Continuous Infusions:    . sodium chloride 20 mL/hr at 10/16/11 1124   PRN Meds:.acetaminophen, acetaminophen, albuterol, HYDROmorphone, ondansetron (ZOFRAN) IV, ondansetron  Assessment/Plan: 1-Cellulitis and abscess of right thigh: Continue antibiotics; S/P I and D; doing ok in general; will continue wound vac and wound care. Most likely home tomorrow if renal function continue improving.  2-DIABETES MELLITUS, TYPE II: Continue SSI and lantus. CBG's stable.  3-Morbid obesity: continue low calorie diet. As soon as possible increase activity and exercise.  4-OBSTRUCTIVE SLEEP APNEA: stable; continue CPAP at bedtime.Marland Kitchen  5-Atrial fibrillation: currently RRR. Continue Amiodarone and coumadin now.  6-CHF: Stable and compensated.  7-Acute renal failure: Cr. 2.73 today. Will follow tomorrow on his levels; continue improving.  8-HLD: continue statins.    LOS: 6 days   Dionne Rossa [facility] [phone]  10/18/2011, 7:10 PM

## 2011-10-18 NOTE — Progress Notes (Signed)
I saw the patient, participated in the history, exam and medical decision making, and concur with the physician assistant's note above.  Meriem Lemieux M. Taim Wurm, MD, FACS General, Bariatric, & Minimally Invasive Surgery [facility]   

## 2011-10-18 NOTE — Progress Notes (Signed)
ANTICOAGULATION CONSULT NOTE - Follow Up Consult  Pharmacy Consult for Coumadin Indication: afib  Allergies  Allergen Reactions  . Shellfish Allergy Anaphylaxis  . Iodine     Patient Measurements: Height: 6\' 3"  (190.5 cm) Weight: 447 lb 12.8 oz (203.121 kg) IBW/kg (Calculated) : 84.5   Vital Signs: Temp: 97.5 F (36.4 C) (01/16 0500) Temp src: Oral (01/16 0500) BP: 115/73 mmHg (01/16 0500) Pulse Rate: 76  (01/16 0500)  Labs:  Basename 10/18/11 0550 10/17/11 0635 10/16/11 0647  HGB 12.0* 11.7* --  HCT 35.7* 34.7* 35.5*  PLT 220 204 201  APTT -- -- --  LABPROT 15.4* -- --  INR 1.19 -- --  HEPARINUNFRC -- -- --  CREATININE 2.73* 3.04* 3.84*  CKTOTAL -- -- --  CKMB -- -- --  TROPONINI -- -- --   Estimated Creatinine Clearance: 66.4 ml/min (by C-G formula based on Cr of 2.73).   Medications:  Scheduled:    . amiodarone  200 mg Oral Daily  . divalproex  1,000 mg Oral Daily  . insulin aspart  0-9 Units Subcutaneous TID WC  . insulin aspart  8 Units Subcutaneous TID WC  . insulin aspart protamine-insulin aspart  100 Units Subcutaneous Q breakfast  . insulin aspart protamine-insulin aspart  80 Units Subcutaneous Q supper  . oxyCODONE  15 mg Oral Q6H  . simvastatin  10 mg Oral q1800  . sodium chloride  3 mL Intravenous Q12H  . vancomycin  2,000 mg Intravenous Once  . warfarin  15 mg Oral ONCE-1800    Assessment: 41 y/o male patient receiving chronic coumadin for h/o afib, restarted yesterday after I&D. No bleeding reported. Anticipate INR subtherapeutic, anticipate INR increase tomorrow.  Goal of Therapy:  INR 2-3   Plan:  Repeat coumadin 15mg  today and f/u in am.  Verlene Mayer, PharmD, BCPS Pager [phone] 10/18/2011,10:59 AM

## 2011-10-18 NOTE — Progress Notes (Signed)
I saw the patient, participated in the history, exam and medical decision making, and concur with the physician assistant's note above.  Kadisha Goodine M. Robi Mitter, MD, FACS General, Bariatric, & Minimally Invasive Surgery [facility]   

## 2011-10-18 NOTE — Progress Notes (Signed)
5 Days Post-Op  Subjective: Pt ok. C/o pain in (R)great toe, does report hx of gout in the past. Tolerating Vac to wd so far  Objective: Vital signs in last 24 hours: Temp:  [97.5 F (36.4 C)-97.9 F (36.6 C)] 97.5 F (36.4 C) (01/16 0500) Pulse Rate:  [76-91] 76  (01/16 0500) Resp:  [16-20] 20  (01/16 0500) BP: (115-163)/(73-88) 115/73 mmHg (01/16 0500) SpO2:  [92 %-98 %] 98 % (01/16 0500) Last BM Date: 10/14/11  Intake/Output this shift:    Physical Exam: BP 115/73  Pulse 76  Temp(Src) 97.5 F (36.4 C) (Oral)  Resp 20  Ht 6\' 3"  (1.905 m)  Wt 203.121 kg (447 lb 12.8 oz)  BMI 55.97 kg/m2  SpO2 98% (R)medial thigh, no induration. Vac intact, SS output in canister  Labs: CBC  Basename 10/18/11 0550 10/17/11 0635  WBC 9.8 7.9  HGB 12.0* 11.7*  HCT 35.7* 34.7*  PLT 220 204   BMET  Basename 10/18/11 0550 10/17/11 0635  NA 141 140  K 4.8 4.4  CL 107 106  CO2 26 23  GLUCOSE 135* 148*  BUN 27* 31*  CREATININE 2.73* 3.04*  CALCIUM 9.2 8.6   LFT No results found for this basename: PROT,ALBUMIN,AST,ALT,ALKPHOS,BILITOT,BILIDIR,IBILI,LIPASE in the last 72 hours PT/INR  Basename 10/18/11 0550  LABPROT 15.4*  INR 1.19   ABG No results found for this basename: PHART:2,PCO2:2,PO2:2,HCO3:2 in the last 72 hours  Studies/Results: No results found.  Assessment: Principal Problem:  *Cellulitis of thigh Active Problems:  DIABETES MELLITUS, TYPE II  Morbid obesity  OBSTRUCTIVE SLEEP APNEA  Atrial fibrillation  CHF  Acute renal failure   Procedure(s): IRRIGATION AND DEBRIDEMENT ABSCESS  Plan: Continue wd vac, plan for home wd vac, arrangements made. OK to restart Coumadin DC when medically stable. He says he has transportation for outpt follow-up.  LOS: 6 days    Marianna Fuss 10/18/2011

## 2011-10-19 LAB — CBC
MCHC: 33 g/dL (ref 30.0–36.0)
Platelets: 239 10*3/uL (ref 150–400)
RDW: 14.2 % (ref 11.5–15.5)
WBC: 10.9 10*3/uL — ABNORMAL HIGH (ref 4.0–10.5)

## 2011-10-19 LAB — VANCOMYCIN, RANDOM: Vancomycin Rm: 15.7 ug/mL

## 2011-10-19 LAB — GLUCOSE, CAPILLARY: Glucose-Capillary: 74 mg/dL (ref 70–99)

## 2011-10-19 LAB — BASIC METABOLIC PANEL
BUN: 24 mg/dL — ABNORMAL HIGH (ref 6–23)
Calcium: 9.1 mg/dL (ref 8.4–10.5)
Creatinine, Ser: 2.54 mg/dL — ABNORMAL HIGH (ref 0.50–1.35)
GFR calc Af Amer: 34 mL/min — ABNORMAL LOW (ref >90)

## 2011-10-19 LAB — PROTIME-INR: INR: 1.25 (ref 0.00–1.49)

## 2011-10-19 MED ORDER — INSULIN ASPART PROT & ASPART (70-30 MIX) 100 UNIT/ML ~~LOC~~ SUSP: 70.0000 [IU] | Freq: Two times a day (BID) | SUBCUTANEOUS | Status: DC

## 2011-10-19 MED ORDER — LISINOPRIL 20 MG PO TABS: 20.0000 mg | ORAL_TABLET | Freq: Every day | ORAL | Status: DC

## 2011-10-19 MED ORDER — DOXYCYCLINE HYCLATE 50 MG PO CAPS: 50.0000 mg | ORAL_CAPSULE | Freq: Two times a day (BID) | ORAL | Status: AC

## 2011-10-19 MED ORDER — AMIODARONE HCL 200 MG PO TABS: 200.0000 mg | ORAL_TABLET | Freq: Every day | ORAL | Status: DC

## 2011-10-19 MED ORDER — METOPROLOL SUCCINATE ER 50 MG PO TB24: 50.0000 mg | ORAL_TABLET | Freq: Every day | ORAL | Status: DC

## 2011-10-19 MED ORDER — OXYCODONE HCL 15 MG PO TABS: 15.0000 mg | ORAL_TABLET | Freq: Four times a day (QID) | ORAL | Status: AC

## 2011-10-19 MED ORDER — INSULIN ASPART PROT & ASPART (70-30 MIX) 100 UNIT/ML ~~LOC~~ SUSP
70.0000 [IU] | Freq: Once | SUBCUTANEOUS | Status: AC
  Administered 2011-10-19: 70 [IU] via SUBCUTANEOUS

## 2011-10-19 MED ORDER — FUROSEMIDE 40 MG PO TABS: 40.0000 mg | ORAL_TABLET | Freq: Two times a day (BID) | ORAL | Status: DC

## 2011-10-19 NOTE — Progress Notes (Signed)
6 Days Post-Op  Subjective: Receiving bed bath currently.  Vac in place no significant complaints, He is scheduled for D/c today    Objective: Vital signs in last 24 hours: Temp:  [97.8 F (36.6 C)-98 F (36.7 C)] 97.8 F (36.6 C) (01/17 0500) Pulse Rate:  [79-95] 90  (01/17 0500) Resp:  [20] 20  (01/17 0500) BP: (130-146)/(74-83) 130/74 mmHg (01/17 0500) SpO2:  [93 %-96 %] 95 % (01/17 0500) Last BM Date: 10/14/11  Intake/Output this shift: Total I/O In: 240 [P.O.:240] Out: 700 [Urine:700]  Physical Exam: BP 130/74  Pulse 90  Temp(Src) 97.8 F (36.6 C) (Oral)  Resp 20  Ht 6\' 3"  (1.905 m)  Wt 203.121 kg (447 lb 12.8 oz)  BMI 55.97 kg/m2  SpO2 95% (R)medial thigh, no induration. Vac intact, SS output in canister  Labs: CBC  Basename 10/19/11 0550 10/18/11 0550  WBC 10.9* 9.8  HGB 11.7* 12.0*  HCT 35.5* 35.7*  PLT 239 220   BMET  Basename 10/19/11 0550 10/18/11 0550  NA 146* 141  K 4.8 4.8  CL 111 107  CO2 28 26  GLUCOSE 74 135*  BUN 24* 27*  CREATININE 2.54* 2.73*  CALCIUM 9.1 9.2   LFT No results found for this basename: PROT,ALBUMIN,AST,ALT,ALKPHOS,BILITOT,BILIDIR,IBILI,LIPASE in the last 72 hours PT/INR  Basename 10/19/11 0550 10/18/11 0550  LABPROT 16.0* 15.4*  INR 1.25 1.19   ABG No results found for this basename: PHART:2,PCO2:2,PO2:2,HCO3:2 in the last 72 hours  Studies/Results: No results found.  Assessment: Principal Problem:  *Cellulitis of thigh Active Problems:  DIABETES MELLITUS, TYPE II  Morbid obesity  OBSTRUCTIVE SLEEP APNEA  Atrial fibrillation  CHF  Acute renal failure   Procedure(s): IRRIGATION AND DEBRIDEMENT ABSCESS  Plan: Continue wd vac, plan for home wd vac, arrangements made. Home health arrangements and follow up instructions with DR. Wakefield in place. For d/c home today.                            LOS: 7 days    [name] 10/19/2011

## 2011-10-19 NOTE — Progress Notes (Signed)
Molleigh Huot M. Josselin Gaulin, MD, FACS General, Bariatric, & Minimally Invasive Surgery [facility]  

## 2011-10-19 NOTE — Progress Notes (Signed)
   CARE MANAGEMENT NOTE 10/19/2011  Patient:  [name], [name]   Account Number:  [id number]  Date Initiated:  10/13/2011  Documentation initiated by:  Junius Creamer  Subjective/Objective Assessment:   adm w cellulitis of thigh     Action/Plan:   lives alone, pcp dr Clydie Braun schooler   Anticipated DC Date:  10/19/2011   Anticipated DC Plan:  HOME W HOME HEALTH SERVICES      DC Planning Services  CM consult      [facility] Choice  HOME HEALTH  DURABLE MEDICAL EQUIPMENT   Choice offered to / List presented to:  C-1 Patient   DME arranged  [facility]      DME agency  [facility]     [facility] arranged  HH-1 RN      [facility] agency  [facility]   Status of service:   Medicare Important Message given?   (If response is "NO", the following Medicare IM given date fields will be blank) Date Medicare IM given:   Date Additional Medicare IM given:    Discharge Disposition:  HOME W HOME HEALTH SERVICES  Per UR Regulation:  Reviewed for med. necessity/level of care/duration of stay  Comments:  1/17 have alerted [facility] of dc for today, wet to dry dssg and [facility] will apply vac when gets home, debbie Ethelbert Thain rn,bsn [phone] 10/17/11 1200 Henrietta Mayo RN MSN CCM Pt will require negative pressure wound therapy @ home. Per pt, he had home health services from [facility] previously and wants referral to that agency for the machine and nursing services.  Form to complete for NPWT in shadow chart, referral to [facility] liaison.  1/11 ur ins reivew, debbie Carlia Bomkamp rn,bsn [phone]

## 2011-10-19 NOTE — Discharge Summary (Signed)
Physician Discharge Summary  Patient ID: [name] MRN: [id number] DOB/AGE: [birth year] 41 y.o.  Admit date: 10/12/2011 Discharge date: 10/19/2011  Primary Care Physician:  Emeterio Reeve, MD, MD   Discharge Diagnoses:   .Cellulitis and abscess of right thigh .DIABETES MELLITUS, TYPE II .Morbid obesity .OBSTRUCTIVE SLEEP APNEA .Atrial fibrillation .CHF  Current Discharge Medication List    START taking these medications   Details  doxycycline (VIBRAMYCIN) 50 MG capsule Take 1 capsule (50 mg total) by mouth 2 (two) times daily. Qty: 20 capsule, Refills: 0      CONTINUE these medications which have CHANGED   Details  !! amiodarone (PACERONE) 200 MG tablet Take 1 tablet (200 mg total) by mouth daily. Qty: 30 tablet, Refills: 1    furosemide (LASIX) 40 MG tablet Take 1 tablet (40 mg total) by mouth 2 (two) times daily. Qty: 30 tablet    insulin aspart protamine-insulin aspart (NOVOLOG 70/30) (70-30) 100 UNIT/ML injection Inject 70-90 Units into the skin 2 (two) times daily with a meal. 95 units in the morning; 75 units in the evening Qty: 10 mL    lisinopril (PRINIVIL,ZESTRIL) 20 MG tablet Take 1 tablet (20 mg total) by mouth daily.    !! metoprolol succinate (TOPROL-XL) 50 MG 24 hr tablet Take 1 tablet (50 mg total) by mouth daily. Qty: 30 tablet, Refills: 1    !! oxyCODONE (ROXICODONE) 15 MG immediate release tablet Take 1 tablet (15 mg total) by mouth every 6 (six) hours. Qty: 45 tablet, Refills: 0     !! - Potential duplicate medications found. Please discuss with provider.    CONTINUE these medications which have NOT CHANGED   Details  albuterol (VENTOLIN HFA) 108 (90 BASE) MCG/ACT inhaler Inhale 2 puffs into the lungs every 6 (six) hours as needed. Shortness of breath    !! amiodarone (PACERONE) 200 MG tablet Take 200 mg by mouth daily.    digoxin (LANOXIN) 0.25 MG tablet Take 250 mcg by mouth daily.    divalproex (DEPAKOTE) 500 MG DR tablet Take 1,000 mg by  mouth daily.    !! metoprolol (TOPROL-XL) 50 MG 24 hr tablet Take 50 mg by mouth daily.    !! oxyCODONE (ROXICODONE) 15 MG immediate release tablet Take 15 mg by mouth every 6 (six) hours. For pain    pravastatin (PRAVACHOL) 20 MG tablet Take 40 mg by mouth at bedtime.     warfarin (COUMADIN) 10 MG tablet Take 10-15 mg by mouth daily. Monday, Wednesday, Friday 15mg ; Sunday, Tuesday, Thursday, Saturday 10mg      !! - Potential duplicate medications found. Please discuss with provider.    STOP taking these medications     ibuprofen (ADVIL,MOTRIN) 200 MG tablet        Disposition and Follow-up:  Patient discharge in stable and improved condition; CBG's well controlled and wound healing properly. He will follow at General surgeon office in 2 weeks; will finish her antibiotics as indicated and will received home outpatient wound care through [facility]. Patient will also follow with PCP for further adjustments on his chronic medication and to make sure renal function has returned to normal. Lasix and Lisinopril on hold until kidney function normal again; PCP to follow BMET during visit and resumed them when appropriate.  Consults:  General Surgery.   Significant Diagnostic Studies:  No results found.  Brief H and P: 41 year-old male with history of diabetes mellitus type 2, atrial fibrillation, hypertension, morbid obesity, OSA had gotten his PCP today because  of worsening swelling in his right thigh with pain and discharge. He noticed a small lesion 3 weeks ago on his medial aspect of his right thigh which gradually worsened in the last 48 hours was causing pain and discharge. His PCP referred him to the ER. Surgeon on call Dr.Cornett has already evaluated the patient and is planning to take him to the OR tomorrow for debridement. Hospitalist has been requested admission. Patient has had some subjective feeling of fever and chills. Denies any nausea vomiting abdominal pain chest pain shortness  of breath.   Hospital Course:  1-Cellulitis and abscess of right thigh:Improving. At this point will continue doxycycline for 10 more days, Follow up with general surgeon in 2 weeks and home nurse/wound vac as an outpatient. Pain meds as needed.  2-DIABETES MELLITUS, TYPE II: Patient will go home with adjusted 70/30 insulin regimen and will follow with PCP for further adjustments.  3-Morbid obesity: advised to follow a low calorie diet.  4-OBSTRUCTIVE SLEEP APNEA: patient will continue CPAP.  5-Atrial fibrillation: continue digoxin, metoprolol and coumadin.  6-CHF: will continue B-blocker; hold off lasix for now; patient advised to follow low sodium diet. Currently compensated. ACE-inhibitor held due ARF.  7-Acute renal failure: Due to vancomycin; nephrotoxic agents use and hypotension episode during surgery. Now improving. Will hold off nephrotoxic agents and will ask patient to keep himself hydrated and to follow with PCP.  Rest of medical problems stable and no changes have been made to his medications; he will continue current regimen and will follow with PCP for further adjustments.  Time spent on Discharge: 50 minutes  Signed: Monya Kozakiewicz 10/19/2011, 8:41 AM

## 2011-10-19 NOTE — Progress Notes (Signed)
PT CBG=74. DR MADERA NOTIFIED ORDER RECEIVED TO HOLD SLIDING SCALE AND MEAL COVERAGE AND TO ONLY GIVE 70 UNITS OF NOVOLOG 70/30.

## 2011-10-24 ENCOUNTER — Ambulatory Visit (INDEPENDENT_AMBULATORY_CARE_PROVIDER_SITE_OTHER): Payer: 59 | Admitting: General Surgery

## 2011-10-24 ENCOUNTER — Encounter (INDEPENDENT_AMBULATORY_CARE_PROVIDER_SITE_OTHER): Payer: Self-pay | Admitting: General Surgery

## 2011-10-24 ENCOUNTER — Other Ambulatory Visit: Payer: Self-pay | Admitting: Cardiology

## 2011-10-24 VITALS — BP 154/82 | HR 84 | Temp 97.8°F | Resp 20 | Ht 75.0 in | Wt >= 6400 oz

## 2011-10-24 DIAGNOSIS — Z09 Encounter for follow-up examination after completed treatment for conditions other than malignant neoplasm: Secondary | ICD-10-CM

## 2011-10-24 MED ORDER — DIGOXIN 250 MCG PO TABS: 250.0000 ug | ORAL_TABLET | Freq: Every day | ORAL | Status: DC

## 2011-10-24 NOTE — Progress Notes (Signed)
Subjective:     Patient ID: [name], male   DOB: 18-Nov-1970, 41 y.o.   MRN: [id number]  HPI This is a 41 year old male who had a very large right inner thigh abscess. I took him to the operating room incised, drained, and debrided this abscess. He has had a VAC device on this since then. He comes in today without any real complaints. He states this is getting better. He does not really have this applies to change his VAC in the office today so am not going to do that today.  Review of Systems     Objective:   Physical Exam Right inner thigh vac functional with surrounding area without erythema    Assessment:     S/p i and d right inner thigh abscess    Plan:     I will need to see him back with his supplies so we can look at this wound.

## 2011-10-24 NOTE — Patient Instructions (Signed)
Return in 2-3 weeks

## 2011-10-25 ENCOUNTER — Encounter: Payer: 59 | Admitting: *Deleted

## 2011-10-26 ENCOUNTER — Encounter: Payer: 59 | Admitting: *Deleted

## 2011-11-06 ENCOUNTER — Telehealth: Payer: Self-pay | Admitting: Cardiology

## 2011-11-06 NOTE — Telephone Encounter (Signed)
Pt b.p is elevated today it is 168/104 Pt has not filled Norvac Rx yet because he doesn't have the money to get it filled and he will try to do it today or tomorrow

## 2011-11-06 NOTE — Telephone Encounter (Signed)
Spoke with home health, the pt is trying to get his meds.

## 2011-11-16 ENCOUNTER — Encounter (INDEPENDENT_AMBULATORY_CARE_PROVIDER_SITE_OTHER): Payer: Self-pay | Admitting: General Surgery

## 2011-11-16 ENCOUNTER — Ambulatory Visit (INDEPENDENT_AMBULATORY_CARE_PROVIDER_SITE_OTHER): Payer: 59 | Admitting: General Surgery

## 2011-11-16 VITALS — BP 182/98 | HR 88 | Temp 97.7°F | Resp 18 | Ht 76.0 in | Wt >= 6400 oz

## 2011-11-16 DIAGNOSIS — Z09 Encounter for follow-up examination after completed treatment for conditions other than malignant neoplasm: Secondary | ICD-10-CM

## 2011-11-16 NOTE — Progress Notes (Signed)
Subjective:     Patient ID: [name], male   DOB: 1971-06-21, 41 y.o.   MRN: [id number]  HPI  48 yom s/p debridement of left thigh abscess.  He returns today without complaint.  It is nearly healed. Review of Systems     Objective:   Physical Exam Healing left thigh wound with just small superficial opening, no infection    Assessment:     S/p debridement left thigh wound    Plan:     This should completely heal in next week or so.  I asked him to call back if he needs anything further.

## 2011-12-02 ENCOUNTER — Other Ambulatory Visit: Payer: Self-pay | Admitting: Cardiology

## 2011-12-31 ENCOUNTER — Other Ambulatory Visit: Payer: Self-pay | Admitting: Cardiology

## 2012-02-01 ENCOUNTER — Other Ambulatory Visit: Payer: Self-pay

## 2012-02-01 ENCOUNTER — Other Ambulatory Visit: Payer: Self-pay | Admitting: Cardiology

## 2012-02-05 ENCOUNTER — Other Ambulatory Visit: Payer: Self-pay

## 2012-02-05 MED ORDER — DIGOXIN 250 MCG PO TABS: 0.2500 mg | ORAL_TABLET | Freq: Every day | ORAL | Status: DC

## 2012-03-13 ENCOUNTER — Other Ambulatory Visit: Payer: Self-pay | Admitting: Cardiology

## 2012-03-20 ENCOUNTER — Ambulatory Visit: Payer: 59 | Admitting: Physician Assistant

## 2012-03-25 ENCOUNTER — Encounter: Payer: Self-pay | Admitting: Pharmacist

## 2012-04-14 ENCOUNTER — Emergency Department (HOSPITAL_COMMUNITY)
Admission: EM | Admit: 2012-04-14 | Discharge: 2012-04-14 | Disposition: A | Discharge status: Home / Self Care | Payer: 59 | Attending: Emergency Medicine | Admitting: Emergency Medicine

## 2012-04-14 ENCOUNTER — Emergency Department (HOSPITAL_COMMUNITY): Payer: 59

## 2012-04-14 ENCOUNTER — Encounter (HOSPITAL_COMMUNITY): Payer: Self-pay | Admitting: Emergency Medicine

## 2012-04-14 DIAGNOSIS — I4891 Unspecified atrial fibrillation: Secondary | ICD-10-CM | POA: Insufficient documentation

## 2012-04-14 DIAGNOSIS — I509 Heart failure, unspecified: Secondary | ICD-10-CM | POA: Insufficient documentation

## 2012-04-14 DIAGNOSIS — Z9089 Acquired absence of other organs: Secondary | ICD-10-CM | POA: Insufficient documentation

## 2012-04-14 DIAGNOSIS — J209 Acute bronchitis, unspecified: Secondary | ICD-10-CM | POA: Insufficient documentation

## 2012-04-14 DIAGNOSIS — Z79899 Other long term (current) drug therapy: Secondary | ICD-10-CM | POA: Insufficient documentation

## 2012-04-14 DIAGNOSIS — E119 Type 2 diabetes mellitus without complications: Secondary | ICD-10-CM | POA: Insufficient documentation

## 2012-04-14 DIAGNOSIS — I10 Essential (primary) hypertension: Secondary | ICD-10-CM | POA: Insufficient documentation

## 2012-04-14 LAB — POCT I-STAT, CHEM 8
BUN: 14 mg/dL (ref 6–23)
Sodium: 144 mEq/L (ref 135–145)
TCO2: 27 mmol/L (ref 0–100)

## 2012-04-14 LAB — CBC
HCT: 37.2 % — ABNORMAL LOW (ref 39.0–52.0)
MCV: 83.4 fL (ref 78.0–100.0)
Platelets: 183 10*3/uL (ref 150–400)
RBC: 4.46 MIL/uL (ref 4.22–5.81)
WBC: 8 10*3/uL (ref 4.0–10.5)

## 2012-04-14 LAB — POCT I-STAT TROPONIN I: Troponin i, poc: 0 ng/mL (ref 0.00–0.08)

## 2012-04-14 LAB — D-DIMER, QUANTITATIVE: D-Dimer, Quant: 0.4 ug/mL-FEU (ref 0.00–0.48)

## 2012-04-14 MED ORDER — ACETAMINOPHEN 500 MG PO TABS
1000.0000 mg | ORAL_TABLET | Freq: Once | ORAL | Status: AC
  Administered 2012-04-14: 1000 mg via ORAL
  Filled 2012-04-14: qty 2

## 2012-04-14 MED ORDER — ALBUTEROL SULFATE (5 MG/ML) 0.5% IN NEBU
INHALATION_SOLUTION | RESPIRATORY_TRACT | Status: AC
  Filled 2012-04-14: qty 1

## 2012-04-14 MED ORDER — DOXYCYCLINE HYCLATE 100 MG PO TABS
100.0000 mg | ORAL_TABLET | Freq: Once | ORAL | Status: AC
  Administered 2012-04-14: 100 mg via ORAL
  Filled 2012-04-14: qty 1

## 2012-04-14 MED ORDER — DOXYCYCLINE HYCLATE 100 MG PO CAPS: 100.0000 mg | ORAL_CAPSULE | Freq: Two times a day (BID) | ORAL | Status: AC

## 2012-04-14 NOTE — ED Notes (Signed)
Per EMS Gen chest pain tightness, started 2 days ago.

## 2012-04-14 NOTE — ED Notes (Signed)
Per left chest pain started few days ago, having problem breathing deep, and hurt whenever he breaths. Chest pain is radiating towards the back and spreading to right side of the chest. Pt is obese and weighs more than 550 BLS. Pt placed on 2L/[place].

## 2012-04-14 NOTE — ED Provider Notes (Signed)
History     CSN: [id number]  Arrival date & time 04/14/12  0550   First MD Initiated Contact with Patient 04/14/12 [phone]      Chief Complaint  Patient presents with  . Chest Pain    (Consider location/radiation/quality/duration/timing/severity/associated sxs/prior treatment) HPI Complains of cough productive of yellow sputum, pleuritic left-sided chest pain onset one week ago with subjective fevers pain is nonradiating, mild to moderate at present. Admits to lightheadedness no other complaint no vomiting pain is worse with deep breathing not made better by anything no treatment prior to coming here. Other associated symptoms include generalized weakness Past Medical History  Diagnosis Date  . CHF (congestive heart failure)   . HTN (hypertension)   . Ptosis   . Atrial flutter   . Atrial fibrillation   . Morbid obesity   . DM2 (diabetes mellitus, type 2)   . Depression   . Respiratory failure   . Chronic low back pain   . Diabetes mellitus   . Asthma   . Vision abnormalities   . Abscess     right thigh    Past Surgical History  Procedure Date  . Tonsillectomy   . Cholecystectomy   . Tonsillectomy   . Irrigation and debridement abscess 10/13/2011    Procedure: IRRIGATION AND DEBRIDEMENT ABSCESS;  Surgeon: Emelia Loron, MD;  Location: [facility] OR;  Service: General;  Laterality: Right;  Irrigation and debridement right thigh abscess  . Application of wound vac     right thigh abscess    Family History  Problem Relation Age of Onset  . Coronary artery disease Mother     43s; father had it in 10s; both deceased in their 58s    . Cancer Mother     liver  . Heart disease Father   . Cancer Father     prostate    History  Substance Use Topics  . Smoking status: Never Smoker   . Smokeless tobacco: Not on file   Comment: no significant tobacco use   . Alcohol Use: No     no significant use      Review of Systems  Constitutional: Positive for fever.  HENT:  Negative.   Respiratory: Positive for cough and shortness of breath.   Cardiovascular: Positive for chest pain.  Gastrointestinal: Negative.   Musculoskeletal: Negative.   Skin: Negative.   Neurological: Positive for weakness.  Hematological: Negative.   Psychiatric/Behavioral: Negative.   All other systems reviewed and are negative.    Allergies  Shellfish allergy and Iodine  Home Medications   Current Outpatient Rx  Name Route Sig Dispense Refill  . ALBUTEROL SULFATE HFA 108 (90 BASE) MCG/ACT IN AERS Inhalation Inhale 2 puffs into the lungs every 6 (six) hours as needed. Shortness of breath    . AMIODARONE HCL 200 MG PO TABS Oral Take 200 mg by mouth daily.    Marland Kitchen AMLODIPINE BESYLATE 10 MG PO TABS  daily.    . BUPROPION HCL ER (XL) 300 MG PO TB24 Oral Take 1 tablet by mouth Daily.    Marland Kitchen DIGOXIN 0.25 MG PO TABS Oral Take 1 tablet (0.25 mg total) by mouth daily. 30 tablet 3    Please contact to schedule a appointment for futur ...  . DIVALPROEX SODIUM 500 MG PO TBEC Oral Take 1,000 mg by mouth daily.    Marland Kitchen HUMALOG MIX 75/25 (75-25) 100 UNIT/ML [place] SUSP Subcutaneous Inject 20-75 Units into the skin Three times a day before meals. Takes  75 units before breakfast Takes 20 units before lunch Takes 65 units before supper    . LAMOTRIGINE 100 MG PO TABS Oral Take 100 mg by mouth daily.     Marland Kitchen METOPROLOL SUCCINATE ER 50 MG PO TB24 Oral Take 50 mg by mouth daily.    . OXYCODONE HCL 15 MG PO TABS Oral Take 15 mg by mouth every 6 (six) hours. For pain    . RISPERIDONE 2 MG PO TABS Oral Take 1 tablet by mouth Daily.    . ALBUTEROL SULFATE (2.5 MG/3ML) 0.083% IN NEBU Nebulization Take 2.5 mg by nebulization once.    . ASPIRIN 81 MG PO CHEW Oral Chew 324 mg by mouth once.    Marland Kitchen NITROGLYCERIN 0.4 MG SL SUBL Sublingual Place 0.4 mg under the tongue once.      BP 158/78  Pulse 85  Temp 97.6 F (36.4 C) (Oral)  Resp 24  SpO2 95%  Physical Exam  Nursing note and vitals reviewed. Constitutional:  He appears well-developed and well-nourished.  HENT:  Head: Normocephalic and atraumatic.  Eyes: Conjunctivae are normal. Pupils are equal, round, and reactive to light.  Neck: Neck supple. No tracheal deviation present. No thyromegaly present.  Cardiovascular: Normal rate and regular rhythm.   No murmur heard. Pulmonary/Chest: Effort normal and breath sounds normal.       Left chest wall tender, reproducing pain exactly  Abdominal: Soft. Bowel sounds are normal. He exhibits no distension. There is no tenderness.       Morbidly obese  Musculoskeletal: Normal range of motion. He exhibits no edema and no tenderness.  Neurological: He is alert. Coordination normal.  Skin: Skin is warm and dry. No rash noted.  Psychiatric: He has a normal mood and affect.    ED Course  Procedures (including critical care time) Feels improved after treatment with Tylenol. At 9:20 AM patient is alert and ambulates without difficulty  Labs Reviewed  CBC  PROTIME-INR   No results found.   Date: 04/14/2012  Rate: 90  Rhythm: normal sinus rhythm  QRS Axis: normal  Intervals: normal  ST/T Wave abnormalities: normal  Conduction Disutrbances:none  Narrative Interpretation:   Old EKG Reviewed: unchanged No significant changes noted when compared with prior ECG unchanged from 07/25/2010, interpreted by me No diagnosis found.   Results for orders placed during the hospital encounter of 04/14/12  CBC      Component Value Range   WBC 8.0  4.0 - 10.5 K/uL   RBC 4.46  4.22 - 5.81 MIL/uL   Hemoglobin 12.5 (*) 13.0 - 17.0 g/dL   HCT 45.4 (*) 09.8 - 11.9 %   MCV 83.4  78.0 - 100.0 fL   MCH 28.0  26.0 - 34.0 pg   MCHC 33.6  30.0 - 36.0 g/dL   RDW 14.7  82.9 - 56.2 %   Platelets 183  150 - 400 K/uL  PROTIME-INR      Component Value Range   Prothrombin Time 13.8  11.6 - 15.2 seconds   INR 1.04  0.00 - 1.49  D-DIMER, QUANTITATIVE      Component Value Range   D-Dimer, Quant 0.40  0.00 - 0.48 ug/mL-FEU    POCT I-STAT, CHEM 8      Component Value Range   Sodium 144  135 - 145 mEq/L   Potassium 4.0  3.5 - 5.1 mEq/L   Chloride 103  96 - 112 mEq/L   BUN 14  6 - 23 mg/dL  Creatinine, Ser 1.00  0.50 - 1.35 mg/dL   Glucose, Bld 409 (*) 70 - 99 mg/dL   Calcium, Ion 8.11  9.14 - 1.23 mmol/L   TCO2 27  0 - 100 mmol/L   Hemoglobin 13.3  13.0 - 17.0 g/dL   HCT 78.2  95.6 - 21.3 %  POCT I-STAT TROPONIN I      Component Value Range   Troponin i, poc 0.00  0.00 - 0.08 ng/mL   Comment 3            Dg Chest Portable 1 View  04/14/2012  *RADIOLOGY REPORT*  Clinical Data: Chest pain.  Shortness of breath.  Cough.  PORTABLE CHEST - 1 VIEW  Comparison: 03/08/2011 and 11/09/2009  Findings: Cardiomegaly stable.  Mild elevation of the left hemidiaphragm is seen with mild left basilar atelectasis. Pulmonary vascular congestion is stable, and there is no evidence of frank pulmonary edema or acute infiltrate.  IMPRESSION:  1.  Stable cardiomegaly pulmonary venous hypertension. 2.  Elevated left hemidiaphragm with mild left basilar atelectasis.  Original Report Authenticated By: Danae Orleans, M.D.    MDM  Strongly doubt acute coronary syndrome with atypical symptoms not acute EKG negative troponin after one week if symptoms. Chest x-ray of poor quality but doubt pneumonia given no subjective fever no leukocytosis. Doubt pulmonary embolism i.e. low pretest clinical probability negative d-dimer In light of patient's morbid obesity and comorbidities we'll treat with an antibiotic Plan prescription doxycycline Followup Dr.Wolters if not improved in 2 or 3 days, blood pressure recheck within the next 3 weeks Diagnosis #1 bronchitis #2 chest wall pain # 3 hyperglycemia #4hypertension        Doug Sou, MD 04/14/12 [phone]

## 2012-04-19 ENCOUNTER — Encounter (HOSPITAL_COMMUNITY): Payer: Self-pay | Admitting: *Deleted

## 2012-04-19 ENCOUNTER — Emergency Department (HOSPITAL_COMMUNITY): Payer: 59

## 2012-04-19 ENCOUNTER — Inpatient Hospital Stay (HOSPITAL_COMMUNITY)
Admission: EM | Admit: 2012-04-19 | Discharge: 2012-04-25 | DRG: 292 | Disposition: A | Discharge status: Home / Self Care | Payer: 59 | Attending: Family Medicine | Admitting: Family Medicine

## 2012-04-19 DIAGNOSIS — H5500 Unspecified nystagmus: Secondary | ICD-10-CM

## 2012-04-19 DIAGNOSIS — E875 Hyperkalemia: Secondary | ICD-10-CM | POA: Diagnosis present

## 2012-04-19 DIAGNOSIS — J45909 Unspecified asthma, uncomplicated: Secondary | ICD-10-CM | POA: Diagnosis present

## 2012-04-19 DIAGNOSIS — I4891 Unspecified atrial fibrillation: Secondary | ICD-10-CM | POA: Diagnosis present

## 2012-04-19 DIAGNOSIS — I5031 Acute diastolic (congestive) heart failure: Principal | ICD-10-CM | POA: Diagnosis present

## 2012-04-19 DIAGNOSIS — R110 Nausea: Secondary | ICD-10-CM | POA: Diagnosis present

## 2012-04-19 DIAGNOSIS — M545 Low back pain, unspecified: Secondary | ICD-10-CM | POA: Diagnosis present

## 2012-04-19 DIAGNOSIS — L03119 Cellulitis of unspecified part of limb: Secondary | ICD-10-CM

## 2012-04-19 DIAGNOSIS — H02409 Unspecified ptosis of unspecified eyelid: Secondary | ICD-10-CM

## 2012-04-19 DIAGNOSIS — I5033 Acute on chronic diastolic (congestive) heart failure: Secondary | ICD-10-CM | POA: Diagnosis present

## 2012-04-19 DIAGNOSIS — I129 Hypertensive chronic kidney disease with stage 1 through stage 4 chronic kidney disease, or unspecified chronic kidney disease: Secondary | ICD-10-CM | POA: Diagnosis present

## 2012-04-19 DIAGNOSIS — J40 Bronchitis, not specified as acute or chronic: Secondary | ICD-10-CM

## 2012-04-19 DIAGNOSIS — G4733 Obstructive sleep apnea (adult) (pediatric): Secondary | ICD-10-CM | POA: Diagnosis present

## 2012-04-19 DIAGNOSIS — J9600 Acute respiratory failure, unspecified whether with hypoxia or hypercapnia: Secondary | ICD-10-CM

## 2012-04-19 DIAGNOSIS — N179 Acute kidney failure, unspecified: Secondary | ICD-10-CM

## 2012-04-19 DIAGNOSIS — G4730 Sleep apnea, unspecified: Secondary | ICD-10-CM

## 2012-04-19 DIAGNOSIS — Z79899 Other long term (current) drug therapy: Secondary | ICD-10-CM

## 2012-04-19 DIAGNOSIS — Z7901 Long term (current) use of anticoagulants: Secondary | ICD-10-CM

## 2012-04-19 DIAGNOSIS — R7309 Other abnormal glucose: Secondary | ICD-10-CM

## 2012-04-19 DIAGNOSIS — Z794 Long term (current) use of insulin: Secondary | ICD-10-CM

## 2012-04-19 DIAGNOSIS — G8929 Other chronic pain: Secondary | ICD-10-CM | POA: Diagnosis present

## 2012-04-19 DIAGNOSIS — I509 Heart failure, unspecified: Secondary | ICD-10-CM

## 2012-04-19 DIAGNOSIS — E785 Hyperlipidemia, unspecified: Secondary | ICD-10-CM | POA: Diagnosis present

## 2012-04-19 DIAGNOSIS — J309 Allergic rhinitis, unspecified: Secondary | ICD-10-CM

## 2012-04-19 DIAGNOSIS — N181 Chronic kidney disease, stage 1: Secondary | ICD-10-CM | POA: Diagnosis present

## 2012-04-19 DIAGNOSIS — F3289 Other specified depressive episodes: Secondary | ICD-10-CM

## 2012-04-19 DIAGNOSIS — Z6841 Body Mass Index (BMI) 40.0 and over, adult: Secondary | ICD-10-CM

## 2012-04-19 DIAGNOSIS — I10 Essential (primary) hypertension: Secondary | ICD-10-CM | POA: Diagnosis present

## 2012-04-19 DIAGNOSIS — E119 Type 2 diabetes mellitus without complications: Secondary | ICD-10-CM

## 2012-04-19 DIAGNOSIS — R0602 Shortness of breath: Secondary | ICD-10-CM

## 2012-04-19 DIAGNOSIS — I4892 Unspecified atrial flutter: Secondary | ICD-10-CM

## 2012-04-19 DIAGNOSIS — R079 Chest pain, unspecified: Secondary | ICD-10-CM | POA: Diagnosis present

## 2012-04-19 DIAGNOSIS — F329 Major depressive disorder, single episode, unspecified: Secondary | ICD-10-CM

## 2012-04-19 DIAGNOSIS — R609 Edema, unspecified: Secondary | ICD-10-CM | POA: Diagnosis present

## 2012-04-19 LAB — BASIC METABOLIC PANEL
BUN: 22 mg/dL (ref 6–23)
CO2: 33 mEq/L — ABNORMAL HIGH (ref 19–32)
Calcium: 9.3 mg/dL (ref 8.4–10.5)
Chloride: 99 mEq/L (ref 96–112)
Creatinine, Ser: 1.31 mg/dL (ref 0.50–1.35)
Glucose, Bld: 218 mg/dL — ABNORMAL HIGH (ref 70–99)

## 2012-04-19 LAB — BLOOD GAS, ARTERIAL
Acid-Base Excess: 6.7 mmol/L — ABNORMAL HIGH (ref 0.0–2.0)
Bicarbonate: 32.4 mEq/L — ABNORMAL HIGH (ref 20.0–24.0)
O2 Saturation: 94.5 %
Patient temperature: 98.6
TCO2: 28.7 mmol/L (ref 0–100)
pO2, Arterial: 72.7 mmHg — ABNORMAL LOW (ref 80.0–100.0)

## 2012-04-19 LAB — CARDIAC PANEL(CRET KIN+CKTOT+MB+TROPI)
CK, MB: 1.6 ng/mL (ref 0.3–4.0)
Relative Index: 1.5 (ref 0.0–2.5)
Troponin I: <0.3 ng/mL (ref <0.30)

## 2012-04-19 LAB — GLUCOSE, CAPILLARY
Glucose-Capillary: 184 mg/dL — ABNORMAL HIGH (ref 70–99)
Glucose-Capillary: 127 mg/dL — ABNORMAL HIGH (ref 70–99)

## 2012-04-19 LAB — PRO B NATRIURETIC PEPTIDE: Pro B Natriuretic peptide (BNP): 83.1 pg/mL (ref 0–125)

## 2012-04-19 LAB — CBC WITH DIFFERENTIAL/PLATELET
Basophils Absolute: 0 10*3/uL (ref 0.0–0.1)
Eosinophils Relative: 1 % (ref 0–5)
HCT: 40 % (ref 39.0–52.0)
Hemoglobin: 13.3 g/dL (ref 13.0–17.0)
Lymphocytes Relative: 29 % (ref 12–46)
MCV: 84.6 fL (ref 78.0–100.0)
Monocytes Absolute: 0.7 10*3/uL (ref 0.1–1.0)
Monocytes Relative: 8 % (ref 3–12)
Neutro Abs: 5.7 10*3/uL (ref 1.7–7.7)
RDW: 15 % (ref 11.5–15.5)
WBC: 9.3 10*3/uL (ref 4.0–10.5)

## 2012-04-19 LAB — DIGOXIN LEVEL: Digoxin Level: 0.4 ng/mL — ABNORMAL LOW (ref 0.8–2.0)

## 2012-04-19 LAB — POCT I-STAT TROPONIN I: Troponin i, poc: 0 ng/mL (ref 0.00–0.08)

## 2012-04-19 LAB — PROTIME-INR: INR: 0.93 (ref 0.00–1.49)

## 2012-04-19 MED ORDER — DIGOXIN 250 MCG PO TABS
0.2500 mg | ORAL_TABLET | Freq: Every day | ORAL | Status: DC
  Administered 2012-04-20 – 2012-04-25 (×6): 0.25 mg via ORAL
  Filled 2012-04-19 (×6): qty 1

## 2012-04-19 MED ORDER — INSULIN ASPART 100 UNIT/ML ~~LOC~~ SOLN
0.0000 [IU] | Freq: Three times a day (TID) | SUBCUTANEOUS | Status: DC
  Administered 2012-04-20 – 2012-04-21 (×5): 4 [IU] via SUBCUTANEOUS
  Administered 2012-04-22: 3 [IU] via SUBCUTANEOUS
  Administered 2012-04-22: 2 [IU] via SUBCUTANEOUS
  Administered 2012-04-22 – 2012-04-25 (×8): 4 [IU] via SUBCUTANEOUS
  Administered 2012-04-25: 7 [IU] via SUBCUTANEOUS

## 2012-04-19 MED ORDER — SODIUM CHLORIDE 0.9 % IJ SOLN
3.0000 mL | Freq: Two times a day (BID) | INTRAMUSCULAR | Status: DC
  Administered 2012-04-19 – 2012-04-25 (×11): 3 mL via INTRAVENOUS

## 2012-04-19 MED ORDER — ENOXAPARIN SODIUM 150 MG/ML ~~LOC~~ SOLN
1.0000 mg/kg | Freq: Two times a day (BID) | SUBCUTANEOUS | Status: DC
  Administered 2012-04-19: 280 mg via SUBCUTANEOUS
  Filled 2012-04-19 (×3): qty 2

## 2012-04-19 MED ORDER — ONDANSETRON HCL 4 MG PO TABS: 4.0000 mg | ORAL_TABLET | Freq: Four times a day (QID) | ORAL | Status: DC | PRN

## 2012-04-19 MED ORDER — SODIUM CHLORIDE 0.9 % IJ SOLN
3.0000 mL | Freq: Two times a day (BID) | INTRAMUSCULAR | Status: DC
  Administered 2012-04-20 – 2012-04-21 (×2): 3 mL via INTRAVENOUS

## 2012-04-19 MED ORDER — ALUM & MAG HYDROXIDE-SIMETH 200-200-20 MG/5ML PO SUSP: 30.0000 mL | Freq: Four times a day (QID) | ORAL | Status: DC | PRN

## 2012-04-19 MED ORDER — ACETAMINOPHEN 325 MG PO TABS: 650.0000 mg | ORAL_TABLET | Freq: Four times a day (QID) | ORAL | Status: DC | PRN

## 2012-04-19 MED ORDER — DIVALPROEX SODIUM 500 MG PO DR TAB
1000.0000 mg | DELAYED_RELEASE_TABLET | Freq: Every day | ORAL | Status: DC
  Administered 2012-04-20 – 2012-04-25 (×6): 1000 mg via ORAL
  Filled 2012-04-19 (×6): qty 2

## 2012-04-19 MED ORDER — GUAIFENESIN ER 600 MG PO TB12
600.0000 mg | ORAL_TABLET | Freq: Two times a day (BID) | ORAL | Status: DC
  Administered 2012-04-20 – 2012-04-25 (×12): 600 mg via ORAL
  Filled 2012-04-19 (×13): qty 1

## 2012-04-19 MED ORDER — PANTOPRAZOLE SODIUM 40 MG PO TBEC
40.0000 mg | DELAYED_RELEASE_TABLET | Freq: Every day | ORAL | Status: DC
  Administered 2012-04-20 (×2): 40 mg via ORAL
  Filled 2012-04-19 (×2): qty 1

## 2012-04-19 MED ORDER — BUPROPION HCL ER (XL) 300 MG PO TB24
300.0000 mg | ORAL_TABLET | Freq: Every day | ORAL | Status: DC
  Administered 2012-04-20 – 2012-04-25 (×6): 300 mg via ORAL
  Filled 2012-04-19 (×6): qty 1

## 2012-04-19 MED ORDER — NITROGLYCERIN 0.4 MG SL SUBL
0.4000 mg | SUBLINGUAL_TABLET | SUBLINGUAL | Status: DC | PRN
  Administered 2012-04-19 – 2012-04-20 (×2): 0.4 mg via SUBLINGUAL
  Filled 2012-04-19: qty 25

## 2012-04-19 MED ORDER — LEVOFLOXACIN IN D5W 750 MG/150ML IV SOLN
750.0000 mg | INTRAVENOUS | Status: DC
  Administered 2012-04-20 – 2012-04-22 (×3): 750 mg via INTRAVENOUS
  Filled 2012-04-19 (×3): qty 150

## 2012-04-19 MED ORDER — INSULIN ASPART 100 UNIT/ML ~~LOC~~ SOLN: 0.0000 [IU] | Freq: Every day | SUBCUTANEOUS | Status: DC

## 2012-04-19 MED ORDER — FUROSEMIDE 10 MG/ML IJ SOLN
40.0000 mg | Freq: Two times a day (BID) | INTRAMUSCULAR | Status: DC
  Administered 2012-04-19 – 2012-04-20 (×2): 40 mg via INTRAVENOUS
  Filled 2012-04-19 (×3): qty 4

## 2012-04-19 MED ORDER — OXYCODONE HCL 5 MG PO TABS
15.0000 mg | ORAL_TABLET | Freq: Four times a day (QID) | ORAL | Status: DC
  Administered 2012-04-19 – 2012-04-25 (×19): 15 mg via ORAL
  Filled 2012-04-19: qty 2
  Filled 2012-04-19 (×5): qty 3
  Filled 2012-04-19: qty 1
  Filled 2012-04-19 (×2): qty 3
  Filled 2012-04-19: qty 1
  Filled 2012-04-19: qty 3
  Filled 2012-04-19: qty 2
  Filled 2012-04-19 (×10): qty 3

## 2012-04-19 MED ORDER — MORPHINE SULFATE 4 MG/ML IJ SOLN
4.0000 mg | INTRAMUSCULAR | Status: DC | PRN
  Administered 2012-04-19 – 2012-04-23 (×9): 4 mg via INTRAVENOUS
  Filled 2012-04-19 (×9): qty 1

## 2012-04-19 MED ORDER — INSULIN GLARGINE 100 UNIT/ML ~~LOC~~ SOLN
20.0000 [IU] | Freq: Every day | SUBCUTANEOUS | Status: DC
  Administered 2012-04-20 – 2012-04-24 (×5): 20 [IU] via SUBCUTANEOUS

## 2012-04-19 MED ORDER — LAMOTRIGINE 100 MG PO TABS
100.0000 mg | ORAL_TABLET | Freq: Every day | ORAL | Status: DC
  Administered 2012-04-20 – 2012-04-25 (×6): 100 mg via ORAL
  Filled 2012-04-19 (×6): qty 1

## 2012-04-19 MED ORDER — FUROSEMIDE 10 MG/ML IJ SOLN
40.0000 mg | Freq: Once | INTRAMUSCULAR | Status: AC
  Administered 2012-04-19: 40 mg via INTRAVENOUS
  Filled 2012-04-19: qty 4

## 2012-04-19 MED ORDER — SODIUM CHLORIDE 0.9 % IV SOLN: 250.0000 mL | INTRAVENOUS | Status: DC | PRN

## 2012-04-19 MED ORDER — DOCUSATE SODIUM 100 MG PO CAPS
100.0000 mg | ORAL_CAPSULE | Freq: Two times a day (BID) | ORAL | Status: DC
  Administered 2012-04-19 – 2012-04-24 (×10): 100 mg via ORAL
  Filled 2012-04-19 (×13): qty 1

## 2012-04-19 MED ORDER — ALBUTEROL SULFATE (5 MG/ML) 0.5% IN NEBU: 2.5000 mg | INHALATION_SOLUTION | RESPIRATORY_TRACT | Status: DC | PRN

## 2012-04-19 MED ORDER — ASPIRIN EC 325 MG PO TBEC
325.0000 mg | DELAYED_RELEASE_TABLET | Freq: Every day | ORAL | Status: DC
  Administered 2012-04-19 – 2012-04-22 (×4): 325 mg via ORAL
  Filled 2012-04-19 (×5): qty 1

## 2012-04-19 MED ORDER — METOPROLOL SUCCINATE ER 50 MG PO TB24
50.0000 mg | ORAL_TABLET | Freq: Every day | ORAL | Status: DC
  Administered 2012-04-20: 50 mg via ORAL
  Filled 2012-04-19: qty 1

## 2012-04-19 MED ORDER — DEXTROSE 5 % IV SOLN
1.0000 g | Freq: Once | INTRAVENOUS | Status: AC
  Administered 2012-04-19: 1 g via INTRAVENOUS
  Filled 2012-04-19: qty 10

## 2012-04-19 MED ORDER — AMIODARONE HCL 200 MG PO TABS
200.0000 mg | ORAL_TABLET | Freq: Every day | ORAL | Status: DC
  Administered 2012-04-20 – 2012-04-25 (×6): 200 mg via ORAL
  Filled 2012-04-19 (×6): qty 1

## 2012-04-19 MED ORDER — ONDANSETRON HCL 4 MG/2ML IJ SOLN
4.0000 mg | Freq: Four times a day (QID) | INTRAMUSCULAR | Status: DC | PRN
  Administered 2012-04-21 – 2012-04-23 (×2): 4 mg via INTRAVENOUS
  Filled 2012-04-19 (×3): qty 2

## 2012-04-19 MED ORDER — SIMVASTATIN 20 MG PO TABS
20.0000 mg | ORAL_TABLET | Freq: Every day | ORAL | Status: DC
Start: 2012-04-20 — End: 2012-04-25
  Administered 2012-04-20 – 2012-04-24 (×4): 20 mg via ORAL
  Filled 2012-04-19 (×6): qty 1

## 2012-04-19 MED ORDER — ALBUTEROL SULFATE (5 MG/ML) 0.5% IN NEBU
2.5000 mg | INHALATION_SOLUTION | Freq: Four times a day (QID) | RESPIRATORY_TRACT | Status: DC
  Administered 2012-04-19 – 2012-04-22 (×12): 2.5 mg via RESPIRATORY_TRACT
  Filled 2012-04-19 (×11): qty 0.5

## 2012-04-19 MED ORDER — RISPERIDONE 2 MG PO TABS
2.0000 mg | ORAL_TABLET | Freq: Every day | ORAL | Status: DC
  Administered 2012-04-20 – 2012-04-24 (×5): 2 mg via ORAL
  Filled 2012-04-19 (×6): qty 1

## 2012-04-19 MED ORDER — ACETAMINOPHEN 650 MG RE SUPP: 650.0000 mg | Freq: Four times a day (QID) | RECTAL | Status: DC | PRN

## 2012-04-19 MED ORDER — DEXTROSE 5 % IV SOLN
500.0000 mg | Freq: Once | INTRAVENOUS | Status: AC
  Administered 2012-04-19: 20:00:00 via INTRAVENOUS
  Filled 2012-04-19: qty 500

## 2012-04-19 MED ORDER — AMLODIPINE BESYLATE 10 MG PO TABS
10.0000 mg | ORAL_TABLET | Freq: Every day | ORAL | Status: DC
  Administered 2012-04-20 – 2012-04-25 (×6): 10 mg via ORAL
  Filled 2012-04-19 (×6): qty 1

## 2012-04-19 MED ORDER — SODIUM CHLORIDE 0.9 % IJ SOLN: 3.0000 mL | INTRAMUSCULAR | Status: DC | PRN

## 2012-04-19 NOTE — ED Notes (Signed)
[place] to return call to this Clinical research associate for report

## 2012-04-19 NOTE — ED Provider Notes (Signed)
History     CSN: [id number]  Arrival date & time 04/19/12  1528   First MD Initiated Contact with Patient 04/19/12 1546      Chief Complaint  Patient presents with  . Shortness of Breath  . Chest Pain    (Consider location/radiation/quality/duration/timing/severity/associated sxs/prior treatment) HPI Comments: Pt presents with chest tightness and shortness of breath. This started about a week ago and has been progressively getting worse since then. He describes a tightness with intermittent sharp pains across his chest and radiating to bilateral upper back area. Says his pain is worse with inspiration. He does have a cough productive of some light yellow sputum. He was seen here recently for similar symptoms although he says now symptoms are worse. He was given a prescriptive for doxycycline for possible bronchitis however he was not aware that he was given a prescription and has not been taking the doxycycline. Denies a fevers or chills. He's had some mild increase in his leg swelling. He has a history of atrial fibrillation as well as congestive heart failure and diabetes. He denies a history of pulmonary embolus. He does say he's on Coumadin although this is not included in his medication list. Cardiologist is Dr. Jens Som with [facility] cardiology and his primary care physician is Dr. Laurine Blazer with [facility] physicians he is currently on home O2 chronically at 3 L per minute  Patient is a 41 y.o. male presenting with shortness of breath and chest pain. The history is provided by the patient.  Shortness of Breath  Associated symptoms include chest pain, cough and shortness of breath. Pertinent negatives include no fever and no rhinorrhea.  Chest Pain Primary symptoms include fatigue, shortness of breath, cough and dizziness. Pertinent negatives for primary symptoms include no fever, no abdominal pain, no nausea and no vomiting.  Dizziness does not occur with nausea, vomiting, weakness or  diaphoresis.  Pertinent negatives for associated symptoms include no diaphoresis, no numbness and no weakness.     Past Medical History  Diagnosis Date  . CHF (congestive heart failure)   . HTN (hypertension)   . Ptosis   . Atrial flutter   . Atrial fibrillation   . Morbid obesity   . DM2 (diabetes mellitus, type 2)   . Depression   . Respiratory failure   . Chronic low back pain   . Diabetes mellitus   . Asthma   . Vision abnormalities   . Abscess     right thigh    Past Surgical History  Procedure Date  . Tonsillectomy   . Cholecystectomy   . Tonsillectomy   . Irrigation and debridement abscess 10/13/2011    Procedure: IRRIGATION AND DEBRIDEMENT ABSCESS;  Surgeon: Emelia Loron, MD;  Location: [facility] OR;  Service: General;  Laterality: Right;  Irrigation and debridement right thigh abscess  . Application of wound vac     right thigh abscess    Family History  Problem Relation Age of Onset  . Coronary artery disease Mother     45s; father had it in 31s; both deceased in their 53s    . Cancer Mother     liver  . Heart disease Father   . Cancer Father     prostate    History  Substance Use Topics  . Smoking status: Never Smoker   . Smokeless tobacco: Not on file   Comment: no significant tobacco use   . Alcohol Use: No     no significant use  Review of Systems  Constitutional: Positive for fatigue. Negative for fever, chills and diaphoresis.  HENT: Negative for congestion, rhinorrhea and sneezing.   Eyes: Negative.   Respiratory: Positive for cough and shortness of breath. Negative for chest tightness.   Cardiovascular: Positive for chest pain and leg swelling.  Gastrointestinal: Negative for nausea, vomiting, abdominal pain, diarrhea and blood in stool.  Genitourinary: Negative for frequency, hematuria, flank pain and difficulty urinating.  Musculoskeletal: Negative for back pain and arthralgias.  Skin: Negative for rash.  Neurological: Positive  for dizziness. Negative for speech difficulty, weakness, numbness and headaches.    Allergies  Shellfish allergy and Iodine  Home Medications   Current Outpatient Rx  Name Route Sig Dispense Refill  . ALBUTEROL SULFATE (2.5 MG/3ML) 0.083% IN NEBU Nebulization Take 2.5 mg by nebulization once.    . ALBUTEROL SULFATE HFA 108 (90 BASE) MCG/ACT IN AERS Inhalation Inhale 2 puffs into the lungs every 6 (six) hours as needed. Shortness of breath    . AMIODARONE HCL 200 MG PO TABS Oral Take 200 mg by mouth daily.    Marland Kitchen AMLODIPINE BESYLATE 10 MG PO TABS  daily.    . ASPIRIN 81 MG PO CHEW Oral Chew 324 mg by mouth once.    . BUPROPION HCL ER (XL) 300 MG PO TB24 Oral Take 1 tablet by mouth Daily.    Marland Kitchen DIGOXIN 0.25 MG PO TABS Oral Take 1 tablet (0.25 mg total) by mouth daily. 30 tablet 3    Please contact to schedule a appointment for futur ...  . DIVALPROEX SODIUM 500 MG PO TBEC Oral Take 1,000 mg by mouth daily.    Marland Kitchen DOXYCYCLINE HYCLATE 100 MG PO CAPS Oral Take 1 capsule (100 mg total) by mouth 2 (two) times daily. 14 capsule 0  . HUMALOG MIX 75/25 (75-25) 100 UNIT/ML  SUSP Subcutaneous Inject 20-75 Units into the skin Three times a day before meals. Takes 75 units before breakfast Takes 20 units before lunch Takes 65 units before supper    . LAMOTRIGINE 100 MG PO TABS Oral Take 100 mg by mouth daily.     Marland Kitchen METOPROLOL SUCCINATE ER 50 MG PO TB24 Oral Take 50 mg by mouth daily.    Marland Kitchen NITROGLYCERIN 0.4 MG SL SUBL Sublingual Place 0.4 mg under the tongue once.    . OXYCODONE HCL 15 MG PO TABS Oral Take 15 mg by mouth every 6 (six) hours. For pain    . RISPERIDONE 2 MG PO TABS Oral Take 1 tablet by mouth Daily.      BP 175/84  Pulse 104  Temp 97.8 F (36.6 C) (Oral)  Resp 24  SpO2 95%  Physical Exam  Constitutional: He is oriented to person, place, and time. He appears well-developed and well-nourished.       morbidly obese  HENT:  Head: Normocephalic and atraumatic.  Eyes: Pupils are equal,  round, and reactive to light.  Neck: Normal range of motion. Neck supple.  Cardiovascular: Normal rate, regular rhythm and normal heart sounds.   Pulmonary/Chest: Effort normal and breath sounds normal. No respiratory distress. He has no wheezes. He has no rales. He exhibits tenderness (mild reproducible tenderness across chest).  Abdominal: Soft. Bowel sounds are normal. There is no tenderness. There is no rebound and no guarding.  Musculoskeletal: Normal range of motion. He exhibits edema. He exhibits no tenderness.  Lymphadenopathy:    He has no cervical adenopathy.  Neurological: He is alert and oriented to person, place,  and time.  Skin: Skin is warm and dry. No rash noted.  Psychiatric: He has a normal mood and affect.    ED Course  Procedures (including critical care time)  Results for orders placed during the hospital encounter of 04/19/12  CBC WITH DIFFERENTIAL      Component Value Range   WBC 9.3  4.0 - 10.5 K/uL   RBC 4.73  4.22 - 5.81 MIL/uL   Hemoglobin 13.3  13.0 - 17.0 g/dL   HCT 78.2  95.6 - 21.3 %   MCV 84.6  78.0 - 100.0 fL   MCH 28.1  26.0 - 34.0 pg   MCHC 33.3  30.0 - 36.0 g/dL   RDW 08.6  57.8 - 46.9 %   Platelets 198  150 - 400 K/uL   Neutrophils Relative 62  43 - 77 %   Neutro Abs 5.7  1.7 - 7.7 K/uL   Lymphocytes Relative 29  12 - 46 %   Lymphs Abs 2.7  0.7 - 4.0 K/uL   Monocytes Relative 8  3 - 12 %   Monocytes Absolute 0.7  0.1 - 1.0 K/uL   Eosinophils Relative 1  0 - 5 %   Eosinophils Absolute 0.1  0.0 - 0.7 K/uL   Basophils Relative 0  0 - 1 %   Basophils Absolute 0.0  0.0 - 0.1 K/uL  BASIC METABOLIC PANEL      Component Value Range   Sodium 140  135 - 145 mEq/L   Potassium 4.5  3.5 - 5.1 mEq/L   Chloride 99  96 - 112 mEq/L   CO2 33 (*) 19 - 32 mEq/L   Glucose, Bld 218 (*) 70 - 99 mg/dL   BUN 22  6 - 23 mg/dL   Creatinine, Ser 6.29  0.50 - 1.35 mg/dL   Calcium 9.3  8.4 - 52.8 mg/dL   GFR calc non Af Amer 66 (*) >90 mL/min   GFR calc Af Amer  77 (*) >90 mL/min  PRO B NATRIURETIC PEPTIDE      Component Value Range   Pro B Natriuretic peptide (BNP) 83.1  0 - 125 pg/mL  D-DIMER, QUANTITATIVE      Component Value Range   D-Dimer, Quant 0.47  0.00 - 0.48 ug/mL-FEU  PROTIME-INR      Component Value Range   Prothrombin Time 12.7  11.6 - 15.2 seconds   INR 0.93  0.00 - 1.49  DIGOXIN LEVEL      Component Value Range   Digoxin Level 0.4 (*) 0.8 - 2.0 ng/mL  POCT I-STAT TROPONIN I      Component Value Range   Troponin i, poc 0.00  0.00 - 0.08 ng/mL   Comment 3           GLUCOSE, CAPILLARY      Component Value Range   Glucose-Capillary 184 (*) 70 - 99 mg/dL   Comment 1 Notify RN    LACTIC ACID, PLASMA      Component Value Range   Lactic Acid, Venous 2.0  0.5 - 2.2 mmol/L   Dg Chest 2 View  04/19/2012  *RADIOLOGY REPORT*  Clinical Data: Shortness of breath.  Chest pain.  CHEST - 2 VIEW  Comparison: Chest x-ray 04/14/2012.  Findings: Lung volumes are normal.  No consolidative airspace disease.  Severe pulmonary venous congestion, but no frank pulmonary edema at this time.  No acute consolidative airspace disease.  No pleural effusions.  Heart size is mildly enlarged (unchanged).  Mediastinal contours are unremarkable.  IMPRESSION: 1.  Mild cardiomegaly with severe pulmonary venous congestion, but no frank pulmonary edema at this time.  Original Report Authenticated By: Florencia Reasons, M.D.   Dg Chest Portable 1 View  04/14/2012  *RADIOLOGY REPORT*  Clinical Data: Chest pain.  Shortness of breath.  Cough.  PORTABLE CHEST - 1 VIEW  Comparison: 03/08/2011 and 11/09/2009  Findings: Cardiomegaly stable.  Mild elevation of the left hemidiaphragm is seen with mild left basilar atelectasis. Pulmonary vascular congestion is stable, and there is no evidence of frank pulmonary edema or acute infiltrate.  IMPRESSION:  1.  Stable cardiomegaly pulmonary venous hypertension. 2.  Elevated left hemidiaphragm with mild left basilar atelectasis.   Original Report Authenticated By: Danae Orleans, M.D.      Date: 04/19/2012  Rate: 99  Rhythm: normal sinus rhythm  QRS Axis: right  Intervals: normal  ST/T Wave abnormalities: nonspecific ST/T changes  Conduction Disutrbances:none  Narrative Interpretation:   Old EKG Reviewed: unchanged   1. CHF (congestive heart failure)       MDM  Pt with CHF changes on CXR. Given lasix.  Feel that pt needs to be admitted for further cardiac evaluation.  Doubt PE.  Spoke with hospitalist who will admit pt, but wants [place] to start tx for possible pneumonia.        Rolan Bucco, MD 04/19/12 1850

## 2012-04-19 NOTE — ED Notes (Signed)
MD at bedside. Dr. Belfi at bedside.  

## 2012-04-19 NOTE — ED Notes (Addendum)
Pt c/o SOB since last week, states was evaluated for same at [facility] and diagnosed with bronchitis, pt has not filled prescriptions, states SOB is getting worse not better. Pt also c/o right side chest pain that "goes through to my back" since last week.

## 2012-04-19 NOTE — H&P (Signed)
PCP:   Emeterio Reeve, MD   Chief Complaint:  Chest pain/SOB for 4 days.  HPI: [name] is a morbidly obese, diabetic gentleman with hx of ?diastolic chf, afib, supposed to be on coumadin but stopped coumadin in April due to insurance issues. He comes in with cough productive of clear sputum, associated with pleuritic chest pain both lung bases but radiating to the mid back since about 4 days ago. Was seen on 04/14/12, treated as if acute bronchitis but did not fill prescription for doxycycline. He says he had fever earlier in the week. He does not believe there are relieving factors. His cxr today suggested "congestion". EKG- non specific changes. Troponin/bnp/d.dimer negative. He was given abx/laxis, and referred for admission.  Review of Systems:  Unremarkable except as highlighted in hpi.  Past Medical History: Past Medical History  Diagnosis Date  . CHF (congestive heart failure)   . HTN (hypertension)   . Ptosis   . Atrial flutter   . Atrial fibrillation   . Morbid obesity   . DM2 (diabetes mellitus, type 2)   . Depression   . Respiratory failure   . Chronic low back pain   . Diabetes mellitus   . Asthma   . Vision abnormalities   . Abscess     right thigh   Past Surgical History  Procedure Date  . Tonsillectomy   . Cholecystectomy   . Tonsillectomy   . Irrigation and debridement abscess 10/13/2011    Procedure: IRRIGATION AND DEBRIDEMENT ABSCESS;  Surgeon: Emelia Loron, MD;  Location: [facility] OR;  Service: General;  Laterality: Right;  Irrigation and debridement right thigh abscess  . Application of wound vac     right thigh abscess    Medications: Prior to Admission medications   Medication Sig Start Date End Date Taking? Authorizing Provider  albuterol (PROVENTIL) (2.5 MG/3ML) 0.083% nebulizer solution Take 2.5 mg by nebulization once.   Yes Historical Provider, MD  albuterol (VENTOLIN HFA) 108 (90 BASE) MCG/ACT inhaler Inhale 2 puffs into the lungs every 6  (six) hours as needed. Shortness of breath   Yes Historical Provider, MD  amiodarone (PACERONE) 200 MG tablet Take 200 mg by mouth daily.   Yes Historical Provider, MD  amLODipine (NORVASC) 10 MG tablet daily. 11/13/11  Yes Historical Provider, MD  aspirin 81 MG chewable tablet Chew 324 mg by mouth once.   Yes Historical Provider, MD  buPROPion (WELLBUTRIN XL) 300 MG 24 hr tablet Take 1 tablet by mouth Daily. 03/13/12  Yes Historical Provider, MD  digoxin (LANOXIN) 0.25 MG tablet Take 1 tablet (0.25 mg total) by mouth daily. 02/05/12  Yes Lewayne Bunting, MD  divalproex (DEPAKOTE) 500 MG DR tablet Take 1,000 mg by mouth daily.   Yes Historical Provider, MD  doxycycline (VIBRAMYCIN) 100 MG capsule Take 1 capsule (100 mg total) by mouth 2 (two) times daily. 04/14/12 04/24/12 Yes Sam Ethelda Chick, MD  HUMALOG MIX 75/25 (75-25) 100 UNIT/ML SUSP Inject 20-75 Units into the skin Three times a day before meals. Takes 75 units before breakfast Takes 20 units before lunch Takes 65 units before supper 03/13/12  Yes Historical Provider, MD  lamoTRIgine (LAMICTAL) 100 MG tablet Take 100 mg by mouth daily.  10/23/11  Yes Historical Provider, MD  metoprolol (TOPROL-XL) 50 MG 24 hr tablet Take 50 mg by mouth daily. 05/10/11  Yes Lewayne Bunting, MD  nitroGLYCERIN (NITROSTAT) 0.4 MG SL tablet Place 0.4 mg under the tongue once.   Yes Historical Provider,  MD  oxyCODONE (ROXICODONE) 15 MG immediate release tablet Take 15 mg by mouth every 6 (six) hours. For pain   Yes Historical Provider, MD  risperiDONE (RISPERDAL) 2 MG tablet Take 1 tablet by mouth Daily. 03/13/12  Yes Historical Provider, MD    Allergies:   Allergies  Allergen Reactions  . Shellfish Allergy Anaphylaxis  . Iodine     Associated with the shellfish allergy    Social History:  reports that he has never smoked. He does not have any smokeless tobacco history on file. He reports that he does not drink alcohol or use illicit drugs.  Family  History: Family History  Problem Relation Age of Onset  . Coronary artery disease Mother     91s; father had it in 78s; both deceased in their 43s    . Cancer Mother     liver  . Heart disease Father   . Cancer Father     prostate    Physical Exam: Filed Vitals:   04/19/12 1532 04/19/12 1548 04/19/12 1915 04/19/12 1932  BP: 175/84   153/75  Pulse: 104   92  Temp: 97.8 F (36.6 C)     TempSrc: Oral     Resp: 24   24  Weight:   266.715 kg (588 lb)   SpO2: 92% 95%  90%   Morbidly obese. In mild respiratory distress.  Short neck. Lungs clear. S1S2. RRR. Abdomen- soft, non tender. +BS. CNS- grossly intact. Extremities- tinge of pedal edema.   Labs on Admission:   [facility] 04/19/12 1648  NA 140  K 4.5  CL 99  CO2 33*  GLUCOSE 218*  BUN 22  CREATININE 1.31  CALCIUM 9.3  MG --  PHOS --   No results found for this basename: AST:2,ALT:2,ALKPHOS:2,BILITOT:2,PROT:2,ALBUMIN:2 in the last 72 hours No results found for this basename: LIPASE:2,AMYLASE:2 in the last 72 hours  Basename 04/19/12 1648  WBC 9.3  NEUTROABS 5.7  HGB 13.3  HCT 40.0  MCV 84.6  PLT 198   No results found for this basename: CKTOTAL:3,CKMB:3,CKMBINDEX:3,TROPONINI:3 in the last 72 hours No results found for this basename: TSH,T4TOTAL,FREET3,T3FREE,THYROIDAB in the last 72 hours No results found for this basename: VITAMINB12:2,FOLATE:2,FERRITIN:2,TIBC:2,IRON:2,RETICCTPCT:2 in the last 72 hours  Radiological Exams on Admission: Dg Chest 2 View  04/19/2012  *RADIOLOGY REPORT*  Clinical Data: Shortness of breath.  Chest pain.  CHEST - 2 VIEW  Comparison: Chest x-ray 04/14/2012.  Findings: Lung volumes are normal.  No consolidative airspace disease.  Severe pulmonary venous congestion, but no frank pulmonary edema at this time.  No acute consolidative airspace disease.  No pleural effusions.  Heart size is mildly enlarged (unchanged).  Mediastinal contours are unremarkable.  IMPRESSION: 1.  Mild cardiomegaly  with severe pulmonary venous congestion, but no frank pulmonary edema at this time.  Original Report Authenticated By: Florencia Reasons, M.D.   Dg Chest Portable 1 View  04/14/2012  *RADIOLOGY REPORT*  Clinical Data: Chest pain.  Shortness of breath.  Cough.  PORTABLE CHEST - 1 VIEW  Comparison: 03/08/2011 and 11/09/2009  Findings: Cardiomegaly stable.  Mild elevation of the left hemidiaphragm is seen with mild left basilar atelectasis. Pulmonary vascular congestion is stable, and there is no evidence of frank pulmonary edema or acute infiltrate.  IMPRESSION:  1.  Stable cardiomegaly pulmonary venous hypertension. 2.  Elevated left hemidiaphragm with mild left basilar atelectasis.  Original Report Authenticated By: Danae Orleans, M.D.    Assessment Pleuritic chest pain associated with productive cough/SOBE/subjective  fever, in morbidly obese diabetic male. CAP verus chf decompensation versus atelectasis vs ACS. Unlikely pe.  Plan   .Chest pain- admit tele. ROMI protocol/ct chest without contrast if weight allows/abg/2decho, assess lung parenchyma better/incentive spirometry/abx/bronchodilators/O2/lovenox/beta blocker/asa/statin/lasix/ntg/morphine as necessary/ppi. Marland KitchenHTN (hypertension), benign- resume home meds. .CHF (congestive heart failure)- as for chest pain. .OSA (obstructive sleep apnea)- needs to use cpap. Compliance questionable. .A-fib- rate controlled. Resume anticoagulation.  Condition closely guarded.  Nalea Salce [phone] 04/19/2012, 8:15 PM

## 2012-04-19 NOTE — ED Notes (Signed)
Hospitalist at bedside 

## 2012-04-19 NOTE — Progress Notes (Signed)
ANTICOAGULATION CONSULT NOTE - Initial Consult  Pharmacy Consult for Lovenox Indication: ACS/A-fib  Allergies  Allergen Reactions  . Shellfish Allergy Anaphylaxis  . Iodine     Associated with the shellfish allergy    Patient Measurements: Height: 6\' 4"  (193 cm) Weight: 611 lb 12.4 oz (277.5 kg) IBW/kg (Calculated) : 86.8    Vital Signs: Temp: 98 F (36.7 C) (07/19 2304) Temp src: Oral (07/19 2304) BP: 166/95 mmHg (07/19 2304) Pulse Rate: 83  (07/19 2304)  Labs:  Basename 04/19/12 2045 04/19/12 1648 04/19/12 1640  HGB -- 13.3 --  HCT -- 40.0 --  PLT -- 198 --  APTT -- -- --  LABPROT -- -- 12.7  INR -- -- 0.93  HEPARINUNFRC -- -- --  CREATININE -- 1.31 --  CKTOTAL 106 -- --  CKMB 1.6 -- --  TROPONINI <0.30 -- --    Estimated Creatinine Clearance: 171.2 ml/min (by C-G formula based on Cr of 1.31).   Medical History: Past Medical History  Diagnosis Date  . CHF (congestive heart failure)   . HTN (hypertension)   . Ptosis   . Atrial flutter   . Atrial fibrillation   . Morbid obesity   . DM2 (diabetes mellitus, type 2)   . Depression   . Respiratory failure   . Chronic low back pain   . Diabetes mellitus   . Asthma   . Vision abnormalities   . Abscess     right thigh    Medications:  Scheduled:    . albuterol  2.5 mg Nebulization Q6H  . amiodarone  200 mg Oral Daily  . amLODipine  10 mg Oral Daily  . aspirin EC  325 mg Oral Daily  . azithromycin  500 mg Intravenous Once  . buPROPion  300 mg Oral Daily  . cefTRIAXone (ROCEPHIN)  IV  1 g Intravenous Once  . digoxin  0.25 mg Oral Daily  . divalproex  1,000 mg Oral Daily  . docusate sodium  100 mg Oral BID  . enoxaparin (LOVENOX) injection  1 mg/kg Subcutaneous Q12H  . furosemide  40 mg Intravenous Once  . furosemide  40 mg Intravenous Q12H  . guaiFENesin  600 mg Oral BID  . insulin aspart  0-20 Units Subcutaneous TID WC  . insulin aspart  0-5 Units Subcutaneous QHS  . insulin glargine  20  Units Subcutaneous QHS  . lamoTRIgine  100 mg Oral Daily  . levofloxacin (LEVAQUIN) IV  750 mg Intravenous Q24H  . metoprolol succinate  50 mg Oral Daily  . oxyCODONE  15 mg Oral Q6H  . pantoprazole  40 mg Oral Q1200  . risperiDONE  2 mg Oral QHS  . simvastatin  20 mg Oral q1800  . sodium chloride  3 mL Intravenous Q12H  . sodium chloride  3 mL Intravenous Q12H   Infusions:    Assessment: 41 yo morbidly obese male on coumadin in past for A-fib.  Stopped coumadin in April due to insurance issues.  Presenting today c/o CP, r/o ACS, resume anticoagulation for A-fib. Goal of Therapy:  Anti-Xa level 0.6-1.2 units/ml 4hrs after LMWH dose given Monitor platelets by anticoagulation protocol: Yes   Plan:   Lovenox 1mg /kg q12h.  F/U SCr/CrCl  Check level in morbidly obese patient.  Susanne Greenhouse R 04/19/2012,11:22 PM

## 2012-04-20 ENCOUNTER — Inpatient Hospital Stay (HOSPITAL_COMMUNITY): Payer: 59

## 2012-04-20 ENCOUNTER — Other Ambulatory Visit (HOSPITAL_COMMUNITY): Payer: 59

## 2012-04-20 DIAGNOSIS — I517 Cardiomegaly: Secondary | ICD-10-CM

## 2012-04-20 LAB — URINALYSIS, ROUTINE W REFLEX MICROSCOPIC
Bilirubin Urine: NEGATIVE
Ketones, ur: NEGATIVE mg/dL
Nitrite: NEGATIVE
Protein, ur: NEGATIVE mg/dL
pH: 6.5 (ref 5.0–8.0)

## 2012-04-20 LAB — GLUCOSE, CAPILLARY
Glucose-Capillary: 133 mg/dL — ABNORMAL HIGH (ref 70–99)
Glucose-Capillary: 161 mg/dL — ABNORMAL HIGH (ref 70–99)
Glucose-Capillary: 192 mg/dL — ABNORMAL HIGH (ref 70–99)
Glucose-Capillary: 147 mg/dL — ABNORMAL HIGH (ref 70–99)

## 2012-04-20 LAB — HEMOGLOBIN A1C
Hgb A1c MFr Bld: 7.8 % — ABNORMAL HIGH (ref <5.7)
Mean Plasma Glucose: 177 mg/dL — ABNORMAL HIGH (ref <117)

## 2012-04-20 LAB — CARDIAC PANEL(CRET KIN+CKTOT+MB+TROPI)
CK, MB: 1.5 ng/mL (ref 0.3–4.0)
Troponin I: <0.3 ng/mL (ref <0.30)
Troponin I: <0.3 ng/mL (ref <0.30)

## 2012-04-20 LAB — PHOSPHORUS: Phosphorus: 4.9 mg/dL — ABNORMAL HIGH (ref 2.3–4.6)

## 2012-04-20 LAB — LIPID PANEL
Cholesterol: 164 mg/dL (ref 0–200)
LDL Cholesterol: 88 mg/dL (ref 0–99)
Total CHOL/HDL Ratio: 3 RATIO
VLDL: 21 mg/dL (ref 0–40)

## 2012-04-20 LAB — PROTIME-INR
INR: 1 (ref 0.00–1.49)
Prothrombin Time: 13.4 seconds (ref 11.6–15.2)

## 2012-04-20 LAB — APTT: aPTT: 38 seconds — ABNORMAL HIGH (ref 24–37)

## 2012-04-20 MED ORDER — SUCRALFATE 1 G PO TABS
1.0000 g | ORAL_TABLET | Freq: Three times a day (TID) | ORAL | Status: DC
  Administered 2012-04-20 – 2012-04-25 (×18): 1 g via ORAL
  Filled 2012-04-20 (×24): qty 1

## 2012-04-20 MED ORDER — ENOXAPARIN SODIUM 150 MG/ML ~~LOC~~ SOLN
150.0000 mg | SUBCUTANEOUS | Status: DC
  Administered 2012-04-20 – 2012-04-22 (×2): 150 mg via SUBCUTANEOUS
  Filled 2012-04-20 (×3): qty 1

## 2012-04-20 MED ORDER — FUROSEMIDE 10 MG/ML IJ SOLN
60.0000 mg | Freq: Two times a day (BID) | INTRAMUSCULAR | Status: DC
  Administered 2012-04-20 – 2012-04-23 (×6): 60 mg via INTRAVENOUS
  Filled 2012-04-20 (×8): qty 6

## 2012-04-20 MED ORDER — WARFARIN SODIUM 7.5 MG PO TABS
15.0000 mg | ORAL_TABLET | Freq: Once | ORAL | Status: AC
  Administered 2012-04-20: 15 mg via ORAL
  Filled 2012-04-20: qty 2

## 2012-04-20 MED ORDER — WARFARIN - PHARMACIST DOSING INPATIENT
Freq: Every day | Status: DC
  Administered 2012-04-20: 19:00:00

## 2012-04-20 MED ORDER — METOPROLOL SUCCINATE ER 50 MG PO TB24
75.0000 mg | ORAL_TABLET | Freq: Every day | ORAL | Status: DC
  Administered 2012-04-21 – 2012-04-25 (×5): 75 mg via ORAL
  Filled 2012-04-20 (×5): qty 1

## 2012-04-20 MED ORDER — NITROGLYCERIN 0.4 MG SL SUBL
SUBLINGUAL_TABLET | SUBLINGUAL | Status: AC
  Administered 2012-04-20: 0.4 mg via SUBLINGUAL
  Filled 2012-04-20: qty 25

## 2012-04-20 MED ORDER — PANTOPRAZOLE SODIUM 40 MG PO TBEC
40.0000 mg | DELAYED_RELEASE_TABLET | Freq: Two times a day (BID) | ORAL | Status: DC
  Administered 2012-04-20 – 2012-04-25 (×9): 40 mg via ORAL
  Filled 2012-04-20 (×13): qty 1

## 2012-04-20 NOTE — Progress Notes (Signed)
  Echocardiogram 2D Echocardiogram has been performed.  [name] 04/20/2012, 8:33 AM

## 2012-04-20 NOTE — Progress Notes (Signed)
Reviewed studies- grade 2 diastolic chf on 2 d echo. Fluid balnce- -2600Could not get ct chest due to being overweight C/O of chest pain, more right sided, radiating to the back which had some temporary relief with nitro and morphine but is back. Spoke with Dr Riley Kill- Dr Eldridge Dace will graciously see patient in consult for the chest pain, ? Further cardiac work up. Patient apparently beyond table weight limit for cath/stress test.   1. CHF (congestive heart failure)     Past Medical History  Diagnosis Date  . CHF (congestive heart failure)   . HTN (hypertension)   . Ptosis   . Atrial flutter   . Atrial fibrillation   . Morbid obesity   . DM2 (diabetes mellitus, type 2)   . Depression   . Respiratory failure   . Chronic low back pain   . Diabetes mellitus   . Asthma   . Vision abnormalities   . Abscess     right thigh   Current Facility-Administered Medications  Medication Dose Route Frequency Provider Last Rate Last Dose  . 0.9 %  sodium chloride infusion  250 mL Intravenous PRN Lorelie Biermann, MD      . acetaminophen (TYLENOL) tablet 650 mg  650 mg Oral Q6H PRN Alexcis Bicking, MD       Or  . acetaminophen (TYLENOL) suppository 650 mg  650 mg Rectal Q6H PRN Iori Gigante, MD      . albuterol (PROVENTIL) (5 MG/ML) 0.5% nebulizer solution 2.5 mg  2.5 mg Nebulization Q6H Stanislaus Kaltenbach, MD   2.5 mg at 04/20/12 0911  . albuterol (PROVENTIL) (5 MG/ML) 0.5% nebulizer solution 2.5 mg  2.5 mg Nebulization Q2H PRN Marra Fraga, MD      . alum & mag hydroxide-simeth (MAALOX/MYLANTA) 200-200-20 MG/5ML suspension 30 mL  30 mL Oral Q6H PRN Alysia Scism, MD      . amiodarone (PACERONE) tablet 200 mg  200 mg Oral Daily Nijae Doyel, MD   200 mg at 04/20/12 1146  . amLODipine (NORVASC) tablet 10 mg  10 mg Oral Daily Oluwadamilare Tobler, MD   10 mg at 04/20/12 1144  . aspirin EC tablet 325 mg  325 mg Oral Daily Marie Chow, MD   325 mg at 04/20/12 1146  . azithromycin (ZITHROMAX) 500 mg in dextrose 5  % 250 mL IVPB  500 mg Intravenous Once Rolan Bucco, MD      . buPROPion (WELLBUTRIN XL) 24 hr tablet 300 mg  300 mg Oral Daily Burgundy Matuszak, MD   300 mg at 04/20/12 1146  . cefTRIAXone (ROCEPHIN) 1 g in dextrose 5 % 50 mL IVPB  1 g Intravenous Once Rolan Bucco, MD   1 g at 04/19/12 1947  . digoxin (LANOXIN) tablet 0.25 mg  0.25 mg Oral Daily Kavion Mancinas, MD   0.25 mg at 04/20/12 1146  . divalproex (DEPAKOTE) DR tablet 1,000 mg  1,000 mg Oral Daily Kawena Lyday, MD   1,000 mg at 04/20/12 1145  . docusate sodium (COLACE) capsule 100 mg  100 mg Oral BID Yatzil Clippinger, MD   100 mg at 04/19/12 2354  . enoxaparin (LOVENOX) injection 150 mg  150 mg Subcutaneous Q24H Loma Messing [place], PHARMD      . furosemide (LASIX) injection 40 mg  40 mg Intravenous Once Rolan Bucco, MD   40 mg at 04/19/12 1946  . furosemide (LASIX) injection 60 mg  60 mg Intravenous Q12H Chelcea Zahn, MD      . guaiFENesin (MUCINEX)  12 hr tablet 600 mg  600 mg Oral BID Corsica Franson, MD   600 mg at 04/20/12 1145  . insulin aspart (novoLOG) injection 0-20 Units  0-20 Units Subcutaneous TID WC Mead Slane, MD   4 Units at 04/20/12 1318  . insulin aspart (novoLOG) injection 0-5 Units  0-5 Units Subcutaneous QHS Kali Ambler, MD      . insulin glargine (LANTUS) injection 20 Units  20 Units Subcutaneous QHS Iris Tatsch, MD      . lamoTRIgine (LAMICTAL) tablet 100 mg  100 mg Oral Daily Krina Mraz, MD   100 mg at 04/20/12 1145  . levofloxacin (LEVAQUIN) IVPB 750 mg  750 mg Intravenous Q24H Remon Quinto, MD   750 mg at 04/20/12 0117  . metoprolol succinate (TOPROL-XL) 24 hr tablet 50 mg  50 mg Oral Daily Tuleen Mandelbaum, MD   50 mg at 04/20/12 1146  . morphine 4 MG/ML injection 4 mg  4 mg Intravenous Q4H PRN Chantele Corado, MD   4 mg at 04/20/12 1137  . nitroGLYCERIN (NITROSTAT) SL tablet 0.4 mg  0.4 mg Sublingual Q5 Min x 3 PRN Sedra Morfin, MD   0.4 mg at 04/20/12 1137  . ondansetron (ZOFRAN) tablet 4 mg  4 mg Oral  Q6H PRN Javonnie Illescas, MD       Or  . ondansetron (ZOFRAN) injection 4 mg  4 mg Intravenous Q6H PRN Brayton Baumgartner, MD      . oxyCODONE (Oxy IR/ROXICODONE) immediate release tablet 15 mg  15 mg Oral Q6H Lovie Zarling, MD   15 mg at 04/20/12 1317  . pantoprazole (PROTONIX) EC tablet 40 mg  40 mg Oral BID AC Lavonta Tillis, MD      . risperiDONE (RISPERDAL) tablet 2 mg  2 mg Oral QHS Breannah Kratt, MD      . simvastatin (ZOCOR) tablet 20 mg  20 mg Oral q1800 Devaughn Savant, MD      . sodium chloride 0.9 % injection 3 mL  3 mL Intravenous Q12H Honey Zakarian, MD   3 mL at 04/20/12 1147  . sodium chloride 0.9 % injection 3 mL  3 mL Intravenous Q12H Raju Coppolino, MD      . sodium chloride 0.9 % injection 3 mL  3 mL Intravenous PRN Damonta Cossey, MD      . sucralfate (CARAFATE) tablet 1 g  1 g Oral TID WC & HS Calyn Rubi, MD      . DISCONTD: enoxaparin (LOVENOX) injection 280 mg  1 mg/kg Subcutaneous Q12H Lorenza Evangelist, PHARMD   280 mg at 04/19/12 2354  . DISCONTD: furosemide (LASIX) injection 40 mg  40 mg Intravenous Q12H Nancye Grumbine, MD   40 mg at 04/20/12 1147  . DISCONTD: pantoprazole (PROTONIX) EC tablet 40 mg  40 mg Oral Q1200 Samarra Ridgely, MD   40 mg at 04/20/12 1146   Allergies  Allergen Reactions  . Shellfish Allergy Anaphylaxis  . Iodine     Associated with the shellfish allergy   Active Problems:  Chest pain  Morbid obesity with body mass index of 60.0-69.9 in adult  HTN (hypertension), benign  CHF (congestive heart failure)  OSA (obstructive sleep apnea)  A-fib   Vital signs in last 24 hours: Temp:  [97.2 F (36.2 C)-98 F (36.7 C)] 97.7 F (36.5 C) (07/20 1400) Pulse Rate:  [79-104] 84  (07/20 1400) Resp:  [18-31] 22  (07/20 1400) BP: (138-175)/(70-95) 169/70 mmHg (07/20 1400) SpO2:  [89 %-96 %] 93 % (07/20  1400) Weight:  [266.715 kg (588 lb)-277.5 kg (611 lb 12.4 oz)] 274.335 kg (604 lb 12.8 oz) (07/20 0536) Weight change:  Last BM Date:  04/19/12  Intake/Output from previous day: 07/19 0701 - 07/20 0700 In: 150 [IV Piggyback:150] Out: 2750 [Urine:2750] Intake/Output this shift: Total I/O In: 240 [P.O.:240] Out: -   Lab Results:  Basename 04/19/12 1648  WBC 9.3  HGB 13.3  HCT 40.0  PLT 198   BMET  Basename 04/19/12 1648  NA 140  K 4.5  CL 99  CO2 33*  GLUCOSE 218*  BUN 22  CREATININE 1.31  CALCIUM 9.3    Studies/Results: Dg Chest 2 View  04/19/2012  *RADIOLOGY REPORT*  Clinical Data: Shortness of breath.  Chest pain.  CHEST - 2 VIEW  Comparison: Chest x-ray 04/14/2012.  Findings: Lung volumes are normal.  No consolidative airspace disease.  Severe pulmonary venous congestion, but no frank pulmonary edema at this time.  No acute consolidative airspace disease.  No pleural effusions.  Heart size is mildly enlarged (unchanged).  Mediastinal contours are unremarkable.  IMPRESSION: 1.  Mild cardiomegaly with severe pulmonary venous congestion, but no frank pulmonary edema at this time.  Original Report Authenticated By: Florencia Reasons, M.D.    Medications: I have reviewed the patient's current medications.   Physical exam GENERAL- alert HEAD- normal atraumatic, no neck masses, normal thyroid, no jvd RESPIRATORY- appears well, vitals normal, no respiratory distress, acyanotic, normal RR, ear and throat exam is normal, neck free of mass or lymphadenopathy, chest clear, no wheezing, crepitations, rhonchi, normal symmetric air entry CVS- regular rate and rhythm, S1, S2 normal, no murmur, click, rub or gallop ABDOMEN- abdomen is soft without significant tenderness, masses, organomegaly or guarding NEURO- Grossly normal EXTREMITIES- less leg edema.  Plan   .Chest pain- cardiac enzymes negative- pleuritic in nature, diastolic chf element, could not get ct chest due to weight limit. pNA versus cardiac versus GERD. Will continue current mx, Add carafate. Dr Eldridge Dace will graciously see later.  Marland KitchenHTN  (hypertension), benign- Uncontrolled. Increase toprol xl. Marland KitchenCHF (congestive heart failure) grade 2 diastolic- as for chest pain, increase lantus. .OSA (obstructive sleep apnea)- Likely pulmonary htn. has CPAP. Continue.  Marland KitchenA-fib- rate controlled. Resume anticoagulation. Apprecaite pharmacy. Target INR 2-3.     Helma Argyle 04/20/2012 3:24 PM Pager: [phone].

## 2012-04-20 NOTE — Progress Notes (Signed)
Radiology tech called to inform RN that he is unable to perform  CT chest because their highest rated scanner only goes up to 484lbs. Dr. Betti Cruz notified. [name] [place]

## 2012-04-20 NOTE — Progress Notes (Signed)
Pt placed on CPAP in auto-titration mode with 3L O2 bleed in. Pt using FFM and tolerating well for now. RT will continue to monitor.

## 2012-04-20 NOTE — Progress Notes (Addendum)
ANTICOAGULATION CONSULT NOTE - Initial Consult  Pharmacy Consult for Warfarin/Lovenox Indication: A.Fib (ACS ruled out)  Allergies  Allergen Reactions  . Shellfish Allergy Anaphylaxis  . Iodine     Associated with the shellfish allergy    Patient Measurements: Height: 6\' 4"  (193 cm) Weight: 604 lb 12.8 oz (274.335 kg) IBW/kg (Calculated) : 86.8    Vital Signs: Temp: 97.7 F (36.5 C) (07/20 1400) Temp src: Oral (07/20 1400) BP: 169/70 mmHg (07/20 1400) Pulse Rate: 84  (07/20 1400)  Labs:  Basename 04/20/12 1215 04/20/12 0358 04/19/12 2045 04/19/12 1648 04/19/12 1640  HGB -- -- -- 13.3 --  HCT -- -- -- 40.0 --  PLT -- -- -- 198 --  APTT -- 38* -- -- --  LABPROT -- 13.4 -- -- 12.7  INR -- 1.00 -- -- 0.93  HEPARINUNFRC -- -- -- -- --  CREATININE -- -- -- 1.31 --  CKTOTAL 110 98 106 -- --  CKMB 1.7 1.5 1.6 -- --  TROPONINI <0.30 <0.30 <0.30 -- --    Estimated Creatinine Clearance: 169.8 ml/min (by C-G formula based on Cr of 1.31).   Medical History: Past Medical History  Diagnosis Date  . CHF (congestive heart failure)   . HTN (hypertension)   . Ptosis   . Atrial flutter   . Atrial fibrillation   . Morbid obesity   . DM2 (diabetes mellitus, type 2)   . Depression   . Respiratory failure   . Chronic low back pain   . Diabetes mellitus   . Asthma   . Vision abnormalities   . Abscess     right thigh    Medications:  Scheduled:    . albuterol  2.5 mg Nebulization Q6H  . amiodarone  200 mg Oral Daily  . amLODipine  10 mg Oral Daily  . aspirin EC  325 mg Oral Daily  . azithromycin  500 mg Intravenous Once  . buPROPion  300 mg Oral Daily  . cefTRIAXone (ROCEPHIN)  IV  1 g Intravenous Once  . digoxin  0.25 mg Oral Daily  . divalproex  1,000 mg Oral Daily  . docusate sodium  100 mg Oral BID  . enoxaparin (LOVENOX) injection  150 mg Subcutaneous Q24H  . furosemide  40 mg Intravenous Once  . furosemide  60 mg Intravenous Q12H  . guaiFENesin  600 mg Oral  BID  . insulin aspart  0-20 Units Subcutaneous TID WC  . insulin aspart  0-5 Units Subcutaneous QHS  . insulin glargine  20 Units Subcutaneous QHS  . lamoTRIgine  100 mg Oral Daily  . levofloxacin (LEVAQUIN) IV  750 mg Intravenous Q24H  . metoprolol succinate  50 mg Oral Daily  . oxyCODONE  15 mg Oral Q6H  . pantoprazole  40 mg Oral BID AC  . risperiDONE  2 mg Oral QHS  . simvastatin  20 mg Oral q1800  . sodium chloride  3 mL Intravenous Q12H  . sodium chloride  3 mL Intravenous Q12H  . sucralfate  1 g Oral TID WC & HS  . DISCONTD: enoxaparin (LOVENOX) injection  1 mg/kg Subcutaneous Q12H  . DISCONTD: furosemide  40 mg Intravenous Q12H  . DISCONTD: pantoprazole  40 mg Oral Q1200   Infusions:   PRN: sodium chloride, acetaminophen, acetaminophen, albuterol, alum & mag hydroxide-simeth, morphine injection, nitroGLYCERIN, ondansetron (ZOFRAN) IV, ondansetron, sodium chloride  Assessment:  41 yo obese M with history of A.Fib, previous on coumadin but has not been taking since  April (?insurance issues).    Patient admitted with Chest pain and started on treatment dose lovenox for r/o ACS.  CE were negative, spoke with Dr. Venetia Constable who doubts ACS and plan is to decrease lovenox to weight adjust prophylactic dose while INR is below goal.  INR on admission was at baseline.   Hgb stable, renal function wnl  PTA (last in April) Patient was taking 10 MWF/Sun and 15 mg on Tues/Thur/Sat  DI: Amiodarone (Can cause increase in INR)  Goal of Therapy:  INR 2-3 Monitor platelets by anticoagulation protocol: Yes   Plan:  1.) Decrease lovenox to 150 mg SQ Q24hr 2.) Coumadin 15 mg po x 1 at 1800 3.) Daily PT/INR  Yulieth Carrender, Loma Messing PharmD 3:44 PM 04/20/2012

## 2012-04-20 NOTE — Progress Notes (Addendum)
Pt complaining of chest pain similar to chest pain on arrival.  Pain 9/10 in the middle part of chest radiating to the back area.  Checked vitals, gave SL 0.4 Nitro at 1115.  ECG, NSR.  After 15 minutes, patient pain 8/10.  Gave second SL Nitro at 1137.  At 1200 patient states pain a 6/10.  Hospitalist aware, consulting cardiology.

## 2012-04-20 NOTE — Consult Note (Signed)
Admit date: 04/19/2012 Referring Physician  Dr. Venetia Constable Primary Cardiologist  Dr. Jens Som Reason for Consultation  chest pain  HPI: 41 year old man with morbid obesity who has been having chest pain intermittent for the past 2 weeks.  He describes it as severe.  It ranges from 6/10-8/10.  He states it is worse when he takes a deep breath in or exhales.  It is worse when he coughs as well.  He tries to walk for about 20 minutes every morning at his apartment complex.  He has not noticed any chest discomfort with activity.  He has backed off on the walking due to the chest pain.  While in the hospital, he continues to have some chest pain at rest.  It is improved with morphine.  It is not gone away completely at this time but is down to a 6 after his most recent dose of morphine.  He does not report any sweating, palpitations, lightheadedness or syncope.  He does have shortness of breath when he lies flat on his back but this is chronic and he thinks due to his weight.  He also reports suffering from depression which sometimes causes him to lie in bed for prolonged periods of time.     PMH:   Past Medical History  Diagnosis Date  . CHF (congestive heart failure)   . HTN (hypertension)   . Ptosis   . Atrial flutter   . Atrial fibrillation   . Morbid obesity   . DM2 (diabetes mellitus, type 2)   . Depression   . Respiratory failure   . Chronic low back pain   . Diabetes mellitus   . Asthma   . Vision abnormalities   . Abscess     right thigh     PSH:   Past Surgical History  Procedure Date  . Tonsillectomy   . Cholecystectomy   . Tonsillectomy   . Irrigation and debridement abscess 10/13/2011    Procedure: IRRIGATION AND DEBRIDEMENT ABSCESS;  Surgeon: Emelia Loron, MD;  Location: [facility] OR;  Service: General;  Laterality: Right;  Irrigation and debridement right thigh abscess  . Application of wound vac     right thigh abscess    Allergies:  Shellfish allergy and Iodine Prior to  Admit Meds:   Prescriptions prior to admission  Medication Sig Dispense Refill  . albuterol (PROVENTIL) (2.5 MG/3ML) 0.083% nebulizer solution Take 2.5 mg by nebulization once.      Marland Kitchen albuterol (VENTOLIN HFA) 108 (90 BASE) MCG/ACT inhaler Inhale 2 puffs into the lungs every 6 (six) hours as needed. Shortness of breath      . amiodarone (PACERONE) 200 MG tablet Take 200 mg by mouth daily.      Marland Kitchen amLODipine (NORVASC) 10 MG tablet daily.      Marland Kitchen aspirin 81 MG chewable tablet Chew 324 mg by mouth once.      Marland Kitchen buPROPion (WELLBUTRIN XL) 300 MG 24 hr tablet Take 1 tablet by mouth Daily.      . digoxin (LANOXIN) 0.25 MG tablet Take 1 tablet (0.25 mg total) by mouth daily.  30 tablet  3  . divalproex (DEPAKOTE) 500 MG DR tablet Take 1,000 mg by mouth daily.      Marland Kitchen doxycycline (VIBRAMYCIN) 100 MG capsule Take 1 capsule (100 mg total) by mouth 2 (two) times daily.  14 capsule  0  . HUMALOG MIX 75/25 (75-25) 100 UNIT/ML SUSP Inject 20-75 Units into the skin Three times a day before meals. Takes  75 units before breakfast Takes 20 units before lunch Takes 65 units before supper      . lamoTRIgine (LAMICTAL) 100 MG tablet Take 100 mg by mouth daily.       . metoprolol (TOPROL-XL) 50 MG 24 hr tablet Take 50 mg by mouth daily.      . nitroGLYCERIN (NITROSTAT) 0.4 MG SL tablet Place 0.4 mg under the tongue once.      Marland Kitchen oxyCODONE (ROXICODONE) 15 MG immediate release tablet Take 15 mg by mouth every 6 (six) hours. For pain      . risperiDONE (RISPERDAL) 2 MG tablet Take 1 tablet by mouth Daily.       Fam HX:    Family History  Problem Relation Age of Onset  . Coronary artery disease Mother     35s; father had it in 42s; both deceased in their 83s    . Cancer Mother     liver  . Heart disease Father   . Cancer Father     prostate   Social HX:    History   Social History  . Marital Status: Divorced    Spouse Name: N/A    Number of Children: N/A  . Years of Education: N/A   Occupational History  .  Not on file.   Social History Main Topics  . Smoking status: Never Smoker   . Smokeless tobacco: Not on file   Comment: no significant tobacco use   . Alcohol Use: Yes     no significant use  . Drug Use: No  . Sexually Active: Not on file   Other Topics Concern  . Not on file   Social History Narrative   Lives in [place]; works full time at [employer]. Occasionally takes echinacea and B12 supplements. Regular diet; has not exercised regularly over the last 3 months.      ROS:  All 11 ROS were addressed and are negative except what is stated in the HPI  Physical Exam: Blood pressure 169/70, pulse 84, temperature 97.7 F (36.5 C), temperature source Oral, resp. rate 22, height 6\' 4"  (1.93 m), weight 274.335 kg (604 lb 12.8 oz), SpO2 94.00%.    General: Well developed, well nourished, in no acute distress Head:    Normal cephalic and atramatic  Lungs:   Scant basilar crackles Heart:   HRRR S1 S2 Abdomen: Morbidly obese  Extremities:   1+ pedal edema.   Neuro: Alert and oriented X 3. Psych:  Flat affect, responds appropriately    Labs:   Lab Results  Component Value Date   WBC 9.3 04/19/2012   HGB 13.3 04/19/2012   HCT 40.0 04/19/2012   MCV 84.6 04/19/2012   PLT 198 04/19/2012    Lab 04/19/12 1648  NA 140  K 4.5  CL 99  CO2 33*  BUN 22  CREATININE 1.31  CALCIUM 9.3  PROT --  BILITOT --  ALKPHOS --  ALT --  AST --  GLUCOSE 218*   No results found for this basename: PTT   Lab Results  Component Value Date   INR 1.00 04/20/2012   INR 0.93 04/19/2012   INR 1.04 04/14/2012   Lab Results  Component Value Date   CKTOTAL 110 04/20/2012   CKMB 1.7 04/20/2012   TROPONINI <0.30 04/20/2012     Lab Results  Component Value Date   CHOL 164 04/20/2012   CHOL  Value: 156        ATP III CLASSIFICATION:  <200  mg/dL   Desirable  956-213  mg/dL   Borderline High  >=086    mg/dL   High        5/78/4696   CHOL  Value: 142        ATP III CLASSIFICATION:  <200     mg/dL    Desirable  295-284  mg/dL   Borderline High  >=132    mg/dL   High        4/40/1027   Lab Results  Component Value Date   HDL 55 04/20/2012   HDL 40 2/53/6644   HDL 30* 12/30/2009   Lab Results  Component Value Date   LDLCALC 88 04/20/2012   LDLCALC  Value: 81        Total Cholesterol/HDL:CHD Risk Coronary Heart Disease Risk Table                     Men   Women  1/2 Average Risk   3.4   3.3  Average Risk       5.0   4.4  2 X Average Risk   9.6   7.1  3 X Average Risk  23.4   11.0        Use the calculated Patient Ratio above and the CHD Risk Table to determine the patient's CHD Risk.        ATP III CLASSIFICATION (LDL):  <100     mg/dL   Optimal  034-742  mg/dL   Near or Above                    Optimal  130-159  mg/dL   Borderline  595-638  mg/dL   High  >756     mg/dL   Very High 4/33/2951   LDLCALC  Value: 83        Total Cholesterol/HDL:CHD Risk Coronary Heart Disease Risk Table                     Men   Women  1/2 Average Risk   3.4   3.3  Average Risk       5.0   4.4  2 X Average Risk   9.6   7.1  3 X Average Risk  23.4   11.0        Use the calculated Patient Ratio above and the CHD Risk Table to determine the patient's CHD Risk.        ATP III CLASSIFICATION (LDL):  <100     mg/dL   Optimal  884-166  mg/dL   Near or Above                    Optimal  130-159  mg/dL   Borderline  063-016  mg/dL   High  >010     mg/dL   Very High 9/32/3557   Lab Results  Component Value Date   TRIG 105 04/20/2012   TRIG 173* 04/20/2010   TRIG 145 12/30/2009   Lab Results  Component Value Date   CHOLHDL 3.0 04/20/2012   CHOLHDL 3.9 04/20/2010   CHOLHDL 4.7 12/30/2009   No results found for this basename: LDLDIRECT      Radiology:  Dg Chest 2 View  04/19/2012  *RADIOLOGY REPORT*  Clinical Data: Shortness of breath.  Chest pain.  CHEST - 2 VIEW  Comparison: Chest x-ray 04/14/2012.  Findings: Lung volumes are normal.  No consolidative airspace disease.  Severe pulmonary venous  congestion, but no frank  pulmonary edema at this time.  No acute consolidative airspace disease.  No pleural effusions.  Heart size is mildly enlarged (unchanged).  Mediastinal contours are unremarkable.  IMPRESSION: 1.  Mild cardiomegaly with severe pulmonary venous congestion, but no frank pulmonary edema at this time.  Original Report Authenticated By: Florencia Reasons, M.D.    EKG:  Normal sinus rhythm, no ST segment changes, poor R wave progression  ASSESSMENT: Atypical chest pain for a prolonged period of time.  PLAN:  He has ruled out for MI despite a long period of chest pain.  No significant ECG changes for acute ischemia.  Regardless, there is no modality in [place] to evaluate him for ischemia.  His pain is more pleuritic in nature.  It is worse with deep breathing and coughing.  It is not related to exertion.  Diastolic heart failure.  This seems to be an issue as he is reporting fluid overload in his legs.  He cannot put on his shoes due to his feet being swollen.  Lasix has been held since April due to his renal issues.  Continue diuresis with close followup of his renal function.  Fluid overload may be contributing somewhat to his symptoms as well.  Morbid obesity we spoke about weight loss surgery options.  He is thought about this in the past.  He does state that he is trying to be active and watching his diet to try and lose weight.  Corky Crafts., MD  04/20/2012  6:26 PM

## 2012-04-21 DIAGNOSIS — I509 Heart failure, unspecified: Secondary | ICD-10-CM

## 2012-04-21 LAB — PROTIME-INR: INR: 0.98 (ref 0.00–1.49)

## 2012-04-21 LAB — COMPREHENSIVE METABOLIC PANEL
ALT: 9 U/L (ref 0–53)
AST: 17 U/L (ref 0–37)
Albumin: 3.5 g/dL (ref 3.5–5.2)
CO2: 33 mEq/L — ABNORMAL HIGH (ref 19–32)
Calcium: 9.2 mg/dL (ref 8.4–10.5)
Creatinine, Ser: 1.06 mg/dL (ref 0.50–1.35)
GFR calc non Af Amer: 86 mL/min — ABNORMAL LOW (ref >90)
Sodium: 136 mEq/L (ref 135–145)

## 2012-04-21 LAB — CBC
MCH: 28.3 pg (ref 26.0–34.0)
MCV: 83 fL (ref 78.0–100.0)
Platelets: 192 10*3/uL (ref 150–400)
RBC: 4.77 MIL/uL (ref 4.22–5.81)
RDW: 15.1 % (ref 11.5–15.5)
WBC: 9.7 10*3/uL (ref 4.0–10.5)

## 2012-04-21 LAB — GLUCOSE, CAPILLARY: Glucose-Capillary: 159 mg/dL — ABNORMAL HIGH (ref 70–99)

## 2012-04-21 LAB — URINE CULTURE: Colony Count: NO GROWTH

## 2012-04-21 LAB — EXPECTORATED SPUTUM ASSESSMENT W GRAM STAIN, RFLX TO RESP C

## 2012-04-21 MED ORDER — WARFARIN SODIUM 10 MG PO TABS
20.0000 mg | ORAL_TABLET | Freq: Once | ORAL | Status: AC
  Filled 2012-04-21: qty 2

## 2012-04-21 NOTE — Progress Notes (Addendum)
ANTICOAGULATION CONSULT NOTE - Initial Consult  Pharmacy Consult for Warfarin/Lovenox Indication: A.Fib (ACS ruled out)  Allergies  Allergen Reactions  . Shellfish Allergy Anaphylaxis  . Iodine     Associated with the shellfish allergy    Patient Measurements: Height: 6\' 4"  (193 cm) Weight: 595 lb 0.3 oz (269.9 kg) IBW/kg (Calculated) : 86.8    Vital Signs: Temp: 97.6 F (36.4 C) (07/21 0538) Temp src: Oral (07/21 0538) BP: 118/80 mmHg (07/21 0538) Pulse Rate: 80  (07/21 0538)  Labs:  Alvira Philips 04/21/12 0659 04/20/12 1215 04/20/12 0358 04/19/12 2045 04/19/12 1648 04/19/12 1640  HGB 13.5 -- -- -- 13.3 --  HCT 39.6 -- -- -- 40.0 --  PLT 192 -- -- -- 198 --  APTT -- -- 38* -- -- --  LABPROT 13.2 -- 13.4 -- -- 12.7  INR 0.98 -- 1.00 -- -- 0.93  HEPARINUNFRC -- -- -- -- -- --  CREATININE 1.06 -- -- -- 1.31 --  CKTOTAL -- 110 98 106 -- --  CKMB -- 1.7 1.5 1.6 -- --  TROPONINI -- <0.30 <0.30 <0.30 -- --    Estimated Creatinine Clearance: 207.5 ml/min (by C-G formula based on Cr of 1.06).   Medical History: Past Medical History  Diagnosis Date  . CHF (congestive heart failure)   . HTN (hypertension)   . Ptosis   . Atrial flutter   . Atrial fibrillation   . Morbid obesity   . DM2 (diabetes mellitus, type 2)   . Depression   . Respiratory failure   . Chronic low back pain   . Diabetes mellitus   . Asthma   . Vision abnormalities   . Abscess     right thigh    Medications:  Scheduled:     . albuterol  2.5 mg Nebulization Q6H  . amiodarone  200 mg Oral Daily  . amLODipine  10 mg Oral Daily  . aspirin EC  325 mg Oral Daily  . buPROPion  300 mg Oral Daily  . digoxin  0.25 mg Oral Daily  . divalproex  1,000 mg Oral Daily  . docusate sodium  100 mg Oral BID  . enoxaparin (LOVENOX) injection  150 mg Subcutaneous Q24H  . furosemide  60 mg Intravenous Q12H  . guaiFENesin  600 mg Oral BID  . insulin aspart  0-20 Units Subcutaneous TID WC  . insulin aspart   0-5 Units Subcutaneous QHS  . insulin glargine  20 Units Subcutaneous QHS  . lamoTRIgine  100 mg Oral Daily  . levofloxacin (LEVAQUIN) IV  750 mg Intravenous Q24H  . metoprolol succinate  75 mg Oral Daily  . oxyCODONE  15 mg Oral Q6H  . pantoprazole  40 mg Oral BID AC  . risperiDONE  2 mg Oral QHS  . simvastatin  20 mg Oral q1800  . sodium chloride  3 mL Intravenous Q12H  . sodium chloride  3 mL Intravenous Q12H  . sucralfate  1 g Oral TID WC & HS  . warfarin  15 mg Oral ONCE-1800  . Warfarin - Pharmacist Dosing Inpatient   Does not apply q1800  . DISCONTD: enoxaparin (LOVENOX) injection  1 mg/kg Subcutaneous Q12H  . DISCONTD: furosemide  40 mg Intravenous Q12H  . DISCONTD: metoprolol succinate  50 mg Oral Daily  . DISCONTD: pantoprazole  40 mg Oral Q1200   Infusions:   PRN: sodium chloride, acetaminophen, acetaminophen, albuterol, alum & mag hydroxide-simeth, morphine injection, nitroGLYCERIN, ondansetron (ZOFRAN) IV, ondansetron, sodium chloride  Assessment:  41 yo obese M with history of A.Fib, previous on coumadin but has not been taking since April (?insurance issues).    Patient admitted with Chest pain and started on treatment dose lovenox for r/o ACS.  CE were negative, spoke with Dr. Venetia Constable who doubts ACS and plan is to decrease lovenox to weight adjust prophylactic dose while INR is below goal.  INR on admission was at baseline and decreased to 0.98 today after 1 dose 15 mg.   Hgb stable, renal function wnl  PTA (last in April) Patient was taking 10 MWF/Sun and 15 mg on Tues/Thur/Sat  DI: Amiodarone/Levaquin (Can cause increase in INR)  Goal of Therapy:  INR 2-3 Monitor platelets by anticoagulation protocol: Yes   Plan:  1.) Continue lovenox to 150 mg SQ Q24hr 2.) Coumadin 20 mg po x 1 at 1800 3.) Daily PT/INR  Zierra Laroque, Loma Messing PharmD 1:46 PM 04/21/2012

## 2012-04-21 NOTE — Progress Notes (Signed)
New sputum culture needed.

## 2012-04-22 DIAGNOSIS — N179 Acute kidney failure, unspecified: Secondary | ICD-10-CM

## 2012-04-22 DIAGNOSIS — J9600 Acute respiratory failure, unspecified whether with hypoxia or hypercapnia: Secondary | ICD-10-CM

## 2012-04-22 DIAGNOSIS — I4891 Unspecified atrial fibrillation: Secondary | ICD-10-CM

## 2012-04-22 LAB — BASIC METABOLIC PANEL
BUN: 31 mg/dL — ABNORMAL HIGH (ref 6–23)
Chloride: 93 mEq/L — ABNORMAL LOW (ref 96–112)
GFR calc Af Amer: 82 mL/min — ABNORMAL LOW (ref >90)
GFR calc non Af Amer: 71 mL/min — ABNORMAL LOW (ref >90)
Potassium: 4.7 mEq/L (ref 3.5–5.1)
Sodium: 135 mEq/L (ref 135–145)

## 2012-04-22 LAB — GLUCOSE, CAPILLARY
Glucose-Capillary: 146 mg/dL — ABNORMAL HIGH (ref 70–99)
Glucose-Capillary: 191 mg/dL — ABNORMAL HIGH (ref 70–99)
Glucose-Capillary: 143 mg/dL — ABNORMAL HIGH (ref 70–99)
Glucose-Capillary: 141 mg/dL — ABNORMAL HIGH (ref 70–99)
Glucose-Capillary: 172 mg/dL — ABNORMAL HIGH (ref 70–99)

## 2012-04-22 MED ORDER — ENOXAPARIN SODIUM 150 MG/ML ~~LOC~~ SOLN
135.0000 mg | SUBCUTANEOUS | Status: DC
  Administered 2012-04-22 – 2012-04-24 (×3): 135 mg via SUBCUTANEOUS
  Filled 2012-04-22 (×4): qty 1

## 2012-04-22 MED ORDER — SUCRALFATE 1 G PO TABS: 1.0000 g | ORAL_TABLET | Freq: Three times a day (TID) | ORAL | Status: DC

## 2012-04-22 MED ORDER — LEVOFLOXACIN 750 MG PO TABS
750.0000 mg | ORAL_TABLET | Freq: Every day | ORAL | Status: DC
  Administered 2012-04-22 – 2012-04-23 (×2): 750 mg via ORAL
  Filled 2012-04-22 (×3): qty 1

## 2012-04-22 MED ORDER — ASPIRIN EC 81 MG PO TBEC
81.0000 mg | DELAYED_RELEASE_TABLET | Freq: Every day | ORAL | Status: DC
  Administered 2012-04-23 – 2012-04-25 (×3): 81 mg via ORAL
  Filled 2012-04-22 (×3): qty 1

## 2012-04-22 MED ORDER — WARFARIN SODIUM 10 MG PO TABS
20.0000 mg | ORAL_TABLET | Freq: Once | ORAL | Status: AC
  Administered 2012-04-22: 20 mg via ORAL
  Filled 2012-04-22: qty 2

## 2012-04-22 NOTE — Progress Notes (Signed)
PT refused BiPAP. He was asked several times. PT was informed that if he changed his mind to call and I would come place his mask on him

## 2012-04-22 NOTE — Progress Notes (Signed)
Subjective:  Still having chest pain worse with twisting his body or deep breath.  Objective:  Vital Signs in the last 24 hours: Temp:  [97.6 F (36.4 C)-98.1 F (36.7 C)] 97.6 F (36.4 C) (07/22 0625) Pulse Rate:  [74-89] 74  (07/22 0625) Resp:  [20] 20  (07/22 0625) BP: (109-143)/(77-84) 109/77 mmHg (07/22 0625) SpO2:  [89 %-97 %] 97 % (07/22 0625) Weight:  [592 lb 2.5 oz (268.6 kg)] 592 lb 2.5 oz (268.6 kg) (07/22 0625)  Intake/Output from previous day: 07/21 0701 - 07/22 0700 In: 600 [P.O.:600] Out: 800 [Urine:800] Intake/Output from this shift:       . albuterol  2.5 mg Nebulization Q6H  . amiodarone  200 mg Oral Daily  . amLODipine  10 mg Oral Daily  . aspirin EC  325 mg Oral Daily  . buPROPion  300 mg Oral Daily  . digoxin  0.25 mg Oral Daily  . divalproex  1,000 mg Oral Daily  . docusate sodium  100 mg Oral BID  . enoxaparin (LOVENOX) injection  150 mg Subcutaneous Q24H  . furosemide  60 mg Intravenous Q12H  . guaiFENesin  600 mg Oral BID  . insulin aspart  0-20 Units Subcutaneous TID WC  . insulin aspart  0-5 Units Subcutaneous QHS  . insulin glargine  20 Units Subcutaneous QHS  . lamoTRIgine  100 mg Oral Daily  . levofloxacin (LEVAQUIN) IV  750 mg Intravenous Q24H  . metoprolol succinate  75 mg Oral Daily  . oxyCODONE  15 mg Oral Q6H  . pantoprazole  40 mg Oral BID AC  . risperiDONE  2 mg Oral QHS  . simvastatin  20 mg Oral q1800  . sodium chloride  3 mL Intravenous Q12H  . sodium chloride  3 mL Intravenous Q12H  . sucralfate  1 g Oral TID WC & HS  . warfarin  20 mg Oral ONCE-1800  . Warfarin - Pharmacist Dosing Inpatient   Does not apply q1800      Physical Exam: The patient appears to be in no distress.  Morbid obesity.  Head and neck exam reveals that the pupils are equal and reactive.  The extraocular movements are full.  There is no scleral icterus.  Mouth and pharynx are benign.  No lymphadenopathy.  No carotid bruits.  The jugular venous  pressure is difficult to see.  Thyroid is not enlarged or tender.  Chest is clear to percussion and auscultation.  No rales or rhonchi.  Expansion of the chest is symmetrical.  Heart reveals no abnormal lift or heave.  First and second heart sounds are normal.  There is no murmur gallop rub or click. No chest wall tenderness.  Abdomen is morbidly obese.  Extremities reveal mild edema.   Neurologic exam is normal strength and no lateralizing weakness.  No sensory deficits.  Integument reveals no rash      Lab Results:  Basename 04/21/12 0659 04/19/12 1648  WBC 9.7 9.3  HGB 13.5 13.3  PLT 192 198    Basename 04/21/12 0659 04/19/12 1648  NA 136 140  K 4.3 4.5  CL 94* 99  CO2 33* 33*  GLUCOSE 177* 218*  BUN 26* 22  CREATININE 1.06 1.31    Basename 04/20/12 1215 04/20/12 0358  TROPONINI <0.30 <0.30   Hepatic Function Panel  Basename 04/21/12 0659  PROT 7.8  ALBUMIN 3.5  AST 17  ALT 9  ALKPHOS 64  BILITOT 0.5  BILIDIR --  IBILI --  Basename 04/20/12 0358  CHOL 164   No results found for this basename: PROTIME in the last 72 hours  Imaging: No results found.  Cardiac Studies:  Assessment/Plan:  Patient Active Hospital Problem List: Chest pain (04/19/2012)   Assessment: No evidence of ischemia or MI   Plan: Mild analgesics Morbid obesity with body mass index of 60.0-69.9 in adult (04/19/2012)   Assessment: Weight loss in hospital reflects effect of diuresis   Plan: Strict diet going forward. Possible consideration of weight loss surgery in future.  CHF (congestive heart failure) (04/19/2012)   Assessment: Diastolic HF   Plan: Continue lasix, follow BMETs * A-fib (04/19/2012)   Assessment: Maintaining NSR   Plan: Continue amiodarone, warfarin.   LOS: 3 days    Cassell Clement 04/22/2012, 7:14 AM

## 2012-04-22 NOTE — Care Management Note (Unsigned)
    Page 1 of 1   04/25/2012     1:10:03 PM   CARE MANAGEMENT NOTE 04/25/2012  Patient:  ODARIUS, DINES   Account Number:  [id number]  Date Initiated:  04/22/2012  Documentation initiated by:  Lanier Clam  Subjective/Objective Assessment:   ADMITTED W/CHEST PAIN.HX:CHF,HTN,AFIB,MORBID OBESITY     Action/Plan:   FROM HOME ALONE   Anticipated DC Date:  04/25/2012   Anticipated DC Plan:  HOME/SELF CARE      DC Planning Services  CM consult  Outpatient Services - Pt will follow up      Choice offered to / List presented to:             Status of service:  In process, will continue to follow Medicare Important Message given?   (If response is "NO", the following Medicare IM given date fields will be blank) Date Medicare IM given:   Date Additional Medicare IM given:    Discharge Disposition:    Per UR Regulation:  Reviewed for med. necessity/level of care/duration of stay  If discussed at Long Length of Stay Meetings, dates discussed:    Comments:  04/25/12 Cami Delawder RN,BSN NCM [phone] PT-OUTPATIENT PT.IF MD AGREE PATIENT WILL NEED SCRIPT FOR OTPT SERVICES.I HAVE PROVIDED PATIENT W/OUPATIENT FACILITIES AS A RESOURCE.PATIENT ALREADY USES [facility],& HAS TRAVEL IF NEEDED,HAS OWN TRANSPORTATION HOME.  04/22/12 Vali Capano RN,BSN NCM [phone] CARDIO FOLLOWING.

## 2012-04-22 NOTE — Progress Notes (Signed)
PHARMACIST - PHYSICIAN COMMUNICATION DR:   Butler Denmark CONCERNING: Antibiotic IV to Oral Route Change Policy  RECOMMENDATION: This patient is receiving Levaquin by the intravenous route.  Based on criteria approved by the Pharmacy and Therapeutics Committee, the antibiotic(s) is/are being converted to the equivalent oral dose form(s).   DESCRIPTION: These criteria include:  Patient being treated for a respiratory tract infection, urinary tract infection, or cellulitis  The patient is not neutropenic and does not exhibit a GI malabsorption state  The patient is eating (either orally or via tube) and/or has been taking other orally administered medications for a least 24 hours  The patient is improving clinically and has a Tmax < 100.5  If you have questions about this conversion, please contact the Pharmacy Department   [phone] )  [facility].  Initial stop date was maintained with plan for 7 days of therapy.  Last dose to be given 04/25/12.  Lynann Beaver PharmD, BCPS Pager [phone] 04/22/2012 2:50 PM

## 2012-04-22 NOTE — Progress Notes (Signed)
ANTICOAGULATION CONSULT NOTE   Pharmacy Consult for Warfarin/Lovenox Indication: A.Fib   Allergies  Allergen Reactions  . Shellfish Allergy Anaphylaxis  . Iodine     Associated with the shellfish allergy    Patient Measurements: Height: 6\' 4"  (193 cm) Weight: 592 lb 2.5 oz (268.6 kg) IBW/kg (Calculated) : 86.8    Vital Signs: Temp: 97.6 F (36.4 C) (07/22 0625) Temp src: Oral (07/22 0625) BP: 109/77 mmHg (07/22 0625) Pulse Rate: 74  (07/22 0625)  Labs:  Alvira Philips 04/22/12 0454 04/21/12 0659 04/20/12 1215 04/20/12 0358 04/19/12 2045 04/19/12 1648  HGB -- 13.5 -- -- -- 13.3  HCT -- 39.6 -- -- -- 40.0  PLT -- 192 -- -- -- 198  APTT -- -- -- 38* -- --  LABPROT 14.5 13.2 -- 13.4 -- --  INR 1.11 0.98 -- 1.00 -- --  HEPARINUNFRC -- -- -- -- -- --  CREATININE 1.24 1.06 -- -- -- 1.31  CKTOTAL -- -- 110 98 106 --  CKMB -- -- 1.7 1.5 1.6 --  TROPONINI -- -- <0.30 <0.30 <0.30 --    Estimated Creatinine Clearance: 176.9 ml/min (by C-G formula based on Cr of 1.24).   Medications:  Scheduled:     . albuterol  2.5 mg Nebulization Q6H  . amiodarone  200 mg Oral Daily  . amLODipine  10 mg Oral Daily  . aspirin EC  325 mg Oral Daily  . buPROPion  300 mg Oral Daily  . digoxin  0.25 mg Oral Daily  . divalproex  1,000 mg Oral Daily  . docusate sodium  100 mg Oral BID  . enoxaparin (LOVENOX) injection  150 mg Subcutaneous Q24H  . furosemide  60 mg Intravenous Q12H  . guaiFENesin  600 mg Oral BID  . insulin aspart  0-20 Units Subcutaneous TID WC  . insulin aspart  0-5 Units Subcutaneous QHS  . insulin glargine  20 Units Subcutaneous QHS  . lamoTRIgine  100 mg Oral Daily  . levofloxacin (LEVAQUIN) IV  750 mg Intravenous Q24H  . metoprolol succinate  75 mg Oral Daily  . oxyCODONE  15 mg Oral Q6H  . pantoprazole  40 mg Oral BID AC  . risperiDONE  2 mg Oral QHS  . simvastatin  20 mg Oral q1800  . sodium chloride  3 mL Intravenous Q12H  . sodium chloride  3 mL Intravenous Q12H   . sucralfate  1 g Oral TID WC & HS  . warfarin  20 mg Oral ONCE-1800  . Warfarin - Pharmacist Dosing Inpatient   Does not apply q1800   Assessment:  41 yo obese M with history of A.Fib, previous on coumadin but has not been taking since April (?insurance issues).    Patient admitted with Chest pain and started on treatment dose lovenox for r/o ACS.  CEs were negative, spoke with Dr. Venetia Constable who doubts ACS and plan is to decrease lovenox to weight adjust prophylactic dose while INR is below goal.  INR on admission was at baseline   Weight has decreased (diuresis) and Lovenox dose will be adjusted 7/22  INR increased slightly after 15mg  dose 7/20 and 20mg  dose 7/21.    Hgb stable, renal function wnl  PTA (April) Patient was taking 10 MWF/Sun and 15 mg on Tues/Thur/Sat  DI: Amiodarone/Levaquin (Can cause increase in INR)  Goal of Therapy:  INR 2-3 Monitor platelets by anticoagulation protocol: Yes   Plan:   Decrease to Lovenox to 135 mg SQ Q24hr  Repeat warfarin  20 mg PO x 1 at 1800  Daily PT/INR   Lynann Beaver PharmD, BCPS Pager [phone] 04/22/2012 11:14 AM

## 2012-04-22 NOTE — Progress Notes (Signed)
[facility] PROGRESS NOTE  [name] ZOX:[id number] DOB: Dec 27, 1970 DOA: 04/19/2012 PCP: Emeterio Reeve, MD  Assessment/Plan: Active Problems:  Chest pain No possible that is related to GERD/GAstritis, add Carafate. Already on BID PPI. Advised to sleep partially sitting up and to avoid caffeine, spicy and oily foods.    Acute decompensation of grade 2 diastolic dysfunction- apapreciate cardiology. Patient not candidate for further cardiac work up due to weight limits. Monitor renal function, hopefully transition to oral Lasix in next day or 2. Add SCDs, teds, keep feet elevated.    Morbid obesity(BMI>40)  /diabetes mellitus/  osa on cpap  htn  Afib- on lovenox/Coumadin- target inr 2-3. Appreciate pharmacy  HPI/Subjective: Stating he is have pain up and down chest (points to substernal area). Admits it is worse when laying flat. Admits to GERD. No h/o hiatal hernia or past PUD.  Feels great that he has lost weight but ankles still swollen.   Objective: Filed Vitals:   04/22/12 0921 04/22/12 1342 04/22/12 1434 04/22/12 2013  BP:   143/77   Pulse:   79   Temp:   97.4 F (36.3 C)   TempSrc:   Oral   Resp:   18   Height:      Weight:      SpO2: 90% 93% 92% 98%    Intake/Output Summary (Last 24 hours) at 04/22/12 2146 Last data filed at 04/22/12 1434  Gross per 24 hour  Intake    480 ml  Output    800 ml  Net   -320 ml    Exam:   General:  Morbidly obese, no distress, sitting in chair  Cardiovascular: unable to auscultate  Respiratory: CTA b/l  Abdomen: obese, soft, tender in epigastrium  Ext:b/l edema  Data Reviewed: Basic Metabolic Panel:  Lab 04/22/12 8119 04/21/12 0659 04/20/12 0358 04/19/12 1648  NA 135 136 -- 140  K 4.7 4.3 -- 4.5  CL 93* 94* -- 99  CO2 29 33* -- 33*  GLUCOSE 148* 177* -- 218*  BUN 31* 26* -- 22  CREATININE 1.24 1.06 -- 1.31  CALCIUM 9.2 9.2 -- 9.3  MG -- -- 2.0 --  PHOS -- -- 4.9* --   Liver Function  Tests:  Lab 04/21/12 0659  AST 17  ALT 9  ALKPHOS 64  BILITOT 0.5  PROT 7.8  ALBUMIN 3.5   No results found for this basename: LIPASE:5,AMYLASE:5 in the last 168 hours No results found for this basename: AMMONIA:5 in the last 168 hours CBC:  Lab 04/21/12 0659 04/19/12 1648  WBC 9.7 9.3  NEUTROABS -- 5.7  HGB 13.5 13.3  HCT 39.6 40.0  MCV 83.0 84.6  PLT 192 198   Cardiac Enzymes:  Lab 04/20/12 1215 04/20/12 0358 04/19/12 2045  CKTOTAL 110 98 106  CKMB 1.7 1.5 1.6  CKMBINDEX -- -- --  TROPONINI <0.30 <0.30 <0.30   BNP (last 3 results)  Basename 04/19/12 1648  PROBNP 83.1   CBG:  Lab 04/22/12 2049 04/22/12 1724 04/22/12 1144 04/22/12 0744 04/21/12 2159  GLUCAP 195* 172* 141* 143* 191*    Recent Results (from the past 240 hour(s))  URINE CULTURE     Status: Normal   Collection Time   04/20/12  2:37 AM      Component Value Range Status Comment   Specimen Description URINE, CLEAN CATCH   Final    Special Requests NONE   Final    Culture  Setup Time 04/20/2012 11:34  Final    Colony Count NO GROWTH   Final    Culture NO GROWTH   Final    Report Status 04/21/2012 FINAL   Final   CULTURE, EXPECTORATED SPUTUM-ASSESSMENT     Status: Normal   Collection Time   04/21/12  4:57 AM      Component Value Range Status Comment   Specimen Description SPUTUM   Final    Special Requests NONE   Final    Sputum evaluation     Final    Value: MICROSCOPIC FINDINGS SUGGEST THAT THIS SPECIMEN IS NOT REPRESENTATIVE OF LOWER RESPIRATORY SECRETIONS. PLEASE RECOLLECT.     CALLED TO DEBBIE/RN AT 0555 04/21/2012 BY R LINEBERRY   Report Status 04/21/2012 FINAL   Final      Studies: Dg Chest 2 View  04/19/2012  *RADIOLOGY REPORT*  Clinical Data: Shortness of breath.  Chest pain.  CHEST - 2 VIEW  Comparison: Chest x-ray 04/14/2012.  Findings: Lung volumes are normal.  No consolidative airspace disease.  Severe pulmonary venous congestion, but no frank pulmonary edema at this time.  No  acute consolidative airspace disease.  No pleural effusions.  Heart size is mildly enlarged (unchanged).  Mediastinal contours are unremarkable.  IMPRESSION: 1.  Mild cardiomegaly with severe pulmonary venous congestion, but no frank pulmonary edema at this time.  Original Report Authenticated By: Florencia Reasons, M.D.   Dg Chest Portable 1 View  04/14/2012  *RADIOLOGY REPORT*  Clinical Data: Chest pain.  Shortness of breath.  Cough.  PORTABLE CHEST - 1 VIEW  Comparison: 03/08/2011 and 11/09/2009  Findings: Cardiomegaly stable.  Mild elevation of the left hemidiaphragm is seen with mild left basilar atelectasis. Pulmonary vascular congestion is stable, and there is no evidence of frank pulmonary edema or acute infiltrate.  IMPRESSION:  1.  Stable cardiomegaly pulmonary venous hypertension. 2.  Elevated left hemidiaphragm with mild left basilar atelectasis.  Original Report Authenticated By: Danae Orleans, M.D.    Scheduled Meds:   . albuterol  2.5 mg Nebulization Q6H  . amiodarone  200 mg Oral Daily  . amLODipine  10 mg Oral Daily  . aspirin EC  81 mg Oral Daily  . buPROPion  300 mg Oral Daily  . digoxin  0.25 mg Oral Daily  . divalproex  1,000 mg Oral Daily  . docusate sodium  100 mg Oral BID  . enoxaparin (LOVENOX) injection  135 mg Subcutaneous Q24H  . furosemide  60 mg Intravenous Q12H  . guaiFENesin  600 mg Oral BID  . insulin aspart  0-20 Units Subcutaneous TID WC  . insulin aspart  0-5 Units Subcutaneous QHS  . insulin glargine  20 Units Subcutaneous QHS  . lamoTRIgine  100 mg Oral Daily  . levofloxacin  750 mg Oral QHS  . metoprolol succinate  75 mg Oral Daily  . oxyCODONE  15 mg Oral Q6H  . pantoprazole  40 mg Oral BID AC  . risperiDONE  2 mg Oral QHS  . simvastatin  20 mg Oral q1800  . sodium chloride  3 mL Intravenous Q12H  . sodium chloride  3 mL Intravenous Q12H  . sucralfate  1 g Oral TID WC & HS  . warfarin  20 mg Oral ONCE-1800  . warfarin  20 mg Oral ONCE-1800  .  Warfarin - Pharmacist Dosing Inpatient   Does not apply q1800  . DISCONTD: aspirin EC  325 mg Oral Daily  . DISCONTD: enoxaparin (LOVENOX) injection  150 mg Subcutaneous Q24H  .  DISCONTD: levofloxacin (LEVAQUIN) IV  750 mg Intravenous Q24H  . DISCONTD: sucralfate  1 g Oral TID WC & HS   Continuous Infusions:   ________________________________________________________________________  Time spent: 40 min    [facility]  [facility] Pager [phone] If 8PM-8AM, please contact night-coverage at [email or web address], password [facility] 04/22/2012, 9:46 PM  LOS: 3 days

## 2012-04-22 NOTE — Progress Notes (Signed)
Patient refuses CPAP at this time. States that he has been feeling sick the last two nights and does not want to wear CPAP.

## 2012-04-22 NOTE — Progress Notes (Signed)
04/21/12 Progress Note  [name]  Appreciate cardiology input. Subjective- still have chest discomfort. Less cough. Lost 7 lbs since admission. Objective- Reviewed Vitals- stable.  HEENT- no oral thrush. RS- Good air entry bilaterally. No rhonchi or rales.  CVS- S1S2 heard. No murmurs. RRR. Abdomen- no acute changes. CNS- grossly intact. Extremities- less pedal edema. Reviewed labs, meds. Assessment/Plan 1. Pleuritic chest pain- PNA versus element of congestion. Clinically better. Continue abx/Lasix. Would complete 7 days of abx. 2. Acute decompensation of grade 2 diastolic dysfunction- apapreciate cardiology. Patient not candidate for further cardiac work up due to weight limits. Monitor renal function, hopefully transition to oral Lasix in next day or 2. 3. Morbid obesity(BMI>40)/diabetes mellitus/osa on cpap/htn/gerd- continue current mx. 4. Afib- on lovenox/Coumadin- target inr 2-3. Appreciate pharmacy. 5. Disposition- hopefully Home soon. Evan Parson,MD [phone]

## 2012-04-23 LAB — GLUCOSE, CAPILLARY
Glucose-Capillary: 185 mg/dL — ABNORMAL HIGH (ref 70–99)
Glucose-Capillary: 189 mg/dL — ABNORMAL HIGH (ref 70–99)

## 2012-04-23 LAB — PROTIME-INR
INR: 1.63 — ABNORMAL HIGH (ref 0.00–1.49)
Prothrombin Time: 19.6 seconds — ABNORMAL HIGH (ref 11.6–15.2)

## 2012-04-23 MED ORDER — LACTULOSE 10 GM/15ML PO SOLN
20.0000 g | Freq: Three times a day (TID) | ORAL | Status: AC
  Administered 2012-04-23 (×3): 20 g via ORAL
  Filled 2012-04-23 (×3): qty 30

## 2012-04-23 MED ORDER — METOCLOPRAMIDE HCL 5 MG/ML IJ SOLN
10.0000 mg | Freq: Once | INTRAMUSCULAR | Status: AC
  Administered 2012-04-23: 10 mg via INTRAVENOUS
  Filled 2012-04-23: qty 2

## 2012-04-23 MED ORDER — WARFARIN SODIUM 7.5 MG PO TABS
15.0000 mg | ORAL_TABLET | Freq: Once | ORAL | Status: AC
  Administered 2012-04-23: 15 mg via ORAL
  Filled 2012-04-23: qty 2

## 2012-04-23 MED ORDER — FUROSEMIDE 40 MG PO TABS
40.0000 mg | ORAL_TABLET | Freq: Two times a day (BID) | ORAL | Status: DC
  Administered 2012-04-23 – 2012-04-25 (×4): 40 mg via ORAL
  Filled 2012-04-23 (×6): qty 1

## 2012-04-23 MED ORDER — ALBUTEROL SULFATE (5 MG/ML) 0.5% IN NEBU
2.5000 mg | INHALATION_SOLUTION | Freq: Four times a day (QID) | RESPIRATORY_TRACT | Status: DC
  Administered 2012-04-23 – 2012-04-24 (×5): 2.5 mg via RESPIRATORY_TRACT
  Filled 2012-04-23 (×5): qty 0.5

## 2012-04-23 MED ORDER — POLYETHYLENE GLYCOL 3350 17 G PO PACK
17.0000 g | PACK | Freq: Every day | ORAL | Status: DC
  Administered 2012-04-23 – 2012-04-24 (×2): 17 g via ORAL
  Filled 2012-04-23 (×3): qty 1

## 2012-04-23 MED ORDER — POTASSIUM CHLORIDE CRYS ER 20 MEQ PO TBCR
20.0000 meq | EXTENDED_RELEASE_TABLET | Freq: Every day | ORAL | Status: DC
  Administered 2012-04-23: 20 meq via ORAL
  Filled 2012-04-23 (×2): qty 1

## 2012-04-23 MED FILL — Simvastatin Tab 20 MG: ORAL | Qty: 1 | Status: AC

## 2012-04-23 MED FILL — Sucralfate Tab 1 GM: ORAL | Qty: 1 | Status: AC

## 2012-04-23 MED FILL — Pantoprazole Sodium EC Tab 40 MG (Base Equiv): ORAL | Qty: 1 | Status: AC

## 2012-04-23 MED FILL — Warfarin Sodium Tab 10 MG: ORAL | Qty: 2 | Status: AC

## 2012-04-23 NOTE — Progress Notes (Signed)
Subjective:  Weight unchanged.  Chest pain is better. EKG today is normal.  Objective:  Vital Signs in the last 24 hours: Temp:  [97.4 F (36.3 C)-97.7 F (36.5 C)] 97.5 F (36.4 C) (07/23 0500) Pulse Rate:  [76-79] 76  (07/23 0500) Resp:  [18-20] 20  (07/23 0500) BP: (104-143)/(65-93) 138/93 mmHg (07/23 0500) SpO2:  [90 %-98 %] 98 % (07/23 0500) Weight:  [592 lb 2.5 oz (268.6 kg)] 592 lb 2.5 oz (268.6 kg) (07/23 0500)  Intake/Output from previous day: 07/22 0701 - 07/23 0700 In: 480 [P.O.:480] Out: 400 [Urine:400] Intake/Output from this shift:       . albuterol  2.5 mg Nebulization QID  . amiodarone  200 mg Oral Daily  . amLODipine  10 mg Oral Daily  . aspirin EC  81 mg Oral Daily  . buPROPion  300 mg Oral Daily  . digoxin  0.25 mg Oral Daily  . divalproex  1,000 mg Oral Daily  . docusate sodium  100 mg Oral BID  . enoxaparin (LOVENOX) injection  135 mg Subcutaneous Q24H  . furosemide  60 mg Intravenous Q12H  . guaiFENesin  600 mg Oral BID  . insulin aspart  0-20 Units Subcutaneous TID WC  . insulin aspart  0-5 Units Subcutaneous QHS  . insulin glargine  20 Units Subcutaneous QHS  . lamoTRIgine  100 mg Oral Daily  . levofloxacin  750 mg Oral QHS  . metoprolol succinate  75 mg Oral Daily  . oxyCODONE  15 mg Oral Q6H  . pantoprazole  40 mg Oral BID AC  . risperiDONE  2 mg Oral QHS  . simvastatin  20 mg Oral q1800  . sodium chloride  3 mL Intravenous Q12H  . sodium chloride  3 mL Intravenous Q12H  . sucralfate  1 g Oral TID WC & HS  . warfarin  20 mg Oral ONCE-1800  . warfarin  20 mg Oral ONCE-1800  . Warfarin - Pharmacist Dosing Inpatient   Does not apply q1800  . DISCONTD: albuterol  2.5 mg Nebulization Q6H  . DISCONTD: aspirin EC  325 mg Oral Daily  . DISCONTD: enoxaparin (LOVENOX) injection  150 mg Subcutaneous Q24H  . DISCONTD: levofloxacin (LEVAQUIN) IV  750 mg Intravenous Q24H  . DISCONTD: sucralfate  1 g Oral TID WC & HS      Physical Exam: The  patient appears to be in no distress.  Morbid obesity.  Head and neck exam reveals that the pupils are equal and reactive.  The extraocular movements are full.  There is no scleral icterus.  Mouth and pharynx are benign.  No lymphadenopathy.  No carotid bruits.  The jugular venous pressure is difficult to see.  Thyroid is not enlarged or tender.  Chest is clear to percussion and auscultation.  No rales or rhonchi.  Expansion of the chest is symmetrical.  Heart reveals no abnormal lift or heave.  First and second heart sounds are normal.  There is no murmur gallop rub or click. No chest wall tenderness.  Abdomen is morbidly obese.  Extremities reveal mild edema.   Neurologic exam is normal strength and no lateralizing weakness.  No sensory deficits.  Integument reveals no rash      Lab Results:  [facility] 04/21/12 0659  WBC 9.7  HGB 13.5  PLT 192    Basename 04/22/12 0454 04/21/12 0659  NA 135 136  K 4.7 4.3  CL 93* 94*  CO2 29 33*  GLUCOSE 148* 177*  BUN 31* 26*  CREATININE 1.24 1.06    Basename 04/20/12 1215  TROPONINI <0.30   Hepatic Function Panel  Basename 04/21/12 0659  PROT 7.8  ALBUMIN 3.5  AST 17  ALT 9  ALKPHOS 64  BILITOT 0.5  BILIDIR --  IBILI --   No results found for this basename: CHOL in the last 72 hours No results found for this basename: PROTIME in the last 72 hours  Imaging: No results found.  Cardiac Studies: EKG reviewed. Normal.  Telemetry shows NSR. Assessment/Plan:  Patient Active Hospital Problem List: Chest pain (04/19/2012)   Assessment: No evidence of ischemia or MI   Plan: Mild analgesics Morbid obesity with body mass index of 60.0-69.9 in adult (04/19/2012)   Assessment: Weight loss in hospital reflects effect of diuresis   Plan: Strict diet going forward. Possible consideration of weight loss surgery in future.  CHF (congestive heart failure) (04/19/2012)   Assessment: Diastolic HF   Plan: Continue lasix, follow  BMETs * A-fib (04/19/2012)   Assessment: Maintaining NSR   Plan: Continue amiodarone, warfarin.  No further cardiac workup planned. I emphasized to him the importance of weight loss. I will sign off now. Please call for questions.   LOS: 4 days    Cassell Clement 04/23/2012, 8:05 AM

## 2012-04-23 NOTE — Progress Notes (Signed)
Patient has history of respiratory failure, CHF, and morbid obesity. Patient was admitted for chest pain and shortness of breath. Chest xray was unremarkable. Patient is currently on 5L nasal cannula with diminished breath sounds. RT will change patient to Albuterol QID and continue Q2 PRN.

## 2012-04-23 NOTE — Progress Notes (Signed)
Patient refuses CPAP at this time and requests that the machine be taken out of the room.

## 2012-04-23 NOTE — Progress Notes (Signed)
[name] is a 41 y.o. male admitted on 04/19/12 with bilateral pleuritic chest pain, radiating to the back. CXR suggested possible congestion. However, due to weight limitations, CT chest could not be perfomed to better evaluate lung parencyma. He was hence managed for both acute diastolic chf congestion and for possible CAP. Cardiac enzymes were negative, and 2decho showed normal ef with grade 2 diastolic dysfunction.  A d-dimer was negative. Cardiology graciously helped with his management. The pain is better, although there seems to be some lingering gerd component. He was resterted oncoumadin, currently being overlapped with lovenox, for target inr 2-3. Appreciate pharmacy. Unfortunately, he cannot have further cardiac work up in [place] due to weight limit. I have switched him to oral lasix and requested PT, with hopes of discharge in the next day or 2..  1. CHF (congestive heart failure)   2. A-fib   3. Acute renal failure   4. Acute respiratory failure     Past Medical History  Diagnosis Date  . CHF (congestive heart failure)   . HTN (hypertension)   . Ptosis   . Atrial flutter   . Atrial fibrillation   . Morbid obesity   . DM2 (diabetes mellitus, type 2)   . Depression   . Respiratory failure   . Chronic low back pain   . Diabetes mellitus   . Asthma   . Vision abnormalities   . Abscess     right thigh   Current Facility-Administered Medications  Medication Dose Route Frequency Provider Last Rate Last Dose  . 0.9 %  sodium chloride infusion  250 mL Intravenous PRN Jaicee Michelotti, MD      . acetaminophen (TYLENOL) tablet 650 mg  650 mg Oral Q6H PRN Thalya Fouche, MD       Or  . acetaminophen (TYLENOL) suppository 650 mg  650 mg Rectal Q6H PRN Jalik Gellatly, MD      . albuterol (PROVENTIL) (5 MG/ML) 0.5% nebulizer solution 2.5 mg  2.5 mg Nebulization Q2H PRN Cris Gibby, MD      . albuterol (PROVENTIL) (5 MG/ML) 0.5% nebulizer solution 2.5 mg  2.5 mg Nebulization QID  Calvert Cantor, MD   2.5 mg at 04/23/12 1611  . alum & mag hydroxide-simeth (MAALOX/MYLANTA) 200-200-20 MG/5ML suspension 30 mL  30 mL Oral Q6H PRN Andrius Andrepont, MD      . amiodarone (PACERONE) tablet 200 mg  200 mg Oral Daily Daxtyn Rottenberg, MD   200 mg at 04/23/12 1038  . amLODipine (NORVASC) tablet 10 mg  10 mg Oral Daily Moosa Bueche, MD   10 mg at 04/23/12 1040  . aspirin EC tablet 81 mg  81 mg Oral Daily Calvert Cantor, MD   81 mg at 04/23/12 1039  . buPROPion (WELLBUTRIN XL) 24 hr tablet 300 mg  300 mg Oral Daily Bassel Gaskill, MD   300 mg at 04/23/12 1039  . digoxin (LANOXIN) tablet 0.25 mg  0.25 mg Oral Daily Kenichi Cassada, MD   0.25 mg at 04/23/12 1038  . divalproex (DEPAKOTE) DR tablet 1,000 mg  1,000 mg Oral Daily Manali Mcelmurry, MD   1,000 mg at 04/23/12 1039  . docusate sodium (COLACE) capsule 100 mg  100 mg Oral BID Houa Nie, MD   100 mg at 04/23/12 1039  . enoxaparin (LOVENOX) injection 135 mg  135 mg Subcutaneous Q24H Winfield Rast, PHARMD   135 mg at 04/22/12 2127  . furosemide (LASIX) tablet 40 mg  40 mg Oral BID Terah Robey  Ramsha Lonigro, MD      . guaiFENesin (MUCINEX) 12 hr tablet 600 mg  600 mg Oral BID Kaydra Borgen, MD   600 mg at 04/23/12 1039  . insulin aspart (novoLOG) injection 0-20 Units  0-20 Units Subcutaneous TID WC Oshea Percival, MD   4 Units at 04/23/12 1419  . insulin aspart (novoLOG) injection 0-5 Units  0-5 Units Subcutaneous QHS Kishon Garriga, MD      . insulin glargine (LANTUS) injection 20 Units  20 Units Subcutaneous QHS Aeva Posey, MD   20 Units at 04/22/12 2126  . lactulose (CHRONULAC) 10 GM/15ML solution 20 g  20 g Oral TID Quindarrius Joplin, MD   20 g at 04/23/12 1303  . lamoTRIgine (LAMICTAL) tablet 100 mg  100 mg Oral Daily Chaska Hagger, MD   100 mg at 04/23/12 1040  . levofloxacin (LEVAQUIN) tablet 750 mg  750 mg Oral QHS Winfield Rast, PHARMD   750 mg at 04/22/12 2127  . metoCLOPramide (REGLAN) injection 10 mg  10 mg Intravenous Once Gabreille Dardis, MD       . metoprolol succinate (TOPROL-XL) 24 hr tablet 75 mg  75 mg Oral Daily Lexus Barletta, MD   75 mg at 04/23/12 1039  . morphine 4 MG/ML injection 4 mg  4 mg Intravenous Q4H PRN Makeda Peeks, MD   4 mg at 04/23/12 1036  . nitroGLYCERIN (NITROSTAT) SL tablet 0.4 mg  0.4 mg Sublingual Q5 Min x 3 PRN Kym Scannell, MD   0.4 mg at 04/20/12 1137  . ondansetron (ZOFRAN) tablet 4 mg  4 mg Oral Q6H PRN Chari Parmenter, MD       Or  . ondansetron (ZOFRAN) injection 4 mg  4 mg Intravenous Q6H PRN Yarianna Varble, MD   4 mg at 04/23/12 0729  . oxyCODONE (Oxy IR/ROXICODONE) immediate release tablet 15 mg  15 mg Oral Q6H Verdis Bassette, MD   15 mg at 04/23/12 1303  . pantoprazole (PROTONIX) EC tablet 40 mg  40 mg Oral BID AC Francina Beery, MD   40 mg at 04/23/12 0853  . polyethylene glycol (MIRALAX / GLYCOLAX) packet 17 g  17 g Oral Daily Shenna Brissette, MD   17 g at 04/23/12 1303  . potassium chloride SA (K-DUR,KLOR-CON) CR tablet 20 mEq  20 mEq Oral Daily Angelicia Lessner, MD      . risperiDONE (RISPERDAL) tablet 2 mg  2 mg Oral QHS Nakyiah Kuck, MD   2 mg at 04/22/12 2127  . simvastatin (ZOCOR) tablet 20 mg  20 mg Oral q1800 Caylynn Minchew, MD   20 mg at 04/22/12 1849  . sodium chloride 0.9 % injection 3 mL  3 mL Intravenous Q12H Gust Eugene, MD   3 mL at 04/23/12 1040  . sodium chloride 0.9 % injection 3 mL  3 mL Intravenous Q12H Jaycion Treml, MD   3 mL at 04/21/12 1000  . sodium chloride 0.9 % injection 3 mL  3 mL Intravenous PRN Dreux Mcgroarty, MD      . sucralfate (CARAFATE) tablet 1 g  1 g Oral TID WC & HS Tyran Huser, MD   1 g at 04/23/12 1304  . warfarin (COUMADIN) tablet 15 mg  15 mg Oral ONCE-1800 Christine E Shade, PHARMD      . warfarin (COUMADIN) tablet 20 mg  20 mg Oral ONCE-1800 Loma Messing Borgerding, PHARMD      . warfarin (COUMADIN) tablet 20 mg  20 mg Oral ONCE-1800 Winfield Rast, PHARMD  20 mg at 04/22/12 1849  . Warfarin - Pharmacist Dosing Inpatient   Does not apply q1800 Loma Messing [place]gerding, PHARMD      . DISCONTD: albuterol (PROVENTIL) (5 MG/ML) 0.5% nebulizer solution 2.5 mg  2.5 mg Nebulization Q6H Toivo Bordon, MD   2.5 mg at 04/22/12 2013  . DISCONTD: aspirin EC tablet 325 mg  325 mg Oral Daily Valerio Pinard, MD   325 mg at 04/22/12 0947  . DISCONTD: furosemide (LASIX) injection 60 mg  60 mg Intravenous Q12H Chance Munter, MD   60 mg at 04/23/12 1036  . DISCONTD: sucralfate (CARAFATE) tablet 1 g  1 g Oral TID WC & HS Calvert Cantor, MD       Allergies  Allergen Reactions  . Shellfish Allergy Anaphylaxis  . Iodine     Associated with the shellfish allergy   Active Problems:  Chest pain  Morbid obesity with body mass index of 60.0-69.9 in adult  HTN (hypertension), benign  CHF (congestive heart failure)  OSA (obstructive sleep apnea)  A-fib   Vital signs in last 24 hours: Temp:  [97.5 F (36.4 C)-97.7 F (36.5 C)] 97.5 F (36.4 C) (07/23 1420) Pulse Rate:  [76-78] 78  (07/23 1420) Resp:  [18-20] 18  (07/23 1420) BP: (104-138)/(65-93) 116/71 mmHg (07/23 1420) SpO2:  [72 %-98 %] 97 % (07/23 1611) Weight:  [268.6 kg (592 lb 2.5 oz)] 268.6 kg (592 lb 2.5 oz) (07/23 0500) Weight change: 0 kg (0 lb) Last BM Date: 04/19/12  Intake/Output from previous day: 07/22 0701 - 07/23 0700 In: 480 [P.O.:480] Out: 400 [Urine:400] Intake/Output this shift: Total I/O In: 480 [P.O.:480] Out: 600 [Urine:600]  Lab Results:  [facility] 04/21/12 0659  WBC 9.7  HGB 13.5  HCT 39.6  PLT 192   BMET  Basename 04/22/12 0454 04/21/12 0659  NA 135 136  K 4.7 4.3  CL 93* 94*  CO2 29 33*  GLUCOSE 148* 177*  BUN 31* 26*  CREATININE 1.24 1.06  CALCIUM 9.2 9.2    Studies/Results: No results found.  Medications: I have reviewed the patient's current medications.   Physical exam GENERAL- alert HEAD- normal atraumatic, no neck masses, normal thyroid, no jvd RESPIRATORY- appears well, vitals normal, no respiratory distress, acyanotic, normal RR, ear  and throat exam is normal, neck free of mass or lymphadenopathy, chest clear, no wheezing, crepitations, rhonchi, normal symmetric air entry CVS- regular rate and rhythm, S1, S2 normal, no murmur, click, rub or gallop ABDOMEN- morbidly obese. NEURO- Grossly normal EXTREMITIES- extremities normal, atraumatic, no cyanosis or edema  Plan   .Chest pain- cardiac enzymes negative- pleuritic in nature, diastolic chf element, could not get ct chest due to weight limit. pNA versus cardiac versus GERD. Will continue current mx, Added carafate. Switched to oral levaquin/lasix.  Marland KitchenHTN (hypertension), benign- Uncontrolled. Increase toprol xl. Marland KitchenCHF (congestive heart failure) grade 2 diastolic- appreciate cardiology. Switched to oral lasix. .OSA (obstructive sleep apnea)- Likely pulmonary htn. has CPAP. Continue.  Marland KitchenA-fib- rate controlled. Resumed coumadin. Apprecaite pharmacy. [facility] INR 2-3. Hopefully home in next day or 2. Will ambulate.        Aliahna Statzer 04/23/2012 5:12 PM Pager: [phone].

## 2012-04-23 NOTE — Progress Notes (Signed)
ANTICOAGULATION CONSULT NOTE   Pharmacy Consult for Warfarin/Lovenox Indication: atrial fibrillation  Allergies  Allergen Reactions  . Shellfish Allergy Anaphylaxis  . Iodine     Associated with the shellfish allergy    Patient Measurements: Height: 6\' 4"  (193 cm) Weight: 592 lb 2.5 oz (268.6 kg) IBW/kg (Calculated) : 86.8   Vital Signs: Temp: 97.5 F (36.4 C) (07/23 0500) Temp src: Oral (07/23 0500) BP: 138/93 mmHg (07/23 0500) Pulse Rate: 76  (07/23 0500)  Labs:  Alvira Philips 04/23/12 0524 04/22/12 0454 04/21/12 0659 04/20/12 1215  HGB -- -- 13.5 --  HCT -- -- 39.6 --  PLT -- -- 192 --  APTT -- -- -- --  LABPROT 19.6* 14.5 13.2 --  INR 1.63* 1.11 0.98 --  HEPARINUNFRC -- -- -- --  CREATININE -- 1.24 1.06 --  CKTOTAL -- -- -- 110  CKMB -- -- -- 1.7  TROPONINI -- -- -- <0.30    Estimated Creatinine Clearance: 176.9 ml/min (by C-G formula based on Cr of 1.24).   Medications:  Scheduled:     . albuterol  2.5 mg Nebulization QID  . amiodarone  200 mg Oral Daily  . amLODipine  10 mg Oral Daily  . aspirin EC  81 mg Oral Daily  . buPROPion  300 mg Oral Daily  . digoxin  0.25 mg Oral Daily  . divalproex  1,000 mg Oral Daily  . docusate sodium  100 mg Oral BID  . enoxaparin (LOVENOX) injection  135 mg Subcutaneous Q24H  . furosemide  60 mg Intravenous Q12H  . guaiFENesin  600 mg Oral BID  . insulin aspart  0-20 Units Subcutaneous TID WC  . insulin aspart  0-5 Units Subcutaneous QHS  . insulin glargine  20 Units Subcutaneous QHS  . lamoTRIgine  100 mg Oral Daily  . levofloxacin  750 mg Oral QHS  . metoprolol succinate  75 mg Oral Daily  . oxyCODONE  15 mg Oral Q6H  . pantoprazole  40 mg Oral BID AC  . risperiDONE  2 mg Oral QHS  . simvastatin  20 mg Oral q1800  . sodium chloride  3 mL Intravenous Q12H  . sodium chloride  3 mL Intravenous Q12H  . sucralfate  1 g Oral TID WC & HS  . warfarin  20 mg Oral ONCE-1800  . warfarin  20 mg Oral ONCE-1800  . Warfarin  - Pharmacist Dosing Inpatient   Does not apply q1800  . DISCONTD: albuterol  2.5 mg Nebulization Q6H  . DISCONTD: aspirin EC  325 mg Oral Daily  . DISCONTD: enoxaparin (LOVENOX) injection  150 mg Subcutaneous Q24H  . DISCONTD: levofloxacin (LEVAQUIN) IV  750 mg Intravenous Q24H  . DISCONTD: sucralfate  1 g Oral TID WC & HS   Assessment:  41 yo obese M with history of A.Fib, previous on warfarin but has not been taking since April (?insurance issues).  PTA (April) Patient was taking 10 MWF/Sun and 15 mg on Tues/Thur/Sat  Patient admitted 7/19 with chest pain and started on treatment dose lovenox for r/o ACS.  CEs were negative and Dr. Venetia Constable decreased Lovenox on 7/20 to weight adjust prophylactic dose while INR is below goal.  INR on admission was at baseline   Lovenox dose re-adjusted 7/22 for weight change  INR starting to respond, increased to 1.63 today after boosted doses of 15mg  x1 and 20mg  x2l  DI: Amiodarone/Levaquin (Can cause increase in INR)  Goal of Therapy:  INR 2-3 Monitor platelets by  anticoagulation protocol: Yes   Plan:   Continue Lovenox 135 mg SQ Q24hr  Warfarin 15 mg PO x 1 at 1800  Daily PT/INR   Lynann Beaver PharmD, BCPS Pager [phone] 04/23/2012 9:10 AM

## 2012-04-24 DIAGNOSIS — R079 Chest pain, unspecified: Secondary | ICD-10-CM

## 2012-04-24 LAB — BASIC METABOLIC PANEL
BUN: 31 mg/dL — ABNORMAL HIGH (ref 6–23)
CO2: 33 mEq/L — ABNORMAL HIGH (ref 19–32)
Chloride: 96 mEq/L (ref 96–112)
Chloride: 99 mEq/L (ref 96–112)
Creatinine, Ser: 1.22 mg/dL (ref 0.50–1.35)
GFR calc Af Amer: 84 mL/min — ABNORMAL LOW (ref >90)
GFR calc Af Amer: 83 mL/min — ABNORMAL LOW (ref >90)
GFR calc non Af Amer: 71 mL/min — ABNORMAL LOW (ref >90)
Glucose, Bld: 180 mg/dL — ABNORMAL HIGH (ref 70–99)
Potassium: 5.5 mEq/L — ABNORMAL HIGH (ref 3.5–5.1)
Potassium: 4.6 mEq/L (ref 3.5–5.1)
Sodium: 139 mEq/L (ref 135–145)

## 2012-04-24 LAB — GLUCOSE, CAPILLARY
Glucose-Capillary: 199 mg/dL — ABNORMAL HIGH (ref 70–99)
Glucose-Capillary: 184 mg/dL — ABNORMAL HIGH (ref 70–99)
Glucose-Capillary: 157 mg/dL — ABNORMAL HIGH (ref 70–99)

## 2012-04-24 LAB — PROTIME-INR: INR: 1.9 — ABNORMAL HIGH (ref 0.00–1.49)

## 2012-04-24 LAB — CBC
HCT: 40.9 % (ref 39.0–52.0)
Hemoglobin: 13.4 g/dL (ref 13.0–17.0)
MCV: 83.8 fL (ref 78.0–100.0)
RDW: 14.8 % (ref 11.5–15.5)
WBC: 8.7 10*3/uL (ref 4.0–10.5)

## 2012-04-24 MED ORDER — DOXYCYCLINE HYCLATE 100 MG PO TABS
100.0000 mg | ORAL_TABLET | Freq: Two times a day (BID) | ORAL | Status: DC
  Administered 2012-04-24 – 2012-04-25 (×3): 100 mg via ORAL
  Filled 2012-04-24 (×4): qty 1

## 2012-04-24 MED ORDER — WARFARIN SODIUM 7.5 MG PO TABS
15.0000 mg | ORAL_TABLET | Freq: Once | ORAL | Status: AC
  Administered 2012-04-24: 15 mg via ORAL
  Filled 2012-04-24: qty 2

## 2012-04-24 MED ORDER — ALBUTEROL SULFATE (5 MG/ML) 0.5% IN NEBU
2.5000 mg | INHALATION_SOLUTION | Freq: Two times a day (BID) | RESPIRATORY_TRACT | Status: DC
  Administered 2012-04-24 – 2012-04-25 (×2): 2.5 mg via RESPIRATORY_TRACT
  Filled 2012-04-24 (×2): qty 0.5

## 2012-04-24 MED ORDER — IPRATROPIUM BROMIDE 0.02 % IN SOLN
RESPIRATORY_TRACT | Status: AC
  Filled 2012-04-24: qty 2.5

## 2012-04-24 NOTE — Evaluation (Signed)
Physical Therapy Evaluation Patient Details Name: [name] MRN: [id number] DOB: 15-Jun-1971 Today's Date: 04/24/2012 Time: 4098-1191 PT Time Calculation (min): 17 min  PT Assessment / Plan / Recommendation Clinical Impression  Pt admitted for chest pain and diagnosed with CHF and afib.  Pt also morbidly obese.  Pt would benefit from acute PT services in order to continue safely mobilizing pt while in hospital.  Pt reports he is interested in aquatic PT due to his obesity and back and sciatic pain (involving both LEs).  Recommended pt discuss this with PCP.      PT Assessment  Patient needs continued PT services    Follow Up Recommendations  Outpatient PT    Barriers to Discharge        Equipment Recommendations  None recommended by PT    Recommendations for Other Services     Frequency Min 3X/week    Precautions / Restrictions Restrictions Weight Bearing Restrictions: No   Pertinent Vitals/Pain Pt reporting 4/10 low back pain, repositioned to comfort      Mobility  Bed Mobility Bed Mobility: Sit to Supine Sit to Supine: 5: Supervision;HOB flat Details for Bed Mobility Assistance: increased time, pt tends to sit diagonally onto bed and elevate one LE first during movement then brings other leg onto bed Transfers Transfers: Stand to Sit;Sit to Stand Sit to Stand: 5: Supervision Stand to Sit: 5: Supervision Details for Transfer Assistance: supervision for lines Ambulation/Gait Ambulation/Gait Assistance: 4: Min guard Ambulation Distance (Feet): 120 Feet Assistive device: Rolling walker Ambulation/Gait Assistance Details: Pt reports dizziness with ambulation (reports sometimes this happens at home) requiring 3 standing rest breaks for dizziness to lessen, ambulated on 6L with SaO2 93% upon returning to room, pt on 5L in room and was 93% prior to ambulating Gait Pattern: Step-through pattern;Wide base of support;Lateral trunk lean to right;Lateral trunk lean to left      Exercises     PT Diagnosis: Difficulty walking  PT Problem List: Decreased activity tolerance;Decreased mobility;Cardiopulmonary status limiting activity;Obesity PT Treatment Interventions: DME instruction;Gait training;Functional mobility training;Therapeutic activities;Therapeutic exercise;Patient/family education   PT Goals Acute Rehab PT Goals PT Goal Formulation: With patient Time For Goal Achievement: 05/01/12 Potential to Achieve Goals: Good Pt will go Sit to Stand: with modified independence PT Goal: Sit to Stand - Progress: Goal set today Pt will go Stand to Sit: with modified independence PT Goal: Stand to Sit - Progress: Goal set today Pt will Ambulate: >150 feet;with modified independence;Other (comment) (with SaO2 >92%) PT Goal: Ambulate - Progress: Goal set today  Visit Information  Last PT Received On: 04/24/12 Assistance Needed: +2 (for safety)    Subjective Data  Subjective: I want to do well so you all let me know if I'm pushing it too much because I tend to overdo things.   Prior Functioning  Home Living Lives With: Alone Type of Home: Apartment Home Access: Level entry Home Layout: One level Home Adaptive Equipment: None Additional Comments: Pt reports using oxygen at home, usually on 3L. 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 Communication Communication: No difficulties    Cognition  Overall Cognitive Status: Appears within functional limits for tasks assessed/performed Arousal/Alertness: Awake/alert Orientation Level: Appears intact for tasks assessed Behavior During Session: [facility] for tasks performed    Extremity/Trunk Assessment Right Upper Extremity Assessment RUE ROM/Strength/Tone: [facility] for tasks assessed Left Upper Extremity Assessment  LUE ROM/Strength/Tone: WFL for tasks assessed Right Lower Extremity  Assessment RLE ROM/Strength/Tone: Deficits RLE ROM/Strength/Tone Deficits: strength WFL for tasks assessed however deficits in ROM due to body habitus RLE Sensation: WFL - Light Touch Left Lower Extremity Assessment LLE ROM/Strength/Tone: Deficits LLE ROM/Strength/Tone Deficits: strength WFL for tasks assessed however deficits in ROM due to body habitus LLE Sensation: WFL - Light Touch   Balance    End of Session PT - End of Session Equipment Utilized During Treatment: Oxygen Activity Tolerance: Patient limited by fatigue;Other (comment) (dizziness with ambulation) Patient left: in bed;with call bell/phone within reach  GP     [facility] E 04/24/2012, 12:07 PM Pager: [phone]

## 2012-04-24 NOTE — Plan of Care (Signed)
Problem: Phase I Progression Outcomes Goal: EF % per last Echo/documented,Core Reminder form on chart Outcome: Completed/Met Date Met:  04/24/12 60-65%

## 2012-04-24 NOTE — Progress Notes (Signed)
Spoke with patient regarding cpap.  Pt stated he has been nauseated for the last several days and has not worn cpap.  Pt also refuses to wear tonight.  Pt advised that RT available all night should he change his mind, to let his nurse know.

## 2012-04-24 NOTE — Progress Notes (Signed)
ANTICOAGULATION CONSULT NOTE   Pharmacy Consult for Warfarin/Lovenox Indication: atrial fibrillation  Allergies  Allergen Reactions  . Shellfish Allergy Anaphylaxis  . Iodine     Associated with the shellfish allergy    Patient Measurements: Height: 6\' 4"  (193 cm) Weight: 593 lb 1.6 oz (269.028 kg) IBW/kg (Calculated) : 86.8   Vital Signs: Temp: 97.8 F (36.6 C) (07/24 0453) Temp src: Oral (07/24 0453) BP: 156/80 mmHg (07/24 0453) Pulse Rate: 82  (07/24 0453)  Labs:  Basename 04/24/12 0430 04/23/12 0524 04/22/12 0454  HGB 13.4 -- --  HCT 40.9 -- --  PLT 195 -- --  APTT -- -- --  LABPROT 22.1* 19.6* 14.5  INR 1.90* 1.63* 1.11  HEPARINUNFRC -- -- --  CREATININE 1.22 -- 1.24  CKTOTAL -- -- --  CKMB -- -- --  TROPONINI -- -- --    Estimated Creatinine Clearance: 180 ml/min (by C-G formula based on Cr of 1.22).   Medications:  Scheduled:     . albuterol  2.5 mg Nebulization QID  . amiodarone  200 mg Oral Daily  . amLODipine  10 mg Oral Daily  . aspirin EC  81 mg Oral Daily  . buPROPion  300 mg Oral Daily  . digoxin  0.25 mg Oral Daily  . divalproex  1,000 mg Oral Daily  . docusate sodium  100 mg Oral BID  . enoxaparin (LOVENOX) injection  135 mg Subcutaneous Q24H  . furosemide  40 mg Oral BID  . guaiFENesin  600 mg Oral BID  . insulin aspart  0-20 Units Subcutaneous TID WC  . insulin aspart  0-5 Units Subcutaneous QHS  . insulin glargine  20 Units Subcutaneous QHS  . ipratropium      . lactulose  20 g Oral TID  . lamoTRIgine  100 mg Oral Daily  . levofloxacin  750 mg Oral QHS  . metoCLOPramide (REGLAN) injection  10 mg Intravenous Once  . metoprolol succinate  75 mg Oral Daily  . oxyCODONE  15 mg Oral Q6H  . pantoprazole  40 mg Oral BID AC  . polyethylene glycol  17 g Oral Daily  . potassium chloride  20 mEq Oral Daily  . risperiDONE  2 mg Oral QHS  . simvastatin  20 mg Oral q1800  . sodium chloride  3 mL Intravenous Q12H  . sodium chloride  3 mL  Intravenous Q12H  . sucralfate  1 g Oral TID WC & HS  . warfarin  15 mg Oral ONCE-1800  . Warfarin - Pharmacist Dosing Inpatient   Does not apply q1800  . DISCONTD: furosemide  60 mg Intravenous Q12H   Assessment:  41 yo obese M with history of A.Fib, previous on warfarin but has not been taking since April (?insurance issues).  PTA (April) Patient was taking 10 M/W/F/Sun and 15 mg on Tues/Thur/Sat  Patient admitted 7/19 with chest pain and started on treatment dose lovenox for r/o ACS.  CEs were negative and Dr. Venetia Constable decreased Lovenox on 7/20 to weight adjust prophylactic dose while INR is below goal.  INR on admission was at baseline   Lovenox dose re-adjusted 7/22 for weight change  INR subtherapeutic but responding; increased to 1.9 today.  CBC:  Hgb and Plt stable/wnl  Noted DI: Amiodarone/Levaquin (Can cause increase in INR)  Goal of Therapy:  INR 2-3 Monitor platelets by anticoagulation protocol: Yes   Plan:   Continue Lovenox 135 mg SQ Q24hr  Warfarin 15 mg PO x 1 at 1800  Daily PT/INR   Lynann Beaver PharmD, BCPS Pager [phone] 04/24/2012 8:39 AM

## 2012-04-24 NOTE — Progress Notes (Signed)
PROGRESS NOTE  [name]:[id number] DOB: 1971-04-01 DOA: 04/19/2012 PCP: Emeterio Reeve, [name]  Brief narrative: [name] is a 41 y.o. male admitted on 04/19/12 with bilateral pleuritic chest pain, radiating to the back. CXR suggested possible congestion. However, due to weight limitations, CT chest could not be perfomed to better evaluate lung parencyma. He was hence managed for both acute diastolic chf congestion and for possible CAP. Cardiac enzymes were negative, and 2decho showed normal ef with grade 2 diastolic dysfunction. A d-dimer was negative. Cardiology graciously helped with his management. The pain is better, although there seems to be some lingering gerd component. He was resterted on coumadin, currently being overlapped with lovenox, for target inr 2-3. Appreciate pharmacy. Unfortunately, he cannot have further cardiac work up in [place] due to weight limit. I have switched him to oral lasix and requested PT, with hopes of discharge in the next day or 2..   Past medical history/Chart review History of obesity History of hypertension History of obstructive sleep apnea History of diabetes mellitus History of asthma History of reflux Admission 623/2009 for bilateral pneumonia Admission 05/04/09 for chest pain secondary to the atrial flutter Admitted 06/16/09 for acute decompensated CHF admission 07/07/2009 A. fib with RVR-status post cardioversion   Consultants:  Cardiology-Dr. Patty Sermons  Procedures:  Chest x-ray 7/19 = mild cardiomegaly with severe pulmonary vascular congestion, no frank edema at this time  Urine culture = no growth  Antibiotics:  Levofloxacin-750 mg 7/19->7/24  Azithromycin 7/19  Ceftriaxone 7/19  Doxicycline 7/24   Subjective  Feels poorly.  Did not sleep last night.  Had multiple episodes of nausea but no specific vomiting. No chest pain no shortness of breath currently.  Had some shortness of breath last night Patient noncompliant  with CPAP at home because he did not get the power cord for it No blurred or double vision. No diarrhea at present time, no fever   Objective    Interim History: Reviewed cardiologist note Reviewed other notes  Subjective: Nursing reports somnolence and nausea  Objective: Filed Vitals:   04/23/12 2150 04/24/12 0453 04/24/12 0940 04/24/12 0946  BP: 112/71 156/80 155/78   Pulse: 75 82 80   Temp: 97.4 F (36.3 C) 97.8 F (36.6 C)    TempSrc: Oral Oral    Resp: 18 18    Height:      Weight:  269.028 kg (593 lb 1.6 oz)    SpO2: 93% 98%  99%    Intake/Output Summary (Last 24 hours) at 04/24/12 1134 Last data filed at 04/24/12 0900  Gross per 24 hour  Intake    720 ml  Output    600 ml  Net    120 ml    Exam:  General: Super morbid obese Caucasian male in no apparent distress.  Nystagmus to left eye with drooping right eyelid patient also has a Mallampatti of 4.  Poor dentition.  Thick neck Cardiovascular: S1-S2 no murmur rub or gallop.  Telel NSR at present.  ?Huston Foley.  No t wave elevations Respiratory: Clinically clear no added sound Abdomen: Obese nontender nondistended Skin no rash Neuro cranial nerves II through XII grossly intact  Data Reviewed: Basic Metabolic Panel:  Lab 04/24/12 6578 04/22/12 0454 04/21/12 0659 04/20/12 0358 04/19/12 1648  NA 138 135 136 -- 140  K 5.5* 4.7 -- -- --  CL 96 93* 94* -- 99  CO2 33* 29 33* -- 33*  GLUCOSE 180* 148* 177* -- 218*  BUN 31* 31*  26* -- 22  CREATININE 1.22 1.24 1.06 -- 1.31  CALCIUM 9.0 9.2 9.2 -- 9.3  MG 2.4 -- -- 2.0 --  PHOS 3.7 -- -- 4.9* --   Liver Function Tests:  Lab 04/21/12 0659  AST 17  ALT 9  ALKPHOS 64  BILITOT 0.5  PROT 7.8  ALBUMIN 3.5   No results found for this basename: LIPASE:5,AMYLASE:5 in the last 168 hours No results found for this basename: AMMONIA:5 in the last 168 hours CBC:  Lab 04/24/12 0430 04/21/12 0659 04/19/12 1648  WBC 8.7 9.7 9.3  NEUTROABS -- -- 5.7  HGB 13.4 13.5  13.3  HCT 40.9 39.6 40.0  MCV 83.8 83.0 84.6  PLT 195 192 198   Cardiac Enzymes:  Lab 04/20/12 1215 04/20/12 0358 04/19/12 2045  CKTOTAL 110 98 106  CKMB 1.7 1.5 1.6  CKMBINDEX -- -- --  TROPONINI <0.30 <0.30 <0.30   BNP: No components found with this basename: POCBNP:5 CBG:  Lab 04/24/12 0746 04/23/12 2147 04/23/12 1654 04/23/12 1211 04/23/12 0734  GLUCAP 199* 189* 160* 185* 152*    Recent Results (from the past 240 hour(s))  URINE CULTURE     Status: Normal   Collection Time   04/20/12  2:37 AM      Component Value Range Status Comment   Specimen Description URINE, CLEAN CATCH   Final    Special Requests NONE   Final    Culture  Setup Time 04/20/2012 11:34   Final    Colony Count NO GROWTH   Final    Culture NO GROWTH   Final    Report Status 04/21/2012 FINAL   Final   CULTURE, EXPECTORATED SPUTUM-ASSESSMENT     Status: Normal   Collection Time   04/21/12  4:57 AM      Component Value Range Status Comment   Specimen Description SPUTUM   Final    Special Requests NONE   Final    Sputum evaluation     Final    Value: MICROSCOPIC FINDINGS SUGGEST THAT THIS SPECIMEN IS NOT REPRESENTATIVE OF LOWER RESPIRATORY SECRETIONS. PLEASE RECOLLECT.     CALLED TO DEBBIE/RN AT 0555 04/21/2012 BY R LINEBERRY   Report Status 04/21/2012 FINAL   Final      Studies:              All Imaging reviewed and is as per above notation   Scheduled Meds:   . albuterol  2.5 mg Nebulization QID  . amiodarone  200 mg Oral Daily  . amLODipine  10 mg Oral Daily  . aspirin EC  81 mg Oral Daily  . buPROPion  300 mg Oral Daily  . digoxin  0.25 mg Oral Daily  . divalproex  1,000 mg Oral Daily  . docusate sodium  100 mg Oral BID  . enoxaparin (LOVENOX) injection  135 mg Subcutaneous Q24H  . furosemide  40 mg Oral BID  . guaiFENesin  600 mg Oral BID  . insulin aspart  0-20 Units Subcutaneous TID WC  . insulin aspart  0-5 Units Subcutaneous QHS  . insulin glargine  20 Units Subcutaneous QHS  .  ipratropium      . lactulose  20 g Oral TID  . lamoTRIgine  100 mg Oral Daily  . levofloxacin  750 mg Oral QHS  . metoCLOPramide (REGLAN) injection  10 mg Intravenous Once  . metoprolol succinate  75 mg Oral Daily  . oxyCODONE  15 mg Oral Q6H  . pantoprazole  40 mg Oral BID AC  . polyethylene glycol  17 g Oral Daily  . risperiDONE  2 mg Oral QHS  . simvastatin  20 mg Oral q1800  . sodium chloride  3 mL Intravenous Q12H  . sodium chloride  3 mL Intravenous Q12H  . sucralfate  1 g Oral TID WC & HS  . warfarin  15 mg Oral ONCE-1800  . warfarin  15 mg Oral ONCE-1800  . Warfarin - Pharmacist Dosing Inpatient   Does not apply q1800  . DISCONTD: furosemide  60 mg Intravenous Q12H  . DISCONTD: potassium chloride  20 mEq Oral Daily   Continuous Infusions:    Assessment/Plan:  .Chest pain- cardiac enzymes negative- pleuritic in nature, diastolic chf element, could not get ct chest due to weight limit. Pna (communtity aquired) versus cardiac versus GERD. Will continue current mx, Added carafate. Switched to oral levaquin/lasix-Keep Tele given risk for Qtc prolongation-Alternatives include Doxicycline when d/c am. .HTN (hypertension), benign- Uncontrolled. Increased Toprol to 75 mg 7/23, On Amlodipine 10 mg-Add low dose thiazide if able to tolerate as outpatient .CHF (congestive heart failure) grade 2 diastolic- appreciate cardiology. Switched to oral lasix 40 bid .OSA (obstructive sleep apnea)- Likely pulmonary htn. has CPAP. Continue.  Marland KitchenA-fib- rate controlled, on Amiodarone 200/Digoxin 0.25mg . Resumed coumadin. Appreciate pharmacy. Target INR 2-3, today 1.90.  Hyperkalemia-likely iatrogenic.  D/c  KDur 20 today.  Rpt labs in am-Tele=no T wave spikes CKD stage 1?  Use MDRF given [place]. Nausea-This is concurrent with his  "dizzyness"  Will review again in am.  Ambulate.  If no better, would ask PT to eval for vertigo, given he has chronic nystagmus.  Increase Chronic Pain-continue OxyIR 15  mg-will wean Morphine q 4 hr off completely.  Patient informed of the same HLD-Continue Zocor 20 Depression-continue Risperdal 2 mf/Wellbutrin 300 XL DM-well controlled.  Sugars between 180-199-continue Lantus 20   Code Status: Full  Family Communication: none at bedside today Disposition Plan: Home when ambulant   Evan Koch, [name]  [facility] Pager [phone] 04/24/2012, 11:34 AM    LOS: 5 days

## 2012-04-25 DIAGNOSIS — R0789 Other chest pain: Secondary | ICD-10-CM

## 2012-04-25 LAB — BASIC METABOLIC PANEL
Calcium: 9 mg/dL (ref 8.4–10.5)
GFR calc Af Amer: >90 mL/min (ref >90)
GFR calc non Af Amer: 83 mL/min — ABNORMAL LOW (ref >90)
Glucose, Bld: 186 mg/dL — ABNORMAL HIGH (ref 70–99)
Potassium: 4.4 mEq/L (ref 3.5–5.1)
Sodium: 140 mEq/L (ref 135–145)

## 2012-04-25 LAB — GLUCOSE, CAPILLARY

## 2012-04-25 LAB — PROTIME-INR
INR: 1.83 — ABNORMAL HIGH (ref 0.00–1.49)
Prothrombin Time: 21.5 seconds — ABNORMAL HIGH (ref 11.6–15.2)

## 2012-04-25 MED ORDER — FUROSEMIDE 40 MG PO TABS: 40.0000 mg | ORAL_TABLET | Freq: Two times a day (BID) | ORAL | Status: DC

## 2012-04-25 MED ORDER — OXYCODONE HCL 15 MG PO TABS: 15.0000 mg | ORAL_TABLET | ORAL | Status: DC | PRN

## 2012-04-25 MED ORDER — WARFARIN SODIUM 7.5 MG PO TABS
15.0000 mg | ORAL_TABLET | Freq: Once | ORAL | Status: DC
  Filled 2012-04-25: qty 2

## 2012-04-25 MED ORDER — SUCRALFATE 1 G PO TABS: 1.0000 g | ORAL_TABLET | Freq: Three times a day (TID) | ORAL | Status: DC

## 2012-04-25 MED ORDER — DOXYCYCLINE HYCLATE 100 MG PO TABS: 100.0000 mg | ORAL_TABLET | Freq: Two times a day (BID) | ORAL | Status: AC

## 2012-04-25 NOTE — Progress Notes (Signed)
ANTICOAGULATION CONSULT NOTE   Pharmacy Consult for Warfarin/Lovenox Indication: atrial fibrillation  Allergies  Allergen Reactions  . Shellfish Allergy Anaphylaxis  . Iodine     Associated with the shellfish allergy    Patient Measurements: Height: 6\' 4"  (193 cm) Weight: 592 lb 3.2 oz (268.62 kg) IBW/kg (Calculated) : 86.8   Vital Signs: Temp: 97.4 F (36.3 C) (07/25 0542) Temp src: Oral (07/25 0542) BP: 124/73 mmHg (07/25 0544) Pulse Rate: 73  (07/25 0542)  Labs:  Basename 04/25/12 0645 04/24/12 1326 04/24/12 0430 04/23/12 0524  HGB -- -- 13.4 --  HCT -- -- 40.9 --  PLT -- -- 195 --  APTT -- -- -- --  LABPROT 21.5* -- 22.1* 19.6*  INR 1.83* -- 1.90* 1.63*  HEPARINUNFRC -- -- -- --  CREATININE 1.09 1.23 1.22 --  CKTOTAL -- -- -- --  CKMB -- -- -- --  TROPONINI -- -- -- --    Estimated Creatinine Clearance: 201.2 ml/min (by C-G formula based on Cr of 1.09).   Medications:  Scheduled:     . albuterol  2.5 mg Nebulization BID  . amiodarone  200 mg Oral Daily  . amLODipine  10 mg Oral Daily  . aspirin EC  81 mg Oral Daily  . buPROPion  300 mg Oral Daily  . digoxin  0.25 mg Oral Daily  . divalproex  1,000 mg Oral Daily  . docusate sodium  100 mg Oral BID  . doxycycline  100 mg Oral Q12H  . enoxaparin (LOVENOX) injection  135 mg Subcutaneous Q24H  . furosemide  40 mg Oral BID  . guaiFENesin  600 mg Oral BID  . insulin aspart  0-20 Units Subcutaneous TID WC  . insulin aspart  0-5 Units Subcutaneous QHS  . insulin glargine  20 Units Subcutaneous QHS  . ipratropium      . lamoTRIgine  100 mg Oral Daily  . metoprolol succinate  75 mg Oral Daily  . oxyCODONE  15 mg Oral Q6H  . pantoprazole  40 mg Oral BID AC  . polyethylene glycol  17 g Oral Daily  . risperiDONE  2 mg Oral QHS  . simvastatin  20 mg Oral q1800  . sodium chloride  3 mL Intravenous Q12H  . sodium chloride  3 mL Intravenous Q12H  . sucralfate  1 g Oral TID WC & HS  . warfarin  15 mg Oral  ONCE-1800  . Warfarin - Pharmacist Dosing Inpatient   Does not apply q1800  . DISCONTD: albuterol  2.5 mg Nebulization QID  . DISCONTD: levofloxacin  750 mg Oral QHS  . DISCONTD: potassium chloride  20 mEq Oral Daily   Assessment:  41 yo obese M with history of A.Fib, previous on warfarin but has not been taking since April (?insurance issues).  PTA (April) Patient was taking 10 M/W/F/Sun and 15 mg on Tues/Thur/Sat  Patient admitted 7/19 with chest pain and started on treatment dose lovenox for r/o ACS.  CEs were negative and Dr. Venetia Constable decreased Lovenox on 7/20 to weight adjust prophylactic dose while INR is below goal.  INR on admission was at baseline   Lovenox dose re-adjusted 7/22 for weight change  INR subtherapeutic; was responding, but decreased to 1.8 today despite doses given.  CBC:  Hgb and Plt stable/wnl.  Renal function wnl.  Noted DI: Amiodarone/Levaquin (Can cause increase in INR)  Goal of Therapy:  INR 2-3 Monitor platelets by anticoagulation protocol: Yes   Plan:   Continue Lovenox  135 mg SQ Q24hr  Warfarin 15 mg PO x 1 at 1800  Daily PT/INR   Lynann Beaver PharmD, BCPS Pager [phone] 04/25/2012 7:57 AM

## 2012-04-25 NOTE — Discharge Summary (Signed)
Physician Discharge Summary  [name]:[id number] DOB: 15-Feb-1971 DOA: 04/19/2012  PCP: Emeterio Reeve, MD  Admit date: 04/19/2012 Discharge date: 04/25/2012  Recommendations for Outpatient Follow-up:  1. Needs Ambulatory referral to Physical therapy 2. Patient needs a new chronic pain MD-he was seen at [facility], Dr. Vear Clock, in the past  Discharge Diagnoses:  Active Problems:  Chest pain  Morbid obesity with body mass index of 60.0-69.9 in adult  HTN (hypertension), benign  CHF (congestive heart failure)  OSA (obstructive sleep apnea)  A-fib  1. Chest pain  Discharge Condition: Good  Diet recommendation: Heart healthy low-salt  History of present illness:  [name] is a 41 y.o. male admitted on 04/19/12 with bilateral pleuritic chest pain, radiating to the back. CXR suggested possible congestion. However, due to weight limitations, CT chest could not be perfomed to better evaluate lung parencyma. He was hence managed for both acute diastolic chf congestion and for possible CAP. Cardiac enzymes were negative, and 2decho showed normal ef with grade 2 diastolic dysfunction. A d-dimer was negative. Cardiology graciously helped with his management. The pain is better, although there seems to be some lingering gerd component. He was resterted on coumadin, currently being overlapped with lovenox, for target inr 2-3.   Hospital Course:  .Chest pain- cardiac enzymes negative- pleuritic in nature, diastolic chf element, could not get ct chest due to weight limit. Pna (communtity aquired) versus cardiac versus GERD.  Added carafate. Switched to oral levaquin/lasix- Doxicycline when d/c am, given prolonged Qtc .HTN (hypertension), benign- Uncontrolled. Increased Toprol to 75 mg 7/23, On Amlodipine 10 mg-Add low dose thiazide if able to tolerate as outpatient .CHF (congestive heart failure) grade 2 diastolic- appreciate cardiology. Switched to oral lasix 40 bid .OSA  (obstructive sleep apnea)- Likely pulmonary htn. has CPAP. Continue.  Marland KitchenA-fib- rate controlled, on Amiodarone 200/Digoxin 0.25mg . Resumed coumadin. Appreciate pharmacy. Target INR 2-3, today 1.90.  Hyperkalemia-likely iatrogenic.  And was replaced in Hospital CKD stage 1-Needs follow up. Nausea-This is concurrent with his "dizzyness"  Chronic Pain-continue OxyIR 15 mg-will wean Morphine q 4 hr off completely. Patient informed me on day of discharge that he is seeing chronic pain physician.  This packs a question whether he actually had chest pain or did have back pain radiating to the stomach given he might have some vertebral insufficiency.  Unfortunately the only place that could take him for an MRI was [facility] but he apparently was to call for that although his weight was manageable.  I have encouraged him to speak with Dr. Laurine Blazer regarding referral to bariatric 6, and also to do core [facility] where he could get further imaging-it is unclear whether he has any pain medications and a given a limited prescription of medicine prior to discharge HLD-Continue Zocor 20 -U. is Depression-continue Risperdal 2 mf/Wellbutrin 300 XL  DM-well controlled. Sugars between 180-199-continue Lantus 20   Consultants:  Cardiology-Dr. Patty Sermons Procedures:  Chest x-ray 7/19 = mild cardiomegaly with severe pulmonary vascular congestion, no frank edema at this time  Urine culture = no growth Antibiotics:  Levofloxacin-750 mg 7/19->7/24  Azithromycin 7/19  Ceftriaxone 7/19  Doxicycline 7/24  Discharge Exam: Filed Vitals:   04/25/12 0544  BP: 124/73  Pulse:   Temp:   Resp:    Filed Vitals:   04/24/12 2051 04/25/12 0542 04/25/12 0544 04/25/12 0816  BP: 142/85 90/56 124/73   Pulse: 80 73    Temp: 97.9 F (36.6 C) 97.4 F (36.3 C)  TempSrc: Oral Oral    Resp: 19 18    Height:      Weight:   268.62 kg (592 lb 3.2 oz)   SpO2: 97% 100%  93%   General: Super morbid obese Caucasian male in no apparent  distress. Nystagmus to left eye with drooping right eyelid patient also has a Mallampatti of 4. Poor dentition. Thick neck  Cardiovascular: S1-S2 no murmur rub or gallop. Telel NSR at present. ?Huston Foley. No t wave elevations  Respiratory: Clinically clear no added sound  Abdomen: Obese nontender nondistended  Skin no rash  Neuro cranial nerves II through XII grossly intact  Discharge Instructions  Discharge Orders    Future Orders Please Complete By Expires   Ambulatory referral to Physical Therapy      Diet - low sodium heart healthy      Increase activity slowly      Call MD for:  temperature >100.4      Call MD for:  severe uncontrolled pain      Call MD for:  difficulty breathing, headache or visual disturbances      Call MD for:  extreme fatigue        Medication List  As of 04/25/2012 12:20 PM   STOP taking these medications         doxycycline 100 MG capsule         TAKE these medications         amiodarone 200 MG tablet   Commonly known as: PACERONE   Take 200 mg by mouth daily.      amLODipine 10 MG tablet   Commonly known as: NORVASC   daily.      aspirin 81 MG chewable tablet   Chew 324 mg by mouth once.      buPROPion 300 MG 24 hr tablet   Commonly known as: WELLBUTRIN XL   Take 1 tablet by mouth Daily.      digoxin 0.25 MG tablet   Commonly known as: LANOXIN   Take 1 tablet (0.25 mg total) by mouth daily.      divalproex 500 MG DR tablet   Commonly known as: DEPAKOTE   Take 1,000 mg by mouth daily.      doxycycline 100 MG tablet   Commonly known as: VIBRA-TABS   Take 1 tablet (100 mg total) by mouth every 12 (twelve) hours.      furosemide 40 MG tablet   Commonly known as: LASIX   Take 1 tablet (40 mg total) by mouth 2 (two) times daily.      HUMALOG MIX 75/25 (75-25) 100 UNIT/ML Susp   Generic drug: insulin lispro protamine-insulin lispro   Inject 20-75 Units into the skin Three times a day before meals. Takes 75 units before breakfast Takes 20  units before lunch Takes 65 units before supper      lamoTRIgine 100 MG tablet   Commonly known as: LAMICTAL   Take 100 mg by mouth daily.      metoprolol succinate 50 MG 24 hr tablet   Commonly known as: TOPROL-XL   Take 50 mg by mouth daily.      nitroGLYCERIN 0.4 MG SL tablet   Commonly known as: NITROSTAT   Place 0.4 mg under the tongue once.      oxyCODONE 15 MG immediate release tablet   Commonly known as: ROXICODONE   Take 1 tablet (15 mg total) by mouth every 4 (four) hours as needed for pain. For pain  risperiDONE 2 MG tablet   Commonly known as: RISPERDAL   Take 1 tablet by mouth Daily.      sucralfate 1 G tablet   Commonly known as: CARAFATE   Take 1 tablet (1 g total) by mouth 4 (four) times daily -  with meals and at bedtime.      VENTOLIN HFA 108 (90 BASE) MCG/ACT inhaler   Generic drug: albuterol   Inhale 2 puffs into the lungs every 6 (six) hours as needed. Shortness of breath      albuterol (2.5 MG/3ML) 0.083% nebulizer solution   Commonly known as: PROVENTIL   Take 2.5 mg by nebulization once.              The results of significant diagnostics from this hospitalization (including imaging, microbiology, ancillary and laboratory) are listed below for reference.    Significant Diagnostic Studies: Dg Chest 2 View  04/19/2012  *RADIOLOGY REPORT*  Clinical Data: Shortness of breath.  Chest pain.  CHEST - 2 VIEW  Comparison: Chest x-ray 04/14/2012.  Findings: Lung volumes are normal.  No consolidative airspace disease.  Severe pulmonary venous congestion, but no frank pulmonary edema at this time.  No acute consolidative airspace disease.  No pleural effusions.  Heart size is mildly enlarged (unchanged).  Mediastinal contours are unremarkable.  IMPRESSION: 1.  Mild cardiomegaly with severe pulmonary venous congestion, but no frank pulmonary edema at this time.  Original Report Authenticated By: Florencia Reasons, M.D.   Dg Chest Portable 1  View  04/14/2012  *RADIOLOGY REPORT*  Clinical Data: Chest pain.  Shortness of breath.  Cough.  PORTABLE CHEST - 1 VIEW  Comparison: 03/08/2011 and 11/09/2009  Findings: Cardiomegaly stable.  Mild elevation of the left hemidiaphragm is seen with mild left basilar atelectasis. Pulmonary vascular congestion is stable, and there is no evidence of frank pulmonary edema or acute infiltrate.  IMPRESSION:  1.  Stable cardiomegaly pulmonary venous hypertension. 2.  Elevated left hemidiaphragm with mild left basilar atelectasis.  Original Report Authenticated By: Danae Orleans, M.D.    Microbiology: Recent Results (from the past 240 hour(s))  URINE CULTURE     Status: Normal   Collection Time   04/20/12  2:37 AM      Component Value Range Status Comment   Specimen Description URINE, CLEAN CATCH   Final    Special Requests NONE   Final    Culture  Setup Time 04/20/2012 11:34   Final    Colony Count NO GROWTH   Final    Culture NO GROWTH   Final    Report Status 04/21/2012 FINAL   Final   CULTURE, EXPECTORATED SPUTUM-ASSESSMENT     Status: Normal   Collection Time   04/21/12  4:57 AM      Component Value Range Status Comment   Specimen Description SPUTUM   Final    Special Requests NONE   Final    Sputum evaluation     Final    Value: MICROSCOPIC FINDINGS SUGGEST THAT THIS SPECIMEN IS NOT REPRESENTATIVE OF LOWER RESPIRATORY SECRETIONS. PLEASE RECOLLECT.     CALLED TO DEBBIE/RN AT 0555 04/21/2012 BY R LINEBERRY   Report Status 04/21/2012 FINAL   Final      Labs: Basic Metabolic Panel:  Lab 04/25/12 1610 04/24/12 1326 04/24/12 0430 04/22/12 0454 04/21/12 0659 04/20/12 0358  NA 140 139 138 135 136 --  K 4.4 4.6 5.5* 4.7 4.3 --  CL 99 99 96 93* 94* --  CO2  34* 32 33* 29 33* --  GLUCOSE 186* 211* 180* 148* 177* --  BUN 21 27* 31* 31* 26* --  CREATININE 1.09 1.23 1.22 1.24 1.06 --  CALCIUM 9.0 8.8 9.0 9.2 9.2 --  MG -- -- 2.4 -- -- 2.0  PHOS -- -- 3.7 -- -- 4.9*   Liver Function Tests:  Lab  04/21/12 0659  AST 17  ALT 9  ALKPHOS 64  BILITOT 0.5  PROT 7.8  ALBUMIN 3.5   No results found for this basename: LIPASE:5,AMYLASE:5 in the last 168 hours No results found for this basename: AMMONIA:5 in the last 168 hours CBC:  Lab 04/24/12 0430 04/21/12 0659 04/19/12 1648  WBC 8.7 9.7 9.3  NEUTROABS -- -- 5.7  HGB 13.4 13.5 13.3  HCT 40.9 39.6 40.0  MCV 83.8 83.0 84.6  PLT 195 192 198   Cardiac Enzymes:  Lab 04/20/12 1215 04/20/12 0358 04/19/12 2045  CKTOTAL 110 98 106  CKMB 1.7 1.5 1.6  CKMBINDEX -- -- --  TROPONINI <0.30 <0.30 <0.30   BNP: BNP (last 3 results)  Basename 04/19/12 1648  PROBNP 83.1   CBG:  Lab 04/25/12 0702 04/24/12 2049 04/24/12 1736 04/24/12 1150 04/24/12 0746  GLUCAP 180* 156* 157* 184* 199*    Time coordinating discharge: 40  Signed:  Rhetta Mura  [facility] 04/25/2012, 12:20 PM

## 2012-04-26 LAB — GLUCOSE, CAPILLARY: Glucose-Capillary: 229 mg/dL — ABNORMAL HIGH (ref 70–99)

## 2012-05-24 ENCOUNTER — Other Ambulatory Visit: Payer: Self-pay | Admitting: Cardiology

## 2012-07-10 ENCOUNTER — Other Ambulatory Visit: Payer: Self-pay | Admitting: Cardiology

## 2012-07-11 ENCOUNTER — Other Ambulatory Visit: Payer: Self-pay | Admitting: Cardiology

## 2012-07-22 ENCOUNTER — Other Ambulatory Visit: Payer: Self-pay | Admitting: Cardiology

## 2012-07-24 ENCOUNTER — Other Ambulatory Visit: Payer: Self-pay | Admitting: Cardiology

## 2012-11-25 ENCOUNTER — Other Ambulatory Visit: Payer: Self-pay | Admitting: Cardiology

## 2013-01-24 ENCOUNTER — Other Ambulatory Visit: Payer: Self-pay | Admitting: Cardiology

## 2013-04-10 ENCOUNTER — Other Ambulatory Visit: Payer: Self-pay

## 2013-05-09 ENCOUNTER — Encounter (HOSPITAL_COMMUNITY): Payer: Self-pay | Admitting: Emergency Medicine

## 2013-05-09 ENCOUNTER — Emergency Department (HOSPITAL_COMMUNITY): Payer: Medicare Other

## 2013-05-09 ENCOUNTER — Emergency Department (HOSPITAL_COMMUNITY)
Admission: EM | Admit: 2013-05-09 | Discharge: 2013-05-09 | Disposition: A | Discharge status: Home / Self Care | Payer: Medicare Other | Attending: Emergency Medicine | Admitting: Emergency Medicine

## 2013-05-09 DIAGNOSIS — Z8679 Personal history of other diseases of the circulatory system: Secondary | ICD-10-CM | POA: Insufficient documentation

## 2013-05-09 DIAGNOSIS — Z872 Personal history of diseases of the skin and subcutaneous tissue: Secondary | ICD-10-CM | POA: Insufficient documentation

## 2013-05-09 DIAGNOSIS — Z8709 Personal history of other diseases of the respiratory system: Secondary | ICD-10-CM | POA: Insufficient documentation

## 2013-05-09 DIAGNOSIS — I4891 Unspecified atrial fibrillation: Secondary | ICD-10-CM | POA: Insufficient documentation

## 2013-05-09 DIAGNOSIS — R05 Cough: Secondary | ICD-10-CM | POA: Insufficient documentation

## 2013-05-09 DIAGNOSIS — J45901 Unspecified asthma with (acute) exacerbation: Secondary | ICD-10-CM | POA: Insufficient documentation

## 2013-05-09 DIAGNOSIS — I10 Essential (primary) hypertension: Secondary | ICD-10-CM | POA: Insufficient documentation

## 2013-05-09 DIAGNOSIS — R0789 Other chest pain: Secondary | ICD-10-CM | POA: Insufficient documentation

## 2013-05-09 DIAGNOSIS — I509 Heart failure, unspecified: Secondary | ICD-10-CM | POA: Insufficient documentation

## 2013-05-09 DIAGNOSIS — F329 Major depressive disorder, single episode, unspecified: Secondary | ICD-10-CM | POA: Insufficient documentation

## 2013-05-09 DIAGNOSIS — R059 Cough, unspecified: Secondary | ICD-10-CM | POA: Insufficient documentation

## 2013-05-09 DIAGNOSIS — F3289 Other specified depressive episodes: Secondary | ICD-10-CM | POA: Insufficient documentation

## 2013-05-09 DIAGNOSIS — E119 Type 2 diabetes mellitus without complications: Secondary | ICD-10-CM | POA: Insufficient documentation

## 2013-05-09 DIAGNOSIS — R079 Chest pain, unspecified: Secondary | ICD-10-CM

## 2013-05-09 DIAGNOSIS — Z79899 Other long term (current) drug therapy: Secondary | ICD-10-CM | POA: Insufficient documentation

## 2013-05-09 DIAGNOSIS — Z8669 Personal history of other diseases of the nervous system and sense organs: Secondary | ICD-10-CM | POA: Insufficient documentation

## 2013-05-09 LAB — CBC WITH DIFFERENTIAL/PLATELET
Basophils Absolute: 0 10*3/uL (ref 0.0–0.1)
HCT: 43 % (ref 39.0–52.0)
Lymphocytes Relative: 29 % (ref 12–46)
Lymphs Abs: 3.1 10*3/uL (ref 0.7–4.0)
MCV: 81.3 fL (ref 78.0–100.0)
Neutro Abs: 6.4 10*3/uL (ref 1.7–7.7)
Platelets: 220 10*3/uL (ref 150–400)
RBC: 5.29 MIL/uL (ref 4.22–5.81)
RDW: 15.2 % (ref 11.5–15.5)
WBC: 10.8 10*3/uL — ABNORMAL HIGH (ref 4.0–10.5)

## 2013-05-09 LAB — COMPREHENSIVE METABOLIC PANEL
ALT: 10 U/L (ref 0–53)
AST: 22 U/L (ref 0–37)
Alkaline Phosphatase: 59 U/L (ref 39–117)
CO2: 27 mEq/L (ref 19–32)
Chloride: 106 mEq/L (ref 96–112)
GFR calc Af Amer: >90 mL/min (ref >90)
GFR calc non Af Amer: >90 mL/min (ref >90)
Glucose, Bld: 51 mg/dL — ABNORMAL LOW (ref 70–99)
Sodium: 140 mEq/L (ref 135–145)
Total Bilirubin: 0.2 mg/dL — ABNORMAL LOW (ref 0.3–1.2)

## 2013-05-09 LAB — TROPONIN I: Troponin I: <0.3 ng/mL (ref <0.30)

## 2013-05-09 MED ORDER — HYDROCODONE-ACETAMINOPHEN 5-325 MG PO TABS
1.0000 | ORAL_TABLET | Freq: Once | ORAL | Status: AC
  Administered 2013-05-09: 1 via ORAL
  Filled 2013-05-09: qty 1

## 2013-05-09 MED ORDER — HYDROCODONE-ACETAMINOPHEN 5-325 MG PO TABS: 1.0000 | ORAL_TABLET | ORAL | Status: DC | PRN

## 2013-05-09 MED ORDER — MORPHINE SULFATE 4 MG/ML IJ SOLN: 4.0000 mg | Freq: Once | INTRAMUSCULAR | Status: DC

## 2013-05-09 NOTE — ED Notes (Signed)
Pt. Feeling better after eating.  Re-evaluate cbg.

## 2013-05-09 NOTE — ED Provider Notes (Signed)
CSN: [id number]     Arrival date & time 05/09/13  1825 History     None    Chief Complaint  Patient presents with  . Chest Pain   (Consider location/radiation/quality/duration/timing/severity/associated sxs/prior Treatment) HPI 42 year old male with a history of morbid obesity, CHF, hypertension, atrial fibrillation, diabetes, recommendation for home oxygen, presents with chest pain of one-month duration. Patient reports the chest pain has been present for one month and he is taking pain medicines and muscle relaxants prescribed by his PCP without relief. States the chest pain is the same but radiates to the back. Describes the pain as sharp, like someone is sitting on his chest, and constant. It is worse with deep breaths and when he leans forward or moves as well as with palpation. He has had chest pain radiating to his back before when he has had pneumonia. He has had coughing for the past 3 days productive of white sputum, and saw his PCP on Wednesday for chest pain and cough who gave him amoxicillin.  Also reports mild shortness of breath that has been slightly worse over the last 2 days. Denies any exertional chest pain or shortness of breath.  No increased orthopnea or leg swelling.  Denies associated nausea. Reports he had a stress test approximately 12 years ago, as well as other cardiac imaging done in 2011 however is unsure of the results. Notes both of his parents have had blood clots/DVTs. He denies any personal history of DVT/PE, and denies any recent surgery or immobilization.  Has been rx oxygen however does not use it at home given he does not have permanent address.  Past Medical History  Diagnosis Date  . CHF (congestive heart failure)   . HTN (hypertension)   . Ptosis   . Atrial flutter   . Atrial fibrillation   . Morbid obesity   . DM2 (diabetes mellitus, type 2)   . Depression   . Respiratory failure   . Chronic low back pain   . Diabetes mellitus   . Asthma   .  Vision abnormalities   . Abscess     right thigh   Past Surgical History  Procedure Laterality Date  . Tonsillectomy    . Cholecystectomy    . Tonsillectomy    . Irrigation and debridement abscess  10/13/2011    Procedure: IRRIGATION AND DEBRIDEMENT ABSCESS;  Surgeon: Emelia Loron, MD;  Location: [facility] OR;  Service: General;  Laterality: Right;  Irrigation and debridement right thigh abscess  . Application of wound vac      right thigh abscess   Family History  Problem Relation Age of Onset  . Coronary artery disease Mother     37s; father had it in 72s; both deceased in their 11s    . Cancer Mother     liver  . Heart disease Father   . Cancer Father     prostate   History  Substance Use Topics  . Smoking status: Never Smoker   . Smokeless tobacco: Not on file     Comment: no significant tobacco use   . Alcohol Use: Yes     Comment: no significant use    Review of Systems  Constitutional: Negative for fever.  HENT: Negative for sore throat and neck stiffness.   Eyes: Negative for visual disturbance.  Respiratory: Positive for cough. Negative for shortness of breath (mild).   Cardiovascular: Positive for chest pain. Negative for leg swelling.  Gastrointestinal: Negative for nausea, vomiting, abdominal pain  and constipation.  Genitourinary: Negative for difficulty urinating.  Musculoskeletal: Negative for back pain.  Skin: Negative for rash.  Neurological: Negative for syncope and headaches.    Allergies  Iodine and Shellfish allergy  Home Medications   Current Outpatient Rx  Name  Route  Sig  Dispense  Refill  . albuterol (VENTOLIN HFA) 108 (90 BASE) MCG/ACT inhaler   Inhalation   Inhale 2 puffs into the lungs every 6 (six) hours as needed. Shortness of breath         . amiodarone (PACERONE) 200 MG tablet   Oral   Take 200 mg by mouth daily.         Marland Kitchen amLODipine (NORVASC) 10 MG tablet      daily.         Marland Kitchen amoxicillin-clavulanate (AUGMENTIN)  875-125 MG per tablet   Oral   Take 1 tablet by mouth 2 (two) times daily.         Marland Kitchen aspirin 325 MG EC tablet   Oral   Take 1,300 mg by mouth once.          Marland Kitchen aspirin 81 MG chewable tablet   Oral   Chew 324 mg by mouth once.         Marland Kitchen buPROPion (WELLBUTRIN XL) 300 MG 24 hr tablet   Oral   Take 300 mg by mouth Daily.          . cyclobenzaprine (FLEXERIL) 10 MG tablet   Oral   Take 10 mg by mouth 3 (three) times daily as needed for muscle spasms.         . digoxin (LANOXIN) 0.25 MG tablet   Oral   Take 1 tablet (0.25 mg total) by mouth daily.   30 tablet   3     Please contact to schedule a appointment for futur ...   . divalproex (DEPAKOTE) 500 MG DR tablet   Oral   Take 1,000 mg by mouth at bedtime.          Marland Kitchen HUMALOG MIX 75/25 (75-25) 100 UNIT/ML SUSP   Subcutaneous   Inject 65-75 Units into the skin Three times a day before meals. Takes 75 units before breakfast Takes 65 units before lunch Takes 75 units before supper         . lamoTRIgine (LAMICTAL) 100 MG tablet   Oral   Take 100 mg by mouth daily.          . metoprolol (TOPROL-XL) 50 MG 24 hr tablet   Oral   Take 50 mg by mouth daily.         . nitroGLYCERIN (NITROSTAT) 0.4 MG SL tablet   Sublingual   Place 0.4 mg under the tongue once.         . risperiDONE (RISPERDAL) 2 MG tablet   Oral   Take 2 mg by mouth Daily.          . traMADol (ULTRAM) 50 MG tablet   Oral   Take 50-100 mg by mouth every 6 (six) hours as needed for pain.          BP 165/90  Pulse 94  Ht 6\' 3"  (1.905 m)  Wt 597 lb 4 oz (270.911 kg)  BMI 74.65 kg/m2  SpO2 96% Physical Exam  Nursing note and vitals reviewed. Constitutional: He is oriented to person, place, and time. He appears well-developed and well-nourished. No distress.  Morbidly obese   HENT:  Head: Normocephalic and atraumatic.  Mouth/Throat: Oropharynx  is clear and moist. No oropharyngeal exudate.  Eyes: Conjunctivae and EOM are normal.   Neck: Normal range of motion.  Cardiovascular: Normal rate, regular rhythm, normal heart sounds and intact distal pulses.  Exam reveals no gallop and no friction rub.   No murmur heard. Pulmonary/Chest: Effort normal and breath sounds normal. No respiratory distress. He has no wheezes. He has no rales. He exhibits tenderness.  Abdominal: Soft. He exhibits no distension. There is no tenderness. There is no guarding.  Musculoskeletal: He exhibits edema (nonpitting). He exhibits no tenderness.       Thoracic back: He exhibits tenderness (left side).  Neurological: He is alert and oriented to person, place, and time.  Skin: Skin is warm and dry. He is not diaphoretic.    ED Course   Procedures (including critical care time)  Labs Reviewed  TROPONIN I  CBC WITH DIFFERENTIAL  COMPREHENSIVE METABOLIC PANEL   No results found. No diagnosis found.  MDM  42 year old male with a history of morbid obesity, CHF, hypertension, atrial fibrillation, diabetes, recommendation for home oxygen, presents with chest pain of one-month duration. Given one month of symptoms of primarily chest pain, have low suspicion for ACS, PE, aortic dissection, pneumonia, pneumothorax. EKG was done which was compared to previous and showed him all sinus rhythm without acute T. or ST changes to suggest pericarditis or ischemia, and was similar to previous with poor R-wave progression. Chest x-ray was done to evaluate for pneumonia, CHF, and showed stable cardiomegaly and mild pulmonary vascular congestion without signs of other pulmonary edema. Low suspicion for CHF exacerbation and given primary symptom of chest pain, without significant increase in shortness of breath, leg swelling, or orthopnea. D. dimer was done to evaluate for PE risk factors and was negative. Initial troponin negative, and given duration of symptoms for one month deferred repeat troponin. Patient with significant chest wall tenderness as well as back  tenderness on exam, it is suspect musculoskeletal chest pain as evaluated by his primary care doctor as possible cause. Given a prescription for Norco, and recommended close followup with his primary care physician.  Patient with rx for O2 however normal O2 saturation on room air prior to discharge. Patient discharged in stable condition with understanding of reasons to return. Care discussed with Dr. Ethelda Chick.  Rhae Lerner, MD 05/10/13 [phone]

## 2013-05-09 NOTE — ED Notes (Signed)
Patient states he is here due to he has had chest pain for a month. Has become more sever today.

## 2013-05-09 NOTE — ED Notes (Signed)
Patient given meal bag, graham crackers and orange juice for BS of 51,

## 2013-05-09 NOTE — ED Provider Notes (Signed)
Patient reports he's had chest pain for one month, constant, unchanging, pleuritic, worse with changing positions. He denies the pain is different today states he came to the emergency department because "I'm just tired of it" he went to his primary care physician last week was prescribed Flexeril and tramadol, without adequate pain relief. On exam alert nontoxic morbidly obese heart regular rate and rhythm chest is exquisitely tender pain is reproducible by forcible abduction of either shoulder. Medical decision-making strongly doubt acute coronary syndrome with highly atypical symptoms. Pain is easily reproducible. Doubt pulmonary embolism pain gradual in onset, easily reproducible. Symptoms and exam compatible with musculoskeletal chest pain  Doug Sou, MD 05/09/13 2302

## 2013-05-10 NOTE — ED Provider Notes (Signed)
I have personally seen and examined the patient.  I have discussed the plan of care with the resident.  I have reviewed the documentation on PMH/FH/Soc. History.  I have reviewed the documentation of the resident and agree.  Doug Sou, MD 05/10/13 1454

## 2013-06-06 ENCOUNTER — Other Ambulatory Visit: Payer: Self-pay | Admitting: Cardiology

## 2013-06-11 ENCOUNTER — Encounter: Payer: Medicare Other | Attending: Family Medicine | Admitting: *Deleted

## 2013-06-11 ENCOUNTER — Encounter: Payer: Self-pay | Admitting: *Deleted

## 2013-06-11 VITALS — Ht 75.0 in | Wt >= 6400 oz

## 2013-06-11 DIAGNOSIS — Z713 Dietary counseling and surveillance: Secondary | ICD-10-CM | POA: Insufficient documentation

## 2013-06-11 DIAGNOSIS — E119 Type 2 diabetes mellitus without complications: Secondary | ICD-10-CM | POA: Insufficient documentation

## 2013-06-11 DIAGNOSIS — E669 Obesity, unspecified: Secondary | ICD-10-CM | POA: Insufficient documentation

## 2013-06-11 NOTE — Progress Notes (Signed)
Appt start time: 1200 end time:  1300.  Assessment:  Patient was seen on  06/11/13 for individual diabetes education. Diagnosed about 12 years ago. Is living with your cousin right now, who is a major support in his live. He has lost 30 pounds in last 42 days with better eating habits and increase activity level. He SMBG 4-6 times a day with reported range of 60-160 including before and after meals. He states his A1c has dropped to 7.1% as of last week. He feels very motivated to improve his health with weight loss. Was very active in high school with running and other sports that he could do with visual impairment.  Current HbA1c:  13.9% on referral, patient states it was taken last week and it had come down to 7.1%  MEDICATIONS: see list. Diabetes medication is Humalog 75/25 @ 75 in AM. 20 mid day and 75 in PM although with the recent weight loss he is not taking any insulin anymore.    DIETARY INTAKE:  Usual eating pattern includes 3 meals and 2 snacks per day.  Everyday foods include good variety of all food groups now.  Avoided foods include high fat and high sugar foods.    24-hr recall:  B (8 AM): 3 sausage links and 2 eggs,OR 1 cup Raisin Bran with  Milk,  water  Snk ( AM): SF popsicle OR PNB with crackers OR 1/2 banana  L (2 PM): sandwich and soup OR 2 sandwiches with lean meat OR salad with protein added, [place] or light [place] dressing, eater Snk ( PM): same as AM D (7 PM): lean meat x 1-2 servings, starch, vegetables, water Snk ( PM): only if BG is too low before bedtime, PNB to hold over night Beverages: water  Usual physical activity: walking outside rain or shine, on trail near his home currently 30 minutes each way  Estimated energy needs: 2400 calories 270 g carbohydrates 180 g protein 67 g fat  Progress Towards Goal(s):  In progress.   Nutritional Diagnosis:  NB-1.1 Food and nutrition-related knowledge deficit As related to diabetes control.  As evidenced by  A1c of 13.9% at diagnosis, now reduced to 7.1%.    Intervention:  Nutrition counseling provided.  Discussed diabetes disease process and treatment options.  Discussed physiology of diabetes and role of obesity on insulin resistance.  Encouraged moderate weight reduction to improve glucose levels.  Discussed role of medications and diet in glucose control  Provided education on macronutrients on glucose levels.  Provided education on carb counting, importance of regularly scheduled meals/snacks, and meal planning  Discussed effects of physical activity on glucose levels and long-term glucose control.  Commended him on his 150 minutes of physical activity/week.  Reviewed patient medications.  Discussed role of medication on blood glucose and possible side effects  Discussed blood glucose monitoring and interpretation.  Discussed recommended target ranges and individual ranges.    Described short-term complications: hyper- and hypo-glycemia.  Discussed causes,symptoms, and treatment options.  Discussed prevention, detection, and treatment of long-term complications.  Discussed the role of prolonged elevated glucose levels on body systems.  Plan to discuss:  Role of stress on blood glucose levels and discussed strategies to manage psychosocial issues.  Recommendations for long-term diabetes self-care.  Established checklist for medical, dental, and emotional self-care.  Plan:  Aim for 4 Carb Choices per meal (60 grams) +/- 1 either way  Aim for 0-2 Carbs per snack if hungry  Consider reading food labels for Total  Carbohydrate of foods Continue with your activity level by walking for 30-60 minutes daily as tolerated Continue checking BG daily as directed by MD    Handouts given during visit include: Living Well with Diabetes Carb Counting and Food Label handouts Meal Plan Card  Barriers to learning/adherance to lifestyle change: morbid obesity, visual impairment  Diabetes self-care  support plan:   [facility] support group  Continued diabetes education  Monitoring/Evaluation:  Dietary intake, exercise, reading food labels, and body weight in 6 week(s). Patient to call for appointment.

## 2013-06-11 NOTE — Patient Instructions (Addendum)
Plan:  Aim for 4 Carb Choices per meal (60 grams) +/- 1 either way  Aim for 0-2 Carbs per snack if hungry  Consider reading food labels for Total Carbohydrate of foods Continue with your activity level by walking for 30-60 minutes daily as tolerated Continue checking BG daily as directed by MD

## 2013-06-12 ENCOUNTER — Other Ambulatory Visit: Payer: Self-pay | Admitting: Cardiology

## 2013-06-17 ENCOUNTER — Encounter: Payer: Self-pay | Admitting: *Deleted

## 2013-06-19 ENCOUNTER — Encounter: Payer: Self-pay | Admitting: Cardiology

## 2013-06-19 ENCOUNTER — Ambulatory Visit (INDEPENDENT_AMBULATORY_CARE_PROVIDER_SITE_OTHER): Payer: Medicare Other | Admitting: Cardiology

## 2013-06-19 VITALS — BP 124/82 | HR 86 | Ht 75.0 in | Wt >= 6400 oz

## 2013-06-19 DIAGNOSIS — R079 Chest pain, unspecified: Secondary | ICD-10-CM

## 2013-06-19 DIAGNOSIS — I4891 Unspecified atrial fibrillation: Secondary | ICD-10-CM

## 2013-06-19 DIAGNOSIS — I10 Essential (primary) hypertension: Secondary | ICD-10-CM

## 2013-06-19 DIAGNOSIS — I4892 Unspecified atrial flutter: Secondary | ICD-10-CM

## 2013-06-19 DIAGNOSIS — Z6841 Body Mass Index (BMI) 40.0 and over, adult: Secondary | ICD-10-CM

## 2013-06-19 NOTE — Assessment & Plan Note (Signed)
Blood pressure controlled. Continue present medications. 

## 2013-06-19 NOTE — Assessment & Plan Note (Signed)
No recent chest pain; given wt, not a candidate for further evaluation (too large for cath, CT, nuclear study; images not adequate for dobutamine echo).

## 2013-06-19 NOTE — Assessment & Plan Note (Signed)
Patient remains in sinus rhythm. Continue amiodarone. Recent liver functions and chest x-ray in August. He also had a TSH at his endocrinologist office. We will ask for those records. Multiple embolic risk factors. He discontinued Coumadin previously as he did not want to come for INR checks. I have given him the name of apixaban. If his insurance covers we will discontinue aspirin and begin this medication.

## 2013-06-19 NOTE — Assessment & Plan Note (Signed)
Patient counseled on weight loss and he is working on this issue.

## 2013-06-19 NOTE — Patient Instructions (Addendum)
Your physician wants you to follow-up in: 6 MONTHS WITH DR Jens Som You will receive a reminder letter in the mail two months in advance. If you don't receive a letter, please call our office to schedule the follow-up appointment.   ELIQUIS TO REPLACE WARFARIN

## 2013-06-19 NOTE — Assessment & Plan Note (Signed)
See discussion under atrial fibrillation.

## 2013-06-19 NOTE — Progress Notes (Signed)
HPI: FU chest pain and paoxysmal atrial fibrillation. He underwent a TEE guided cardioversion of atrial flutter 10/10 successfully. Note the TEE revealed normal LV and RV function. He was seen by EP and amiodarone was recommended to help maintain sinus rhythm. Multiple previous admissions for atypical chest pain. Last echocardiogram in July of 2013 showed normal LV function, moderate left ventricular hypertrophy, grade 2 diastolic dysfunction, mild left atrial enlargement and trace mitral/tricuspid regurgitation. Seen in July and August of 2014 with chest pain. Last Hemoglobin 15.1. D-dimer normal, troponin negative, liver functions normal and renal function normal. Patient also with history of diastolic CHF. I have not seen him since 2011. He has some dyspnea on exertion but no orthopnea, PND or pedal edema. He has not had recent chest pain.  Current Outpatient Prescriptions  Medication Sig Dispense Refill  . albuterol (VENTOLIN HFA) 108 (90 BASE) MCG/ACT inhaler Inhale 2 puffs into the lungs every 6 (six) hours as needed. Shortness of breath      . amiodarone (PACERONE) 200 MG tablet Take 200 mg by mouth daily.      Marland Kitchen amLODipine (NORVASC) 10 MG tablet daily.      Marland Kitchen aspirin 325 MG EC tablet Take 325 mg by mouth every 6 (six) hours as needed.       Marland Kitchen buPROPion (WELLBUTRIN XL) 300 MG 24 hr tablet Take 300 mg by mouth Daily.       . cyclobenzaprine (FLEXERIL) 10 MG tablet Take 10 mg by mouth 3 (three) times daily as needed for muscle spasms.      . digoxin (LANOXIN) 0.25 MG tablet Take 1 tablet (0.25 mg total) by mouth daily.  30 tablet  3  . HUMALOG MIX 75/25 (75-25) 100 UNIT/ML SUSP Inject 65-75 Units into the skin Three times a day before meals. Takes 75 units before breakfast Takes 65 units before lunch Takes 75 units before supper      . HYDROcodone-acetaminophen (NORCO/VICODIN) 5-325 MG per tablet Take 1 tablet by mouth every 4 (four) hours as needed for pain.  12 tablet  0  . lamoTRIgine  (LAMICTAL) 100 MG tablet Take 100 mg by mouth daily.       . metoprolol (TOPROL-XL) 50 MG 24 hr tablet Take 50 mg by mouth daily.      . nitroGLYCERIN (NITROSTAT) 0.4 MG SL tablet Place 0.4 mg under the tongue once.      . risperiDONE (RISPERDAL) 2 MG tablet Take 2 mg by mouth Daily.       . traMADol (ULTRAM) 50 MG tablet Take 50-100 mg by mouth every 6 (six) hours as needed for pain.      . divalproex (DEPAKOTE) 500 MG DR tablet Take 1,000 mg by mouth at bedtime.        No current facility-administered medications for this visit.     Past Medical History  Diagnosis Date  . CHF (congestive heart failure)   . HTN (hypertension)   . Ptosis   . Atrial flutter   . Atrial fibrillation   . Morbid obesity   . DM2 (diabetes mellitus, type 2)   . Depression   . Respiratory failure   . Chronic low back pain   . Diabetes mellitus   . Asthma   . Vision abnormalities   . Abscess     right thigh    Past Surgical History  Procedure Laterality Date  . Tonsillectomy    . Cholecystectomy    . Tonsillectomy    .  Irrigation and debridement abscess  10/13/2011    Procedure: IRRIGATION AND DEBRIDEMENT ABSCESS;  Surgeon: Emelia Loron, MD;  Location: [facility] OR;  Service: General;  Laterality: Right;  Irrigation and debridement right thigh abscess  . Application of wound vac      right thigh abscess    History   Social History  . Marital Status: Divorced    Spouse Name: N/A    Number of Children: N/A  . Years of Education: N/A   Occupational History  . Not on file.   Social History Main Topics  . Smoking status: Never Smoker   . Smokeless tobacco: Not on file     Comment: no significant tobacco use   . Alcohol Use: Yes     Comment: no significant use  . Drug Use: No  . Sexual Activity: Not on file   Other Topics Concern  . Not on file   Social History Narrative   Lives in [place]; works full time at [employer].    Occasionally takes echinacea and B12 supplements.    Regular  diet; has not exercised regularly over the last 3 months.     ROS: no fevers or chills, productive cough, hemoptysis, dysphasia, odynophagia, melena, hematochezia, dysuria, hematuria, rash, seizure activity, orthopnea, PND, pedal edema, claudication. Remaining systems are negative.  Physical Exam: Well-developed morbidly obese in no acute distress.  Skin is warm and dry.  HEENT is normal.  Neck is supple.  Chest is clear to auscultation with normal expansion.  Cardiovascular exam is regular rate and rhythm.  Abdominal exam nontender or distended. No masses palpated. Extremities show no edema. neuro grossly intact  ECG sinus rhythm at a rate of 86. Poor R wave progression. Cannot rule out prior septal infarct. Right axis deviation.

## 2013-06-20 ENCOUNTER — Encounter: Payer: Self-pay | Admitting: Cardiology

## 2013-06-24 ENCOUNTER — Ambulatory Visit (INDEPENDENT_AMBULATORY_CARE_PROVIDER_SITE_OTHER): Payer: Medicare Other | Admitting: Pulmonary Disease

## 2013-06-24 ENCOUNTER — Encounter: Payer: Self-pay | Admitting: Pulmonary Disease

## 2013-06-24 VITALS — BP 142/100 | HR 81 | Temp 97.9°F | Ht 75.0 in | Wt >= 6400 oz

## 2013-06-24 DIAGNOSIS — R079 Chest pain, unspecified: Secondary | ICD-10-CM

## 2013-06-24 NOTE — Assessment & Plan Note (Signed)
The patient is describing atypical chest discomfort that is sharp in nature and does have some pleuritic component.  Unfortunately, he is unable to have a ventilation perfusion scan or CT angiography because of his massive obesity.  At least his d-dimer was normal recently.  He has been on anticoagulation for quite some time, but discontinued the end of last year.  Apparently, cardiology is starting him on anticoagulation again ASAP.  More than likely, his chest discomfort is musculoskeletal in origin, but I support chronic anticoagulation given his risk for thromboembolic disease from his massive obesity.  It may be worth scanning his legs to see if he has DVT, and if not, his chest discomfort is unlikely to be from thromboembolic disease.  He has significant shortness of breath, but this is not unexpected given his massive obesity.  His lungs are totally clear today on exam.

## 2013-06-24 NOTE — Patient Instructions (Addendum)
Will check ultrasound of your legs to evaluate for blood clots. Get with your cardiologist and start on your blood thinners. Keep working on weight loss. If your leg ultrasound is normal, more than likely this is musculoskeletal pain.

## 2013-06-24 NOTE — Progress Notes (Signed)
  Subjective:    Patient ID: [name], male    DOB: [birth year], 42 y.o.   MRN: [id number]  HPI The patient is a 42 year old male who I've been asked to see for dyspnea and atypical chest discomfort.  The patient has massive obesity, and starting in May of this year began to develop a sharp chest discomfort that was definitely worsened with respirations.  It is also worsened with lifting his arms and moving around.  The patient states he can feel the discomfort even when he is not breathing.  It is primarily in the right mid scapular line in the paraspinal area about half way down from the shoulder.  He has been seen in the emergency room where his saturations were adequate, and his d-dimer was normal.  His chest x-ray showed cardiomegaly with vascular congestion. The patient has been on Coumadin since 2010 until the end of last year.  He has not been on anticoagulation since that time.  He is currently on amiodarone.  His echo last year showed diastolic dysfunction.  He has no history of thromboembolic disease.  Of note, the patient states that his breathing is no worse and no better since the development of this chest discomfort.  He also has a history of severe obstructive sleep apnea for which he is on CPAP currently.   Review of Systems  Constitutional: Positive for unexpected weight change ( decrease in weight). Negative for fever.  HENT: Positive for sore throat. Negative for ear pain, nosebleeds, congestion, rhinorrhea, sneezing, trouble swallowing, dental problem, postnasal drip and sinus pressure.   Eyes: Negative for redness and itching.  Respiratory: Positive for cough ( productive) and shortness of breath. Negative for chest tightness and wheezing.   Cardiovascular: Positive for chest pain ( right sided). Negative for palpitations and leg swelling.  Gastrointestinal: Negative for nausea and vomiting.  Genitourinary: Negative for dysuria.  Musculoskeletal: Negative for joint swelling.   Skin: Negative for rash.  Neurological: Negative for headaches.  Hematological: Does not bruise/bleed easily.  Psychiatric/Behavioral: Positive for dysphoric mood. The patient is nervous/anxious.        Objective:   Physical Exam Constitutional:  Massive obesity, no acute distress  HENT:  Nares patent without discharge  Oropharynx without exudate, palate and uvula are mildly elongated.  Eyes:  Perrla, eomi, no scleral icterus  Neck:  Too large to examine for JVD, LN, TMG  Cardiovascular:  Normal rate, regular rhythm, no rubs or gallops.  No murmurs        Intact distal pulses but decreased.  Pulmonary :  Normal breath sounds, no stridor or respiratory distress   No rales, rhonchi, or wheezing  Abdominal:  Soft, nondistended, bowel sounds present.  No tenderness noted.   Musculoskeletal:  2+ lower extremity edema noted.  Lymph Nodes:  No cervical lymphadenopathy noted  Skin:  No cyanosis noted  Neurologic:  Alert, appropriate, moves all 4 extremities without obvious deficit.         Assessment & Plan:

## 2013-06-25 ENCOUNTER — Ambulatory Visit (HOSPITAL_COMMUNITY)
Admission: RE | Admit: 2013-06-25 | Discharge: 2013-06-25 | Disposition: A | Discharge status: Home / Self Care | Payer: Medicare Other | Source: Ambulatory Visit | Attending: Pulmonary Disease | Admitting: Pulmonary Disease

## 2013-06-25 DIAGNOSIS — I4891 Unspecified atrial fibrillation: Secondary | ICD-10-CM | POA: Insufficient documentation

## 2013-06-25 DIAGNOSIS — Z7901 Long term (current) use of anticoagulants: Secondary | ICD-10-CM | POA: Insufficient documentation

## 2013-06-25 DIAGNOSIS — R0602 Shortness of breath: Secondary | ICD-10-CM

## 2013-06-25 DIAGNOSIS — M7989 Other specified soft tissue disorders: Secondary | ICD-10-CM | POA: Insufficient documentation

## 2013-06-25 DIAGNOSIS — R079 Chest pain, unspecified: Secondary | ICD-10-CM | POA: Insufficient documentation

## 2013-06-25 NOTE — Progress Notes (Signed)
Bilateral lower extremity venous duplex:  No obvious evidence of DVT, superficial thrombosis, or Baker's Cyst.  Technically difficult study due to the patient's body habitus.   

## 2013-06-26 NOTE — Progress Notes (Signed)
Quick Note:  Advised pt of results per Dr. Shelle Iron. Pt verbalized understanding and has no further questions at this time ______

## 2013-07-09 ENCOUNTER — Encounter: Payer: Medicare Other | Attending: Family Medicine | Admitting: *Deleted

## 2013-07-09 DIAGNOSIS — E669 Obesity, unspecified: Secondary | ICD-10-CM | POA: Insufficient documentation

## 2013-07-09 DIAGNOSIS — E119 Type 2 diabetes mellitus without complications: Secondary | ICD-10-CM

## 2013-07-09 DIAGNOSIS — Z713 Dietary counseling and surveillance: Secondary | ICD-10-CM | POA: Insufficient documentation

## 2013-07-09 NOTE — Progress Notes (Signed)
Appt start time: 1130 end time:  1200.  Assessment:  Patient was seen on  07/09/13 for individual diabetes education and obesity follow up visit. He is happy with 10 pound weight loss over past 3 weeks. He is doing well with more stable meals with average of 4 Carb Choices at each meal. He is walking aggressively for up to 4 miles every day or every other day and notices no more pain with walking! The trail he walks on has changes in elevation and is enjoying the variety. 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   Current HbA1c:  13.9% on referral, patient states it was taken last week and it had come down to 7.1%  MEDICATIONS: see list. Diabetes medication is Humalog 75/25 @ 75 in AM. 20 mid day and 75 in PM although with the recent weight loss he is not taking any insulin anymore.    DIETARY INTAKE:  Usual eating pattern includes 3 meals and 2 snacks per day.  Everyday foods include good variety of all food groups now.  Avoided foods include high fat and high sugar foods.    24-hr recall:  B (8 AM): 3 sausage links and 2 eggs,OR 1 cup Raisin Bran with  Milk,  water  Snk ( AM): SF popsicle OR PNB with crackers OR 1/2 banana  L (2 PM): sandwich and soup OR 2 sandwiches with lean meat OR salad with protein added, [place] or light [place] dressing, eater Snk ( PM): same as AM D (7 PM): lean meat x 1-2 servings, starch, vegetables, water Snk ( PM): only if BG is too low before bedtime, PNB to hold over night Beverages: water  Usual physical activity: walking outside rain or shine, on trail near his home currently 30 minutes each way  Estimated energy needs: 2400 calories 270 g carbohydrates 180 g protein 67 g fat  Progress Towards Goal(s):  In progress.   Nutritional Diagnosis:  NB-1.1 Food and nutrition-related knowledge deficit As related to diabetes control.  As evidenced by A1c of  13.9% at diagnosis, now reduced to 7.1%.    Intervention:  Nutrition counseling continued. Commended him on his continued weight loss based on his behaviors of increased activity on a daily basis and reduced food intake in a balanced and healthy way. Also discussed relationship of calories to the macronutrients and reviewed reading the Food Label. Although he is visually impaired, he is able to read very close to his face.  Plan:  Continue to aim for 4 Carb Choices per meal (60 grams) +/- 1 either way  Continue to aim for 0-2 Carbs per snack if hungry  Consider reading food lbels for Total Carbohydrate of foods Continue with your activity level by walking for 30-60 minutes daily as tolerated Continue checking BG daily as directed by MD  Your weight today was 558.9 pounds!    Handouts given during visit include: [employer] [facility] [employer] Carb Counting and Food Label handouts  Barriers to learning/adherance to lifestyle change: morbid obesity, visual impairment  Diabetes self-care support plan:   [facility] support group  Continued diabetes education  Monitoring/Evaluation:  Dietary intake, exercise, reading food labels, and body weight in 4 week(s).

## 2013-07-09 NOTE — Patient Instructions (Signed)
Plan:  Continue to aim for 4 Carb Choices per meal (60 grams) +/- 1 either way  Continue to aim for 0-2 Carbs per snack if hungry  Consider reading food lbels for Total Carbohydrate of foods Continue with your activity level by walking for 30-60 minutes daily as tolerated Continue checking BG daily as directed by MD  Your weight today was 558.9 pounds!

## 2013-08-06 ENCOUNTER — Ambulatory Visit: Payer: Medicare Other | Admitting: *Deleted

## 2013-08-13 ENCOUNTER — Encounter: Payer: Medicare Other | Attending: Family Medicine | Admitting: *Deleted

## 2013-08-13 ENCOUNTER — Encounter: Payer: Self-pay | Admitting: *Deleted

## 2013-08-13 VITALS — Ht 75.0 in | Wt >= 6400 oz

## 2013-08-13 DIAGNOSIS — E669 Obesity, unspecified: Secondary | ICD-10-CM | POA: Insufficient documentation

## 2013-08-13 DIAGNOSIS — Z713 Dietary counseling and surveillance: Secondary | ICD-10-CM | POA: Insufficient documentation

## 2013-08-13 DIAGNOSIS — E119 Type 2 diabetes mellitus without complications: Secondary | ICD-10-CM

## 2013-08-13 NOTE — Progress Notes (Signed)
Appt start time: 1500 end time:  1600.  Assessment:  Patient was seen on  08/13/13 for individual diabetes follow up. Member presents having gained approximately 10#. He is expressing some significant stressors in his life. He has been eating larger portions that he knows is advised making some bad choices. Remains motivated to lose much more weight. Ideally down to 300# by 10/01/2014. "I know I can do it".  SOCIAL: He presently lives with his cousin. He feels he is being used by his cousin to obtain resources through his disability. He is attending community college and hope to obtain his degree next year. 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 Kris is in a state depression and notes this is the root of many of his issues. DIABETES:  Presently testing 4-6 times per day  FBS range 70-160.  With his excess eating he has noted some readings >200mg /dl. FBS this AM 69mg /dl and symptomatic. Yesterday his before lunch reading was approx. 110 so he did not take his noon insulin. However that night he was up to 262. I encouraged him to take his insulin as directed in order to cover his up coming meal.       Current HbA1c: 7.1%     Preferred Learning Style:   No preference indicated   Learning Readiness:   Change in progress  MEDICATIONS: See List Humalog 75/25  AM 75units, 20units lunch, 75units dinner. (lunch dose has been decreased from 40units)  DIETARY INTAKE:  Usual eating pattern includes 3 meals and 2 snacks per day.  Everyday foods include all food groups.      Usual physical activity: walking 30 minutes (2.64miles) 3X Week    Intervention:  Nutrition counseling provided.   Encouraged moderate weight reduction to improve glucose levels.    Provided education on macronutrients on glucose levels.  Provided education on carb counting, importance of regularly scheduled meals/snacks, and meal planning  Discussed effects of physical activity on glucose levels  and long-term glucose control.  Recommended 150 minutes of physical activity/week.  Reviewed patient medications.  Discussed role of medication on blood glucose and possible side effects  Discussed blood glucose monitoring and interpretation.  Discussed recommended target ranges and individual ranges.    Discussed role of stress on blood glucose levels and discussed strategies to manage psychosocial issues.  PLAN: EXERCISE: Has application for the [employer]. Is going to apply for scholarship due to disability. Wants to work into water work STRESS ZOX:WRUEAVW triggers of stress reaction.Marland KitchenMarland KitchenWhen you feel stressed and depressed go outside and walk, find a way to break the cycle of needing to eat. SOCIAL: Go to church regularly. Very faith based.  Teaching Method Utilized:  Auditory  Barriers to learning/adherence to lifestyle change: Depression and economics  Diabetes self-care support plan:   [facility] support group  Monitoring/Evaluation:  Dietary intake, exercise, glucose monitor, and body weight in 1 month(s) with Meriam Sprague.

## 2013-09-16 ENCOUNTER — Ambulatory Visit: Payer: Medicare Other | Admitting: *Deleted

## 2013-10-23 ENCOUNTER — Ambulatory Visit: Payer: Self-pay | Admitting: Cardiology

## 2013-10-23 DIAGNOSIS — Z7901 Long term (current) use of anticoagulants: Secondary | ICD-10-CM

## 2013-10-23 DIAGNOSIS — I4892 Unspecified atrial flutter: Secondary | ICD-10-CM

## 2013-10-23 DIAGNOSIS — I4891 Unspecified atrial fibrillation: Secondary | ICD-10-CM

## 2013-12-10 ENCOUNTER — Encounter: Payer: Self-pay | Admitting: *Deleted

## 2013-12-10 ENCOUNTER — Encounter: Payer: Medicare Other | Attending: Family Medicine | Admitting: *Deleted

## 2013-12-10 DIAGNOSIS — E119 Type 2 diabetes mellitus without complications: Secondary | ICD-10-CM | POA: Insufficient documentation

## 2013-12-10 DIAGNOSIS — E669 Obesity, unspecified: Secondary | ICD-10-CM | POA: Insufficient documentation

## 2013-12-10 DIAGNOSIS — Z713 Dietary counseling and surveillance: Secondary | ICD-10-CM | POA: Insufficient documentation

## 2013-12-10 NOTE — Progress Notes (Signed)
Appt start time: 0800 end time:  0830.  Assessment:  Patient was seen on  12/10/13 for individual diabetes education and obesity follow up visit. [place] and went off plan, doing much better now; Aiming for 5 Carbs per meal. Continues walking even in the snow with his dog. SMBG 4-6 times a day with range of 79-145 mg/dl including pre and post meals.He just saw his endo and learned his A1C has dropped to 7.0%. He states that he is dropping his insulin doses almost in half to prevent low blood sugars.    Current HbA1c:  13.9% on referral, patient states it was taken last week and it had come down to 7.1%. Now down to 7.0%.  MEDICATIONS: see list. Diabetes medication is Humalog 75/25 @ 75 in AM. 20 mid day and 75 in PM although with the recent weight loss he is not taking any insulin anymore.    DIETARY INTAKE:  Usual eating pattern includes 3 meals and 2 snacks per day.  Everyday foods include good variety of all food groups now.  Avoided foods include high fat and high sugar foods.    24-hr recall:  B (8 AM): 3 sausage links and 2 eggs,OR 1 cup Raisin Bran with  Milk,  water  Snk ( AM): SF popsicle OR PNB with crackers OR 1/2 banana  L (2 PM): sandwich and soup OR 2 sandwiches with lean meat OR salad with protein added, [place] or light [place] dressing, eater Snk ( PM): same as AM D (7 PM): lean meat x 1-2 servings, starch, vegetables, water Snk ( PM): only if BG is too low before bedtime, PNB to hold over night Beverages: water  Usual physical activity: walking outside rain or shine, on trail near his home currently 30 minutes each way  Estimated energy needs: 2400 calories 270 g carbohydrates 180 g protein 67 g fat  Intervention:  Nutrition counseling continued.   Commended him on his continued weight loss based on his behaviors of increased activity on a daily basis and reduced food intake in a balanced and healthy way. Discussed insulin action of his  75/25 and rationale for using a smaller dose at lunch to prevent stacking at other meals. 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  Plan:  Continue to aim for 5 Carb Choices per meal (75 grams) +/- 1 either way  Continue to aim for 0-2 Carbs per snack if hungry  Consider reading food labels for Total Carbohydrate of foods Continue with your activity level by walking for 30-60 minutes daily as tolerated Continue checking BG daily as directed by MD  Consider taking less insulin at lunch than at breakfast or supper due to the peak times of the Humalog 75/25 Your weight today was 561.9 pounds!    Handouts given during visit include: [employer] [facility] [employer] info handouts   Barriers to learning/adherance to lifestyle change: morbid obesity, visual impairment  Diabetes self-care support plan:   [facility] support group  Continued diabetes education  Monitoring/Evaluation:  Dietary intake, exercise, reading food labels, and body weight in 6 week(s).

## 2013-12-10 NOTE — Patient Instructions (Signed)
Plan:  Continue to aim for 5 Carb Choices per meal (75 grams) +/- 1 either way  Continue to aim for 0-2 Carbs per snack if hungry  Consider reading food labels for Total Carbohydrate of foods Continue with your activity level by walking for 30-60 minutes daily as tolerated Continue checking BG daily as directed by MD  Consider taking less insulin at lunch than at breakfast or supper due to the peak times of the Humalog 75/25 Your weight today was 561.9 pounds!

## 2014-02-04 ENCOUNTER — Ambulatory Visit: Payer: Medicare Other | Admitting: *Deleted

## 2014-03-25 ENCOUNTER — Emergency Department (HOSPITAL_COMMUNITY): Payer: Medicare Other

## 2014-03-25 ENCOUNTER — Encounter (HOSPITAL_COMMUNITY): Payer: Self-pay | Admitting: Emergency Medicine

## 2014-03-25 ENCOUNTER — Inpatient Hospital Stay (HOSPITAL_COMMUNITY)
Admission: EM | Admit: 2014-03-25 | Discharge: 2014-03-31 | DRG: 189 | Disposition: A | Discharge status: Home / Self Care | Payer: Medicare Other | Attending: Internal Medicine | Admitting: Internal Medicine

## 2014-03-25 DIAGNOSIS — R079 Chest pain, unspecified: Secondary | ICD-10-CM | POA: Diagnosis present

## 2014-03-25 DIAGNOSIS — R7309 Other abnormal glucose: Secondary | ICD-10-CM

## 2014-03-25 DIAGNOSIS — J4530 Mild persistent asthma, uncomplicated: Secondary | ICD-10-CM

## 2014-03-25 DIAGNOSIS — E119 Type 2 diabetes mellitus without complications: Secondary | ICD-10-CM

## 2014-03-25 DIAGNOSIS — J9611 Chronic respiratory failure with hypoxia: Secondary | ICD-10-CM

## 2014-03-25 DIAGNOSIS — Z6841 Body Mass Index (BMI) 40.0 and over, adult: Secondary | ICD-10-CM

## 2014-03-25 DIAGNOSIS — I2699 Other pulmonary embolism without acute cor pulmonale: Secondary | ICD-10-CM

## 2014-03-25 DIAGNOSIS — N179 Acute kidney failure, unspecified: Secondary | ICD-10-CM

## 2014-03-25 DIAGNOSIS — N178 Other acute kidney failure: Secondary | ICD-10-CM

## 2014-03-25 DIAGNOSIS — J9601 Acute respiratory failure with hypoxia: Secondary | ICD-10-CM

## 2014-03-25 DIAGNOSIS — I10 Essential (primary) hypertension: Secondary | ICD-10-CM | POA: Diagnosis present

## 2014-03-25 DIAGNOSIS — Z8249 Family history of ischemic heart disease and other diseases of the circulatory system: Secondary | ICD-10-CM

## 2014-03-25 DIAGNOSIS — I480 Paroxysmal atrial fibrillation: Secondary | ICD-10-CM

## 2014-03-25 DIAGNOSIS — I4892 Unspecified atrial flutter: Secondary | ICD-10-CM

## 2014-03-25 DIAGNOSIS — F329 Major depressive disorder, single episode, unspecified: Secondary | ICD-10-CM | POA: Diagnosis present

## 2014-03-25 DIAGNOSIS — J309 Allergic rhinitis, unspecified: Secondary | ICD-10-CM

## 2014-03-25 DIAGNOSIS — Z79899 Other long term (current) drug therapy: Secondary | ICD-10-CM

## 2014-03-25 DIAGNOSIS — I509 Heart failure, unspecified: Secondary | ICD-10-CM | POA: Diagnosis present

## 2014-03-25 DIAGNOSIS — J45901 Unspecified asthma with (acute) exacerbation: Secondary | ICD-10-CM

## 2014-03-25 DIAGNOSIS — J40 Bronchitis, not specified as acute or chronic: Secondary | ICD-10-CM

## 2014-03-25 DIAGNOSIS — G4733 Obstructive sleep apnea (adult) (pediatric): Secondary | ICD-10-CM

## 2014-03-25 DIAGNOSIS — Z7901 Long term (current) use of anticoagulants: Secondary | ICD-10-CM

## 2014-03-25 DIAGNOSIS — F488 Other specified nonpsychotic mental disorders: Secondary | ICD-10-CM

## 2014-03-25 DIAGNOSIS — R55 Syncope and collapse: Secondary | ICD-10-CM

## 2014-03-25 DIAGNOSIS — I5032 Chronic diastolic (congestive) heart failure: Secondary | ICD-10-CM | POA: Diagnosis present

## 2014-03-25 DIAGNOSIS — I5030 Unspecified diastolic (congestive) heart failure: Secondary | ICD-10-CM

## 2014-03-25 DIAGNOSIS — Z9089 Acquired absence of other organs: Secondary | ICD-10-CM

## 2014-03-25 DIAGNOSIS — J45909 Unspecified asthma, uncomplicated: Secondary | ICD-10-CM | POA: Diagnosis present

## 2014-03-25 DIAGNOSIS — H02409 Unspecified ptosis of unspecified eyelid: Secondary | ICD-10-CM

## 2014-03-25 DIAGNOSIS — R0602 Shortness of breath: Secondary | ICD-10-CM

## 2014-03-25 DIAGNOSIS — H5500 Unspecified nystagmus: Secondary | ICD-10-CM

## 2014-03-25 DIAGNOSIS — I4891 Unspecified atrial fibrillation: Secondary | ICD-10-CM | POA: Diagnosis present

## 2014-03-25 DIAGNOSIS — I482 Chronic atrial fibrillation, unspecified: Secondary | ICD-10-CM

## 2014-03-25 DIAGNOSIS — F3289 Other specified depressive episodes: Secondary | ICD-10-CM

## 2014-03-25 DIAGNOSIS — Z9119 Patient's noncompliance with other medical treatment and regimen: Secondary | ICD-10-CM

## 2014-03-25 DIAGNOSIS — G4730 Sleep apnea, unspecified: Secondary | ICD-10-CM | POA: Diagnosis present

## 2014-03-25 DIAGNOSIS — F411 Generalized anxiety disorder: Secondary | ICD-10-CM | POA: Diagnosis present

## 2014-03-25 DIAGNOSIS — L03119 Cellulitis of unspecified part of limb: Secondary | ICD-10-CM

## 2014-03-25 DIAGNOSIS — J4540 Moderate persistent asthma, uncomplicated: Secondary | ICD-10-CM

## 2014-03-25 DIAGNOSIS — J9620 Acute and chronic respiratory failure, unspecified whether with hypoxia or hypercapnia: Principal | ICD-10-CM | POA: Diagnosis present

## 2014-03-25 DIAGNOSIS — R0789 Other chest pain: Secondary | ICD-10-CM

## 2014-03-25 DIAGNOSIS — Z91199 Patient's noncompliance with other medical treatment and regimen due to unspecified reason: Secondary | ICD-10-CM

## 2014-03-25 DIAGNOSIS — G8929 Other chronic pain: Secondary | ICD-10-CM | POA: Diagnosis present

## 2014-03-25 DIAGNOSIS — E662 Morbid (severe) obesity with alveolar hypoventilation: Secondary | ICD-10-CM | POA: Diagnosis present

## 2014-03-25 LAB — CBC
HCT: 41.1 % (ref 39.0–52.0)
HEMOGLOBIN: 13.9 g/dL (ref 13.0–17.0)
MCH: 28 pg (ref 26.0–34.0)
MCHC: 33.8 g/dL (ref 30.0–36.0)
MCV: 82.7 fL (ref 78.0–100.0)
PLATELETS: 234 10*3/uL (ref 150–400)
RBC: 4.97 MIL/uL (ref 4.22–5.81)
RDW: 15 % (ref 11.5–15.5)
WBC: 10.6 10*3/uL — ABNORMAL HIGH (ref 4.0–10.5)

## 2014-03-25 LAB — CBG MONITORING, ED: Glucose-Capillary: 214 mg/dL — ABNORMAL HIGH (ref 70–99)

## 2014-03-25 LAB — BASIC METABOLIC PANEL
BUN: 11 mg/dL (ref 6–23)
CO2: 23 meq/L (ref 19–32)
Calcium: 8.7 mg/dL (ref 8.4–10.5)
Chloride: 102 mEq/L (ref 96–112)
Creatinine, Ser: 0.94 mg/dL (ref 0.50–1.35)
GFR calc Af Amer: >90 mL/min (ref >90)
GLUCOSE: 212 mg/dL — AB (ref 70–99)
POTASSIUM: 3.6 meq/L — AB (ref 3.7–5.3)
SODIUM: 139 meq/L (ref 137–147)

## 2014-03-25 LAB — I-STAT TROPONIN, ED: TROPONIN I, POC: 0 ng/mL (ref 0.00–0.08)

## 2014-03-25 LAB — PRO B NATRIURETIC PEPTIDE: PRO B NATRI PEPTIDE: 33.8 pg/mL (ref 0–125)

## 2014-03-25 MED ORDER — ASPIRIN 81 MG PO CHEW
324.0000 mg | CHEWABLE_TABLET | Freq: Once | ORAL | Status: AC
  Administered 2014-03-25: 324 mg via ORAL
  Filled 2014-03-25: qty 4

## 2014-03-25 MED ORDER — ASPIRIN EC 325 MG PO TBEC
325.0000 mg | DELAYED_RELEASE_TABLET | Freq: Once | ORAL | Status: DC
  Filled 2014-03-25: qty 1

## 2014-03-25 MED ORDER — NITROGLYCERIN 0.4 MG SL SUBL
0.4000 mg | SUBLINGUAL_TABLET | SUBLINGUAL | Status: DC | PRN
Start: 2014-03-25 — End: 2014-03-31
  Administered 2014-03-25 – 2014-03-26 (×6): 0.4 mg via SUBLINGUAL
  Filled 2014-03-25: qty 1

## 2014-03-25 NOTE — ED Provider Notes (Signed)
CSN: [id number]     Arrival date & time 03/25/14  2045 History   First MD Initiated Contact with Patient 03/25/14 2051     Chief Complaint  Patient presents with  . Chest Pain   Patient is a 43 y.o. male presenting with chest pain. The history is provided by the patient. No language interpreter was used.  Chest Pain Pain location:  R chest Pain quality: sharp and shooting   Pain radiates to:  R arm and R shoulder Pain radiates to the back: yes   Pain severity:  Severe Onset quality:  Gradual Duration:  1 week Timing:  Intermittent Progression:  Worsening Chronicity:  New Context: breathing, lifting, movement, raising an arm and at rest   Context: no drug use, not eating, no intercourse, no stress and no trauma   Relieved by:  Nothing Worsened by:  Coughing and deep breathing Ineffective treatments:  None tried Associated symptoms: back pain, cough, fatigue and shortness of breath   Associated symptoms: no abdominal pain, no altered mental status, no anorexia, no anxiety, no claudication, no diaphoresis, no dizziness, no dysphagia, no fever, no headache, no heartburn, no lower extremity edema, no nausea, no near-syncope, no numbness, no orthopnea, no palpitations, no PND, no syncope, not vomiting and no weakness   Risk factors: diabetes mellitus, hypertension, male sex and obesity   Risk factors: no immobilization, no prior DVT/PE, no smoking and no surgery     Past Medical History  Diagnosis Date  . CHF (congestive heart failure)   . HTN (hypertension)   . Ptosis   . Atrial flutter   . Atrial fibrillation   . Morbid obesity   . DM2 (diabetes mellitus, type 2)   . Depression   . Respiratory failure   . Chronic low back pain   . Diabetes mellitus   . Asthma   . Vision abnormalities   . Abscess     right thigh   Past Surgical History  Procedure Laterality Date  . Tonsillectomy    . Cholecystectomy    . Tonsillectomy    . Irrigation and debridement abscess  10/13/2011     Procedure: IRRIGATION AND DEBRIDEMENT ABSCESS;  Surgeon: Emelia LoronMatthew Wakefield, MD;  Location: [facility] OR;  Service: General;  Laterality: Right;  Irrigation and debridement right thigh abscess  . Application of wound vac      right thigh abscess   Family History  Problem Relation Age of Onset  . Coronary artery disease Mother     6640s; father had it in 6330s; both deceased in their 6970s    . Cancer Mother     liver  . Heart disease Father   . Cancer Father     prostate   History  Substance Use Topics  . Smoking status: Never Smoker   . Smokeless tobacco: Not on file     Comment: no significant tobacco use   . Alcohol Use: Yes     Comment: no significant use-- rare    Review of Systems  Constitutional: Positive for fatigue. Negative for fever, chills and diaphoresis.  HENT: Negative for trouble swallowing.   Respiratory: Positive for cough and shortness of breath.   Cardiovascular: Positive for chest pain. Negative for palpitations, orthopnea, claudication, leg swelling, syncope, PND and near-syncope.  Gastrointestinal: Negative for heartburn, nausea, vomiting, abdominal pain, diarrhea, constipation, blood in stool and anorexia.  Genitourinary: Negative for dysuria, urgency, hematuria, flank pain and difficulty urinating.  Musculoskeletal: Positive for back pain and myalgias.  Neurological: Negative for dizziness, weakness, numbness and headaches.  All other systems reviewed and are negative.     Allergies  Iodine and Shellfish allergy  Home Medications   Prior to Admission medications   Medication Sig Start Date End Date Taking? Authorizing Provider  albuterol (VENTOLIN HFA) 108 (90 BASE) MCG/ACT inhaler Inhale 2 puffs into the lungs every 6 (six) hours as needed. Shortness of breath   Yes Historical Provider, MD  amiodarone (PACERONE) 200 MG tablet Take 200 mg by mouth daily.   Yes Historical Provider, MD  amLODipine (NORVASC) 10 MG tablet Take 10 mg by mouth daily.  11/13/11   Yes Historical Provider, MD  buPROPion (WELLBUTRIN XL) 300 MG 24 hr tablet Take 300 mg by mouth Daily.  03/13/12  Yes Historical Provider, MD  cyclobenzaprine (FLEXERIL) 10 MG tablet Take 10 mg by mouth 3 (three) times daily as needed for muscle spasms.   Yes Historical Provider, MD  divalproex (DEPAKOTE) 500 MG DR tablet Take 1,000 mg by mouth at bedtime.    Yes Historical Provider, MD  HUMALOG MIX 75/25 (75-25) 100 UNIT/ML SUSP Inject 40-75 Units into the skin Three times a day before meals. Takes 75 units before breakfast Takes 40 units before lunch Takes 65 units before supper 03/13/12  Yes Historical Provider, MD  lamoTRIgine (LAMICTAL) 100 MG tablet Take 100 mg by mouth daily.  10/23/11  Yes Historical Provider, MD  metoprolol (TOPROL-XL) 50 MG 24 hr tablet Take 50 mg by mouth daily. 05/10/11  Yes Lewayne Bunting, MD  nitroGLYCERIN (NITROSTAT) 0.4 MG SL tablet Place 0.4 mg under the tongue once.   Yes Historical Provider, MD  traMADol (ULTRAM) 50 MG tablet Take 50-100 mg by mouth every 6 (six) hours as needed for pain.   Yes Historical Provider, MD   BP 142/67  Pulse 86  Temp(Src) 98.7 F (37.1 C) (Oral)  Resp 18  Ht 6' 3.2" (1.91 m)  Wt 585 lb (265.354 kg)  BMI 72.74 kg/m2  SpO2 95% Physical Exam  Nursing note and vitals reviewed. Constitutional: He is oriented to person, place, and time. He appears well-developed. No distress.  Morbidly obese  HENT:  Head: Normocephalic and atraumatic.  Mouth/Throat: Oropharynx is clear and moist. No oropharyngeal exudate.  Eyes: Conjunctivae and EOM are normal. Pupils are equal, round, and reactive to light. No scleral icterus.  Neck: Normal range of motion. Neck supple. No JVD present. No thyromegaly present.  Cardiovascular: Normal rate, regular rhythm, normal heart sounds and intact distal pulses.  Exam reveals no gallop and no friction rub.   No murmur heard. Pulmonary/Chest: Breath sounds normal. No respiratory distress. He has no wheezes. He  has no rales. He exhibits tenderness.  Abdominal: Soft. Bowel sounds are normal. He exhibits no distension and no mass. There is no tenderness. There is no rebound and no guarding.  Musculoskeletal: Normal range of motion.  Lymphadenopathy:    He has no cervical adenopathy.  Neurological: He is alert and oriented to person, place, and time. No cranial nerve deficit. Coordination normal.  Skin: Skin is warm and dry. He is not diaphoretic.  Psychiatric: He has a normal mood and affect. His behavior is normal. Judgment and thought content normal.    ED Course  Procedures (including critical care time) Labs Review Labs Reviewed  CBC - Abnormal; Notable for the following:    WBC 10.6 (*)    All other components within normal limits  BASIC METABOLIC PANEL - Abnormal; Notable for the  following:    Potassium 3.6 (*)    Glucose, Bld 212 (*)    All other components within normal limits  D-DIMER, QUANTITATIVE - Abnormal; Notable for the following:    D-Dimer, Quant 0.73 (*)    All other components within normal limits  HEMOGLOBIN A1C - Abnormal; Notable for the following:    Hemoglobin A1C 6.8 (*)    Mean Plasma Glucose 148 (*)    All other components within normal limits  LIPID PANEL - Abnormal; Notable for the following:    Triglycerides 167 (*)    HDL 34 (*)    All other components within normal limits  COMPREHENSIVE METABOLIC PANEL - Abnormal; Notable for the following:    Glucose, Bld 178 (*)    Albumin 3.3 (*)    GFR calc non Af Amer 89 (*)    All other components within normal limits  GLUCOSE, CAPILLARY - Abnormal; Notable for the following:    Glucose-Capillary 136 (*)    All other components within normal limits  GLUCOSE, CAPILLARY - Abnormal; Notable for the following:    Glucose-Capillary 148 (*)    All other components within normal limits  GLUCOSE, CAPILLARY - Abnormal; Notable for the following:    Glucose-Capillary 158 (*)    All other components within normal limits   GLUCOSE, CAPILLARY - Abnormal; Notable for the following:    Glucose-Capillary 133 (*)    All other components within normal limits  GLUCOSE, CAPILLARY - Abnormal; Notable for the following:    Glucose-Capillary 158 (*)    All other components within normal limits  GLUCOSE, CAPILLARY - Abnormal; Notable for the following:    Glucose-Capillary 150 (*)    All other components within normal limits  GLUCOSE, CAPILLARY - Abnormal; Notable for the following:    Glucose-Capillary 149 (*)    All other components within normal limits  GLUCOSE, CAPILLARY - Abnormal; Notable for the following:    Glucose-Capillary 150 (*)    All other components within normal limits  GLUCOSE, CAPILLARY - Abnormal; Notable for the following:    Glucose-Capillary 129 (*)    All other components within normal limits  CBG MONITORING, ED - Abnormal; Notable for the following:    Glucose-Capillary 214 (*)    All other components within normal limits  PRO B NATRIURETIC PEPTIDE  TROPONIN I  TROPONIN I  TROPONIN I  MAGNESIUM  PHOSPHORUS  TSH  CBC  CREATININE, SERUM  PROTIME-INR  PROTIME-INR  CBC  I-STAT TROPOININ, ED  Rosezena SensorI-STAT TROPOININ, ED    Imaging Review Dg Chest Port 1 View  03/25/2014   CLINICAL DATA:  Chest pain.  EXAM: PORTABLE CHEST - 1 VIEW  COMPARISON:  05/09/2013  FINDINGS: The heart is enlarged but stable. The lungs are clear. No pleural effusion. No pneumothorax. The bony thorax is grossly normal.  IMPRESSION: No acute cardiopulmonary findings.   Electronically Signed   By: Loralie ChampagneMark  Gallerani M.D.   On: 03/25/2014 21:15     EKG Interpretation   Date/Time:  Wednesday March 25 2014 20:55:08 EDT Ventricular Rate:  94 PR Interval:  196 QRS Duration: 89 QT Interval:  371 QTC Calculation: 464 R Axis:   -24 Text Interpretation:  Sinus rhythm Borderline left axis deviation Low  voltage, precordial leads Probable anterolateral infarct, old No  significant change since last tracing Confirmed by  GOLDSTON  MD, SCOTT  ([id number]) on 03/25/2014 11:18:08 PM      MDM   Final diagnoses:  Chest pain, unspecified  chest pain type   Patient presents to the [facility] ED with chest pain x 1 week.  Basic cardiac workup has been performed here in the ED.  EKG, Troponin, BNP, CXR, and BMP are negative here in the ED.  I have treated the patient with aspirin and two nitroglycerine tablets here in the ED with some relief.  Patient is a non-smoker, but does have a history of HTN, diabetes, and morbid obesity.  D-dimer is positive.  Patient has an anaphylactic allergy to IV contrast dye.  Patient cannot receive CT angiogram of the chest.  We will have to admit the patient tonight for continued observation and perform a VQ scan in the AM.  I spoke with Dr. Toniann Fail who will admit the patient tonight for observation to telemetry and perform the VQ scan in the morning.      Clydie Braun, PA-C 03/27/14 1635

## 2014-03-25 NOTE — ED Notes (Signed)
Pt CBG 214 

## 2014-03-25 NOTE — ED Notes (Signed)
Pt presents to ED with c/o right-sided chest pain, onset 1 week ago.  Pt also c/o some shortness of breath; pt denies headache, dizziness or nausea.

## 2014-03-25 NOTE — ED Notes (Signed)
EKG given to EDP,Goldston,MD. For review. 

## 2014-03-26 ENCOUNTER — Other Ambulatory Visit (HOSPITAL_COMMUNITY): Payer: Medicare Other

## 2014-03-26 ENCOUNTER — Encounter (HOSPITAL_COMMUNITY): Payer: Self-pay | Admitting: Internal Medicine

## 2014-03-26 DIAGNOSIS — J45901 Unspecified asthma with (acute) exacerbation: Secondary | ICD-10-CM

## 2014-03-26 DIAGNOSIS — R55 Syncope and collapse: Secondary | ICD-10-CM

## 2014-03-26 DIAGNOSIS — F3289 Other specified depressive episodes: Secondary | ICD-10-CM

## 2014-03-26 DIAGNOSIS — R079 Chest pain, unspecified: Secondary | ICD-10-CM

## 2014-03-26 DIAGNOSIS — I4891 Unspecified atrial fibrillation: Secondary | ICD-10-CM

## 2014-03-26 DIAGNOSIS — Z6841 Body Mass Index (BMI) 40.0 and over, adult: Secondary | ICD-10-CM

## 2014-03-26 DIAGNOSIS — I517 Cardiomegaly: Secondary | ICD-10-CM

## 2014-03-26 DIAGNOSIS — I509 Heart failure, unspecified: Secondary | ICD-10-CM

## 2014-03-26 DIAGNOSIS — I5030 Unspecified diastolic (congestive) heart failure: Secondary | ICD-10-CM

## 2014-03-26 DIAGNOSIS — E119 Type 2 diabetes mellitus without complications: Secondary | ICD-10-CM

## 2014-03-26 DIAGNOSIS — F329 Major depressive disorder, single episode, unspecified: Secondary | ICD-10-CM

## 2014-03-26 LAB — CBC
HCT: 40.1 % (ref 39.0–52.0)
HEMOGLOBIN: 13.3 g/dL (ref 13.0–17.0)
MCH: 28 pg (ref 26.0–34.0)
MCHC: 33.2 g/dL (ref 30.0–36.0)
MCV: 84.4 fL (ref 78.0–100.0)
PLATELETS: 207 10*3/uL (ref 150–400)
RBC: 4.75 MIL/uL (ref 4.22–5.81)
RDW: 15.1 % (ref 11.5–15.5)
WBC: 9.8 10*3/uL (ref 4.0–10.5)

## 2014-03-26 LAB — COMPREHENSIVE METABOLIC PANEL
ALT: 6 U/L (ref 0–53)
AST: 12 U/L (ref 0–37)
Albumin: 3.3 g/dL — ABNORMAL LOW (ref 3.5–5.2)
Alkaline Phosphatase: 58 U/L (ref 39–117)
BUN: 11 mg/dL (ref 6–23)
CALCIUM: 8.6 mg/dL (ref 8.4–10.5)
CO2: 24 meq/L (ref 19–32)
CREATININE: 1.01 mg/dL (ref 0.50–1.35)
Chloride: 104 mEq/L (ref 96–112)
GFR, EST NON AFRICAN AMERICAN: 89 mL/min — AB (ref >90)
GLUCOSE: 178 mg/dL — AB (ref 70–99)
Potassium: 3.8 mEq/L (ref 3.7–5.3)
Sodium: 141 mEq/L (ref 137–147)
Total Bilirubin: 0.4 mg/dL (ref 0.3–1.2)
Total Protein: 6.9 g/dL (ref 6.0–8.3)

## 2014-03-26 LAB — TSH: TSH: 2.06 u[IU]/mL (ref 0.350–4.500)

## 2014-03-26 LAB — LIPID PANEL
CHOL/HDL RATIO: 4.4 ratio
CHOLESTEROL: 149 mg/dL (ref 0–200)
HDL: 34 mg/dL — ABNORMAL LOW (ref >39)
LDL Cholesterol: 82 mg/dL (ref 0–99)
Triglycerides: 167 mg/dL — ABNORMAL HIGH (ref <150)
VLDL: 33 mg/dL (ref 0–40)

## 2014-03-26 LAB — GLUCOSE, CAPILLARY
GLUCOSE-CAPILLARY: 148 mg/dL — AB (ref 70–99)
GLUCOSE-CAPILLARY: 158 mg/dL — AB (ref 70–99)
Glucose-Capillary: 136 mg/dL — ABNORMAL HIGH (ref 70–99)
Glucose-Capillary: 158 mg/dL — ABNORMAL HIGH (ref 70–99)
Glucose-Capillary: 133 mg/dL — ABNORMAL HIGH (ref 70–99)

## 2014-03-26 LAB — TROPONIN I
Troponin I: <0.3 ng/mL (ref <0.30)
Troponin I: <0.3 ng/mL (ref <0.30)

## 2014-03-26 LAB — HEMOGLOBIN A1C
HEMOGLOBIN A1C: 6.8 % — AB (ref <5.7)
Mean Plasma Glucose: 148 mg/dL — ABNORMAL HIGH (ref <117)

## 2014-03-26 LAB — CREATININE, SERUM
CREATININE: 1 mg/dL (ref 0.50–1.35)
GFR calc Af Amer: >90 mL/min (ref >90)

## 2014-03-26 LAB — I-STAT TROPONIN, ED: TROPONIN I, POC: 0 ng/mL (ref 0.00–0.08)

## 2014-03-26 LAB — PROTIME-INR
INR: 1.13 (ref 0.00–1.49)
Prothrombin Time: 14.5 seconds (ref 11.6–15.2)

## 2014-03-26 LAB — PHOSPHORUS: Phosphorus: 3.4 mg/dL (ref 2.3–4.6)

## 2014-03-26 LAB — MAGNESIUM: Magnesium: 1.8 mg/dL (ref 1.5–2.5)

## 2014-03-26 LAB — D-DIMER, QUANTITATIVE: D-Dimer, Quant: 0.73 ug/mL-FEU — ABNORMAL HIGH (ref 0.00–0.48)

## 2014-03-26 MED ORDER — WARFARIN SODIUM 7.5 MG PO TABS
15.0000 mg | ORAL_TABLET | Freq: Once | ORAL | Status: AC
  Administered 2014-03-26: 15 mg via ORAL
  Filled 2014-03-26: qty 2

## 2014-03-26 MED ORDER — AMIODARONE HCL 200 MG PO TABS
200.0000 mg | ORAL_TABLET | Freq: Every day | ORAL | Status: DC
  Administered 2014-03-26 – 2014-03-31 (×6): 200 mg via ORAL
  Filled 2014-03-26 (×6): qty 1

## 2014-03-26 MED ORDER — SODIUM CHLORIDE 0.9 % IJ SOLN
3.0000 mL | Freq: Two times a day (BID) | INTRAMUSCULAR | Status: DC
  Administered 2014-03-26 – 2014-03-30 (×10): 3 mL via INTRAVENOUS

## 2014-03-26 MED ORDER — WARFARIN - PHARMACIST DOSING INPATIENT: Freq: Every day | Status: DC

## 2014-03-26 MED ORDER — DOCUSATE SODIUM 100 MG PO CAPS
100.0000 mg | ORAL_CAPSULE | Freq: Two times a day (BID) | ORAL | Status: DC
  Administered 2014-03-26 – 2014-03-31 (×11): 100 mg via ORAL
  Filled 2014-03-26 (×12): qty 1

## 2014-03-26 MED ORDER — METOPROLOL SUCCINATE ER 50 MG PO TB24
50.0000 mg | ORAL_TABLET | Freq: Every day | ORAL | Status: DC
  Administered 2014-03-26 – 2014-03-31 (×6): 50 mg via ORAL
  Filled 2014-03-26 (×6): qty 1

## 2014-03-26 MED ORDER — ASPIRIN EC 81 MG PO TBEC
81.0000 mg | DELAYED_RELEASE_TABLET | Freq: Every day | ORAL | Status: DC
  Administered 2014-03-26 – 2014-03-27 (×2): 81 mg via ORAL
  Filled 2014-03-26 (×2): qty 1

## 2014-03-26 MED ORDER — SODIUM CHLORIDE 0.9 % IJ SOLN: 3.0000 mL | INTRAMUSCULAR | Status: DC | PRN

## 2014-03-26 MED ORDER — LEVALBUTEROL HCL 1.25 MG/0.5ML IN NEBU
1.2500 mg | INHALATION_SOLUTION | Freq: Four times a day (QID) | RESPIRATORY_TRACT | Status: DC
  Administered 2014-03-26 (×3): 1.25 mg via RESPIRATORY_TRACT
  Filled 2014-03-26 (×4): qty 0.5

## 2014-03-26 MED ORDER — IBUPROFEN 600 MG PO TABS
600.0000 mg | ORAL_TABLET | Freq: Four times a day (QID) | ORAL | Status: DC | PRN
  Filled 2014-03-26: qty 1

## 2014-03-26 MED ORDER — SODIUM CHLORIDE 0.9 % IJ SOLN
3.0000 mL | Freq: Two times a day (BID) | INTRAMUSCULAR | Status: DC
  Administered 2014-03-26: 3 mL via INTRAVENOUS

## 2014-03-26 MED ORDER — ENOXAPARIN SODIUM 120 MG/0.8ML ~~LOC~~ SOLN
110.0000 mg | Freq: Two times a day (BID) | SUBCUTANEOUS | Status: DC
  Administered 2014-03-26 – 2014-03-27 (×2): 110 mg via SUBCUTANEOUS
  Filled 2014-03-26 (×5): qty 0.8

## 2014-03-26 MED ORDER — ENOXAPARIN SODIUM 120 MG/0.8ML ~~LOC~~ SOLN
110.0000 mg | SUBCUTANEOUS | Status: AC
  Administered 2014-03-26: 110 mg via SUBCUTANEOUS
  Filled 2014-03-26: qty 0.8

## 2014-03-26 MED ORDER — ONDANSETRON HCL 4 MG/2ML IJ SOLN: 4.0000 mg | Freq: Four times a day (QID) | INTRAMUSCULAR | Status: DC | PRN

## 2014-03-26 MED ORDER — ENOXAPARIN SODIUM 150 MG/ML ~~LOC~~ SOLN
150.0000 mg | SUBCUTANEOUS | Status: AC
  Administered 2014-03-26: 150 mg via SUBCUTANEOUS
  Filled 2014-03-26: qty 1

## 2014-03-26 MED ORDER — LAMOTRIGINE 100 MG PO TABS
100.0000 mg | ORAL_TABLET | Freq: Every day | ORAL | Status: DC
  Administered 2014-03-26 – 2014-03-31 (×6): 100 mg via ORAL
  Filled 2014-03-26 (×6): qty 1

## 2014-03-26 MED ORDER — ONDANSETRON HCL 4 MG PO TABS: 4.0000 mg | ORAL_TABLET | Freq: Four times a day (QID) | ORAL | Status: DC | PRN

## 2014-03-26 MED ORDER — ACETAMINOPHEN 325 MG PO TABS
650.0000 mg | ORAL_TABLET | Freq: Four times a day (QID) | ORAL | Status: DC | PRN
  Administered 2014-03-26: 650 mg via ORAL
  Filled 2014-03-26: qty 2

## 2014-03-26 MED ORDER — ENOXAPARIN SODIUM 40 MG/0.4ML ~~LOC~~ SOLN: 40.0000 mg | SUBCUTANEOUS | Status: DC

## 2014-03-26 MED ORDER — DIVALPROEX SODIUM 500 MG PO DR TAB
1000.0000 mg | DELAYED_RELEASE_TABLET | Freq: Every day | ORAL | Status: DC
  Administered 2014-03-26 – 2014-03-30 (×5): 1000 mg via ORAL
  Filled 2014-03-26 (×6): qty 2

## 2014-03-26 MED ORDER — LEVALBUTEROL HCL 1.25 MG/0.5ML IN NEBU
1.2500 mg | INHALATION_SOLUTION | RESPIRATORY_TRACT | Status: DC | PRN
  Filled 2014-03-26: qty 0.5

## 2014-03-26 MED ORDER — BUPROPION HCL ER (XL) 300 MG PO TB24
300.0000 mg | ORAL_TABLET | Freq: Every day | ORAL | Status: DC
  Administered 2014-03-26 – 2014-03-31 (×6): 300 mg via ORAL
  Filled 2014-03-26 (×6): qty 1

## 2014-03-26 MED ORDER — ALBUTEROL SULFATE HFA 108 (90 BASE) MCG/ACT IN AERS: 2.0000 | INHALATION_SPRAY | Freq: Four times a day (QID) | RESPIRATORY_TRACT | Status: DC | PRN

## 2014-03-26 MED ORDER — INSULIN GLARGINE 100 UNIT/ML ~~LOC~~ SOLN
70.0000 [IU] | Freq: Every day | SUBCUTANEOUS | Status: DC
  Administered 2014-03-26 – 2014-03-28 (×3): 70 [IU] via SUBCUTANEOUS
  Filled 2014-03-26 (×4): qty 0.7

## 2014-03-26 MED ORDER — COUMADIN BOOK
Freq: Once | Status: AC
  Administered 2014-03-26: 18:00:00
  Filled 2014-03-26: qty 1

## 2014-03-26 MED ORDER — WARFARIN VIDEO
Freq: Once | Status: AC
  Administered 2014-03-26: 18:00:00

## 2014-03-26 MED ORDER — ENOXAPARIN SODIUM 150 MG/ML ~~LOC~~ SOLN
150.0000 mg | Freq: Two times a day (BID) | SUBCUTANEOUS | Status: DC
  Administered 2014-03-26 – 2014-03-27 (×2): 150 mg via SUBCUTANEOUS
  Filled 2014-03-26 (×4): qty 1

## 2014-03-26 MED ORDER — KETOROLAC TROMETHAMINE 30 MG/ML IJ SOLN
30.0000 mg | Freq: Once | INTRAMUSCULAR | Status: AC
  Filled 2014-03-26: qty 1
  Filled 2014-03-26: qty 2

## 2014-03-26 MED ORDER — AMLODIPINE BESYLATE 10 MG PO TABS
10.0000 mg | ORAL_TABLET | Freq: Every day | ORAL | Status: DC
  Administered 2014-03-26 – 2014-03-31 (×6): 10 mg via ORAL
  Filled 2014-03-26 (×6): qty 1

## 2014-03-26 MED ORDER — SODIUM CHLORIDE 0.9 % IV SOLN: 250.0000 mL | INTRAVENOUS | Status: DC | PRN

## 2014-03-26 MED ORDER — ACETAMINOPHEN 650 MG RE SUPP: 650.0000 mg | Freq: Four times a day (QID) | RECTAL | Status: DC | PRN

## 2014-03-26 MED ORDER — INSULIN ASPART 100 UNIT/ML ~~LOC~~ SOLN
0.0000 [IU] | SUBCUTANEOUS | Status: DC
  Administered 2014-03-26 (×2): 1 [IU] via SUBCUTANEOUS
  Administered 2014-03-26: 2 [IU] via SUBCUTANEOUS
  Administered 2014-03-26: 1 [IU] via SUBCUTANEOUS
  Administered 2014-03-26: 2 [IU] via SUBCUTANEOUS
  Administered 2014-03-27 (×3): 1 [IU] via SUBCUTANEOUS
  Administered 2014-03-27: 05:00:00 via SUBCUTANEOUS

## 2014-03-26 MED ORDER — ALBUTEROL SULFATE (2.5 MG/3ML) 0.083% IN NEBU
2.5000 mg | INHALATION_SOLUTION | Freq: Four times a day (QID) | RESPIRATORY_TRACT | Status: DC | PRN
  Administered 2014-03-26: 2.5 mg via RESPIRATORY_TRACT
  Filled 2014-03-26: qty 3

## 2014-03-26 MED ORDER — KETOROLAC TROMETHAMINE 15 MG/ML IJ SOLN
INTRAMUSCULAR | Status: AC
  Administered 2014-03-26: 30 mg
  Filled 2014-03-26: qty 1

## 2014-03-26 MED ORDER — HYDROCODONE-ACETAMINOPHEN 5-325 MG PO TABS
1.0000 | ORAL_TABLET | ORAL | Status: DC | PRN
  Administered 2014-03-26 – 2014-03-31 (×20): 2 via ORAL
  Filled 2014-03-26 (×20): qty 2

## 2014-03-26 MED ORDER — CYCLOBENZAPRINE HCL 10 MG PO TABS
10.0000 mg | ORAL_TABLET | Freq: Three times a day (TID) | ORAL | Status: DC | PRN
  Administered 2014-03-26 – 2014-03-30 (×11): 10 mg via ORAL
  Filled 2014-03-26 (×10): qty 1

## 2014-03-26 MED ORDER — TRAMADOL HCL 50 MG PO TABS
50.0000 mg | ORAL_TABLET | Freq: Four times a day (QID) | ORAL | Status: DC | PRN
  Administered 2014-03-26 – 2014-03-30 (×6): 100 mg via ORAL
  Filled 2014-03-26 (×6): qty 2

## 2014-03-26 NOTE — ED Notes (Signed)
Floor Unit RN was called and informed that pt needs a bariatric bed.

## 2014-03-26 NOTE — Progress Notes (Signed)
Patient ID: [name], male   DOB: 04/10/1971, 43 y.o.   MRN: [id number] [facility] PROGRESS NOTE  [name] YQM:[id number] DOB: 02/22/1971 DOA: 03/25/2014 PCP: Emeterio ReeveWOLTERS,SHARON A, MD  Brief narrative: Addendum to admission note done 03/26/2014  43 y.o. male with multiple medical comorbidities including but not limited to morbid obesity, OSA with hypoventilation on CPAP, atrial fibrillation on coumadin who [facility] with worsening shortness of breath, chest tightness and coughing for past few days prior to this admission. CXR showed no acute cardiopulmonary process.  Assessment/Plan:  Principal Problem: Acute respiratory failure with hypoxia / OSA and OHS / Asthma exacerbation  Respiratory status stable at this time and oxygen saturation in 95% range with 4 L [place] oxygen support via [place]  Pt non compliant with CPAP at home.  He had slightly elevated D dimer on admission but he is on coumadin although non-compliant with it. It may be reasonable to obtain NM perfusion scan to evaluate for possible PE. He is able to get up and ambulate with walker but admits to significant inactivity due to morbid obesity changed BD to xopenex every 2 hours PRN and every 6 hours scheduled. Active Problems:   DIABETES MELLITUS, TYPE II  Check A1c  Continue insulin 70 units at bedtime and SSI   Atrial fibrillation  On anticoagulation with coumadin; Lovenox used for bridging with coumadin  Als  on amiodarone for rate control   OHS/OSA  CPAP at bedtime    CHEST PAIN / Near syncope  Cardiac enzymes x 2 WNL  Chest pain resolved  Continue aspirin daily   No acute findings on carotid doppler    Morbid obesity with body mass index of 60.0-69.9 in adult  Educated on diet    HTN (hypertension), benign  Continue Norvasc 10 mg daily, metoprolol 50 mg daily    Depression  Continue Wellbutrin   DVT prophylaxis: on therapeutic anticoagulation with coumadin and Lovenox sub  Q  Code Status: full code  Family Communication: plan of care discussed with the patient Disposition Plan: home when stable   Manson PasseyEVINE, ALMA, MD  [facility] Pager [phone]  If 7PM-7AM, please contact night-coverage [email or web address] Password [email or web address] 03/26/2014, 2:18 PM   LOS: 1 day   Consultants:  None   Procedures:  Carotid doppler  Antibiotics:  None   HPI/Subjective: No acute overnight events.  Objective: Filed Vitals:   03/26/14 0510 03/26/14 0812 03/26/14 0943 03/26/14 1019  BP: 149/76   163/81  Pulse:    73  Temp:    97.6 F (36.4 C)  TempSrc:    Oral  Resp:    20  Height:      Weight:  265.354 kg (585 lb)    SpO2:   94% 95%    Intake/Output Summary (Last 24 hours) at 03/26/14 1418 Last data filed at 03/26/14 1300  Gross per 24 hour  Intake    480 ml  Output      0 ml  Net    480 ml    Exam:   General:  Pt is alert, no distress, morbidly obese   Cardiovascular: Regular rate and rhythm, S1/S2, no murmurs  Respiratory: wheezing in upper lung lobes, no crackles   Abdomen: Soft, non tender, non distended, bowel sounds present  Extremities: LE obese, trace edema, pulses DP and PT palpable bilaterally  Neuro: Grossly nonfocal  Data Reviewed: Basic Metabolic Panel:  Recent Labs Lab 03/25/14 2110 03/26/14 0435  NA 139 141  K 3.6* 3.8  CL 102 104  CO2 23 24  GLUCOSE 212* 178*  BUN 11 11  CREATININE 0.94 1.01  1.00  CALCIUM 8.7 8.6  MG  --  1.8  PHOS  --  3.4   Liver Function Tests:  Recent Labs Lab 03/26/14 0435  AST 12  ALT 6  ALKPHOS 58  BILITOT 0.4  PROT 6.9  ALBUMIN 3.3*   No results found for this basename: LIPASE, AMYLASE,  in the last 168 hours No results found for this basename: AMMONIA,  in the last 168 hours CBC:  Recent Labs Lab 03/25/14 2110 03/26/14 0435  WBC 10.6* 9.8  HGB 13.9 13.3  HCT 41.1 40.1  MCV 82.7 84.4  PLT 234 207   Cardiac Enzymes:  Recent Labs Lab 03/26/14 0435 03/26/14 1010   TROPONINI <0.30 <0.30   BNP: No components found with this basename: POCBNP,  CBG:  Recent Labs Lab 03/25/14 2054 03/26/14 0315 03/26/14 0747 03/26/14 1136  GLUCAP 214* 136* 148* 158*    No results found for this or any previous visit (from the past 240 hour(s)).   Studies: Dg Chest Port 1 View 03/25/2014   IMPRESSION: No acute cardiopulmonary findings.      Scheduled Meds: . amiodarone  200 mg Oral Daily  . amLODipine  10 mg Oral Daily  . aspirin EC  81 mg Oral Daily  . buPROPion  300 mg Oral Daily  . divalproex  1,000 mg Oral QHS  . docusate sodium  100 mg Oral BID  . enoxaparin (LOVENOX) injection  110 mg Subcutaneous Q12H   And  . enoxaparin (LOVENOX) injection  150 mg Subcutaneous Q12H  . insulin aspart  0-9 Units Subcutaneous 6 times per day  . insulin glargine  70 Units Subcutaneous QHS  . lamoTRIgine  100 mg Oral Daily  . levalbuterol  1.25 mg Nebulization 4 times per day  . metoprolol succinate  50 mg Oral Daily  . sodium chloride  3 mL Intravenous Q12H  . warfarin  15 mg Oral ONCE-1800   Continuous Infusions:

## 2014-03-26 NOTE — Progress Notes (Signed)
ANTICOAGULATION CONSULT NOTE - Follow Up  Pharmacy Consult for warfarin, enoxaparin Indication: atrial fibrillation and pulmonary embolus  Allergies  Allergen Reactions  . Iodine Anaphylaxis and Other (See Comments)    Associated with the shellfish allergy  . Shellfish Allergy Anaphylaxis    Patient Measurements: Height: 6' 3.2" (191 cm) Weight: 585 lb (265.354 kg) IBW/kg (Calculated) : 84.95  Vital Signs: Temp: 97.6 F (36.4 C) (06/25 1019) Temp src: Oral (06/25 1019) BP: 163/81 mmHg (06/25 1019) Pulse Rate: 73 (06/25 1019)  Labs:  Recent Labs  03/25/14 2110 03/26/14 0435 03/26/14 1010  HGB 13.9 13.3  --   HCT 41.1 40.1  --   PLT 234 207  --   LABPROT  --   --  14.5  INR  --   --  1.13  CREATININE 0.94 1.01  1.00  --   TROPONINI  --  <0.30 <0.30    Estimated Creatinine Clearance: 209.7 ml/min (by C-G formula based on Cr of 1.01).   Assessment: 43 yo obese male with history of afib presents with chest pain, recent syncope, and elevated d-dimer.  Patient is supposed to be on warfarin for history of afib but per Dr. Ludwig Clarksrenshaw's office visit 06/19/13, patient discontinued warfarin himself and started aspirin since he did not want to come for INR checks.   When patient was being followed by anticoag clinic, his dose was 10 mg daily except 15 mg on MWF. Troponins negative.  Unable to evaluate patient for PE due to large body habitus.   Weight = 265 kg  Renal: SCr 1.01, CrCl>100 ml/min  CBC WNL  Baseline INR 1.13  Patient received first Lovenox dose of 1 mg/kg (260 mg) early this AM.  Pharmacy was waiting results of further PE workup to determine need for further doses.  Since unable to rule out PE at this time, will continue Lovenox.  Goal of Therapy:  INR 2-3 Anti-Xa level 0.6-1 units/ml 4hrs after LMWH dose given Enoxaparin dosed based on patient weight and renal function   Plan:  1.  Lovenox 260 mg (1 mg/kg) SQ q12h. 2.  Start with Coumadin 15 mg  tonight. 3.  Check PT/INR daily.  Typically check CBC at least q72h while on Lovenox, but will monitor daily due to large dose. 4.  Provide Coumadin education. 5.  F[facility].   Clance BollAmanda Ryane Konieczny, PharmD, BCPS Pager: [phone] 03/26/2014 2:13 PM

## 2014-03-26 NOTE — H&P (Signed)
PCP:  Emeterio Reeve, MD  [facility]  Chief Complaint:  Chest pain  HPI: [name] is a 43 y.o. male   has a past medical history of CHF (congestive heart failure); HTN (hypertension); Ptosis; Atrial flutter; Atrial fibrillation; Morbid obesity; DM2 (diabetes mellitus, type 2); Depression; Respiratory failure; Chronic low back pain; Diabetes mellitus; Asthma; Vision abnormalities; and Abscess.   Presented with  Chest pain for the past few days. Patient have been having increased activity recently. On Sunday he was at church and passed out "because it was very hot". Since then he has had pain in chest worse with movement. When patient suffered a syncope at church he has fallen hard on the chair behind him and since then have had generalized body aches.  Patient has chronic morbid obesity. He is supposed to be on coumadin for hx of a.fib but this was stopped due to miscommunication. Patient reports sensation of the breath getting caught in his throat. His d.dimer was noted to be elevated. Patietn has anaphylactic reaction to iodine. He is being admitted for further work up and VQ in AM to eval for PE.   Hospitalist was called for admission for chest pain  Review of Systems:    Pertinent positives include:  shortness of breath at rest. Syncope, body aches dyspnea on exertion,   Bilateral lower extremity swelling  Constitutional:  No weight loss, night sweats, Fevers, chills, fatigue, weight loss  HEENT:  No headaches, Difficulty swallowing,Tooth/dental problems,Sore throat,  No sneezing, itching, ear ache, nasal congestion, post nasal drip,  Cardio-vascular:  No chest pain, Orthopnea, PND, anasarca, dizziness, palpitations.no GI:  No heartburn, indigestion, abdominal pain, nausea, vomiting, diarrhea, change in bowel habits, loss of appetite, melena, blood in stool, hematemesis Resp:  no  No No excess mucus, no productive cough, No non-productive cough, No coughing up of  blood.No change in color of mucus.No wheezing. Skin:  no rash or lesions. No jaundice GU:  no dysuria, change in color of urine, no urgency or frequency. No straining to urinate.  No flank pain.  Musculoskeletal:  No joint pain or no joint swelling. No decreased range of motion. No back pain.  Psych:  No change in mood or affect. No depression or anxiety. No memory loss.  Neuro: no localizing neurological complaints, no tingling, no weakness, no double vision, no gait abnormality, no slurred speech, no confusion  Otherwise ROS are negative except for above, 10 systems were reviewed  Past Medical History: Past Medical History  Diagnosis Date  . CHF (congestive heart failure)   . HTN (hypertension)   . Ptosis   . Atrial flutter   . Atrial fibrillation   . Morbid obesity   . DM2 (diabetes mellitus, type 2)   . Depression   . Respiratory failure   . Chronic low back pain   . Diabetes mellitus   . Asthma   . Vision abnormalities   . Abscess     right thigh   Past Surgical History  Procedure Laterality Date  . Tonsillectomy    . Cholecystectomy    . Tonsillectomy    . Irrigation and debridement abscess  10/13/2011    Procedure: IRRIGATION AND DEBRIDEMENT ABSCESS;  Surgeon: Emelia Loron, MD;  Location: [facility] OR;  Service: General;  Laterality: Right;  Irrigation and debridement right thigh abscess  . Application of wound vac      right thigh abscess     Medications: Prior to Admission medications   Medication Sig  Start Date End Date Taking? Authorizing Provider  albuterol (VENTOLIN HFA) 108 (90 BASE) MCG/ACT inhaler Inhale 2 puffs into the lungs every 6 (six) hours as needed. Shortness of breath   Yes Historical Provider, MD  amiodarone (PACERONE) 200 MG tablet Take 200 mg by mouth daily.   Yes Historical Provider, MD  amLODipine (NORVASC) 10 MG tablet Take 10 mg by mouth daily.  11/13/11  Yes Historical Provider, MD  buPROPion (WELLBUTRIN XL) 300 MG 24 hr tablet Take 300  mg by mouth Daily.  03/13/12  Yes Historical Provider, MD  cyclobenzaprine (FLEXERIL) 10 MG tablet Take 10 mg by mouth 3 (three) times daily as needed for muscle spasms.   Yes Historical Provider, MD  divalproex (DEPAKOTE) 500 MG DR tablet Take 1,000 mg by mouth at bedtime.    Yes Historical Provider, MD  HUMALOG MIX 75/25 (75-25) 100 UNIT/ML SUSP Inject 40-75 Units into the skin Three times a day before meals. Takes 75 units before breakfast Takes 40 units before lunch Takes 65 units before supper 03/13/12  Yes Historical Provider, MD  lamoTRIgine (LAMICTAL) 100 MG tablet Take 100 mg by mouth daily.  10/23/11  Yes Historical Provider, MD  metoprolol (TOPROL-XL) 50 MG 24 hr tablet Take 50 mg by mouth daily. 05/10/11  Yes Lewayne BuntingBrian S Crenshaw, MD  nitroGLYCERIN (NITROSTAT) 0.4 MG SL tablet Place 0.4 mg under the tongue once.   Yes Historical Provider, MD  traMADol (ULTRAM) 50 MG tablet Take 50-100 mg by mouth every 6 (six) hours as needed for pain.   Yes Historical Provider, MD    Allergies:   Allergies  Allergen Reactions  . Iodine Anaphylaxis and Other (See Comments)    Associated with the shellfish allergy  . Shellfish Allergy Anaphylaxis    Social History:  Ambulatory   independently   Lives at home  With family     reports that he has never smoked. He does not have any smokeless tobacco history on file. He reports that he drinks alcohol. He reports that he does not use illicit drugs.    Family History: family history includes Cancer in his father and mother; Coronary artery disease in his mother; Heart disease in his father.    Physical Exam: Patient Vitals for the past 24 hrs:  BP Temp Temp src Pulse Resp SpO2  03/25/14 2300 154/86 mmHg - - - - -  03/25/14 2230 - - - 88 - 94 %  03/25/14 2218 155/81 mmHg - - 82 18 93 %  03/25/14 2215 - - - 83 - 93 %  03/25/14 2200 - - - 94 30 91 %  03/25/14 2145 - - - 81 18 94 %  03/25/14 2130 - - - 91 15 94 %  03/25/14 2115 - - - 86 15 95 %    03/25/14 2100 173/85 mmHg - - 86 20 95 %  03/25/14 2057 148/94 mmHg 97.7 F (36.5 C) Oral 90 16 95 %    1. General:  in No Acute distress 2. Psychological: Alert and   Oriented 3. Head/ENT:   Moist  Mucous Membranes                          Head Non traumatic, neck supple                          Normal  Dentition 4. SKIN: normal   Skin turgor,  Skin clean  Dry and intact no rash 5. Heart: Regular rate and rhythm no Murmur, Rub or gallop distant 6. Lungs: Clear to auscultation bilaterally, no wheezes or crackles  distant breath sounds 7. Abdomen: Soft, non-tender, Non distended severely obese 8. Lower extremities: no clubbing, cyanosis, mild edema 9. Neurologically Grossly intact, moving all 4 extremities equally 10. MSK: Normal range of motion chest wall pain reproducible by outpatient as well as movement of the arms  body mass index is unknown because there is no weight on file.   Labs on Admission:   Recent Labs  03/25/14 2110  NA 139  K 3.6*  CL 102  CO2 23  GLUCOSE 212*  BUN 11  CREATININE 0.94  CALCIUM 8.7   No results found for this basename: AST, ALT, ALKPHOS, BILITOT, PROT, ALBUMIN,  in the last 72 hours No results found for this basename: LIPASE, AMYLASE,  in the last 72 hours  Recent Labs  03/25/14 2110  WBC 10.6*  HGB 13.9  HCT 41.1  MCV 82.7  PLT 234   No results found for this basename: CKTOTAL, CKMB, CKMBINDEX, TROPONINI,  in the last 72 hours No results found for this basename: TSH, T4TOTAL, FREET3, T3FREE, THYROIDAB,  in the last 72 hours No results found for this basename: VITAMINB12, FOLATE, FERRITIN, TIBC, IRON, RETICCTPCT,  in the last 72 hours Lab Results  Component Value Date   HGBA1C 7.8* 04/20/2012    The CrCl is unknown because both a height and weight (above a minimum accepted value) are required for this calculation. ABG    Component Value Date/Time   PHART 7.408 04/19/2012 2050   HCO3 32.4* 04/19/2012 2050   TCO2 28.7 04/19/2012  2050   O2SAT 94.5 04/19/2012 2050     Lab Results  Component Value Date   DDIMER 0.73* 03/25/2014     Other results:  I have pearsonaly reviewed this: ECG REPORT  Rate: 94  Rhythm: SR ST&T Change: no ischemic changes   BNP (last 3 results)  Recent Labs  03/25/14 2110  PROBNP 33.8    There were no vitals filed for this visit.   Cultures:    Component Value Date/Time   SDES SPUTUM 04/21/2012 0457   SPECREQUEST NONE 04/21/2012 0457   CULT NO GROWTH 04/20/2012 0237   REPTSTATUS 04/21/2012 FINAL 04/21/2012 0457  Radiological Exams on Admission: Dg Chest Port 1 View  03/25/2014   CLINICAL DATA:  Chest pain.  EXAM: PORTABLE CHEST - 1 VIEW  COMPARISON:  05/09/2013  FINDINGS: The heart is enlarged but stable. The lungs are clear. No pleural effusion. No pneumothorax. The bony thorax is grossly normal.  IMPRESSION: No acute cardiopulmonary findings.   Electronically Signed   By: Loralie ChampagneMark  Gallerani M.D.   On: 03/25/2014 21:15    Chart has been reviewed  Assessment/Plan 43 year old gentleman with past medical history significant for atrial fibrillation, diabetes, hypertension, chronic diastolic heart failure, severe obesity presents with most likely musculoskeletal chest pain as well as recent syncope with elevated d-dimer  Present on Admission:  . Chest pain - most likely set musculoskeletal in the setting of recent fall. We'll cycle cardiac enzymes given risk factors given elevated d-dimer and recent syncope we'll obtain VQ scan in the morning give ibuprofen for musculoskeletal pain. So far troponin and EKG reassuring  . Atrial fibrillation - currently in sinus rhythm we'll continue amiodarone and metoprolol patient supposed to be on anticoagulation but could not qualify for Xarelto due to financial reasons will restart Coumadin .  DIABETES MELLITUS, TYPE II - while n.p.o. for father testing will switch to Lantus 70 units  . HTN (hypertension), benign - continue home medications  amlodipine and metoprol  . SLEEP APNEA  -  write for CPAP Syncope in the setting of exertion and being in the hot environment - cycle cardiac enzymes obtain carotid Dopplers and echo check orthostatics  Prophylaxis: Lovenox restarting Coumadin  CODE STATUS:  FULL CODE  Other plan as per orders.  I have spent a total of 55 min on this admission  DOUTOVA,ANASTASSIA 03/26/2014, 2:15 AM  [facility]  Pager [phone]   If 7AM-7PM, please contact the day team taking care of the patient  [email or web address]  Password [email or web address]

## 2014-03-26 NOTE — Progress Notes (Signed)
*  PRELIMINARY RESULTS* Vascular Ultrasound Carotid Doppler has been completed.  Preliminary findings: Technically limited due to body habitus. Bilateral: Appears to be 1-39% ICA stenosis.  Vertebral artery flow is antegrade.      Farrel DemarkJill Eunice, RDMS, RVT  03/26/2014, 10:40 AM

## 2014-03-26 NOTE — Progress Notes (Signed)
Pt placed on CPAP set at 16 CMH20 with 4 LPM O2 bleed in via FFM per Pt home settings.  Pt tolerating well at this time, RT to monitor and assess as needed.

## 2014-03-26 NOTE — Progress Notes (Signed)
ANTICOAGULATION CONSULT NOTE - Initial Consult  Pharmacy Consult for warfarin, enoxaparin Indication: atrial fibrillation and pulmonary embolus  Allergies  Allergen Reactions  . Iodine Anaphylaxis and Other (See Comments)    Associated with the shellfish allergy  . Shellfish Allergy Anaphylaxis    Patient Measurements: Height: 6' 3.2" (191 cm) Weight: 585 lb (265.354 kg) IBW/kg (Calculated) : 84.95 Heparin Dosing Weight:   Vital Signs: Temp: 97.5 F (36.4 C) (06/25 0303) Temp src: Oral (06/25 0303) BP: 149/76 mmHg (06/25 0510) Pulse Rate: 76 (06/25 0303)  Labs:  Recent Labs  03/25/14 2110 03/26/14 0435  HGB 13.9 13.3  HCT 41.1 40.1  PLT 234 207  CREATININE 0.94 1.01  1.00  TROPONINI  --  <0.30    Estimated Creatinine Clearance: 209.7 ml/min (by C-G formula based on Cr of 1.01).   Medical History: Past Medical History  Diagnosis Date  . CHF (congestive heart failure)   . HTN (hypertension)   . Ptosis   . Atrial flutter   . Atrial fibrillation   . Morbid obesity   . DM2 (diabetes mellitus, type 2)   . Depression   . Respiratory failure   . Chronic low back pain   . Diabetes mellitus   . Asthma   . Vision abnormalities   . Abscess     right thigh    Medications:  Scheduled:  . amiodarone  200 mg Oral Daily  . amLODipine  10 mg Oral Daily  . aspirin EC  81 mg Oral Daily  . buPROPion  300 mg Oral Daily  . divalproex  1,000 mg Oral QHS  . docusate sodium  100 mg Oral BID  . insulin aspart  0-9 Units Subcutaneous 6 times per day  . insulin glargine  70 Units Subcutaneous QHS  . lamoTRIgine  100 mg Oral Daily  . metoprolol succinate  50 mg Oral Daily  . sodium chloride  3 mL Intravenous Q12H  . sodium chloride  3 mL Intravenous Q12H  . warfarin  15 mg Oral ONCE-1800  . Warfarin - Pharmacist Dosing Inpatient   Does not apply q1800    Assessment: Patient with afib and r/o PE.  D-dimer elevated.   Prior warfarin dosing noted to be 15mg  and 20mg   doses.  Patient >200 kg. Lovenox 1mg /kg ordered and given this am.  VQ to rule out PE.  Goal of Therapy:  INR 2-3 Enoxaparin dosed based on patient weight and renal function   Plan:  Start with Coumadin 15 mg tonight. Check PT/INR daily. Provide Coumadin education. Enoxaparin 260mg  sq x1, will follow up to see if wish to continue.    Darlina GuysGrimsley Jr, Jacquenette ShoneJulian Crowford 03/26/2014,6:36 AM

## 2014-03-26 NOTE — Progress Notes (Signed)
Patient c/o increasing chest pain with SOB. Pain 7/10. Nitroglycerine 0.4 mg tab given x3 with slight relief, pain decreased to 5/10. O2 sat 95% at RA. O2 started at 3L/min n/c-O2 sat 95-96% with O2. EKG done- NSR with sinus arrythmia. Patient chest pain staying at 5/10 pain scale continuous since admission in ED until patient came to floor. BP 149/76, pt in no signs of distress at this time. MD aware.

## 2014-03-26 NOTE — Progress Notes (Signed)
  Echocardiogram 2D Echocardiogram has been performed.  [name] 03/26/2014, 3:16 PM 

## 2014-03-27 DIAGNOSIS — J9600 Acute respiratory failure, unspecified whether with hypoxia or hypercapnia: Secondary | ICD-10-CM

## 2014-03-27 DIAGNOSIS — J45909 Unspecified asthma, uncomplicated: Secondary | ICD-10-CM | POA: Diagnosis present

## 2014-03-27 LAB — GLUCOSE, CAPILLARY
GLUCOSE-CAPILLARY: 150 mg/dL — AB (ref 70–99)
Glucose-Capillary: 149 mg/dL — ABNORMAL HIGH (ref 70–99)
Glucose-Capillary: 150 mg/dL — ABNORMAL HIGH (ref 70–99)
Glucose-Capillary: 129 mg/dL — ABNORMAL HIGH (ref 70–99)
Glucose-Capillary: 222 mg/dL — ABNORMAL HIGH (ref 70–99)
Glucose-Capillary: 276 mg/dL — ABNORMAL HIGH (ref 70–99)

## 2014-03-27 LAB — CBC
HCT: 41.9 % (ref 39.0–52.0)
HEMOGLOBIN: 14.1 g/dL (ref 13.0–17.0)
MCH: 28.6 pg (ref 26.0–34.0)
MCHC: 33.7 g/dL (ref 30.0–36.0)
MCV: 85 fL (ref 78.0–100.0)
Platelets: 225 10*3/uL (ref 150–400)
RBC: 4.93 MIL/uL (ref 4.22–5.81)
RDW: 15.3 % (ref 11.5–15.5)
WBC: 8.9 10*3/uL (ref 4.0–10.5)

## 2014-03-27 LAB — PROTIME-INR
INR: 1.02 (ref 0.00–1.49)
Prothrombin Time: 13.4 seconds (ref 11.6–15.2)

## 2014-03-27 MED ORDER — PREDNISONE 50 MG PO TABS
50.0000 mg | ORAL_TABLET | Freq: Every day | ORAL | Status: DC
  Filled 2014-03-27: qty 1

## 2014-03-27 MED ORDER — METHYLPREDNISOLONE SODIUM SUCC 125 MG IJ SOLR
60.0000 mg | Freq: Two times a day (BID) | INTRAMUSCULAR | Status: DC
  Administered 2014-03-27 – 2014-03-30 (×6): 60 mg via INTRAVENOUS
  Filled 2014-03-27 (×7): qty 0.96

## 2014-03-27 MED ORDER — RIVAROXABAN (XARELTO) EDUCATION KIT FOR DVT/PE PATIENTS
PACK | Freq: Once | Status: AC
  Administered 2014-03-27: 12:00:00

## 2014-03-27 MED ORDER — WARFARIN SODIUM 7.5 MG PO TABS
15.0000 mg | ORAL_TABLET | Freq: Once | ORAL | Status: DC
Start: 2014-03-27 — End: 2014-03-27
  Filled 2014-03-27: qty 2

## 2014-03-27 MED ORDER — METHYLPREDNISOLONE SODIUM SUCC 125 MG IJ SOLR
125.0000 mg | Freq: Once | INTRAMUSCULAR | Status: AC
  Administered 2014-03-27: 125 mg via INTRAVENOUS
  Filled 2014-03-27: qty 2

## 2014-03-27 MED ORDER — INSULIN ASPART 100 UNIT/ML ~~LOC~~ SOLN
0.0000 [IU] | Freq: Every day | SUBCUTANEOUS | Status: DC
  Administered 2014-03-27: 3 [IU] via SUBCUTANEOUS
  Administered 2014-03-28: 4 [IU] via SUBCUTANEOUS
  Administered 2014-03-29: 2 [IU] via SUBCUTANEOUS
  Administered 2014-03-30: 3 [IU] via SUBCUTANEOUS

## 2014-03-27 MED ORDER — ALPRAZOLAM 1 MG PO TABS
1.0000 mg | ORAL_TABLET | Freq: Three times a day (TID) | ORAL | Status: DC | PRN
  Administered 2014-03-27 – 2014-03-30 (×7): 1 mg via ORAL
  Filled 2014-03-27 (×8): qty 1

## 2014-03-27 MED ORDER — INSULIN ASPART 100 UNIT/ML ~~LOC~~ SOLN
0.0000 [IU] | Freq: Three times a day (TID) | SUBCUTANEOUS | Status: DC
  Administered 2014-03-27: 3 [IU] via SUBCUTANEOUS
  Administered 2014-03-28: 5 [IU] via SUBCUTANEOUS
  Administered 2014-03-28: 9 [IU] via SUBCUTANEOUS
  Administered 2014-03-28: 3 [IU] via SUBCUTANEOUS
  Administered 2014-03-29: 5 [IU] via SUBCUTANEOUS
  Administered 2014-03-29 – 2014-03-30 (×3): 3 [IU] via SUBCUTANEOUS
  Administered 2014-03-30: 5 [IU] via SUBCUTANEOUS
  Administered 2014-03-30 – 2014-03-31 (×2): 3 [IU] via SUBCUTANEOUS

## 2014-03-27 MED ORDER — LEVALBUTEROL HCL 1.25 MG/0.5ML IN NEBU
1.2500 mg | INHALATION_SOLUTION | Freq: Three times a day (TID) | RESPIRATORY_TRACT | Status: DC
  Administered 2014-03-27 – 2014-03-31 (×13): 1.25 mg via RESPIRATORY_TRACT
  Filled 2014-03-27 (×16): qty 0.5

## 2014-03-27 MED ORDER — RIVAROXABAN 15 MG PO TABS
15.0000 mg | ORAL_TABLET | Freq: Two times a day (BID) | ORAL | Status: DC
  Administered 2014-03-27 – 2014-03-31 (×8): 15 mg via ORAL
  Filled 2014-03-27 (×10): qty 1

## 2014-03-27 NOTE — Progress Notes (Signed)
ANTICOAGULATION CONSULT NOTE - Follow Up   Pharmacy Consult for Xarelto Indication: atrial fibrillation and pulmonary embolus  Allergies  Allergen Reactions  . Iodine Anaphylaxis and Other (See Comments)    Associated with the shellfish allergy  . Shellfish Allergy Anaphylaxis    Patient Measurements: Height: 6' 3.2" (191 cm) Weight: 585 lb (265.354 kg) IBW/kg (Calculated) : 84.95  Vital Signs: Temp: 97.9 F (36.6 C) (06/26 0510) Temp src: Oral (06/26 0510) BP: 170/91 mmHg (06/26 0510) Pulse Rate: 68 (06/26 0510)  Labs:  Recent Labs  03/25/14 2110 03/26/14 0435 03/26/14 1010 03/26/14 1634 03/27/14 0525  HGB 13.9 13.3  --   --  14.1  HCT 41.1 40.1  --   --  41.9  PLT 234 207  --   --  225  LABPROT  --   --  14.5  --  13.4  INR  --   --  1.13  --  1.02  CREATININE 0.94 1.01  1.00  --   --   --   TROPONINI  --  <0.30 <0.30 <0.30  --     Estimated Creatinine Clearance: 209.7 ml/min (by C-G formula based on Cr of 1.01).   Assessment: 43 yo obese male with history of afib presents with chest pain, recent syncope, and elevated d-dimer.  Patient is supposed to be on warfarin for history of afib but per Dr. Ludwig Clarksrenshaw's office visit 06/19/13, patient discontinued warfarin himself and started aspirin since he did not want to come for INR checks.  Troponins negative.  Unable to evaluate patient for PE due to large body habitus.  Pharmacy consulted to dose warfarin with Lovenox bridge on admission.  Now, MD would like to switch to Xarelto.   Weight = 265 kg; No dosage adjustment required for Xarelto dosing.  Body weight >120 kg did not significantly influence rivaroxaban exposure (Kubitza, 2007).  Renal: SCr 1.01, CrCl>100 ml/min  CBC WNL  INR 1.02  Goal of Therapy:  VTE Treatment   Plan:  1.  Stop Lovenox and warfarin.  Patient only received one 15 mg dose of warfarin 6/25.  Last dose of Lovenox 260 mg (1 mg/kg) was this morning (6/26) at 0540. 2.  Start Xarelto 15 mg  BID x 21 days followed by 20 mg daily.  Start first Xarelto dose at 1800 tonight (time next Lovenox dose would have been due). 3.  Will provide Xarelto education and provide patient with Xarelto starter pack discount card.   Clance BollAmanda Runyon, PharmD, BCPS Pager: [phone] 03/27/2014 11:53 AM

## 2014-03-27 NOTE — Progress Notes (Addendum)
Patient ID: [name], male   DOB: 05/13/1971, 43 y.o.   MRN: [id number] [facility] PROGRESS NOTE  Evan Marchaul J [place] NWG:[id number] DOB: 10/16/1970 DOA: 03/25/2014 PCP: Emeterio ReeveWOLTERS,SHARON A, MD  Brief narrative: 43 y.o. male with multiple medical comorbidities including but not limited to morbid obesity, OSA with hypoventilation on CPAP, atrial fibrillation on coumadin who [facility] with worsening shortness of breath, chest tightness and coughing for past few days prior to this admission. CXR showed no acute cardiopulmonary process.   Assessment/Plan:   Principal Problem:  Acute respiratory failure with hypoxia / OSA and OHS / Asthma exacerbation  Patient feels shortness of breath this morning, worse than yesterday. We will start with Solu-Medrol 125 mg one dose IV. After that we will initiate Solu-Medrol 60 mg IV twice daily. Continue with bronchodilators scheduled and as needed. CPAP at bedtime. Unable to do perfusion scan for evaluation of possible PE secondary to weight. Please note that patient is on anticoagulation with Coumadin at home but has been noncompliant. We started xarelto.   Active Problems:  DIABETES MELLITUS, TYPE II  A1c is 6.8 on this admission indicating god glycemic control  Continue insulin 70 units at bedtime and SSI Atrial fibrillation  Patient is on anticoagulation with Coumadin at home however has been noncompliant. We have started xarelto on this admission.  Continue amiodarone. OHS/OSA  CPAP at bedtime  CHEST PAIN / Near syncope  Cardiac enzymes x 2 WNL  Chest pain resolved  Continue aspirin daily  No acute findings on carotid doppler  Morbid obesity with body mass index of 60.0-69.9 in adult  Educated on diet  HTN (hypertension), benign  Continue Norvasc 10 mg daily, metoprolol 50 mg daily  Depression  Continue Wellbutrin    DVT prophylaxis: on therapeutic anticoagulation with xarelto. He was given Coumadin and Lovenox on the  admission and this was changed to xarelto on 03/27/2014  Code Status: full code  Family Communication: plan of care discussed with the patient  Disposition Plan: home when stable   Consultants:  None  Procedures:  Carotid doppler Antibiotics:  None   Manson PasseyEVINE, ALMA, MD  [facility] Pager [phone]  If 7PM-7AM, please contact night-coverage [email or web address] Password [email or web address] 03/27/2014, 2:08 PM   LOS: 2 days    HPI/Subjective: No acute overnight events. Feels worse this morning. Short of breath.  Objective: Filed Vitals:   03/26/14 2047 03/26/14 2116 03/27/14 0232 03/27/14 0510  BP: 159/98  132/71 170/91  Pulse: 64  64 68  Temp: 97.3 F (36.3 C)  97.6 F (36.4 C) 97.9 F (36.6 C)  TempSrc: Oral  Oral Oral  Resp: 18  20 20   Height:      Weight:      SpO2: 97% 98% 93% 92%    Intake/Output Summary (Last 24 hours) at 03/27/14 1408 Last data filed at 03/27/14 1250  Gross per 24 hour  Intake   1440 ml  Output   1150 ml  Net    290 ml    Exam:   General:  Pt is alert, mild distress due to shortness of breath   Cardiovascular: Regular rate and rhythm, S1/S2, no murmurs  Respiratory: Diminished breath sounds bilaterally, no wheezing  Abdomen: Obese, nontender, non distended, bowel sounds present  Extremities: Lymphedema bilaterally, pulses DP and PT palpable bilaterally  Neuro: Grossly nonfocal  Data Reviewed: Basic Metabolic Panel:  Recent Labs Lab 03/25/14 2110 03/26/14 0435  NA 139 141  K 3.6* 3.8  CL  102 104  CO2 23 24  GLUCOSE 212* 178*  BUN 11 11  CREATININE 0.94 1.01  1.00  CALCIUM 8.7 8.6  MG  --  1.8  PHOS  --  3.4   Liver Function Tests:  Recent Labs Lab 03/26/14 0435  AST 12  ALT 6  ALKPHOS 58  BILITOT 0.4  PROT 6.9  ALBUMIN 3.3*   No results found for this basename: LIPASE, AMYLASE,  in the last 168 hours No results found for this basename: AMMONIA,  in the last 168 hours CBC:  Recent Labs Lab 03/25/14 2110  03/26/14 0435 03/27/14 0525  WBC 10.6* 9.8 8.9  HGB 13.9 13.3 14.1  HCT 41.1 40.1 41.9  MCV 82.7 84.4 85.0  PLT 234 207 225   Cardiac Enzymes:  Recent Labs Lab 03/26/14 0435 03/26/14 1010 03/26/14 1634  TROPONINI <0.30 <0.30 <0.30   BNP: No components found with this basename: POCBNP,  CBG:  Recent Labs Lab 03/26/14 2046 03/27/14 0027 03/27/14 0458 03/27/14 0737 03/27/14 1149  GLUCAP 158* 150* 149* 150* 129*    No results found for this or any previous visit (from the past 240 hour(s)).   Studies: Dg Chest Port 1 View  03/25/2014   CLINICAL DATA:  Chest pain.  EXAM: PORTABLE CHEST - 1 VIEW  COMPARISON:  05/09/2013  FINDINGS: The heart is enlarged but stable. The lungs are clear. No pleural effusion. No pneumothorax. The bony thorax is grossly normal.  IMPRESSION: No acute cardiopulmonary findings.   Electronically Signed   By: Loralie ChampagneMark  Gallerani M.D.   On: 03/25/2014 21:15    Scheduled Meds: . amiodarone  200 mg Oral Daily  . amLODipine  10 mg Oral Daily  . buPROPion  300 mg Oral Daily  . divalproex  1,000 mg Oral QHS  . docusate sodium  100 mg Oral BID  . insulin aspart  0-9 Units Subcutaneous 6 times per day  . insulin glargine  70 Units Subcutaneous QHS  . lamoTRIgine  100 mg Oral Daily  . levalbuterol  1.25 mg Nebulization TID  . methylPREDNISolone (SOLU-MEDROL) injection  60 mg Intravenous Q12H  . metoprolol succinate  50 mg Oral Daily  . Rivaroxaban  15 mg Oral BID WC  . sodium chloride  3 mL Intravenous Q12H   Continuous Infusions:

## 2014-03-27 NOTE — Progress Notes (Signed)
[facility] the [facility]: CPAP (I have faxed in orders for the new setting of 16-20)  If patient discharges after hours, please call [phone].   Renard HamperLecretia Williamson 03/27/2014, 10:44 AM

## 2014-03-27 NOTE — Progress Notes (Signed)
ANTICOAGULATION CONSULT NOTE - Follow Up  Pharmacy Consult for warfarin, enoxaparin Indication: atrial fibrillation and pulmonary embolus  Allergies  Allergen Reactions  . Iodine Anaphylaxis and Other (See Comments)    Associated with the shellfish allergy  . Shellfish Allergy Anaphylaxis    Patient Measurements: Height: 6' 3.2" (191 cm) Weight: 585 lb (265.354 kg) IBW/kg (Calculated) : 84.95  Vital Signs: Temp: 97.9 F (36.6 C) (06/26 0510) Temp src: Oral (06/26 0510) BP: 170/91 mmHg (06/26 0510) Pulse Rate: 68 (06/26 0510)  Labs:  Recent Labs  03/25/14 2110 03/26/14 0435 03/26/14 1010 03/26/14 1634 03/27/14 0525  HGB 13.9 13.3  --   --  14.1  HCT 41.1 40.1  --   --  41.9  PLT 234 207  --   --  225  LABPROT  --   --  14.5  --  13.4  INR  --   --  1.13  --  1.02  CREATININE 0.94 1.01  1.00  --   --   --   TROPONINI  --  <0.30 <0.30 <0.30  --     Estimated Creatinine Clearance: 209.7 ml/min (by C-G formula based on Cr of 1.01).   Assessment: 43 yo obese male with history of afib presents with chest pain, recent syncope, and elevated d-dimer.  Patient is supposed to be on warfarin for history of afib but per Dr. Ludwig Clarksrenshaw's office visit 06/19/13, patient discontinued warfarin himself and started aspirin since he did not want to come for INR checks.   When patient was being followed by anticoag clinic, his dose was 10 mg daily except 15 mg on MWF. Troponins negative.  Unable to evaluate patient for PE due to large body habitus.  Pharmacy consulted to dose warfarin with Lovenox bridge.   Weight = 265 kg  Renal: SCr 1.01, CrCl>100 ml/min  CBC WNL  INR 1.02  Goal of Therapy:  INR 2-3 Anti-Xa level 0.6-1 units/ml 4hrs after LMWH dose given Enoxaparin dosed based on patient weight and renal function   Plan:  1.  Lovenox 260 mg (1 mg/kg) SQ q12h. 2.  Coumadin 15 mg tonight. 3.  Check PT/INR daily.  Typically check CBC at least q72h while on Lovenox, but will  monitor daily due to large dose. 4.  Provide Coumadin education. 5.  F/u if further work up for to evaluate for PE can be performed.   Clance BollAmanda Runyon, PharmD, BCPS Pager: [phone] 03/27/2014 7:11 AM

## 2014-03-27 NOTE — Discharge Instructions (Addendum)
Information on my medicine - XARELTO (rivaroxaban)  This medication education was reviewed with me or my healthcare representative as part of my discharge preparation.  The pharmacist that spoke with me during my hospital stay was:  Clance Boll, [facility]  WHY WAS XARELTO PRESCRIBED FOR YOU? 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  What do you need to know about Xarelto? The starting dose is one 15 mg tablet taken TWICE daily with food for the FIRST 21 DAYS then on (enter date)  04/18/14  the dose is changed to one 20 mg tablet taken ONCE A DAY with your evening meal.  DO NOT stop taking Xarelto without talking to the health care provider who prescribed the medication.  Refill your prescription for 20 mg tablets before you run out.  After discharge, you should have regular check-up appointments with your healthcare provider that is prescribing your Xarelto.  In the future your dose may need to be changed if your kidney function changes by a significant amount.  What do you do if you miss a dose? If you are taking Xarelto TWICE DAILY and you miss a dose, take it as soon as you remember. You may take two 15 mg tablets (total 30 mg) at the same time then resume your regularly scheduled 15 mg twice daily the next day.  If you are taking Xarelto ONCE DAILY and you miss a dose, take it as soon as you remember on the same day then continue your regularly scheduled once daily regimen the next day. Do not take two doses of Xarelto at the same time.   Important Safety Information Xarelto is a blood thinner medicine that can cause bleeding. You should call your healthcare provider right away if you experience any of the following:   Bleeding from an injury or your nose that does not stop.   Unusual colored urine (red or dark brown) or  unusual colored stools (red or black).   Unusual bruising for unknown reasons.   A serious fall or if you hit your head (even if there is no bleeding).  Some medicines may interact with Xarelto and might increase your risk of bleeding while on Xarelto. To help avoid this, consult your healthcare provider or pharmacist prior to using any new prescription or non-prescription medications, including herbals, vitamins, non-steroidal anti-inflammatory drugs (NSAIDs) and supplements.  This website has more information on Xarelto: [email or web address].  Asthma, Acute Bronchospasm Acute bronchospasm caused by asthma is also referred to as an asthma attack. Bronchospasm means your air passages become narrowed. The narrowing is caused by inflammation and tightening of the muscles in the air tubes (bronchi) in your lungs. This can make it hard to breathe or cause you to wheeze and cough. CAUSES Possible triggers are:  Animal dander from the skin, hair, or feathers of animals.  Dust mites contained in house dust.  Cockroaches.  Pollen from trees or grass.  Mold.  Cigarette or tobacco smoke.  Air pollutants such as dust, household cleaners, hair sprays, aerosol sprays, paint fumes, strong chemicals, or strong odors.  Cold air or weather changes. Cold air may trigger inflammation. Winds increase molds and pollens in the air.  Strong emotions such as crying or laughing hard.  Stress.  Certain medicines such as aspirin or beta-blockers.  Sulfites in foods and drinks, such as  dried fruits and wine.  Infections or inflammatory conditions, such as a flu, cold, or inflammation of the nasal membranes (rhinitis).  Gastroesophageal reflux disease (GERD). GERD is a condition where stomach acid backs up into your esophagus.  Exercise or strenuous activity. SIGNS AND SYMPTOMS   Wheezing.  Excessive coughing, particularly at night.  Chest tightness.  Shortness of breath. DIAGNOSIS  Your health  care provider will ask you about your medical history and perform a physical exam. A chest X-ray or blood testing may be performed to look for other causes of your symptoms or other conditions that may have triggered your asthma attack. TREATMENT  Treatment is aimed at reducing inflammation and opening up the airways in your lungs. Most asthma attacks are treated with inhaled medicines. These include quick relief or rescue medicines (such as bronchodilators) and controller medicines (such as inhaled corticosteroids). These medicines are sometimes given through an inhaler or a nebulizer. Systemic steroid medicine taken by mouth or given through an IV tube also can be used to reduce the inflammation when an attack is moderate or severe. Antibiotic medicines are only used if a bacterial infection is present.  HOME CARE INSTRUCTIONS   Rest.  Drink plenty of liquids. This helps the mucus to remain thin and be easily coughed up. Only use caffeine in moderation and do not use alcohol until you have recovered from your illness.  Do not smoke. Avoid being exposed to secondhand smoke.  You play a critical role in keeping yourself in good health. Avoid exposure to things that cause you to wheeze or to have breathing problems.  Keep your medicines up-to-date and available. Carefully follow your health care provider's treatment plan.  Take your medicine exactly as prescribed.  When pollen or pollution is bad, keep windows closed and use an air conditioner or go to places with air conditioning.  Asthma requires careful medical care. See your health care provider for a follow-up as advised. If you are more than [redacted] weeks pregnant and you were prescribed any new medicines, let your obstetrician know about the visit and how you are doing. Follow up with your health care provider as directed.  After you have recovered from your asthma attack, make an appointment with your outpatient doctor to talk about ways to  reduce the likelihood of future attacks. If you do not have a doctor who manages your asthma, make an appointment with a primary care doctor to discuss your asthma. SEEK IMMEDIATE MEDICAL CARE IF:   You are getting worse.  You have trouble breathing. If severe, call your local emergency services (911 in the [place]  You develop chest pain or discomfort.  You are vomiting.  You are not able to keep fluids down.  You are coughing up yellow, green, brown, or bloody sputum.  You have a fever and your symptoms suddenly get worse.  You have trouble swallowing. MAKE SURE YOU:   Understand these instructions.  Will watch your condition.  Will get help right away if you are not doing well or get worse. Document Released: 01/03/2007 Document Revised: 09/23/2013 Document Reviewed: 03/26/2013 [facility] Patient Information 2015 [place], [place]. This information is not intended to replace advice given to you by your health care provider. Make sure you discuss any questions you have with your health care provider.  Asthma Attack Prevention Although there is no way to prevent asthma from starting, you can take steps to control the disease and reduce its symptoms. Learn about your asthma and how  to control it. Take an active role to control your asthma by working with your health care provider to create and follow an asthma action plan. An asthma action plan guides you in:  Taking your medicines properly.  Avoiding things that set off your asthma or make your asthma worse (asthma triggers).  Tracking your level of asthma control.  Responding to worsening asthma.  Seeking emergency care when needed. To track your asthma, keep records of your symptoms, check your peak flow number using a handheld device that shows how well air moves out of your lungs (peak flow meter), and get regular asthma checkups.  WHAT ARE SOME WAYS TO PREVENT AN ASTHMA ATTACK?  Take medicines as directed by your health  care provider.  Keep track of your asthma symptoms and level of control.  With your health care provider, write a detailed plan for taking medicines and managing an asthma attack. Then be sure to follow your action plan. Asthma is an ongoing condition that needs regular monitoring and treatment.  Identify and avoid asthma triggers. Many outdoor allergens and irritants (such as pollen, mold, cold air, and air pollution) can trigger asthma attacks. Find out what your asthma triggers are and take steps to avoid them.  Monitor your breathing. Learn to recognize warning signs of an attack, such as coughing, wheezing, or shortness of breath. Your lung function may decrease before you notice any signs or symptoms, so regularly measure and record your peak airflow with a home peak flow meter.  Identify and treat attacks early. If you act quickly, you are less likely to have a severe attack. You will also need less medicine to control your symptoms. When your peak flow measurements decrease and alert you to an upcoming attack, take your medicine as instructed and immediately stop any activity that may have triggered the attack. If your symptoms do not improve, get medical help.  Pay attention to increasing quick-relief inhaler use. If you find yourself relying on your quick-relief inhaler, your asthma is not under control. See your health care provider about adjusting your treatment. WHAT CAN MAKE MY SYMPTOMS WORSE? A number of common things can set off or make your asthma symptoms worse and cause temporary increased inflammation of your airways. Keep track of your asthma symptoms for several weeks, detailing all the environmental and emotional factors that are linked with your asthma. When you have an asthma attack, go back to your asthma diary to see which factor, or combination of factors, might have contributed to it. Once you know what these factors are, you can take steps to control many of them. If you  have allergies and asthma, it is important to take asthma prevention steps at home. Minimizing contact with the substance to which you are allergic will help prevent an asthma attack. Some triggers and ways to avoid these triggers are: Animal Dander:  Some people are allergic to the flakes of skin or dried saliva from animals with fur or feathers.   There is no such thing as a hypoallergenic dog or cat breed. All dogs or cats can cause allergies, even if they don't shed.  Keep these pets out of your home.  If you are not able to keep a pet outdoors, keep the pet out of your bedroom and other sleeping areas at all times, and keep the door closed.  Remove carpets and furniture covered with cloth from your home. If that is not possible, keep the pet away from fabric-covered furniture and  carpets. Dust Mites: Many people with asthma are allergic to dust mites. Dust mites are tiny bugs that are found in every home in mattresses, pillows, carpets, fabric-covered furniture, bedcovers, clothes, stuffed toys, and other fabric-covered items.   Cover your mattress in a special dust-proof cover.  Cover your pillow in a special dust-proof cover, or wash the pillow each week in hot water. Water must be hotter than 130 F (54.4 C) to kill dust mites. Cold or warm water used with detergent and bleach can also be effective.  Wash the sheets and blankets on your bed each week in hot water.  Try not to sleep or lie on cloth-covered cushions.  Call ahead when traveling and ask for a smoke-free hotel room. Bring your own bedding and pillows in case the hotel only supplies feather pillows and down comforters, which may contain dust mites and cause asthma symptoms.  Remove carpets from your bedroom and those laid on concrete, if you can.  Keep stuffed toys out of the bed, or wash the toys weekly in hot water or cooler water with detergent and bleach. Cockroaches: Many people with asthma are allergic to the  droppings and remains of cockroaches.   Keep food and garbage in closed containers. Never leave food out.  Use poison baits, traps, powders, gels, or paste (for example, boric acid).  If a spray is used to kill cockroaches, stay out of the room until the odor goes away. Indoor Mold:  Fix leaky faucets, pipes, or other sources of water that have mold around them.  Clean floors and moldy surfaces with a fungicide or diluted bleach.  Avoid using humidifiers, vaporizers, or swamp coolers. These can spread molds through the air. Pollen and Outdoor Mold:  When pollen or mold spore counts are high, try to keep your windows closed.  Stay indoors with windows closed from late morning to afternoon. Pollen and some mold spore counts are highest at that time.  Ask your health care provider whether you need to take anti-inflammatory medicine or increase your dose of the medicine before your allergy season starts. Other Irritants to Avoid:  Tobacco smoke is an irritant. If you smoke, ask your health care provider how you can quit. Ask family members to quit smoking, too. Do not allow smoking in your home or car.  If possible, do not use a wood-burning stove, kerosene heater, or fireplace. Minimize exposure to all sources of smoke, including incense, candles, fires, and fireworks.  Try to stay away from strong odors and sprays, such as perfume, talcum powder, hair spray, and paints.  Decrease humidity in your home and use an indoor air cleaning device. Reduce indoor humidity to below 60%. Dehumidifiers or central air conditioners can do this.  Decrease house dust exposure by changing furnace and air cooler filters frequently.  Try to have someone else vacuum for you once or twice a week. Stay out of rooms while they are being vacuumed and for a short while afterward.  If you vacuum, use a dust mask from a hardware store, a double-layered or microfilter vacuum cleaner bag, or a vacuum cleaner with  a HEPA filter.  Sulfites in foods and beverages can be irritants. Do not drink beer or wine or eat dried fruit, processed potatoes, or shrimp if they cause asthma symptoms.  Cold air can trigger an asthma attack. Cover your nose and mouth with a scarf on cold or windy days.  Several health conditions can make asthma more difficult to manage,  including a runny nose, sinus infections, reflux disease, psychological stress, and sleep apnea. Work with your health care provider to manage these conditions.  Avoid close contact with people who have a respiratory infection such as a cold or the flu, since your asthma symptoms may get worse if you catch the infection. Wash your hands thoroughly after touching items that may have been handled by people with a respiratory infection.  Get a flu shot every year to protect against the flu virus, which often makes asthma worse for days or weeks. Also get a pneumonia shot if you have not previously had one. Unlike the flu shot, the pneumonia shot does not need to be given yearly. Medicines:  Talk to your health care provider about whether it is safe for you to take aspirin or non-steroidal anti-inflammatory medicines (NSAIDs). In a small number of people with asthma, aspirin and NSAIDs can cause asthma attacks. These medicines must be avoided by people who have known aspirin-sensitive asthma. It is important that people with aspirin-sensitive asthma read labels of all over-the-counter medicines used to treat pain, colds, coughs, and fever.  Beta-blockers and ACE inhibitors are other medicines you should discuss with your health care provider. HOW CAN I FIND OUT WHAT I AM ALLERGIC TO? Ask your asthma health care provider about allergy skin testing or blood testing (the RAST test) to identify the allergens to which you are sensitive. If you are found to have allergies, the most important thing to do is to try to avoid exposure to any allergens that you are sensitive  to as much as possible. Other treatments for allergies, such as medicines and allergy shots (immunotherapy) are available.  CAN I EXERCISE? Follow your health care provider's advice regarding asthma treatment before exercising. It is important to maintain a regular exercise program, but vigorous exercise or exercise in cold, humid, or dry environments can cause asthma attacks, especially for those people who have exercise-induced asthma. Document Released: 09/06/2009 Document Revised: 09/23/2013 Document Reviewed: 03/26/2013 [facility] Patient Information 2015 [place]. This information is not intended to replace advice given to you by your health care provider. Make sure you discuss any questions you have with your health care provider.  Asthma, Adult Asthma is a condition of the lungs in which the airways tighten and narrow. Asthma can make it hard to breathe. Asthma cannot be cured, but medicine and lifestyle changes can help control it. Asthma may be started (triggered) by:  Animal skin flakes (dander).  Dust.  Cockroaches.  Pollen.  Mold.  Smoke.  Cleaning products.  Hair sprays or aerosol sprays.  Paint fumes or strong smells.  Cold air, weather changes, and winds.  Crying or laughing hard.  Stress.  Certain medicines or drugs.  Foods, such as dried fruit, potato chips, and sparkling grape juice.  Infections or conditions (colds, flu).  Exercise.  Certain medical conditions or diseases.  Exercise or tiring activities. HOME CARE   Take medicine as told by your doctor.  Use a peak flow meter as told by your doctor. A peak flow meter is a tool that measures how well the lungs are working.  Record and keep track of the peak flow meter's readings.  Understand and use the asthma action plan. An asthma action plan is a written plan for taking care of your asthma and treating your attacks.  To help prevent asthma attacks:  Do not smoke. Stay away from  secondhand smoke.  Change your heating and air conditioning filter often.  Limit your use of fireplaces and wood stoves.  Get rid of pests (such as roaches and mice) and their droppings.  Throw away plants if you see mold on them.  Clean your floors. Dust regularly. Use cleaning products that do not smell.  Have someone vacuum when you are not home. Use a vacuum cleaner with a HEPA filter if possible.  Replace carpet with wood, tile, or vinyl flooring. Carpet can trap animal skin flakes and dust.  Use allergy-proof pillows, mattress covers, and box spring covers.  Wash bed sheets and blankets every week in hot water and dry them in a dryer.  Use blankets that are made of polyester or cotton.  Clean bathrooms and kitchens with bleach. If possible, have someone repaint the walls in these rooms with mold-resistant paint. Keep out of the rooms that are being cleaned and painted.  Wash hands often. GET HELP IF:  You have make a whistling sound when breaking (wheeze), have shortness of breath, or have a cough even if taking medicine to prevent attacks.  The colored mucus you cough up (sputum) is thicker than usual.  The colored mucus you cough up changes from clear or white to yellow, green, gray, or bloody.  You have problems from the medicine you are taking such as:  A rash.  Itching.  Swelling.  Trouble breathing.  You need reliever medicines more than 2-3 times a week.  Your peak flow measurement is still at 50-79% of your personal best after following the action plan for 1 hour. GET HELP RIGHT AWAY IF:   You seem to be worse and are not responding to medicine during an asthma attack.  You are short of breath even at rest.  You get short of breath when doing very little activity.  You have trouble eating, drinking, or talking.  You have chest pain.  You have a fast heartbeat.  Your lips or fingernails start to turn blue.  You are lightheaded, dizzy, or  faint.  Your peak flow is less than 50% of your personal best.  You have a fever or lasting symptoms for more than 2-3 days.  You have a fever and your symptoms suddenly get worse. MAKE SURE YOU:   Understand these instructions.  Will watch your condition.  Will get help right away if you are not doing well or get worse. Document Released: 03/06/2008 Document Revised: 07/09/2013 Document Reviewed: 04/17/2013 [facility] Patient Information 2015 [place], [place]. This information is not intended to replace advice given to you by your health care provider. Make sure you discuss any questions you have with your health care provider.

## 2014-03-27 NOTE — Plan of Care (Signed)
Problem: Food- and Nutrition-Related Knowledge Deficit (NB-1.1) Goal: Nutrition education Formal process to instruct or train a patient/client in a skill or to impart knowledge to help patients/clients voluntarily manage or modify food choices and eating behavior to maintain or improve health. Outcome: Completed/Met Date Met:  03/27/14  RD received verbal consult from RN for nutrition education regarding diabetes/weight management    Lab Results  Component Value Date    HGBA1C 6.8* 03/26/2014    RD provided "Carbohydrate Counting for People with Diabetes" handout from the Academy of [employer] Dietetics. Discussed different food groups and their effects on blood sugar, emphasizing carbohydrate-containing foods. Provided list of carbohydrates and recommended serving sizes of common foods.  Discussed importance of controlled and consistent carbohydrate intake throughout the day. Provided examples of ways to balance meals/snacks and encouraged intake of high-fiber, whole grain complex carbohydrates. Teach back method used.  Expect good compliance. Pt with excellent recall of previous DM/insulin educations and recommendations. Pt is aware of importance of portion control, carbohydrate counting, and balanced meals. Reviewed appropriate servings sizes, nutrition label reading, and snack ideas. Encouraged intake of high fiber, lean proteins, and adequate hydration to prevent overeating and assist in weight maintenance. Provided pt with multiple sample menus, snack idea lists, and MyPlate portion sizes handouts for meals and snack ideas  Body mass index is 72.74 kg/(m^2). 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  Current diet order is Heart Healthy/Carb Modified, patient is consuming approximately 100% of meals at this time. Labs and medications reviewed. No further nutrition interventions warranted at this time. RD contact information provided. If additional nutrition  issues arise, please re-consult RD.  Evan Abide MS RD LDN Clinical Dietitian TELMR:[phone]

## 2014-03-28 LAB — GLUCOSE, CAPILLARY
Glucose-Capillary: 227 mg/dL — ABNORMAL HIGH (ref 70–99)
Glucose-Capillary: 256 mg/dL — ABNORMAL HIGH (ref 70–99)
Glucose-Capillary: 352 mg/dL — ABNORMAL HIGH (ref 70–99)

## 2014-03-28 MED ORDER — HYDRALAZINE HCL 10 MG PO TABS
10.0000 mg | ORAL_TABLET | Freq: Three times a day (TID) | ORAL | Status: DC
  Administered 2014-03-28 – 2014-03-31 (×9): 10 mg via ORAL
  Filled 2014-03-28 (×12): qty 1

## 2014-03-28 NOTE — Progress Notes (Addendum)
Patient ID: Evan Marchaul J Lites, male   DOB: 08/14/1971, 43 y.o.   MRN: [id number] [facility] PROGRESS NOTE  [name] [id number] DOB: 03/22/1971 DOA: 03/25/2014 PCP: Emeterio ReeveWOLTERS,SHARON A, MD  Brief narrative: 43 y.o. male with multiple medical comorbidities including but not limited to morbid obesity, OSA with hypoventilation on CPAP, atrial fibrillation on coumadin who [facility] with worsening shortness of breath, chest tightness and coughing for past few days prior to this admission. CXR showed no acute cardiopulmonary process.   Assessment/Plan:   Principal Problem:  Acute respiratory failure with hypoxia / OSA and OHS / Asthma exacerbation  Patient was more short of breath 6/26 and we started solumedrol 125 mg x once IV and then 60 mg IV Q 12 hours. Since he is still short of breath with some wheezing we will continue current steroids and BD's. Encouraged to use CPAP at bedtime.  Unable to do perfusion scan for evaluation of possible PE secondary to weight. Please note that patient is on anticoagulation with Coumadin at home but has been noncompliant. Started xarelto. No evidence of bleeding. Active Problems:  DIABETES MELLITUS, TYPE II  A1c is 6.8 on this admission indicating god glycemic control  Continue insulin 70 units at bedtime and SSI CBG's in past 24 hours: 222, 276, 227 Will ask DM coordinator for input on increasing lantus temporarily while pt on steroids  Atrial fibrillation  Patient is on anticoagulation with Coumadin at home however has been noncompliant. We have started xarelto on this admission.  Continue amiodarone. OHS/OSA  CPAP at bedtime  CHEST PAIN / Near syncope  Cardiac enzymes x 2 WNL  Chest pain resolved  Continue xarelto, aspirin not required due to increased risk of bleeding since he is already on xarelto  No acute findings on carotid doppler  Morbid obesity with body mass index of 60.0-69.9 in adult  Educated on diet  HTN  (hypertension), benign  Continue Norvasc 10 mg daily, metoprolol 50 mg daily  Add hydralazine 10 mg PO Q 8 hours for better BP control as SBP this am 176 Depression / Anxiety  Continue Wellbutrin  Started xanax 1 mg every 8 hours PRN   DVT prophylaxis: on therapeutic anticoagulation with xarelto. He was given Coumadin and Lovenox on the admission and this was changed to xarelto on 03/27/2014   Code Status: full code  Family Communication: plan of care discussed with the patient  Disposition Plan: home when stable   Consultants:  None  Procedures:  Carotid doppler Antibiotics:  None    Manson PasseyEVINE, ALMA, MD  [facility] Pager [phone]  If 7PM-7AM, please contact night-coverage [email or web address] Password [email or web address] 03/28/2014, 9:10 AM   LOS: 3 days    HPI/Subjective: No acute overnight events. Complains of back pain and says he is still short of breath.   Objective: Filed Vitals:   03/28/14 0002 03/28/14 0406 03/28/14 0500 03/28/14 0837  BP:  176/88    Pulse: 72 76    Temp:  97.7 F (36.5 C)    TempSrc:  Oral    Resp: 20 20    Height:      Weight:   264.52 kg (583 lb 2.6 oz)   SpO2: 93% 93%  94%    Intake/Output Summary (Last 24 hours) at 03/28/14 0910 Last data filed at 03/28/14 0739  Gross per 24 hour  Intake   2043 ml  Output   1325 ml  Net    718 ml    Exam:  General:  Pt is alert, sitting in chair, morbidly obese   Cardiovascular: Regular rate and rhythm, S1/S2 appreciated   Respiratory: diminished breath sounds with wheezing in upper lung lobes  Abdomen: Soft, non tender, non distended, bowel sounds present  Extremities: lymphedema, pulses DP and PT palpable bilaterally  Neuro: Grossly nonfocal  Data Reviewed: Basic Metabolic Panel:  Recent Labs Lab 03/25/14 2110 03/26/14 0435  NA 139 141  K 3.6* 3.8  CL 102 104  CO2 23 24  GLUCOSE 212* 178*  BUN 11 11  CREATININE 0.94 1.01  1.00  CALCIUM 8.7 8.6  MG  --  1.8  PHOS  --  3.4    Liver Function Tests:  Recent Labs Lab 03/26/14 0435  AST 12  ALT 6  ALKPHOS 58  BILITOT 0.4  PROT 6.9  ALBUMIN 3.3*   No results found for this basename: LIPASE, AMYLASE,  in the last 168 hours No results found for this basename: AMMONIA,  in the last 168 hours CBC:  Recent Labs Lab 03/25/14 2110 03/26/14 0435 03/27/14 0525  WBC 10.6* 9.8 8.9  HGB 13.9 13.3 14.1  HCT 41.1 40.1 41.9  MCV 82.7 84.4 85.0  PLT 234 207 225   Cardiac Enzymes:  Recent Labs Lab 03/26/14 0435 03/26/14 1010 03/26/14 1634  TROPONINI <0.30 <0.30 <0.30   BNP: No components found with this basename: POCBNP,  CBG:  Recent Labs Lab 03/27/14 0737 03/27/14 1149 03/27/14 1706 03/27/14 2128 03/28/14 0735  GLUCAP 150* 129* 222* 276* 227*    No results found for this or any previous visit (from the past 240 hour(s)).   Studies: No results found.  Scheduled Meds: . amiodarone  200 mg Oral Daily  . amLODipine  10 mg Oral Daily  . buPROPion  300 mg Oral Daily  . divalproex  1,000 mg Oral QHS  . docusate sodium  100 mg Oral BID  . insulin aspart  0-5 Units Subcutaneous QHS  . insulin aspart  0-9 Units Subcutaneous TID WC  . insulin glargine  70 Units Subcutaneous QHS  . lamoTRIgine  100 mg Oral Daily  . levalbuterol  1.25 mg Nebulization TID  . methylPREDNISolone   60 mg Intravenous Q12H  . metoprolol succinate  50 mg Oral Daily  . Rivaroxaban  15 mg Oral BID WC

## 2014-03-28 NOTE — Progress Notes (Signed)
RT placed pt on CPAP 16cmH2O per home settings with 4lpm of oxygen bled in. Sterile water was added for humidification. Pt looks comfortable and is tolerating CPAP well at this time. RT will continue to monitor as needed.

## 2014-03-28 NOTE — Progress Notes (Signed)
Inpatient Diabetes Program Recommendations  AACE/ADA: New Consensus Statement on Inpatient Glycemic Control (2013)  Target Ranges:  Prepandial:   less than 140 mg/dL      Peak postprandial:   less than 180 mg/dL (1-2 hours)      Critically ill patients:  140 - 180 mg/dL   Reason for Assessment: Diabetes Coordinator Consult due to hyperglycemia  Diabetes history: Type 2 Outpatient Diabetes medications: 75/25 75 units at breakfast, 40 units at lunch, 65 at supper Current orders for Inpatient glycemic control: Lantus 70 units at HS, Sensitive Correction tid, HS correction  Results for [name], [name] (MRN [id number]) as of 03/28/2014 10:17  Ref. Range 03/27/2014 07:37 03/27/2014 11:49 03/27/2014 17:06 03/27/2014 21:28 03/28/2014 07:35  Glucose-Capillary Latest Range: 70-99 mg/dL 409150 (H) 811129 (H) 914222 (H) 276 (H) 227 (H)   Note:  Current regimen is providing about half of basal dose compared to home regimen and variable fast acting insulin based only on CBG's.  Solumedrol is presumably making patient more resistant to effects of  CHO intake and insulin.  Current diet order is for Heart Healthy with no CHO modification.  A1C is 6.8 indicating reasonable glycemic control prior to admission.  Would recommend the following:  If patient to stay on basal Lantus regimen, would consider changing to 40 units BID starting now (would be about 60% of home regimen).  Large dosages absorb better if split.  Add meal coverage of 10 units tid (about 70% of home prandial insulin)  Increase correction to resistant tid  Change diet order to CHO modified medium   Thank you.  Patti S. Elsie Lincolnouth, RN, CNS, CDE Inpatient Diabetes Program, team pager [phone]

## 2014-03-29 DIAGNOSIS — N178 Other acute kidney failure: Secondary | ICD-10-CM

## 2014-03-29 LAB — GLUCOSE, CAPILLARY
GLUCOSE-CAPILLARY: 307 mg/dL — AB (ref 70–99)
GLUCOSE-CAPILLARY: 232 mg/dL — AB (ref 70–99)
GLUCOSE-CAPILLARY: 221 mg/dL — AB (ref 70–99)
Glucose-Capillary: 244 mg/dL — ABNORMAL HIGH (ref 70–99)
Glucose-Capillary: 251 mg/dL — ABNORMAL HIGH (ref 70–99)

## 2014-03-29 MED ORDER — INSULIN GLARGINE 100 UNIT/ML ~~LOC~~ SOLN
40.0000 [IU] | Freq: Two times a day (BID) | SUBCUTANEOUS | Status: DC
  Administered 2014-03-29 – 2014-03-31 (×4): 40 [IU] via SUBCUTANEOUS
  Filled 2014-03-29 (×4): qty 0.4

## 2014-03-29 MED ORDER — INSULIN ASPART 100 UNIT/ML ~~LOC~~ SOLN
10.0000 [IU] | Freq: Three times a day (TID) | SUBCUTANEOUS | Status: DC
  Administered 2014-03-29 – 2014-03-31 (×5): 10 [IU] via SUBCUTANEOUS

## 2014-03-29 MED ORDER — POLYETHYLENE GLYCOL 3350 17 G PO PACK
17.0000 g | PACK | Freq: Every day | ORAL | Status: DC | PRN
  Administered 2014-03-29 – 2014-03-30 (×2): 17 g via ORAL
  Filled 2014-03-29 (×2): qty 1

## 2014-03-29 MED ORDER — SENNOSIDES-DOCUSATE SODIUM 8.6-50 MG PO TABS
2.0000 | ORAL_TABLET | Freq: Every day | ORAL | Status: DC
  Administered 2014-03-29 – 2014-03-30 (×2): 2 via ORAL
  Filled 2014-03-29 (×3): qty 2

## 2014-03-29 NOTE — Progress Notes (Signed)
Patient ID: Evan Marchaul J Tenaglia, male   DOB: 09/09/1971, 43 y.o.   MRN: [id number] [facility] PROGRESS NOTE  [name] [id number] DOB: 01/31/1971 DOA: 03/25/2014 PCP: Emeterio ReeveWOLTERS,SHARON A, MD  Brief narrative: 43 y.o. male with multiple medical comorbidities including but not limited to morbid obesity, OSA with hypoventilation on CPAP, atrial fibrillation on coumadin who [facility] with worsening shortness of breath, chest tightness and coughing for past few days prior to this admission. CXR showed no acute cardiopulmonary process.   Assessment/Plan:   Principal Problem:  Acute respiratory failure with hypoxia / OSA and OHS / Asthma exacerbation   Patient was more short of breath 6/26 and we started solumedrol 125 mg x once IV and then 60 mg IV Q 12 hours. Taper solumedrol to once a day regimen. Transition to PO prednisone likely in next 24 hours.  CPAP at bedtime  Unable to do perfusion scan for evaluation of possible PE secondary to weight. Please note that patient is on anticoagulation with Coumadin at home but has been noncompliant. Started xarelto. No evidence of bleeding. Active Problems:  DIABETES MELLITUS, TYPE II  A1c is 6.8 on this admission indicating god glycemic control  Per DM coordinator input changed insulin to 40 units BID and added novolog 10 units TID Atrial fibrillation  Patient is on anticoagulation with Coumadin at home however has been noncompliant. We have started xarelto on this admission.  Continue amiodarone. CHEST PAIN / Near syncope  Cardiac enzymes x 2 WNL  Chest pain resolved  Continue xarelto, aspirin not required due to increased risk of bleeding since he is already on xarelto  No acute findings on carotid doppler  Morbid obesity with body mass index of 60.0-69.9 in adult  Educated on diet  HTN (hypertension), benign  Continue Norvasc 10 mg daily, metoprolol 50 mg daily  Added hydralazine 10 mg PO Q 8 hours for better BP control. BP  this am 146/67 Depression / Anxiety  Continue Wellbutrin and xanax 1 mg every 8 hours PO PRN  DVT prophylaxis: on therapeutic anticoagulation with xarelto. He was given Coumadin and Lovenox on the admission and this was changed to xarelto on 03/27/2014   Code Status: full code  Family Communication: plan of care discussed with the patient  Disposition Plan: home when stable likely in am  Consultants:  None  Procedures:  Carotid doppler Antibiotics:  None    Manson PasseyEVINE, ALMA, MD  [facility] Pager [phone]  If 7PM-7AM, please contact night-coverage [email or web address] Password [email or web address] 03/29/2014, 1:37 PM   LOS: 4 days    HPI/Subjective: No acute overnight events. Feels short of breath.  Objective: Filed Vitals:   03/28/14 2110 03/28/14 2359 03/29/14 0517 03/29/14 0859  BP: 147/71  146/67   Pulse: 75 74 72   Temp: 97.7 F (36.5 C)  97.7 F (36.5 C)   TempSrc: Oral  Oral   Resp: 20 20 20    Height:      Weight:   150.141 kg (331 lb)   SpO2: 96% 96% 92% 93%    Intake/Output Summary (Last 24 hours) at 03/29/14 1337 Last data filed at 03/29/14 1113  Gross per 24 hour  Intake   1086 ml  Output    900 ml  Net    186 ml    Exam:   General:  Pt is sleeping, no distress  Cardiovascular: Regular rate and rhythm, S1/S2 (+)  Respiratory: diminished breath sounds, no wheezing   Abdomen: Soft, non tender,  non distended, bowel sounds present  Extremities:  Lymphedema bilateral LE appreciated, pulses DP and PT palpable bilaterally  Neuro: Grossly nonfocal  Data Reviewed: Basic Metabolic Panel:  Recent Labs Lab 03/25/14 2110 03/26/14 0435  NA 139 141  K 3.6* 3.8  CL 102 104  CO2 23 24  GLUCOSE 212* 178*  BUN 11 11  CREATININE 0.94 1.01  1.00  CALCIUM 8.7 8.6  MG  --  1.8  PHOS  --  3.4   Liver Function Tests:  Recent Labs Lab 03/26/14 0435  AST 12  ALT 6  ALKPHOS 58  BILITOT 0.4  PROT 6.9  ALBUMIN 3.3*   No results found for this basename:  LIPASE, AMYLASE,  in the last 168 hours No results found for this basename: AMMONIA,  in the last 168 hours CBC:  Recent Labs Lab 03/25/14 2110 03/26/14 0435 03/27/14 0525  WBC 10.6* 9.8 8.9  HGB 13.9 13.3 14.1  HCT 41.1 40.1 41.9  MCV 82.7 84.4 85.0  PLT 234 207 225   Cardiac Enzymes:  Recent Labs Lab 03/26/14 0435 03/26/14 1010 03/26/14 1634  TROPONINI <0.30 <0.30 <0.30   BNP: No components found with this basename: POCBNP,  CBG:  Recent Labs Lab 03/28/14 1210 03/28/14 1656 03/28/14 2105 03/29/14 0741 03/29/14 1208  GLUCAP 256* 352* 307* 232* 244*    No results found for this or any previous visit (from the past 240 hour(s)).   Studies: No results found.  Scheduled Meds: . amiodarone  200 mg Oral Daily  . amLODipine  10 mg Oral Daily  . buPROPion  300 mg Oral Daily  . divalproex  1,000 mg Oral QHS  . docusate sodium  100 mg Oral BID  . hydrALAZINE  10 mg Oral 3 times per day  . insulin aspart  0-5 Units Subcutaneous QHS  . insulin aspart  0-9 Units Subcutaneous TID WC  . insulin aspart  10 Units Subcutaneous TID WC  . insulin glargine  40 Units Subcutaneous BID  . lamoTRIgine  100 mg Oral Daily  . levalbuterol  1.25 mg Nebulization TID  . methylPREDNISolone (SOLU-MEDROL) injection  60 mg Intravenous Q12H  . metoprolol succinate  50 mg Oral Daily  . Rivaroxaban  15 mg Oral BID WC  . sodium chloride  3 mL Intravenous Q12H   Continuous Infusions:

## 2014-03-29 NOTE — Evaluation (Signed)
Physical Therapy Evaluation Patient Details Name: Maurice Marchaul J Rodwell MRN: [id number] DOB: 05/15/1971 Today's Date: 03/29/2014   History of Present Illness     Clinical Impression  Pt admitted with chest pain and presents with functional mobility limitations 2* morbid obesity and c/o mild SOB and intermittent mild dizziness with activity.  Pt should progress to return to prior living arrangement    Follow Up Recommendations No PT follow up    Equipment Recommendations  None recommended by PT    Recommendations for Other Services       Precautions / Restrictions Restrictions Weight Bearing Restrictions: No      Mobility  Bed Mobility Overal bed mobility:  (NT - pt states OOB on own this am)                Transfers Overall transfer level: Modified independent Equipment used: None                Ambulation/Gait Ambulation/Gait assistance: Min guard;Supervision Ambulation Distance (Feet): 150 Feet Assistive device: None Gait Pattern/deviations: WFL(Within Functional Limits);Shuffle;Wide base of support Gait velocity: mod   General Gait Details: Decreased knee flex with rocking gait.  Several episodes R drift and mild instability.  Pt reports mild SOB and intermittant mild dizziness but states same symptoms when sitting in room  Stairs            Wheelchair Mobility    Modified Rankin (Stroke Patients Only)       Balance                                             Pertinent Vitals/Pain No specific c/o pain.    Home Living Family/patient expects to be discharged to:: Private residence Living Arrangements: Other (Comment)   Type of Home: House Home Access: Stairs to enter Entrance Stairs-Rails: Right;Left;Can reach both Entrance Stairs-Number of Steps: 5 Home Layout: One level Home Equipment: Environmental consultantWalker - 2 wheels Additional Comments: Lives with 2 roomates    Prior Function Level of Independence: Independent                Hand Dominance        Extremity/Trunk Assessment   Upper Extremity Assessment: Overall WFL for tasks assessed           Lower Extremity Assessment: Overall WFL for tasks assessed         Communication   Communication: No difficulties  Cognition Arousal/Alertness: Awake/alert Behavior During Therapy: WFL for tasks assessed/performed Overall Cognitive Status: Within Functional Limits for tasks assessed                      General Comments      Exercises        Assessment/Plan    PT Assessment Patient needs continued PT services  PT Diagnosis Difficulty walking   PT Problem List Decreased activity tolerance;Decreased balance;Decreased mobility;Decreased knowledge of use of DME;Obesity  PT Treatment Interventions DME instruction;Gait training;Stair training;Functional mobility training;Therapeutic activities;Therapeutic exercise;Patient/family education   PT Goals (Current goals can be found in the Care Plan section) Acute Rehab PT Goals Patient Stated Goal: HOME PT Goal Formulation: With patient Time For Goal Achievement: 04/09/14 Potential to Achieve Goals: Good    Frequency Min 3X/week   Barriers to discharge        Co-evaluation  End of Session   Activity Tolerance: Patient limited by fatigue;Other (comment) (c/o mild SOB and mild intermittant dizziness) Patient left: in chair;with call bell/phone within reach Nurse Communication: Mobility status;Other (comment) (pt c/o mild SOB and intermittant dizziness)         Time: [id number] PT Time Calculation (min): 17 min   Charges:   PT Evaluation $Initial PT Evaluation Tier I: 1 Procedure PT Treatments $Gait Training: 8-22 mins   PT G Codes:          BRADSHAW,HUNTER 03/29/2014, 1:10 PM

## 2014-03-30 DIAGNOSIS — G4733 Obstructive sleep apnea (adult) (pediatric): Secondary | ICD-10-CM

## 2014-03-30 DIAGNOSIS — M7989 Other specified soft tissue disorders: Secondary | ICD-10-CM

## 2014-03-30 DIAGNOSIS — R55 Syncope and collapse: Secondary | ICD-10-CM

## 2014-03-30 DIAGNOSIS — N179 Acute kidney failure, unspecified: Secondary | ICD-10-CM

## 2014-03-30 DIAGNOSIS — J9620 Acute and chronic respiratory failure, unspecified whether with hypoxia or hypercapnia: Principal | ICD-10-CM

## 2014-03-30 DIAGNOSIS — M79609 Pain in unspecified limb: Secondary | ICD-10-CM

## 2014-03-30 LAB — GLUCOSE, CAPILLARY
GLUCOSE-CAPILLARY: 217 mg/dL — AB (ref 70–99)
GLUCOSE-CAPILLARY: 225 mg/dL — AB (ref 70–99)
Glucose-Capillary: 252 mg/dL — ABNORMAL HIGH (ref 70–99)
Glucose-Capillary: 272 mg/dL — ABNORMAL HIGH (ref 70–99)

## 2014-03-30 LAB — CBC
HCT: 40.2 % (ref 39.0–52.0)
HEMOGLOBIN: 13.8 g/dL (ref 13.0–17.0)
MCH: 28.2 pg (ref 26.0–34.0)
MCHC: 34.3 g/dL (ref 30.0–36.0)
MCV: 82 fL (ref 78.0–100.0)
Platelets: 214 10*3/uL (ref 150–400)
RBC: 4.9 MIL/uL (ref 4.22–5.81)
RDW: 15.1 % (ref 11.5–15.5)
WBC: 13.9 10*3/uL — ABNORMAL HIGH (ref 4.0–10.5)

## 2014-03-30 LAB — CREATININE, SERUM
Creatinine, Ser: 0.85 mg/dL (ref 0.50–1.35)
GFR calc Af Amer: >90 mL/min (ref >90)
GFR calc non Af Amer: >90 mL/min (ref >90)

## 2014-03-30 MED ORDER — FUROSEMIDE 10 MG/ML IJ SOLN
40.0000 mg | Freq: Two times a day (BID) | INTRAMUSCULAR | Status: AC
  Administered 2014-03-30 – 2014-03-31 (×2): 40 mg via INTRAVENOUS
  Filled 2014-03-30 (×2): qty 4

## 2014-03-30 MED ORDER — HYDRALAZINE HCL 10 MG PO TABS: 10.0000 mg | ORAL_TABLET | Freq: Three times a day (TID) | ORAL | Status: DC

## 2014-03-30 MED ORDER — RIVAROXABAN 15 MG PO TABS: 15.0000 mg | ORAL_TABLET | Freq: Two times a day (BID) | ORAL | Status: DC

## 2014-03-30 MED ORDER — LEVALBUTEROL HCL 1.25 MG/0.5ML IN NEBU: 1.2500 mg | INHALATION_SOLUTION | Freq: Four times a day (QID) | RESPIRATORY_TRACT | Status: DC | PRN

## 2014-03-30 MED ORDER — HYDROCODONE-ACETAMINOPHEN 5-325 MG PO TABS: 1.0000 | ORAL_TABLET | ORAL | Status: DC | PRN

## 2014-03-30 MED ORDER — METHYLPREDNISOLONE SODIUM SUCC 40 MG IJ SOLR
40.0000 mg | INTRAMUSCULAR | Status: DC
  Administered 2014-03-31: 40 mg via INTRAVENOUS
  Filled 2014-03-30 (×2): qty 1

## 2014-03-30 MED ORDER — DSS 100 MG PO CAPS: 100.0000 mg | ORAL_CAPSULE | Freq: Two times a day (BID) | ORAL | Status: DC | PRN

## 2014-03-30 MED ORDER — ALBUTEROL SULFATE HFA 108 (90 BASE) MCG/ACT IN AERS: 2.0000 | INHALATION_SPRAY | Freq: Four times a day (QID) | RESPIRATORY_TRACT | Status: AC | PRN

## 2014-03-30 MED ORDER — ALPRAZOLAM 1 MG PO TABS: 1.0000 mg | ORAL_TABLET | Freq: Three times a day (TID) | ORAL | Status: DC | PRN

## 2014-03-30 MED ORDER — NAPHAZOLINE HCL 0.1 % OP SOLN
1.0000 [drp] | Freq: Four times a day (QID) | OPHTHALMIC | Status: DC | PRN
  Administered 2014-03-30: 1 [drp] via OPHTHALMIC
  Filled 2014-03-30: qty 15

## 2014-03-30 MED ORDER — POTASSIUM CHLORIDE CRYS ER 20 MEQ PO TBCR
40.0000 meq | EXTENDED_RELEASE_TABLET | Freq: Once | ORAL | Status: AC
  Administered 2014-03-30: 40 meq via ORAL
  Filled 2014-03-30: qty 2

## 2014-03-30 NOTE — Progress Notes (Signed)
Name: AH BOTT MRN: [id number] DOB: [birth year]    ADMISSION DATE:  03/25/2014 CONSULTATION DATE:  03/30/14  REFERRING MD :  Dr. Elisabeth Pigeon  PRIMARY SERVICE:  TRH  CHIEF COMPLAINT:  Shortness of breath  BRIEF PATIENT DESCRIPTION: 43 y/o M with PMH of Afib (non-compliant with coumadin), Morbid Obesity (BMI 72), Chronic Pain, DM, Depression, OSA (not on CPAP) who was admitted on 6/25 with 1 wk hx of chest pain and syncopal episode.  PCCM consulted for evaluation of ongoing dyspnea.    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 D-Dimer 0.73  STUDIES:  6/25 - Carotid Doppler >> vertebral arteries patent, poor image quality due to habitus.  Mild stenosis of R/L ICA 6/25 - ECHO >> EF 55-60%, ? If bicuspid AV - mild stenosis, mild aortic root dilation   HISTORY OF PRESENT ILLNESS:  43 y/o M with PMH of Afib (non-compliant with coumadin), Morbid Obesity (BMI 72), Chronic Pain, DM, Depression, OSA (not on CPAP or oxygen) who was admitted on 6/25 with 1 wk hx of chest pain and syncopal episode.  Patient reports he began experiencing sharp / burning mid-sternal chest pain 5 days PTA that would occasionally radiate to the back or R arm.  On day of admission, he was at church and felt he was going to pass out and woke up in his chair.  Day prior to admit, he had toured [employer] walking the entire campus in the heat.  He now reports exertional chest pain & shortness of breath with minimal activity. Patient denies n/v/d, fevers, chills, headaches.  He has noticed lower ext swelling L>R.  At baseline, he is able to walk 2 blocks in 30 minutes with rest breaks.  He is not compliant with CPAP or oxygen.  He was supposed to  be on O2 but quit wearing after a move about 1 year ago.  He also quit taking his coumadin in 08/2013 due to difficulty getting to the MD office.  He has been working on weight loss with a nutritionist and has lost approx 80lbs in the last year.     PAST MEDICAL HISTORY :  Past Medical History  Diagnosis Date  . CHF (congestive heart failure)   . HTN (hypertension)   . Ptosis   . Atrial flutter   . Atrial fibrillation   . Morbid obesity   . DM2 (diabetes mellitus, type 2)   . Depression   . Respiratory failure   . Chronic low back pain   . Diabetes mellitus   . Asthma   . Vision abnormalities   . Abscess     right thigh   Past Surgical History  Procedure Laterality Date  . Tonsillectomy    . Cholecystectomy    . Tonsillectomy    . Irrigation and debridement abscess  10/13/2011    Procedure: IRRIGATION AND DEBRIDEMENT ABSCESS;  Surgeon: Emelia Loron, MD;  Location: [facility] OR;  Service: General;  Laterality: Right;  Irrigation and debridement right thigh abscess  . Application of wound vac      right thigh abscess   Prior to Admission medications   Medication Sig Start Date End Date Taking? Authorizing  Provider  albuterol (VENTOLIN HFA) 108 (90 BASE) MCG/ACT inhaler Inhale 2 puffs into the lungs every 6 (six) hours as needed. Shortness of breath   Yes Historical Provider, MD  amiodarone (PACERONE) 200 MG tablet Take 200 mg by mouth daily.   Yes Historical Provider, MD  amLODipine (NORVASC) 10 MG tablet Take 10 mg by mouth daily.  11/13/11  Yes Historical Provider, MD  buPROPion (WELLBUTRIN XL) 300 MG 24 hr tablet Take 300 mg by mouth Daily.  03/13/12  Yes Historical Provider, MD  cyclobenzaprine (FLEXERIL) 10 MG tablet Take 10 mg by mouth 3 (three) times daily as needed for muscle spasms.   Yes Historical Provider, MD  divalproex (DEPAKOTE) 500 MG DR tablet Take 1,000 mg by mouth at bedtime.    Yes Historical Provider, MD  HUMALOG MIX 75/25 (75-25) 100 UNIT/ML SUSP Inject 40-75  Units into the skin Three times a day before meals. Takes 75 units before breakfast Takes 40 units before lunch Takes 65 units before supper 03/13/12  Yes Historical Provider, MD  lamoTRIgine (LAMICTAL) 100 MG tablet Take 100 mg by mouth daily.  10/23/11  Yes Historical Provider, MD  metoprolol (TOPROL-XL) 50 MG 24 hr tablet Take 50 mg by mouth daily. 05/10/11  Yes Lewayne BuntingBrian S Crenshaw, MD  nitroGLYCERIN (NITROSTAT) 0.4 MG SL tablet Place 0.4 mg under the tongue once.   Yes Historical Provider, MD  traMADol (ULTRAM) 50 MG tablet Take 50-100 mg by mouth every 6 (six) hours as needed for pain.   Yes Historical Provider, MD   Allergies  Allergen Reactions  . Iodine Anaphylaxis and Other (See Comments)    Associated with the shellfish allergy  . Shellfish Allergy Anaphylaxis    FAMILY HISTORY:  Family History  Problem Relation Age of Onset  . Coronary artery disease Mother     1740s; father had it in 6530s; both deceased in their 7170s    . Cancer Mother     liver  . Heart disease Father   . Cancer Father     prostate   SOCIAL HISTORY:  reports that he has never smoked. He does not have any smokeless tobacco history on file. He reports that he drinks alcohol. He reports that he does not use illicit drugs.  REVIEW OF SYSTEMS:   Constitutional: Negative for fever, chills, weight loss, malaise/fatigue and diaphoresis.  HENT: Negative for hearing loss, ear pain, nosebleeds, congestion, sore throat, neck pain, tinnitus and ear discharge.   Eyes: Negative for blurred vision, double vision, photophobia, pain, discharge and redness.  Respiratory: Negative for cough, hemoptysis, sputum production, wheezing and stridor.  Reports shortness of breath.  Cardiovascular: Negative for palpitations, claudication, and PND. Reports 3-4 pillow orthopnea, usually sleeps on stomach, L>R LE swelling.  Gastrointestinal: Negative for heartburn, nausea, vomiting, abdominal pain, diarrhea, constipation, blood in stool and  melena.  Genitourinary: Negative for dysuria, urgency, frequency, hematuria and flank pain.  Musculoskeletal: Negative for myalgias, back pain, joint pain and falls.  Skin: Negative for itching and rash.  Neurological: Negative for dizziness, tingling, tremors, sensory change, speech change, focal weakness, seizures, loss of consciousness, weakness and headaches.  Endo/Heme/Allergies: Negative for environmental allergies and polydipsia. Does not bruise/bleed easily.  SUBJECTIVE:   VITAL SIGNS: Temp:  [97.8 F (36.6 C)-98.6 F (37 C)] 98 F (36.7 C) (06/29 0300) Pulse Rate:  [70-84] 81 (06/29 0917) Resp:  [16-18] 16 (06/29 0300) BP: (125-168)/(65-98) 168/84 mmHg (06/29 0917) SpO2:  [95 %-98 %] 95 % (06/29 0851) Weight:  [  580 lb (263.086 kg)] 580 lb (263.086 kg) (06/29 0300)  PHYSICAL EXAMINATION: General:  Morbidly obese male in NAD  Neuro:  AAOx4, speech clear, MAE HEENT:  Mm pink/moist, short thick neck Cardiovascular:  s1s2 rrr distant tones due to habitus Lungs:  resp's even/non-labored, lungs bilaterally clear  Abdomen:  Obese, soft, bsx4 active, no pitting edema Musculoskeletal:  No acute deformities  Skin:  Warm/dry, trach LE edema    Recent Labs Lab 03/25/14 2110 03/26/14 0435 03/30/14 0500  NA 139 141  --   K 3.6* 3.8  --   CL 102 104  --   CO2 23 24  --   BUN 11 11  --   CREATININE 0.94 1.01  1.00 0.85  GLUCOSE 212* 178*  --     Recent Labs Lab 03/26/14 0435 03/27/14 0525 03/30/14 0500  HGB 13.3 14.1 13.8  HCT 40.1 41.9 40.2  WBC 9.8 8.9 13.9*  PLT 207 225 214   No results found.  ASSESSMENT / PLAN:  Dyspnea - multifactorial in the setting of morbid obesity, OSA, chronic hypoxia, ? PAH, +/- PE Severe obstructive sleep apnea / hypopnea syndrome - last sleep study 08/2009 with 66 events per hour, and desaturations on 2L to 72%.   Chronic hypoxic respiratory failure Morbid Obesity  Treat as Presumed PE - unable to assess CT due to  habitus  Plan: -Lasix 40 mg BID + KCL x 2 doses -O2 at 3L per [place] > assessed with PT, remained 92% on RA with ambulation  -continue xarelto  -CPAP QHS + PRN sleep -assess LE doppler for mobile clot -ensure compliance with CPAP  Syncope / Chest Pain - continues to report exertional chest pain.   Atrial Fibrillation  HTN ? Bicuspid AV with Aortic Root Dilation  - noted on ECHO  Plan: -Recommend cardiology input regarding chest pain, Bicuspid AV?    Canary BrimBrandi Ollis, NP-C [facility] Pgr: [phone] or [phone]  Attending:  I have seen and examined the patient with nurse practitioner/resident and agree with the note above.   It is difficult to evaluate his volume status.  I think that cardiology's input would be valuable regarding his chest pain.  Would consider LHC vs RHC.  They know him, so if they feel this could be performed as an outpatient that seems reasonable to me.    From a pulmonary perspective continuing Xarelto, continuing the oxygen and CPAP is appropriate.  Heber CarolinaBrent McQuaid, MD [facility] Pager: [phone] Cell: [phone] If no response, call [phone]    03/30/2014, 11:06 AM

## 2014-03-30 NOTE — Progress Notes (Signed)
Patient ID: [name], male   DOB: 04/20/1971, 43 y.o.   MRN: [id number] [facility] PROGRESS NOTE  [name] Livers [id number] DOB: 04/17/1971 DOA: 03/25/2014 PCP: Emeterio ReeveWOLTERS,SHARON A, MD  Brief narrative: 43 y.o. male with multiple medical comorbidities including but not limited to morbid obesity, OSA with hypoventilation on CPAP, atrial fibrillation on coumadin who [facility] with worsening shortness of breath, chest tightness and coughing for past few days prior to this admission. CXR showed no acute cardiopulmonary process.   Assessment/Plan:   Principal Problem:  Acute respiratory failure with hypoxia / OSA and OHS / Asthma exacerbation  Patient was more short of breath 6/26 and we started solumedrol 125 mg x once IV and then 60 mg IV Q 12 hours. Tapered down to once a day solumedrol IV. Pulmonary consulted since pt still feels short of breath. CPAP at bedtime  Unable to do perfusion scan for evaluation of possible PE secondary to weight. Please note that patient is on anticoagulation with Coumadin at home but has been noncompliant. Started xarelto. No evidence of bleeding. Active Problems:  DIABETES MELLITUS, TYPE II  A1c is 6.8 on this admission indicating god glycemic control  Per DM coordinator input changed insulin to 40 units BID and added novolog 10 units TID A1c is actually at goal on this admission so it is reasonable that pt continues the previous insulin home regimen on discharge Atrial fibrillation  Patient is on anticoagulation with Coumadin at home however has been noncompliant. We have started xarelto on this admission.  Continue amiodarone. CHEST PAIN / Near syncope  Cardiac enzymes x 2 WNL. Still has some chest tightness. His BNP was WNL on this admission. Continue xarelto, aspirin not required due to increased risk of bleeding since he is already on xarelto  No acute findings on carotid doppler  Morbid obesity with body mass index of 60.0-69.9  in adult  Educated on diet  HTN (hypertension), benign  Continue Norvasc 10 mg daily, metoprolol 50 mg daily  Added hydralazine 10 mg PO Q 8 hours for better BP control.  Depression / Anxiety  Continue Wellbutrin and xanax 1 mg every 8 hours PO PRN   DVT prophylaxis: on therapeutic anticoagulation with xarelto. He was given Coumadin and Lovenox on the admission and this was changed to xarelto on 03/27/2014    Code Status: full code  Family Communication: plan of care discussed with the patient  Disposition Plan: home when stable likely in am   Consultants:  Pulmonary  Procedures:  Carotid doppler Antibiotics:  None    Evan PasseyEVINE, ALMA, MD  [facility] Pager [phone]  If 7PM-7AM, please contact night-coverage [email or web address] Password [email or web address] 03/30/2014, 5:46 PM   LOS: 5 days    HPI/Subjective: No acute overnight events.  Objective: Filed Vitals:   03/30/14 0508 03/30/14 0851 03/30/14 0917 03/30/14 1332  BP:   168/84 162/89  Pulse:   81 79  Temp:    98 F (36.7 C)  TempSrc:      Resp:      Height:      Weight:      SpO2: 95% 95%  91%    Intake/Output Summary (Last 24 hours) at 03/30/14 1746 Last data filed at 03/30/14 1334  Gross per 24 hour  Intake   1680 ml  Output      0 ml  Net   1680 ml    Exam:   General:  Pt is alert, follows commands appropriately, not  in acute distress  Cardiovascular: Regular rate and rhythm, S1/S2, no murmurs  Respiratory: diminished breath sounds throughout, no wheezing   Abdomen: Soft, non tender, non distended, bowel sounds present  Extremities: Lymphedema, pulses DP and PT palpable bilaterally  Neuro: Grossly nonfocal  Data Reviewed: Basic Metabolic Panel:  Recent Labs Lab 03/25/14 2110 03/26/14 0435 03/30/14 0500  NA 139 141  --   K 3.6* 3.8  --   CL 102 104  --   CO2 23 24  --   GLUCOSE 212* 178*  --   BUN 11 11  --   CREATININE 0.94 1.01  1.00 0.85  CALCIUM 8.7 8.6  --   MG  --  1.8  --   PHOS   --  3.4  --    Liver Function Tests:  Recent Labs Lab 03/26/14 0435  AST 12  ALT 6  ALKPHOS 58  BILITOT 0.4  PROT 6.9  ALBUMIN 3.3*   No results found for this basename: LIPASE, AMYLASE,  in the last 168 hours No results found for this basename: AMMONIA,  in the last 168 hours CBC:  Recent Labs Lab 03/25/14 2110 03/26/14 0435 03/27/14 0525 03/30/14 0500  WBC 10.6* 9.8 8.9 13.9*  HGB 13.9 13.3 14.1 13.8  HCT 41.1 40.1 41.9 40.2  MCV 82.7 84.4 85.0 82.0  PLT 234 207 225 214   Cardiac Enzymes:  Recent Labs Lab 03/26/14 0435 03/26/14 1010 03/26/14 1634  TROPONINI <0.30 <0.30 <0.30   BNP: No components found with this basename: POCBNP,  CBG:  Recent Labs Lab 03/29/14 1704 03/29/14 2112 03/30/14 0737 03/30/14 1201 03/30/14 1655  GLUCAP 251* 221* 217* 225* 252*    No results found for this or any previous visit (from the past 240 hour(s)).   Studies: No results found.  Scheduled Meds: . amiodarone  200 mg Oral Daily  . amLODipine  10 mg Oral Daily  . buPROPion  300 mg Oral Daily  . divalproex  1,000 mg Oral QHS  . docusate sodium  100 mg Oral BID  . furosemide  40 mg Intravenous Q12H  . hydrALAZINE  10 mg Oral 3 times per day  . insulin aspart  0-5 Units Subcutaneous QHS  . insulin aspart  0-9 Units Subcutaneous TID WC  . insulin aspart  10 Units Subcutaneous TID WC  . insulin glargine  40 Units Subcutaneous BID  . lamoTRIgine  100 mg Oral Daily  . levalbuterol  1.25 mg Nebulization TID  .  methylPREDNISolone   40 mg Intravenous Q24H  . metoprolol succinate  50 mg Oral Daily  . potassium chloride  40 mEq Oral Once  . Rivaroxaban  15 mg Oral BID WC  . senna-docusate  2 tablet Oral QHS

## 2014-03-30 NOTE — Progress Notes (Signed)
VASCULAR LAB PRELIMINARY  PRELIMINARY  PRELIMINARY  PRELIMINARY  Bilateral lower extremity venous duplex completed.    Preliminary report:  No obvious evidence of deep or superficial thrombus noted in the veins visualized. Technically limited due to morbid obesity. Since all areas of the veins could not be imaged this exam may be somewhat inconclusive.  SLAUGHTER, [place], RVS 03/30/2014, 4:19 PM

## 2014-03-30 NOTE — Consult Note (Signed)
CARDIOLOGY CONSULT NOTE   Patient ID: [name] MRN: [id number], DOB/AGE: 44-16-1972   Admit date: 03/25/2014 Date of Consult: 03/30/2014   Primary Physician: Emeterio Reeve, MD Primary Cardiologist: Dr. Jens Som  Pt. Profile  morbidly obese 43 year old Caucasian male with past medical history significant for hypertension, history of chronic diastolic heart failure, history of atrial fibrillation/aflutter, diabetes and obstructive sleep apnea noncompliant with CPAP presented to [facility] with sharp chest pain that has been persistent for 1 wk  Problem List  Past Medical History  Diagnosis Date  . CHF (congestive heart failure)   . HTN (hypertension)   . Ptosis   . Atrial flutter   . Atrial fibrillation   . Morbid obesity   . DM2 (diabetes mellitus, type 2)   . Depression   . Respiratory failure   . Chronic low back pain   . Diabetes mellitus   . Asthma   . Vision abnormalities   . Abscess     right thigh    Past Surgical History  Procedure Laterality Date  . Tonsillectomy    . Cholecystectomy    . Tonsillectomy    . Irrigation and debridement abscess  10/13/2011    Procedure: IRRIGATION AND DEBRIDEMENT ABSCESS;  Surgeon: Emelia Loron, MD;  Location: [facility] OR;  Service: General;  Laterality: Right;  Irrigation and debridement right thigh abscess  . Application of wound vac      right thigh abscess     Allergies  Allergies  Allergen Reactions  . Iodine Anaphylaxis and Other (See Comments)    Associated with the shellfish allergy  . Shellfish Allergy Anaphylaxis    HPI   The patient is a morbidly obese 43 year old Caucasian male with past medical history significant for hypertension, history of chronic diastolic heart failure, history of atrial fibrillation/aflutter, diabetes and obstructive sleep apnea noncompliant with CPAP. Of note, patient had multiple history of atypical chest discomfort. He has a history of atrial fibrillation and atrial flutter under went  successful TEE cardioversion in Oct 2010. He was considered for NOAC before, however due to financial reasons, he had to switch to Coumadin. His last dose of Coumadin was last year and he did not discuss with cardiology further after being rejected by his insurance company for NOAC. He has been followed up by Dr. Jens Som in the clinic, his last followup was on 06/19/2013 at which time he was doing well without complaint of any chest pain. According to the patient, he denies any recent fever, chill, cough, he does endorse recent worsening of lower extremity edema, 3-4 pillow orthopnea or paroxysmal nocturnal dyspnea. He states he has been having progressive worsening dyspnea on exertion since last year. Surprisingly, he lost about 60 pounds since last year despite worsening SOB. He states he has not been compliant with CPAP and he has been going through a period of depression when he lost his apartment and has to move in with family member.  Patient states he was recently enrolled in school, and spent the majority of Saturday 03/20/2014 walking around the campus as part of the new student orientation. He states he would experience chest pain after walking 2 steps and had to stop and rest. He described the chest pain is sharp substernal pain radiating to the right side worse with exertion, lifting of R arm, rotation of the upper body, deep inspiration and chest palpation. On Sunday, patient states he woke up tired after walking around the campus on hot summer day on Saturday. He  eventually went to the church. While at the church, patient felt severely dizzy and had to sit down into the chair. He also describes some visual changes such as dark vision for several seconds. Patient also had significant chest pain which persisted to this day. [facility] work[facility]. Troponin on arrival was negative. However d-dimer was elevated at 0.73, where as it was negative 10 month ago. He was admitted by  medicine service. Serial troponins were obtained which were negative. However patient continued to have severe dyspnea and persistent chest pain. He states his chest pain never went away since admission and would be worse with deep inspiration, palpitation, body rotation, or exertion. 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 He was previously consider for pulmonary perfusion scan, however due to his significant body habitus, this could not be done. Echocardiogram was obtained on 03/26/2014 which showed EF 55-60%, mildly dilated left ventricle, moderate LVH, bicuspid aortic valve morphology cannot be excluded. Pulmonary critical care was consulted in the morning of 03/30/2014 for persistent dyspnea who recommended lower extremity ultrasound and cardiac evaluation.  Inpatient Medications  . amiodarone  200 mg Oral Daily  . amLODipine  10 mg Oral Daily  . buPROPion  300 mg Oral Daily  . divalproex  1,000 mg Oral QHS  . docusate sodium  100 mg Oral BID  . furosemide  40 mg Intravenous Q12H  . hydrALAZINE  10 mg Oral 3 times per day  . insulin aspart  0-5 Units Subcutaneous QHS  . insulin aspart  0-9 Units Subcutaneous TID WC  . insulin aspart  10 Units Subcutaneous TID WC  . insulin glargine  40 Units Subcutaneous BID  . lamoTRIgine  100 mg Oral Daily  . levalbuterol  1.25 mg Nebulization TID  . [START ON 03/31/2014] methylPREDNISolone (SOLU-MEDROL) injection  40 mg Intravenous Q24H  . metoprolol succinate  50 mg Oral Daily  . potassium chloride  40 mEq Oral Once  . Rivaroxaban  15 mg Oral BID WC  . senna-docusate  2 tablet Oral QHS  . sodium chloride  3 mL Intravenous Q12H    Family History Family History  Problem Relation Age of Onset  . Coronary artery disease Mother     2840s; father had it in 7630s; both deceased in their 570s    . Cancer Mother     liver  . Heart disease Father   . Cancer Father     prostate     Social  History History   Social History  . Marital Status: Divorced    Spouse Name: N/A    Number of Children: N/A  . Years of Education: N/A   Occupational History  . unemployeed    Social History Main Topics  . Smoking status: Never Smoker   . Smokeless tobacco: Not on file     Comment: no significant tobacco use   . Alcohol Use: Yes     Comment: no significant use-- rare  . Drug Use: No  . Sexual Activity: Not on file   Other Topics Concern  . Not on file   Social History Narrative   [place]ives in [place]; works full time at [employer].    Occasionally takes echinacea and B12 supplements.    Regular diet; has not exercised regularly over the last 3 months.      Review of Systems  General:  No chills, fever, night sweats or weight changes.  Cardiovascular:  + chest pain, dyspnea on exertion, edema, orthopnea, palpitations, paroxysmal nocturnal dyspnea. Dermatological: No rash, lesions/masses Respiratory: No cough, dyspnea Urologic: No hematuria, dysuria Abdominal:   No nausea, vomiting, diarrhea, bright red blood per rectum, melena, or hematemesis Neurologic:  No changes in mental status. All other systems r+visual changes and dizziness eviewed and are otherwise negative except as noted above.  Physical Exam  Blood pressure 162/89, pulse 79, temperature 98 F (36.7 C), temperature source Oral, resp. rate 16, height 6' 3.2" (1.91 m), weight 580 lb (263.086 kg), SpO2 91.00%.  General: Pleasant, NAD. Morbidly obese Psych: Normal affect. Neuro: Alert and oriented X 3. Moves all extremities spontaneously. HEENT: Normal  Neck: Supple without bruits. Unable to assess JVD due to excessive tissue around neck Lungs:  Resp regular and unlabored, CTA. Heart: RRR no s3, s4, or murmurs. Abdomen: Soft, non-tender, non-distended, BS + x 4.  Extremities: No clubbing, cyanosis or edema. DP/PT/Radials 2+ and equal bilaterally.  Labs  No results found for this basename: CKTOTAL, CKMB,  TROPONINI,  in the last 72 hours Lab Results  Component Value Date   WBC 13.9* 03/30/2014   HGB 13.8 03/30/2014   HCT 40.2 03/30/2014   MCV 82.0 03/30/2014   PLT 214 03/30/2014    Recent Labs Lab 03/26/14 0435 03/30/14 0500  NA 141  --   K 3.8  --   CL 104  --   CO2 24  --   BUN 11  --   CREATININE 1.01  1.00 0.85  CALCIUM 8.6  --   PROT 6.9  --   BILITOT 0.4  --   ALKPHOS 58  --   ALT 6  --   AST 12  --   GLUCOSE 178*  --    Lab Results  Component Value Date   CHOL 149 03/26/2014   HDL 34* 03/26/2014   LDLCALC 82 03/26/2014   TRIG 167* 03/26/2014   Lab Results  Component Value Date   DDIMER 0.73* 03/25/2014    Radiology/Studies  Dg Chest Port 1 View  03/25/2014   CLINICAL DATA:  Chest pain.  EXAM: PORTABLE CHEST - 1 VIEW  COMPARISON:  05/09/2013  FINDINGS: The heart is enlarged but stable. The lungs are clear. No pleural effusion. No pneumothorax. The bony thorax is grossly normal.  IMPRESSION: No acute cardiopulmonary findings.   Electronically Signed   By: Loralie Champagne M.D.   On: 03/25/2014 21:15    ECG  NSR with HR 70s, no significant ST-T wave changes  ASSESSMENT AND PLAN  1. Atypical chest pain  - elevated d-dimer, pending LE U/S  - would r/o PE first given negative serial enzyme, negative EKG changes, and persistent CP for 1 wk  - cardiac risk factor morbid obesity, HTN, DM, OSA, noncompliance, mother had MI at age 52  - some typical and atypical feature with his symptoms, ?if able to fit in stress test, however anaphylactic reaction to iodine is also problematic, unclear if he would have similar reaction to contrast dye during cath.  2. Syncope: unclear cause, may be related to dehydration vs arrthythmia vs hypotension  3. Possible bicuspid aortic valve with mild aortic dilatation on echo  4. Acute on chronic respiratory failure  - progressively worsened since last years, likely related to body habitatus, suprisingly his symptom is worse after 60 lbs  weight loss   5. DM 6. Morbid obesity with BM >60 7. H/o a-fib, off coumadin since last year after rejected by  his insurance for NOAC  - continue amiodarone, no CXR finding to suggest amio toxicity   - restarted on Xarelto, may need assistance program if decide on Xarelto for long term 8. HTN 9. Depression 10. OSA, noncompliant with CPAP  Signed, Azalee CourseMeng, Hao, PA-C 03/30/2014, 4:05 PM  Patient seen and examined. Agree with assessment and plan. Very pleasant gentleman with super morbid obesity and probable hypoventilation-obesity syndrome. Chest pain seems noncardiac with chest wall discomfort and pain with movement of R arm and deep inspiration. No effusion noted on echo; EF 55 - 60% without wall motion abnormalities.  He was started on steroids this am; therefore, will not start on NSAID rx. Pt is now on xarelto and is maintaining NSR with h/o PAF s/p remote cardioversion in 2010. Agree with  diuresis. Unable to do diagnostic cardiac studies due to weight limitation. Pt has lost from 630 to 558 over the last 2 years. ECG is normal. Consider potential candidacy for bariatric surgery. Will follow.   Lennette Biharihomas A. Kelly, MD, [facility] Services[facility]ACC 03/30/2014 6:24 PM

## 2014-03-30 NOTE — Progress Notes (Signed)
Pt refused CPAP for tonight. Pt is currently on a 2Lpm nasal cannula. Pt was made aware if he should change his mind throughout the night to contact RT. RN notified.

## 2014-03-30 NOTE — Progress Notes (Signed)
Physical Therapy Treatment Patient Details Name: [name] MRN: [id number] DOB: 02/07/1971 Today's Date: 03/30/2014    History of Present Illness admitted for shorntess of breath    PT Comments    Pt OOB in recliner.  Assisted with amb while monitoring RA sats.    Follow Up Recommendations  No PT follow up     Equipment Recommendations       Recommendations for Other Services       Precautions / Restrictions Restrictions Weight Bearing Restrictions: No    Mobility  Bed Mobility               General bed mobility comments: Pt OOB in recliner  Transfers Overall transfer level: Modified independent Equipment used: None             General transfer comment: good safety cognition and use of hands  Ambulation/Gait Ambulation/Gait assistance: Supervision Ambulation Distance (Feet): 160 Feet Assistive device: None Gait Pattern/deviations: Wide base of support;Shuffle     General Gait Details: amb on RA sats avg 92% with avg HR 88.  3 standing rest breaks.  Noted mild dyspnea.  BP 169/98(113)   Stairs            Wheelchair Mobility    Modified Rankin (Stroke Patients Only)       Balance                                    Cognition                            Exercises      General Comments        Pertinent Vitals/Pain SATURATION QUALIFICATIONS: (This note is used to comply with regulatory documentation for home oxygen)  Patient Saturations on Room Air at Rest = 94%  Patient Saturations on Room Air while Ambulating =91%   Please briefly explain why patient needs home oxygen:   RA sats maintained above 90% - no supplemental oxygen needed      Home Living                      Prior Function            PT Goals (current goals can now be found in the care plan section) Progress towards PT goals: Progressing toward goals    Frequency  Min 3X/week    PT Plan      Co-evaluation              End of Session   Activity Tolerance: Patient tolerated treatment well       Time: [id number] PT Time Calculation (min): 26 min  Charges:  $Therapeutic Activity: 8-22 mins                    G Codes:      Felecia ShellingLori Kropski  PTA [facility]  Acute  Rehab Pager      [phone]

## 2014-03-30 NOTE — Progress Notes (Signed)
Inpatient Diabetes Program Recommendations  AACE/ADA: New Consensus Statement on Inpatient Glycemic Control (2013)  Target Ranges:  Prepandial:   less than 140 mg/dL      Peak postprandial:   less than 180 mg/dL (1-2 hours)      Critically ill patients:  140 - 180 mg/dL   Reason for Visit: Hyperglycemia  Results for [name], [name] (MRN [id number]) as of 03/30/2014 17:01  Ref. Range 03/29/2014 12:08 03/29/2014 17:04 03/29/2014 21:12 03/30/2014 07:37 03/30/2014 12:01  Glucose-Capillary Latest Range: 70-99 mg/dL 213244 (H) 086251 (H) 578221 (H) 217 (H) 225 (H)   On Solumedrol 40 Q24H. Steroid-induced hyperglycemia. Eating 100% diet.  Recommendations: Increase Lantus to 45 units bid Increase Novolog to 12 units tidwc.  Will continue to follow. Thank you. Ailene Ardshonda Cyndia Degraff, RD, LDN, CDE Inpatient Diabetes Coordinator [phone]

## 2014-03-30 NOTE — Progress Notes (Signed)
ANTICOAGULATION CONSULT NOTE - Follow Up   Pharmacy Consult for Xarelto Indication: atrial fibrillation and pulmonary embolus  Allergies  Allergen Reactions  . Iodine Anaphylaxis and Other (See Comments)    Associated with the shellfish allergy  . Shellfish Allergy Anaphylaxis    Patient Measurements: Height: 6' 3.2" (191 cm) Weight: 580 lb (263.086 kg) IBW/kg (Calculated) : 84.95  Vital Signs: Temp: 98 F (36.7 C) (06/29 0300) Temp src: Oral (06/29 0300) BP: 167/98 mmHg (06/29 0300) Pulse Rate: 84 (06/29 0300)  Labs:  Recent Labs  03/30/14 0500  HGB 13.8  HCT 40.2  PLT 214  CREATININE 0.85    Estimated Creatinine Clearance: 247.6 ml/min (by C-G formula based on Cr of 0.85).   Assessment: 43 yo obese male with history of afib presents with chest pain, recent syncope, and elevated d-dimer.  Patient is supposed to be on warfarin for history of afib but per Dr. Ludwig Clarksrenshaw's office visit 06/19/13, patient discontinued warfarin himself and started aspirin since he did not want to come for INR checks.  Troponins negative.  Unable to evaluate patient for PE due to large body habitus.  Pharmacy consulted to dose warfarin with Lovenox bridge on admission.  Now, MD would like to switch to Xarelto.   Weight = 265 kg; No dosage adjustment required for Xarelto dosing.  Body weight >120 kg did not significantly influence rivaroxaban exposure (Kubitza, 2007).  Renal: SCr 0.85, CrCl>100 ml/min  CBC WNL  Goal of Therapy:  VTE Treatment   Plan:  Continue 15 mg BID x 21 days followed by 20 mg daily.     Loma BostonLaura Moran, PharmD Pager: [phone] 03/30/2014 7:30 AM

## 2014-03-31 DIAGNOSIS — I2699 Other pulmonary embolism without acute cor pulmonale: Secondary | ICD-10-CM

## 2014-03-31 DIAGNOSIS — R0902 Hypoxemia: Secondary | ICD-10-CM

## 2014-03-31 LAB — GLUCOSE, CAPILLARY: GLUCOSE-CAPILLARY: 209 mg/dL — AB (ref 70–99)

## 2014-03-31 NOTE — Discharge Summary (Signed)
Physician Discharge Summary  [name] UEA:[id number] DOB: 12/23/1970 DOA: 03/25/2014  PCP: Emeterio ReeveWOLTERS,SHARON A, MD  Admit date: 03/25/2014 Discharge date: 03/31/2014  Recommendations for Outpatient Follow-up:  1. Continue current insulin regimen since your A1c was at goal this regimen seems to be working well. 2. Please follow up with PCP per scheduled appt. Notified your PCP to place order for MRI back for further evaluation of pain. 3. Continue albuterol inhaler and nebulizer as prescribed. 4. Use xarelto for atrial fibrillation. This is an anticoagulant (prescribed instead of coumadin). We encourage compliance with blood thinners.  Discharge Diagnoses:  Active Problems:   DIABETES MELLITUS, TYPE II   Atrial fibrillation   SLEEP APNEA   CHEST PAIN   Chest pain   Morbid obesity with body mass index of 60.0-69.9 in adult   HTN (hypertension), benign   Syncope   Asthma    Discharge Condition: stable   Diet recommendation: as tolerated   History of present illness:  43 y.o. male with multiple medical comorbidities including but not limited to morbid obesity, OSA with hypoventilation on CPAP, atrial fibrillation on coumadin who [facility] with worsening shortness of breath, chest tightness and coughing for past few days prior to this admission. CXR showed no acute cardiopulmonary process.   Assessment/Plan:   Principal Problem:  Acute respiratory failure with hypoxia / OSA and OHS / Asthma exacerbation  Patient was more short of breath 6/26 and we started solumedrol 125 mg x once IV and then 60 mg IV Q 12 hours. Tapered down to once a day solumedrol IV. Pulmonary consulted since pt still feels short of breath. They recommended getting doppler study which was negative for DVT. They also recommended continuing xarelto as prescribed. Pt has scheduled appt with our community clinic should he need assistance with xarelto refills. Appt is scheduled for 7/14 at 11:15  am. CPAP at bedtime  Unable to do perfusion scan for evaluation of possible PE secondary to weight. Please note that patient is on anticoagulation with Coumadin at home but has been noncompliant. Started xarelto. No evidence of bleeding. Active Problems:  DIABETES MELLITUS, TYPE II  A1c is 6.8 on this admission indicating god glycemic control  Per DM coordinator input changed insulin to 40 units BID and added novolog 10 units TID  A1c is actually at goal on this admission so it is reasonable that pt continues the previous insulin home regimen on discharge Atrial fibrillation  Patient is on anticoagulation with Coumadin at home however has been noncompliant. We have started xarelto on this admission.  Continue amiodarone. Has been seen by cardio who recommend continuing xarelto and amiodarone. CHEST PAIN / Near syncope  Cardiac enzymes x 2 WNL. Still has some chest tightness. His BNP was WNL on this admission.  Continue xarelto, aspirin not required due to increased risk of bleeding since he is already on xarelto  No acute findings on carotid doppler  Morbid obesity with body mass index of 60.0-69.9 in adult  Educated on diet  HTN (hypertension), benign  Continue Norvasc 10 mg daily, metoprolol 50 mg daily  Added hydralazine 10 mg PO Q 8 hours for better BP control.  Depression / Anxiety  Continue Wellbutrin and xanax 1 mg every 8 hours PO PRN   DVT prophylaxis: on therapeutic anticoagulation with xarelto. He was given Coumadin and Lovenox on the admission and this was changed to xarelto on 03/27/2014   Code Status: full code  Family Communication: plan of care  discussed with the patient   Consultants:  Pulmonary  Cardiology Procedures:  Carotid doppler LE oppler - negative for DVT Antibiotics:  None    Signed:  Manson Passey, MD  [facility] 03/31/2014, 11:09 AM  Pager #: [phone]   Discharge Exam: Filed Vitals:   03/31/14 0921  BP: 144/78  Pulse: 70   Temp:   Resp:    Filed Vitals:   03/31/14 0517 03/31/14 0519 03/31/14 0921 03/31/14 0936  BP:  147/85 144/78   Pulse:  58 70   Temp:  97.6 F (36.4 C)    TempSrc:  Oral    Resp:  24    Height:      Weight: 267.622 kg (590 lb)     SpO2:  96%  94%    General: Pt is alert, follows commands appropriately, not in acute distress Cardiovascular: Regular rate and rhythm, S1/S2 +, no murmurs Respiratory: Clear to auscultation bilaterally, no wheezing Abdominal: Soft, non tender, non distended, bowel sounds +, no guarding Extremities: chronic lymphedema, no cyanosis, pulses palpable bilaterally DP and PT Neuro: Grossly nonfocal  Discharge Instructions  Discharge Instructions   Call MD for:  difficulty breathing, headache or visual disturbances    Complete by:  As directed      Call MD for:  difficulty breathing, headache or visual disturbances    Complete by:  As directed      Call MD for:  persistant dizziness or light-headedness    Complete by:  As directed      Call MD for:  persistant dizziness or light-headedness    Complete by:  As directed      Call MD for:  persistant nausea and vomiting    Complete by:  As directed      Call MD for:  persistant nausea and vomiting    Complete by:  As directed      Call MD for:  severe uncontrolled pain    Complete by:  As directed      Call MD for:  severe uncontrolled pain    Complete by:  As directed      Diet - low sodium heart healthy    Complete by:  As directed      Diet - low sodium heart healthy    Complete by:  As directed      Discharge instructions    Complete by:  As directed   1. Continue current insulin regimen since your A1c was at goal this regimen seems to be working well. 2. Please follow up with PCP per scheduled appt. Notified your PCP to place order for MRI back for further evaluation of pain. 3. Continue albuterol inhaler and nebulizer as prescribed. 4. Use xarelto for atrial fibrillation. This is an  anticoagulant (prescribed instead of coumadin). We encourage compliance with blood thinners.     Increase activity slowly    Complete by:  As directed          Medication List         albuterol 108 (90 BASE) MCG/ACT inhaler  Commonly known as:  VENTOLIN HFA  Inhale 2 puffs into the lungs every 6 (six) hours as needed. Shortness of breath     ALPRAZolam 1 MG tablet  Commonly known as:  XANAX  Take 1 tablet (1 mg total) by mouth 3 (three) times daily as needed for anxiety.     amiodarone 200 MG tablet  Commonly known as:  PACERONE  Take 200 mg by mouth  daily.     amLODipine 10 MG tablet  Commonly known as:  NORVASC  Take 10 mg by mouth daily.     buPROPion 300 MG 24 hr tablet  Commonly known as:  WELLBUTRIN XL  Take 300 mg by mouth Daily.     cyclobenzaprine 10 MG tablet  Commonly known as:  FLEXERIL  Take 10 mg by mouth 3 (three) times daily as needed for muscle spasms.     divalproex 500 MG DR tablet  Commonly known as:  DEPAKOTE  Take 1,000 mg by mouth at bedtime.     DSS 100 MG Caps  Take 100 mg by mouth 2 (two) times daily as needed for mild constipation.     HUMALOG MIX 75/25 (75-25) 100 UNIT/ML Susp injection  Generic drug:  insulin lispro protamine-lispro  Inject 40-75 Units into the skin Three times a day before meals. Takes 75 units before breakfast Takes 40 units before lunch Takes 65 units before supper     hydrALAZINE 10 MG tablet  Commonly known as:  APRESOLINE  Take 1 tablet (10 mg total) by mouth every 8 (eight) hours.     HYDROcodone-acetaminophen 5-325 MG per tablet  Commonly known as:  NORCO/VICODIN  Take 1-2 tablets by mouth every 4 (four) hours as needed for moderate pain.     lamoTRIgine 100 MG tablet  Commonly known as:  LAMICTAL  Take 100 mg by mouth daily.     levalbuterol 1.25 MG/0.5ML nebulizer solution  Commonly known as:  XOPENEX  Take 1.25 mg by nebulization every 6 (six) hours as needed for wheezing or shortness of breath.      metoprolol succinate 50 MG 24 hr tablet  Commonly known as:  TOPROL-XL  Take 50 mg by mouth daily.     nitroGLYCERIN 0.4 MG SL tablet  Commonly known as:  NITROSTAT  Place 0.4 mg under the tongue once.     Rivaroxaban 15 MG Tabs tablet  Commonly known as:  XARELTO  Take 1 tablet (15 mg total) by mouth 2 (two) times daily with a meal.     traMADol 50 MG tablet  Commonly known as:  ULTRAM  Take 50-100 mg by mouth every 6 (six) hours as needed for pain.           Follow-up Information   Follow up with Emeterio ReeveWOLTERS,SHARON A, MD On 04/01/2014. (Follow up appt after recent hospitalization; 1:15 am)    Specialty:  Family Medicine   Contact information:   [address] [place] [address] [phone]       Follow up with Barbaraann ShareLANCE,KEITH M, MD. Schedule an appointment as soon as possible for a visit in 1 month. (To follow up for sleep apnea)    Specialty:  Pulmonary Disease   Contact information:   [address] [place] [address] [phone]       Follow up with [facility]    . Schedule an appointment as soon as possible for a visit on 04/14/2014. (Follow up appt after recent hospitalization; for xarelto refills; appt scheduled for 11:30am)    Contact information:   [address] [place] [address] [phone]       The results of significant diagnostics from this hospitalization (including imaging, microbiology, ancillary and laboratory) are listed below for reference.    Significant Diagnostic Studies: Dg Chest Port 1 View  03/25/2014   CLINICAL DATA:  Chest pain.  EXAM: PORTABLE CHEST -  1 VIEW  COMPARISON:  05/09/2013  FINDINGS: The heart is enlarged but stable. The lungs are clear. No pleural effusion. No pneumothorax. The bony thorax is grossly normal.  IMPRESSION: No acute cardiopulmonary findings.   Electronically Signed   By: Loralie Champagne M.D.   On: 03/25/2014 21:15    Microbiology: No results found for  this or any previous visit (from the past 240 hour(s)).   Labs: Basic Metabolic Panel:  Recent Labs Lab 03/25/14 2110 03/26/14 0435 03/30/14 0500  NA 139 141  --   K 3.6* 3.8  --   CL 102 104  --   CO2 23 24  --   GLUCOSE 212* 178*  --   BUN 11 11  --   CREATININE 0.94 1.01  1.00 0.85  CALCIUM 8.7 8.6  --   MG  --  1.8  --   PHOS  --  3.4  --    Liver Function Tests:  Recent Labs Lab 03/26/14 0435  AST 12  ALT 6  ALKPHOS 58  BILITOT 0.4  PROT 6.9  ALBUMIN 3.3*   No results found for this basename: LIPASE, AMYLASE,  in the last 168 hours No results found for this basename: AMMONIA,  in the last 168 hours CBC:  Recent Labs Lab 03/25/14 2110 03/26/14 0435 03/27/14 0525 03/30/14 0500  WBC 10.6* 9.8 8.9 13.9*  HGB 13.9 13.3 14.1 13.8  HCT 41.1 40.1 41.9 40.2  MCV 82.7 84.4 85.0 82.0  PLT 234 207 225 214   Cardiac Enzymes:  Recent Labs Lab 03/26/14 0435 03/26/14 1010 03/26/14 1634  TROPONINI <0.30 <0.30 <0.30   BNP: BNP (last 3 results)  Recent Labs  03/25/14 2110  PROBNP 33.8   CBG:  Recent Labs Lab 03/30/14 0737 03/30/14 1201 03/30/14 1655 03/30/14 2127 03/31/14 0732  GLUCAP 217* 225* 252* 272* 209*    Time coordinating discharge: Over 30 minutes

## 2014-03-31 NOTE — Progress Notes (Signed)
All D/c paper work completed. Pt verbalized understanding of  D/G paper work . All prescriptions given to pt

## 2014-03-31 NOTE — Progress Notes (Signed)
ANTICOAGULATION CONSULT NOTE - Follow Up   Pharmacy Consult for Xarelto Indication: atrial fibrillation and pulmonary embolus  Allergies  Allergen Reactions  . Iodine Anaphylaxis and Other (See Comments)    Associated with the shellfish allergy  . Shellfish Allergy Anaphylaxis    Patient Measurements: Height: 6' 3.2" (191 cm) Weight: 590 lb (267.622 kg) IBW/kg (Calculated) : 84.95  Vital Signs: Temp: 97.6 F (36.4 C) (06/30 0519) Temp src: Oral (06/30 0519) BP: 144/78 mmHg (06/30 0921) Pulse Rate: 70 (06/30 0921)  Labs:  Recent Labs  03/30/14 0500  HGB 13.8  HCT 40.2  PLT 214  CREATININE 0.85    Estimated Creatinine Clearance: 250.4 ml/min (by C-G formula based on Cr of 0.85).   Assessment: 43 yo obese male with history of afib presents with chest pain, recent syncope, and elevated d-dimer.  Patient is supposed to be on warfarin for history of afib but per Dr. Ludwig Clarksrenshaw's office visit 06/19/13, patient discontinued warfarin himself and started aspirin since he did not want to come for INR checks.  Troponins negative.  Unable to evaluate patient for PE due to large body habitus.  Pharmacy consulted to dose warfarin with Lovenox bridge on admission.  Now, MD would like to switch to Xarelto.   Weight = 265 kg; No dosage adjustment required for Xarelto dosing.  Body weight >120 kg did not significantly influence rivaroxaban exposure (Kubitza, 2007).  Renal: SCr 1.01, CrCl>100 ml/min (6/25)  CBC WNL (WBC elevated, pt on solu-medrol)  Goal of Therapy:  VTE Treatment   Plan:  1.  Continue Xarelto 15 mg BID x 21 days followed by 20 mg daily.  2. Education completed   Loma BostonLaura Moran, PharmD Pager: [phone] 03/31/2014 11:02 AM ,

## 2014-03-31 NOTE — Progress Notes (Signed)
Name: [name] MRN: [id number] DOB: 08/01/1971    ADMISSION DATE:  03/25/2014 CONSULTATION DATE:  03/30/14  REFERRING MD :  Dr. Elisabeth Pigeonevine  PRIMARY SERVICE:  TRH  CHIEF COMPLAINT:  Shortness of breath  BRIEF PATIENT DESCRIPTION: 43 y/o M with PMH of Afib (non-compliant with coumadin), Morbid Obesity (BMI 72), Chronic Pain, DM, Depression, OSA (not on CPAP) who was admitted on 6/25 with 1 wk hx of chest pain and syncopal episode.  PCCM consulted for evaluation of ongoing dyspnea.    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 D-Dimer 0.73  STUDIES:  6/25 - Carotid Doppler >> vertebral arteries patent, poor image quality due to habitus.  Mild stenosis of R/L ICA 6/25 - ECHO >> EF 55-60%, ? If bicuspid AV - mild stenosis, mild aortic root dilation  6/30 - LE Doppler >> prelim neg for DVT >>   SUBJECTIVE: Pt denies acute complaints.    VITAL SIGNS: Temp:  [97.6 F (36.4 C)-98 F (36.7 C)] 97.6 F (36.4 C) (06/30 0519) Pulse Rate:  [58-81] 58 (06/30 0519) Resp:  [24] 24 (06/30 0519) BP: (147-168)/(80-89) 147/85 mmHg (06/30 0519) SpO2:  [90 %-96 %] 96 % (06/30 0519) Weight:  [590 lb (267.622 kg)] 590 lb (267.622 kg) (06/30 0517)  PHYSICAL EXAMINATION: General:  Morbidly obese male in NAD  Neuro:  AAOx4, speech clear, MAE HEENT:  Mm pink/moist, short thick neck Cardiovascular:  s1s2 rrr distant tones due to habitus Lungs:  resp's even/non-labored, lungs bilaterally clear  Abdomen:  Obese, soft, bsx4 active, no pitting edema Musculoskeletal:  No acute deformities  Skin:  Warm/dry, trach LE edema    Recent Labs Lab 03/25/14 2110 03/26/14 0435 03/30/14 0500  NA 139 141  --   K 3.6*  3.8  --   CL 102 104  --   CO2 23 24  --   BUN 11 11  --   CREATININE 0.94 1.01  1.00 0.85  GLUCOSE 212* 178*  --     Recent Labs Lab 03/26/14 0435 03/27/14 0525 03/30/14 0500  HGB 13.3 14.1 13.8  HCT 40.1 41.9 40.2  WBC 9.8 8.9 13.9*  PLT 207 225 214   No results found.  ASSESSMENT / PLAN:  Dyspnea - multifactorial in the setting of morbid obesity, OSA, chronic hypoxia, ? PH, +/- PE Severe obstructive sleep apnea / hypopnea syndrome - last sleep study 08/2009 with 66 events per hour, and desaturations on 2L to 72%.   Chronic hypoxic respiratory failure Morbid Obesity  Treat as Presumed PE - unable to assess CT due to habitus > 6/29 LE doppler negative  Plan: -Lasix 40 mg BID + KCL x 2 doses -O2 at 3L per [place] > assessed with PT, remained 92% on RA with ambulation  -continue xarelto indefinitely (indication A-fib and presumed PE) -CPAP QHS + PRN sleep -ensure compliance with CPAP -follow up with Dr. Shelle Ironlance at patients convenience for OSA /OHS -consider candidacy for bariatric surgery   Syncope / Chest Pain - continues to report exertional chest pain.   Atrial Fibrillation  HTN ? Bicuspid AV with Aortic Root Dilation  - noted on ECHO  Plan: -Noted  Cardiology input -Unable to perform further diagnostic exams due to body habitus   Pending d/c per primary. Ok to go from pulmonary standpoint.   Canary BrimBrandi Ollis, NP-C [facility] Pgr: [phone] or [phone]   03/31/2014, 8:37 AM  Attending:  I have seen and examined the patient with nurse practitioner/resident and agree with the note above.   We think the syncope was from a PE, risk factor being immobility from obesity.  Considering his Afib and prolonged immobility from obesity, would plan on lifelong anticoagulation.  I explained to him today the importance of compliance with O2, CPAP and Xarelto.  His non-compliance and obesity are clearly his biggest challenges.  Heber CarolinaBrent McQuaid, MD [place]  PCCM Pager: [phone] Cell: [phone] If no response, call [phone]

## 2014-03-31 NOTE — Progress Notes (Signed)
CARDIOLOGY CONSULT NOTE   Patient ID: [name] MRN: [id number], DOB/AGE: July 07, 1971   Admit date: 03/25/2014 Date of Consult: 03/31/2014   Primary Physician: Emeterio Reeve, MD Primary Cardiologist: Dr. Jens Som  Pt. Profile  morbidly obese 43 year old  male with past medical history significant for hypertension, history of chronic diastolic heart failure, history of atrial fibrillation/aflutter, diabetes and obstructive sleep apnea noncompliant with CPAP presented to [facility] with sharp chest pain that has been persistent for 1 wk  Problem List  Past Medical History  Diagnosis Date  . CHF (congestive heart failure)   . HTN (hypertension)   . Ptosis   . Atrial flutter   . Atrial fibrillation   . Morbid obesity   . DM2 (diabetes mellitus, type 2)   . Depression   . Respiratory failure   . Chronic low back pain   . Diabetes mellitus   . Asthma   . Vision abnormalities   . Abscess     right thigh    Past Surgical History  Procedure Laterality Date  . Tonsillectomy    . Cholecystectomy    . Tonsillectomy    . Irrigation and debridement abscess  10/13/2011    Procedure: IRRIGATION AND DEBRIDEMENT ABSCESS;  Surgeon: Emelia Loron, MD;  Location: [facility] OR;  Service: General;  Laterality: Right;  Irrigation and debridement right thigh abscess  . Application of wound vac      right thigh abscess     Allergies  Allergies  Allergen Reactions  . Iodine Anaphylaxis and Other (See Comments)    Associated with the shellfish allergy  . Shellfish Allergy Anaphylaxis    HPI   The patient is a morbidly obese 43 year old Caucasian male with past medical history significant for hypertension, history of chronic diastolic heart failure, history of atrial fibrillation/aflutter, diabetes and obstructive sleep apnea noncompliant with CPAP. Of note, patient had multiple history of atypical chest discomfort. He has a history of atrial fibrillation and atrial flutter under went  successful TEE cardioversion in Oct 2010. He was considered for NOAC before, however due to financial reasons, he had to switch to Coumadin. His last dose of Coumadin was last year and he did not discuss with cardiology further after being rejected by his insurance company for NOAC. He has been followed up by Dr. Jens Som in the clinic, his last followup was on 06/19/2013 at which time he was doing well without complaint of any chest pain. According to the patient, he denies any recent fever, chill, cough, he does endorse recent worsening of lower extremity edema, 3-4 pillow orthopnea or paroxysmal nocturnal dyspnea. He states he has been having progressive worsening dyspnea on exertion since last year. Surprisingly, he lost about 60 pounds since last year despite worsening SOB. He states he has not been compliant with CPAP and he has been going through a period of depression when he lost his apartment and has to move in with family member.  Patient states he was recently enrolled in school, and spent the majority of Saturday 03/20/2014 walking around the campus as part of the new student orientation. He states he would experience chest pain after walking 2 steps and had to stop and rest. He described the chest pain is sharp substernal pain radiating to the right side worse with exertion, lifting of R arm, rotation of the upper body, deep inspiration and chest palpation. On Sunday, patient states he woke up tired after walking around the campus on hot summer day on Saturday. He  eventually went to the church. While at the church, patient felt severely dizzy and had to sit down into the chair. He also describes some visual changes such as dark vision for several seconds. Patient also had significant chest pain which persisted to this day. [facility]. Troponin on arrival was negative. However d-dimer was elevated at 0.73, where as it was negative 10 month ago. He was admitted by  medicine service. Serial troponins were obtained which were negative. However patient continued to have severe dyspnea and persistent chest pain. He states his chest pain never went away since admission and would be worse with deep inspiration, palpitation, body rotation, or exertion. 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 He was previously consider for pulmonary perfusion scan, however due to his significant body habitus, this could not be done. Echocardiogram was obtained on 03/26/2014 which showed EF 55-60%, mildly dilated left ventricle, moderate LVH, bicuspid aortic valve morphology cannot be excluded. Pulmonary critical care was consulted in the morning of 03/30/2014 for persistent dyspnea who recommended lower extremity ultrasound and cardiac evaluation.  Inpatient Medications  . amiodarone  200 mg Oral Daily  . amLODipine  10 mg Oral Daily  . buPROPion  300 mg Oral Daily  . divalproex  1,000 mg Oral QHS  . docusate sodium  100 mg Oral BID  . hydrALAZINE  10 mg Oral 3 times per day  . insulin aspart  0-5 Units Subcutaneous QHS  . insulin aspart  0-9 Units Subcutaneous TID WC  . insulin aspart  10 Units Subcutaneous TID WC  . insulin glargine  40 Units Subcutaneous BID  . lamoTRIgine  100 mg Oral Daily  . levalbuterol  1.25 mg Nebulization TID  . methylPREDNISolone (SOLU-MEDROL) injection  40 mg Intravenous Q24H  . metoprolol succinate  50 mg Oral Daily  . Rivaroxaban  15 mg Oral BID WC  . senna-docusate  2 tablet Oral QHS  . sodium chloride  3 mL Intravenous Q12H    Family History Family History  Problem Relation Age of Onset  . Coronary artery disease Mother     2340s; father had it in 2230s; both deceased in their 9070s    . Cancer Mother     liver  . Heart disease Father   . Cancer Father     prostate     Social History History   Social History  . Marital Status: Divorced    Spouse Name: N/A    Number of Children:  N/A  . Years of Education: N/A   Occupational History  . unemployeed    Social History Main Topics  . Smoking status: Never Smoker   . Smokeless tobacco: Not on file     Comment: no significant tobacco use   . Alcohol Use: Yes     Comment: no significant use-- rare  . Drug Use: No  . Sexual Activity: Not on file   Other Topics Concern  . Not on file   Social History Narrative   [place]ives in [place]; works full time at [employer].    Occasionally takes echinacea and B12 supplements.    Regular diet; has not exercised regularly over the last 3 months.       Physical Exam  Blood pressure 147/85, pulse 58, temperature 97.6 F (36.4 C), temperature source Oral, resp. rate 24, height 6' 3.2" (1.91 m), weight 590 lb (267.622 kg), SpO2 96.00%.  Body mass  index is 73.36 kg/(m^2).  General: Pleasant, NAD. Morbidly obese Psych: Normal affect. Neuro: Alert and oriented X 3. Moves all extremities spontaneously. HEENT: Normal  Neck: Supple without bruits. Unable to assess JVD due to excessive tissue around neck Lungs:  Resp regular and unlabored, CTA. Heart: RRR no s3, s4, or murmurs. Abdomen: Soft, non-tender, non-distended, BS + x 4.  Extremities: No clubbing, cyanosis or edema. DP/PT/Radials 2+ and equal bilaterally.  Labs  No results found for this basename: CKTOTAL, CKMB, TROPONINI,  in the last 72 hours Lab Results  Component Value Date   WBC 13.9* 03/30/2014   HGB 13.8 03/30/2014   HCT 40.2 03/30/2014   MCV 82.0 03/30/2014   PLT 214 03/30/2014     Recent Labs Lab 03/26/14 0435 03/30/14 0500  NA 141  --   K 3.8  --   CL 104  --   CO2 24  --   BUN 11  --   CREATININE 1.01  1.00 0.85  CALCIUM 8.6  --   PROT 6.9  --   BILITOT 0.4  --   ALKPHOS 58  --   ALT 6  --   AST 12  --   GLUCOSE 178*  --    Lab Results  Component Value Date   CHOL 149 03/26/2014   HDL 34* 03/26/2014   LDLCALC 82 03/26/2014   TRIG 167* 03/26/2014   Lab Results  Component Value Date   DDIMER  0.73* 03/25/2014    Radiology/Studies  Dg Chest Port 1 View  03/25/2014   CLINICAL DATA:  Chest pain.  EXAM: PORTABLE CHEST - 1 VIEW  COMPARISON:  05/09/2013  FINDINGS: The heart is enlarged but stable. The lungs are clear. No pleural effusion. No pneumothorax. The bony thorax is grossly normal.  IMPRESSION: No acute cardiopulmonary findings.   Electronically Signed   By: Loralie ChampagneMark  Gallerani M.D.   On: 03/25/2014 21:15    ECG  NSR with HR 70s, no significant ST-T wave changes  ASSESSMENT AND PLAN  1. Atypical chest pain  - elevated d-dimer,  Our options to evaluate his heart are limited.  Echo reveals normal EF of 55-60%.    He will not fit on the nuclear stress table.  We are unable to do a cath given his severe allergy to iodine.   Continue with medical therapy.   2. Syncope: unclear cause, may be related to dehydration vs arrthythmia vs hypotension  3. Possible bicuspid aortic valve with mild aortic dilatation on echo  4. Acute on chronic respiratory failure  - progressively worsened since last years, likely related to body habitatus, suprisingly his symptom is worse after 60 lbs weight loss   5. DM 6. Morbid obesity with BM >73 7. H/o a-fib, off coumadin since last year after rejected by his insurance for NOAC  - continue amiodarone, no CXR finding to suggest amio toxicity   - restarted on Xarelto, may need assistance program if decide on Xarelto for long term 8. HTN 9. Depression 10. OSA, noncompliant with CPAP    Vesta MixerPhilip J. Nahser, Montez HagemanJr., MD, [facility]FACC 03/31/2014, 8:28 AM [address],  [address] Office [phone] Pager [phone]- [phone]

## 2014-04-03 NOTE — ED Provider Notes (Signed)
Medical screening examination/treatment/procedure(s) were performed by non-physician practitioner and as supervising physician I was immediately available for consultation/collaboration.   EKG Interpretation   Date/Time:  Wednesday March 25 2014 20:55:08 EDT Ventricular Rate:  94 PR Interval:  196 QRS Duration: 89 QT Interval:  371 QTC Calculation: 464 R Axis:   -24 Text Interpretation:  Sinus rhythm Borderline left axis deviation Low  voltage, precordial leads Probable anterolateral infarct, old No  significant change since last tracing Confirmed by GOLDSTON  MD, SCOTT  [phone]) on 03/25/2014 11:18:08 PM       Ethelda ChickMartha K Linker, MD 04/03/14 [phone]

## 2014-04-14 ENCOUNTER — Inpatient Hospital Stay: Payer: Medicare Other | Admitting: Internal Medicine

## 2014-04-23 ENCOUNTER — Encounter: Payer: Medicare Other | Attending: Family Medicine | Admitting: *Deleted

## 2014-04-23 VITALS — Ht 75.0 in | Wt >= 6400 oz

## 2014-04-23 DIAGNOSIS — Z794 Long term (current) use of insulin: Secondary | ICD-10-CM | POA: Insufficient documentation

## 2014-04-23 DIAGNOSIS — E119 Type 2 diabetes mellitus without complications: Secondary | ICD-10-CM | POA: Diagnosis present

## 2014-04-23 DIAGNOSIS — Z6841 Body Mass Index (BMI) 40.0 and over, adult: Secondary | ICD-10-CM | POA: Diagnosis not present

## 2014-04-23 DIAGNOSIS — Z713 Dietary counseling and surveillance: Secondary | ICD-10-CM | POA: Insufficient documentation

## 2014-04-23 NOTE — Progress Notes (Signed)
Appt start time: 1530 end time: 1600.  Assessment:  Patient was seen on  04/23/14 for individual diabetes education and obesity follow up visit. Was hospitalized for chest pain in June, 2015. He states he is now back on walking. Has broken up with his fiancee, this has been very stressful. His mother in law passed away in May, this year.  He resumed his walking, healthier eating and taking medications one month ago. 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 He is active in his church which is growing and he plans to He is planning to attend the Gastric Bypass In-service on July 28th to find out more about options with that.  He is still living with his cousin but they are having some arguments, so he is planning to move into an apartment next month.  From June 25 to today he has had a 30 # weight loss!  SMBG before meals and at bedtime with reported range of 98 this AM. He admits to omitting insulin the last couple of weeks due to depression but he is feeling better now and more hopeful of his future.  He states no hypoglycemia at this time.  Current HbA1c:  13.9% on referral, Now down to 6.8%.  MEDICATIONS: see list. Diabetes medication is Humalog 75/25 @ 75 in AM. 20 mid day and 75 in PM although with the recent weight loss he states he is not taking any insulin anymore.    DIETARY INTAKE:  Usual eating pattern includes 3 meals and 2 snacks per day.  Everyday foods include good variety of all food groups now.  Avoided foods include high fat and high sugar foods.    24-hr recall:  B (8 AM): 3 sausage links and 2 eggs,OR 1 cup Raisin Bran with  Milk,  water  Snk ( AM): SF popsicle OR PNB with crackers OR 1/2 banana  L (2 PM): sandwich and soup OR 2 sandwiches with lean meat OR salad with protein added, [place] or light [place] dressing, eater Snk ( PM): same as AM D (7 PM): lean meat x 1-2 servings, starch, vegetables, water Snk ( PM):  only if BG is too low before bedtime, PNB to hold over night Beverages: water  Usual physical activity: walking outside rain or shine, on trail near his home currently 30 minutes each way  Estimated energy needs: 2400 calories 270 g carbohydrates 180 g protein 67 g fat  Intervention:  Nutrition counseling continued. Commended him on his continued weight loss based on his behaviors of increased activity on a daily basis and reduced food intake in a balanced and healthy way.   Plan :  Continue to aim for 5 Carb Choices per meal (75 grams) +/- 1 either way  Continue to aim for 0-2 Carbs per snack if hungry  Consider reading food labels for Total Carbohydrate of foods Continue with your activity level by walking for 30-60 minutes daily as tolerated Continue checking BG daily as directed by MD  Your weight today was 555.2 pounds! Your last A1c on 03/26/14 was 6.8%   Handouts given during visit include:  Barriers to learning/adherance to lifestyle change: morbid obesity, visual impairment  Diabetes self-care support plan:   [facility] support group  Continued diabetes education  Monitoring/Evaluation:  Dietary intake, exercise, reading food labels, and body weight in 6 week(s).

## 2014-04-23 NOTE — Patient Instructions (Addendum)
Plan :  Continue to aim for 5 Carb Choices per meal (75 grams) +/- 1 either way  Continue to aim for 0-2 Carbs per snack if hungry  Consider reading food labels for Total Carbohydrate of foods Continue with your activity level by walking for 30-60 minutes daily as tolerated Continue checking BG daily as directed by MD  Your weight today was 555.2 pounds! Your last A1c on 03/26/14 was 6.8%

## 2014-04-30 ENCOUNTER — Encounter: Payer: Self-pay | Admitting: *Deleted

## 2014-05-02 ENCOUNTER — Other Ambulatory Visit: Payer: Self-pay | Admitting: Internal Medicine

## 2014-06-10 ENCOUNTER — Ambulatory Visit: Payer: Medicare Other | Admitting: *Deleted

## 2014-06-10 ENCOUNTER — Ambulatory Visit (INDEPENDENT_AMBULATORY_CARE_PROVIDER_SITE_OTHER): Payer: Medicare Other | Admitting: Surgery

## 2014-06-15 ENCOUNTER — Inpatient Hospital Stay (HOSPITAL_COMMUNITY)
Admission: EM | Admit: 2014-06-15 | Discharge: 2014-06-26 | DRG: 193 | Disposition: A | Discharge status: Home Health | Payer: Medicare Other | Attending: Pulmonary Disease | Admitting: Pulmonary Disease

## 2014-06-15 ENCOUNTER — Encounter (HOSPITAL_COMMUNITY): Payer: Self-pay | Admitting: Emergency Medicine

## 2014-06-15 ENCOUNTER — Emergency Department (HOSPITAL_COMMUNITY): Payer: Medicare Other

## 2014-06-15 DIAGNOSIS — Z7901 Long term (current) use of anticoagulants: Secondary | ICD-10-CM

## 2014-06-15 DIAGNOSIS — Z9109 Other allergy status, other than to drugs and biological substances: Secondary | ICD-10-CM

## 2014-06-15 DIAGNOSIS — G8929 Other chronic pain: Secondary | ICD-10-CM | POA: Diagnosis present

## 2014-06-15 DIAGNOSIS — G4733 Obstructive sleep apnea (adult) (pediatric): Secondary | ICD-10-CM | POA: Diagnosis present

## 2014-06-15 DIAGNOSIS — M545 Low back pain, unspecified: Secondary | ICD-10-CM | POA: Diagnosis present

## 2014-06-15 DIAGNOSIS — I509 Heart failure, unspecified: Secondary | ICD-10-CM | POA: Diagnosis present

## 2014-06-15 DIAGNOSIS — I4891 Unspecified atrial fibrillation: Secondary | ICD-10-CM | POA: Diagnosis present

## 2014-06-15 DIAGNOSIS — F329 Major depressive disorder, single episode, unspecified: Secondary | ICD-10-CM | POA: Diagnosis present

## 2014-06-15 DIAGNOSIS — J9601 Acute respiratory failure with hypoxia: Secondary | ICD-10-CM | POA: Diagnosis present

## 2014-06-15 DIAGNOSIS — Z8249 Family history of ischemic heart disease and other diseases of the circulatory system: Secondary | ICD-10-CM

## 2014-06-15 DIAGNOSIS — J9600 Acute respiratory failure, unspecified whether with hypoxia or hypercapnia: Secondary | ICD-10-CM

## 2014-06-15 DIAGNOSIS — Z79899 Other long term (current) drug therapy: Secondary | ICD-10-CM

## 2014-06-15 DIAGNOSIS — E119 Type 2 diabetes mellitus without complications: Secondary | ICD-10-CM | POA: Diagnosis present

## 2014-06-15 DIAGNOSIS — E872 Acidosis, unspecified: Secondary | ICD-10-CM | POA: Diagnosis present

## 2014-06-15 DIAGNOSIS — E662 Morbid (severe) obesity with alveolar hypoventilation: Secondary | ICD-10-CM | POA: Diagnosis present

## 2014-06-15 DIAGNOSIS — M5430 Sciatica, unspecified side: Secondary | ICD-10-CM | POA: Diagnosis present

## 2014-06-15 DIAGNOSIS — Z91013 Allergy to seafood: Secondary | ICD-10-CM

## 2014-06-15 DIAGNOSIS — J189 Pneumonia, unspecified organism: Principal | ICD-10-CM | POA: Diagnosis present

## 2014-06-15 DIAGNOSIS — Z794 Long term (current) use of insulin: Secondary | ICD-10-CM

## 2014-06-15 DIAGNOSIS — R0989 Other specified symptoms and signs involving the circulatory and respiratory systems: Secondary | ICD-10-CM | POA: Diagnosis present

## 2014-06-15 DIAGNOSIS — Z23 Encounter for immunization: Secondary | ICD-10-CM | POA: Diagnosis not present

## 2014-06-15 DIAGNOSIS — J4541 Moderate persistent asthma with (acute) exacerbation: Secondary | ICD-10-CM

## 2014-06-15 DIAGNOSIS — I10 Essential (primary) hypertension: Secondary | ICD-10-CM | POA: Diagnosis present

## 2014-06-15 DIAGNOSIS — G9340 Encephalopathy, unspecified: Secondary | ICD-10-CM | POA: Diagnosis present

## 2014-06-15 DIAGNOSIS — I4892 Unspecified atrial flutter: Secondary | ICD-10-CM | POA: Diagnosis present

## 2014-06-15 DIAGNOSIS — M940 Chondrocostal junction syndrome [Tietze]: Secondary | ICD-10-CM | POA: Diagnosis present

## 2014-06-15 DIAGNOSIS — F3289 Other specified depressive episodes: Secondary | ICD-10-CM | POA: Diagnosis present

## 2014-06-15 DIAGNOSIS — R6510 Systemic inflammatory response syndrome (SIRS) of non-infectious origin without acute organ dysfunction: Secondary | ICD-10-CM

## 2014-06-15 DIAGNOSIS — Z6841 Body Mass Index (BMI) 40.0 and over, adult: Secondary | ICD-10-CM

## 2014-06-15 DIAGNOSIS — J9620 Acute and chronic respiratory failure, unspecified whether with hypoxia or hypercapnia: Secondary | ICD-10-CM | POA: Diagnosis present

## 2014-06-15 DIAGNOSIS — I480 Paroxysmal atrial fibrillation: Secondary | ICD-10-CM | POA: Diagnosis present

## 2014-06-15 DIAGNOSIS — J45909 Unspecified asthma, uncomplicated: Secondary | ICD-10-CM | POA: Diagnosis present

## 2014-06-15 DIAGNOSIS — D649 Anemia, unspecified: Secondary | ICD-10-CM | POA: Diagnosis present

## 2014-06-15 DIAGNOSIS — R0609 Other forms of dyspnea: Secondary | ICD-10-CM | POA: Diagnosis not present

## 2014-06-15 DIAGNOSIS — B372 Candidiasis of skin and nail: Secondary | ICD-10-CM | POA: Diagnosis present

## 2014-06-15 DIAGNOSIS — F411 Generalized anxiety disorder: Secondary | ICD-10-CM | POA: Diagnosis present

## 2014-06-15 DIAGNOSIS — Z800 Family history of malignant neoplasm of digestive organs: Secondary | ICD-10-CM | POA: Diagnosis not present

## 2014-06-15 LAB — URINALYSIS, ROUTINE W REFLEX MICROSCOPIC
BILIRUBIN URINE: NEGATIVE
GLUCOSE, UA: NEGATIVE mg/dL
HGB URINE DIPSTICK: NEGATIVE
KETONES UR: NEGATIVE mg/dL
Leukocytes, UA: NEGATIVE
Nitrite: NEGATIVE
PROTEIN: NEGATIVE mg/dL
Specific Gravity, Urine: 1.013 (ref 1.005–1.030)
Urobilinogen, UA: 0.2 mg/dL (ref 0.0–1.0)
pH: 5.5 (ref 5.0–8.0)

## 2014-06-15 LAB — COMPREHENSIVE METABOLIC PANEL
ALK PHOS: 70 U/L (ref 39–117)
ALT: 9 U/L (ref 0–53)
ANION GAP: 12 (ref 5–15)
AST: 15 U/L (ref 0–37)
Albumin: 3.3 g/dL — ABNORMAL LOW (ref 3.5–5.2)
BILIRUBIN TOTAL: 0.9 mg/dL (ref 0.3–1.2)
BUN: 18 mg/dL (ref 6–23)
CO2: 28 mEq/L (ref 19–32)
CREATININE: 1.1 mg/dL (ref 0.50–1.35)
Calcium: 9.3 mg/dL (ref 8.4–10.5)
Chloride: 100 mEq/L (ref 96–112)
GFR calc non Af Amer: 81 mL/min — ABNORMAL LOW (ref >90)
GLUCOSE: 127 mg/dL — AB (ref 70–99)
POTASSIUM: 4.3 meq/L (ref 3.7–5.3)
Sodium: 140 mEq/L (ref 137–147)
TOTAL PROTEIN: 8.6 g/dL — AB (ref 6.0–8.3)

## 2014-06-15 LAB — CBC WITH DIFFERENTIAL/PLATELET
Basophils Absolute: 0 10*3/uL (ref 0.0–0.1)
Basophils Relative: 0 % (ref 0–1)
Eosinophils Absolute: 0.2 10*3/uL (ref 0.0–0.7)
Eosinophils Relative: 1 % (ref 0–5)
HCT: 42.3 % (ref 39.0–52.0)
HEMOGLOBIN: 14.2 g/dL (ref 13.0–17.0)
LYMPHS ABS: 2.3 10*3/uL (ref 0.7–4.0)
Lymphocytes Relative: 14 % (ref 12–46)
MCH: 28.3 pg (ref 26.0–34.0)
MCHC: 33.6 g/dL (ref 30.0–36.0)
MCV: 84.3 fL (ref 78.0–100.0)
MONOS PCT: 8 % (ref 3–12)
Monocytes Absolute: 1.3 10*3/uL — ABNORMAL HIGH (ref 0.1–1.0)
Neutro Abs: 12.9 10*3/uL — ABNORMAL HIGH (ref 1.7–7.7)
Neutrophils Relative %: 77 % (ref 43–77)
Platelets: 261 10*3/uL (ref 150–400)
RBC: 5.02 MIL/uL (ref 4.22–5.81)
RDW: 14.7 % (ref 11.5–15.5)
WBC: 16.7 10*3/uL — ABNORMAL HIGH (ref 4.0–10.5)

## 2014-06-15 LAB — BLOOD GAS, ARTERIAL
ACID-BASE EXCESS: 2.6 mmol/L — AB (ref 0.0–2.0)
Bicarbonate: 27.3 mEq/L — ABNORMAL HIGH (ref 20.0–24.0)
DRAWN BY: 406621
O2 CONTENT: 7 L/min
O2 SAT: 88.9 %
Patient temperature: 98.6
TCO2: 24.2 mmol/L (ref 0–100)
pCO2 arterial: 44.6 mmHg (ref 35.0–45.0)
pH, Arterial: 7.404 (ref 7.350–7.450)
pO2, Arterial: 57 mmHg — ABNORMAL LOW (ref 80.0–100.0)

## 2014-06-15 LAB — I-STAT TROPONIN, ED: Troponin i, poc: 0.01 ng/mL (ref 0.00–0.08)

## 2014-06-15 LAB — APTT: aPTT: 35 seconds (ref 24–37)

## 2014-06-15 LAB — PROCALCITONIN: Procalcitonin: 0.21 ng/mL

## 2014-06-15 LAB — I-STAT CG4 LACTIC ACID, ED: Lactic Acid, Venous: 2.29 mmol/L — ABNORMAL HIGH (ref 0.5–2.2)

## 2014-06-15 MED ORDER — IPRATROPIUM-ALBUTEROL 0.5-2.5 (3) MG/3ML IN SOLN
3.0000 mL | Freq: Four times a day (QID) | RESPIRATORY_TRACT | Status: DC
  Administered 2014-06-16 – 2014-06-21 (×21): 3 mL via RESPIRATORY_TRACT
  Filled 2014-06-15 (×22): qty 3

## 2014-06-15 MED ORDER — ACETAMINOPHEN 40 MG HALF SUPP
1000.0000 mg | Freq: Once | RECTAL | Status: DC
  Filled 2014-06-15: qty 1

## 2014-06-15 MED ORDER — ASPIRIN 81 MG PO CHEW
324.0000 mg | CHEWABLE_TABLET | Freq: Once | ORAL | Status: AC
  Administered 2014-06-15: 324 mg via ORAL
  Filled 2014-06-15: qty 4

## 2014-06-15 MED ORDER — ALPRAZOLAM 1 MG PO TABS
1.0000 mg | ORAL_TABLET | Freq: Three times a day (TID) | ORAL | Status: DC | PRN
  Administered 2014-06-18 – 2014-06-25 (×9): 1 mg via ORAL
  Filled 2014-06-15 (×8): qty 1

## 2014-06-15 MED ORDER — TRAMADOL HCL 50 MG PO TABS
50.0000 mg | ORAL_TABLET | Freq: Four times a day (QID) | ORAL | Status: DC | PRN
  Administered 2014-06-16 (×3): 100 mg via ORAL
  Filled 2014-06-15 (×3): qty 2

## 2014-06-15 MED ORDER — ETOMIDATE 2 MG/ML IV SOLN
INTRAVENOUS | Status: AC
Start: 2014-06-15 — End: 2014-06-16
  Filled 2014-06-15: qty 20

## 2014-06-15 MED ORDER — BUDESONIDE 0.25 MG/2ML IN SUSP: 0.2500 mg | Freq: Four times a day (QID) | RESPIRATORY_TRACT | Status: DC

## 2014-06-15 MED ORDER — IPRATROPIUM BROMIDE 0.02 % IN SOLN
RESPIRATORY_TRACT | Status: AC
  Administered 2014-06-15: 0.5 mg
  Filled 2014-06-15: qty 2.5

## 2014-06-15 MED ORDER — ROCURONIUM BROMIDE 50 MG/5ML IV SOLN
INTRAVENOUS | Status: AC
  Filled 2014-06-15: qty 2

## 2014-06-15 MED ORDER — LEVOFLOXACIN IN D5W 750 MG/150ML IV SOLN
750.0000 mg | INTRAVENOUS | Status: DC
  Administered 2014-06-16 – 2014-06-17 (×2): 750 mg via INTRAVENOUS
  Filled 2014-06-15 (×3): qty 150

## 2014-06-15 MED ORDER — SUCCINYLCHOLINE CHLORIDE 20 MG/ML IJ SOLN
INTRAMUSCULAR | Status: AC
Start: 2014-06-15 — End: 2014-06-16
  Filled 2014-06-15: qty 1

## 2014-06-15 MED ORDER — FUROSEMIDE 10 MG/ML IJ SOLN
40.0000 mg | Freq: Once | INTRAMUSCULAR | Status: AC
  Administered 2014-06-15: 40 mg via INTRAVENOUS
  Filled 2014-06-15: qty 4

## 2014-06-15 MED ORDER — METOPROLOL SUCCINATE ER 50 MG PO TB24
50.0000 mg | ORAL_TABLET | Freq: Every day | ORAL | Status: DC
  Administered 2014-06-16 – 2014-06-26 (×11): 50 mg via ORAL
  Filled 2014-06-15 (×4): qty 2
  Filled 2014-06-15: qty 1
  Filled 2014-06-15 (×7): qty 2

## 2014-06-15 MED ORDER — INSULIN ASPART 100 UNIT/ML ~~LOC~~ SOLN
0.0000 [IU] | SUBCUTANEOUS | Status: DC
  Administered 2014-06-16: 3 [IU] via SUBCUTANEOUS

## 2014-06-15 MED ORDER — VANCOMYCIN HCL 10 G IV SOLR
2500.0000 mg | INTRAVENOUS | Status: AC
  Administered 2014-06-15: 2500 mg via INTRAVENOUS
  Filled 2014-06-15: qty 2500

## 2014-06-15 MED ORDER — POLYETHYLENE GLYCOL 3350 17 G PO PACK
17.0000 g | PACK | Freq: Every day | ORAL | Status: DC
  Administered 2014-06-16 – 2014-06-25 (×9): 17 g via ORAL
  Filled 2014-06-15 (×11): qty 1

## 2014-06-15 MED ORDER — HEPARIN SODIUM (PORCINE) 5000 UNIT/ML IJ SOLN: 5000.0000 [IU] | Freq: Three times a day (TID) | INTRAMUSCULAR | Status: DC

## 2014-06-15 MED ORDER — DIVALPROEX SODIUM 500 MG PO DR TAB
1000.0000 mg | DELAYED_RELEASE_TABLET | Freq: Every day | ORAL | Status: DC
  Administered 2014-06-16 – 2014-06-25 (×11): 1000 mg via ORAL
  Filled 2014-06-15 (×12): qty 2

## 2014-06-15 MED ORDER — RIVAROXABAN 15 MG PO TABS
15.0000 mg | ORAL_TABLET | Freq: Two times a day (BID) | ORAL | Status: DC
  Filled 2014-06-15 (×3): qty 1

## 2014-06-15 MED ORDER — VANCOMYCIN HCL 10 G IV SOLR
1500.0000 mg | Freq: Two times a day (BID) | INTRAVENOUS | Status: DC
  Administered 2014-06-16 – 2014-06-17 (×4): 1500 mg via INTRAVENOUS
  Filled 2014-06-15 (×5): qty 1500

## 2014-06-15 MED ORDER — ALBUTEROL SULFATE (2.5 MG/3ML) 0.083% IN NEBU
2.5000 mg | INHALATION_SOLUTION | RESPIRATORY_TRACT | Status: DC | PRN
Start: 2014-06-15 — End: 2014-06-26

## 2014-06-15 MED ORDER — SODIUM CHLORIDE 0.9 % IV SOLN
INTRAVENOUS | Status: AC
  Administered 2014-06-15: via INTRAVENOUS

## 2014-06-15 MED ORDER — IPRATROPIUM BROMIDE 0.02 % IN SOLN: 0.5000 mg | Freq: Four times a day (QID) | RESPIRATORY_TRACT | Status: DC

## 2014-06-15 MED ORDER — IPRATROPIUM BROMIDE 0.02 % IN SOLN: 0.5000 mg | Freq: Once | RESPIRATORY_TRACT | Status: AC

## 2014-06-15 MED ORDER — AMIODARONE HCL 200 MG PO TABS
200.0000 mg | ORAL_TABLET | Freq: Every day | ORAL | Status: DC
  Administered 2014-06-16 – 2014-06-26 (×11): 200 mg via ORAL
  Filled 2014-06-15 (×11): qty 1

## 2014-06-15 MED ORDER — BUPROPION HCL ER (XL) 300 MG PO TB24
300.0000 mg | ORAL_TABLET | Freq: Every day | ORAL | Status: DC
  Administered 2014-06-16 – 2014-06-26 (×11): 300 mg via ORAL
  Filled 2014-06-15 (×11): qty 1

## 2014-06-15 MED ORDER — LIDOCAINE HCL (CARDIAC) 20 MG/ML IV SOLN
INTRAVENOUS | Status: AC
  Filled 2014-06-15: qty 5

## 2014-06-15 MED ORDER — ALBUTEROL (5 MG/ML) CONTINUOUS INHALATION SOLN
INHALATION_SOLUTION | RESPIRATORY_TRACT | Status: AC
  Filled 2014-06-15: qty 20

## 2014-06-15 MED ORDER — DEXTROSE 5 % IV SOLN
1.0000 g | Freq: Once | INTRAVENOUS | Status: AC
  Administered 2014-06-15: 1 g via INTRAVENOUS
  Filled 2014-06-15 (×2): qty 1

## 2014-06-15 MED ORDER — BUDESONIDE 0.25 MG/2ML IN SUSP
0.2500 mg | Freq: Four times a day (QID) | RESPIRATORY_TRACT | Status: DC
  Administered 2014-06-16 – 2014-06-23 (×29): 0.25 mg via RESPIRATORY_TRACT
  Filled 2014-06-15 (×30): qty 2

## 2014-06-15 MED ORDER — DOCUSATE SODIUM 100 MG PO CAPS
100.0000 mg | ORAL_CAPSULE | Freq: Two times a day (BID) | ORAL | Status: DC | PRN
  Filled 2014-06-15: qty 1

## 2014-06-15 MED ORDER — ALBUTEROL (5 MG/ML) CONTINUOUS INHALATION SOLN
10.0000 mg/h | INHALATION_SOLUTION | Freq: Once | RESPIRATORY_TRACT | Status: AC
  Administered 2014-06-15: 10 mg/h via RESPIRATORY_TRACT

## 2014-06-15 MED ORDER — ACETAMINOPHEN 650 MG RE SUPP
975.0000 mg | Freq: Once | RECTAL | Status: AC
  Administered 2014-06-15: 975 mg via RECTAL
  Filled 2014-06-15: qty 1

## 2014-06-15 NOTE — ED Notes (Signed)
Pt lactic acid result= 2.29, MD notified

## 2014-06-15 NOTE — ED Notes (Signed)
Called floor to give report. Nurse not available.

## 2014-06-15 NOTE — ED Provider Notes (Signed)
CSN: [id number]     Arrival date & time 06/15/14  1816 History   First MD Initiated Contact with Patient 06/15/14 1839     Chief Complaint  Patient presents with  . Shortness of Breath  . Code Sepsis     (Consider location/radiation/quality/duration/timing/severity/associated sxs/prior Treatment) HPI The patient has been developing chest pain since last Friday which was almost 4 days ago now. Patient reports has been continuous in nature. His entire chest aches in his uncomfortable. The patient reports in the past week had these symptoms it's been asthma or pneumonia. The patient reports he has had cough and subjective fever. He does describe some productive sputum with cough. He also endorses chest pain worse with coughing. Patient has been short of breath. It has gotten significantly worse or with course of today. Patient didn't notice any particularly increased swelling of his lower legs. He denies calf pain. He denies history of pulmonary embolus or MI. He does report he has had problems with age of fibrillation and congestive heart failure. At this point the patient is too short of breath for even mild exertion.  Past Medical History  Diagnosis Date  . CHF (congestive heart failure)   . HTN (hypertension)   . Ptosis   . Atrial flutter   . Atrial fibrillation   . Morbid obesity   . DM2 (diabetes mellitus, type 2)   . Depression   . Respiratory failure   . Chronic low back pain   . Diabetes mellitus   . Asthma   . Vision abnormalities   . Abscess     right thigh   Past Surgical History  Procedure Laterality Date  . Tonsillectomy    . Cholecystectomy    . Tonsillectomy    . Irrigation and debridement abscess  10/13/2011    Procedure: IRRIGATION AND DEBRIDEMENT ABSCESS;  Surgeon: Emelia Loron, MD;  Location: [facility] OR;  Service: General;  Laterality: Right;  Irrigation and debridement right thigh abscess  . Application of wound vac      right thigh abscess   Family History   Problem Relation Age of Onset  . Coronary artery disease Mother     66s; father had it in 32s; both deceased in their 70s    . Cancer Mother     liver  . Heart disease Father   . Cancer Father     prostate   History  Substance Use Topics  . Smoking status: Never Smoker   . Smokeless tobacco: Not on file     Comment: no significant tobacco use   . Alcohol Use: Yes     Comment: no significant use-- rare    Review of Systems  10 Systems reviewed and are negative for acute change except as noted in the HPI.   Allergies  Iodine and Shellfish allergy  Home Medications   Prior to Admission medications   Medication Sig Start Date End Date Taking? Authorizing Provider  albuterol (VENTOLIN HFA) 108 (90 BASE) MCG/ACT inhaler Inhale 2 puffs into the lungs every 6 (six) hours as needed. Shortness of breath 03/30/14   Alison Murray, MD  ALPRAZolam Prudy Feeler) 1 MG tablet Take 1 tablet (1 mg total) by mouth 3 (three) times daily as needed for anxiety. 03/30/14   Alison Murray, MD  amiodarone (PACERONE) 200 MG tablet Take 200 mg by mouth daily.    Historical Provider, MD  amLODipine (NORVASC) 10 MG tablet Take 10 mg by mouth daily.  11/13/11  Historical Provider, MD  buPROPion (WELLBUTRIN XL) 300 MG 24 hr tablet Take 300 mg by mouth Daily.  03/13/12   Historical Provider, MD  cyclobenzaprine (FLEXERIL) 10 MG tablet Take 10 mg by mouth 3 (three) times daily as needed for muscle spasms.    Historical Provider, MD  divalproex (DEPAKOTE) 500 MG DR tablet Take 1,000 mg by mouth at bedtime.     Historical Provider, MD  docusate sodium 100 MG CAPS Take 100 mg by mouth 2 (two) times daily as needed for mild constipation. 03/30/14   Alison Murray, MD  HUMALOG MIX 75/25 (75-25) 100 UNIT/ML SUSP Inject 40-75 Units into the skin Three times a day before meals. Takes 75 units before breakfast Takes 40 units before lunch Takes 65 units before supper 03/13/12   Historical Provider, MD  hydrALAZINE (APRESOLINE) 10  MG tablet Take 1 tablet (10 mg total) by mouth every 8 (eight) hours. 03/30/14   Alison Murray, MD  HYDROcodone-acetaminophen (NORCO/VICODIN) 5-325 MG per tablet Take 1-2 tablets by mouth every 4 (four) hours as needed for moderate pain. 03/30/14   Alison Murray, MD  lamoTRIgine (LAMICTAL) 100 MG tablet Take 100 mg by mouth daily.  10/23/11   Historical Provider, MD  levalbuterol Pauline Aus) 1.25 MG/0.5ML nebulizer solution Take 1.25 mg by nebulization every 6 (six) hours as needed for wheezing or shortness of breath. 03/30/14   Alison Murray, MD  metoprolol (TOPROL-XL) 50 MG 24 hr tablet Take 50 mg by mouth daily. 05/10/11   Lewayne Bunting, MD  nitroGLYCERIN (NITROSTAT) 0.4 MG SL tablet Place 0.4 mg under the tongue once.    Historical Provider, MD  Rivaroxaban (XARELTO) 15 MG TABS tablet Take 1 tablet (15 mg total) by mouth 2 (two) times daily with a meal. 03/30/14   Alison Murray, MD  traMADol (ULTRAM) 50 MG tablet Take 50-100 mg by mouth every 6 (six) hours as needed for pain.    Historical Provider, MD   BP 158/95  Pulse 106  Temp(Src) 103.1 F (39.5 C) (Rectal)  Resp 35  SpO2 95% Physical Exam Constitutional: Patient is a morbidly obese male with moderate to severe respiratory distress. His mental status however is clear. He is speaking in short oriented sentences. Patient is mildly diaphoretic. Head face: Normocephalic atraumatic Eyes patient appears to have a baseline strabismus. The extraocular motions are however intact. Oral cavity mucous membranes are pink moist. Neck: Morbidly obese but supple Chest: Patient has poor air flow to the lung bases. he has both crackle and wheeze most pronounced on the right lung field. Wheezes also present in the left lung base. Air flow is to the mid fields. Abdomen: Morbidly obese nontender. Extremities: Patient does not endorse any calf tenderness. The legs are morbidly obese and appreciating edema versus obesity is compromised. He does however appear to  have edema as well likely 2+. The general skin good condition however is fair without ulcers or acute cellulitis Neurologic: The patient is alert and oriented x3. He is getting appropriate and meaningful history. He is following all commands appropriately to sit forward for examination and go through positioning for IV starts, EKG etc. there are no focal motor deficits or focal cognitive deficits at this point. Skin: Patient is warm slightly flushed and mildly diaphoretic. ED Course  Procedures (including critical care time) Labs Review Labs Reviewed  CBC WITH DIFFERENTIAL - Abnormal; Notable for the following:    WBC 16.7 (*)    Neutro Abs 12.9 (*)  Monocytes Absolute 1.3 (*)    All other components within normal limits  COMPREHENSIVE METABOLIC PANEL - Abnormal; Notable for the following:    Glucose, Bld 127 (*)    Total Protein 8.6 (*)    Albumin 3.3 (*)    GFR calc non Af Amer 81 (*)    All other components within normal limits  BLOOD GAS, ARTERIAL - Abnormal; Notable for the following:    pO2, Arterial 57.0 (*)    Bicarbonate 27.3 (*)    Acid-Base Excess 2.6 (*)    All other components within normal limits  I-STAT CG4 LACTIC ACID, ED - Abnormal; Notable for the following:    Lactic Acid, Venous 2.29 (*)    All other components within normal limits  CULTURE, BLOOD (ROUTINE X 2)  CULTURE, BLOOD (ROUTINE X 2)  APTT  PRO B NATRIURETIC PEPTIDE  PROTIME-INR  MAGNESIUM  PHOSPHORUS  URINALYSIS, ROUTINE W REFLEX MICROSCOPIC  I-STAT TROPOININ, ED  I-STAT ARTERIAL BLOOD GAS, ED    Imaging Review Dg Chest Port 1 View  06/15/2014   CLINICAL DATA:  Respiratory distress.  EXAM: PORTABLE CHEST - 1 VIEW  COMPARISON:  03/25/2014.  FINDINGS: Grossly stable enlarged cardiac silhouette. Interval extensive, dense airspace opacity throughout the majority of the right lung. Lesser degree of airspace opacity in the left lung. No acute bony abnormality.  IMPRESSION: 1. Interval extensive, dense  right lung pneumonia or alveolar edema. 2. Lesser degree of left lung pneumonia or alveolar edema. 3. Stable cardiomegaly.   Electronically Signed   By: Gordan Payment M.D.   On: 06/15/2014 19:38     EKG Interpretation None     The intensivist has been consult and the physician extender will be coming down to the emergency department to evaluate the patient for admission to the ICU (20:15)  CRITICAL CARE Performed by: Arby Barrette   Total critical care time: 60  Critical care time was exclusive of separately billable procedures and treating other patients.  Critical care was necessary to treat or prevent imminent or life-threatening deterioration.  Critical care was time spent personally by me on the following activities: development of treatment plan with patient and/or surrogate as well as nursing, discussions with consultants, evaluation of patient's response to treatment, examination of patient, obtaining history from patient or surrogate, ordering and performing treatments and interventions, ordering and review of laboratory studies, ordering and review of radiographic studies, pulse oximetry an.  I have reassessed the patient on multiple occasions throughout the course of his stay in the emergency department tod re-evaluation of patient's condition. The patient arrived significantly hypoxic in the upper 60s. He did respond to supplemental oxygen and DuoNeb therapy. The patient was placed on BiPAP for respiratory support. Patient has maintained a intact sensorium with appropriate mental status throughout this period. To this point he has not shown signs of obtundation or declining respiratory response on BiPAP but would necessitate intubation. The patient however has high risk of respiratory failure with morbid obesity, extensive opacifications and probable additional edema on the chest x-ray. The patient will be admitted to the ICU with high risk for decompensating pneumonia and  sepsis.  MDM   Final diagnoses:  Systemic inflammatory response syndrome (SIRS)  Community acquired pneumonia  Obesity, morbid, BMI 50 or higher    Medical decision making as per above.    Arby Barrette, MD 06/15/14 2024

## 2014-06-15 NOTE — Progress Notes (Signed)
ED nurse called for report 2 minutes after patient pended, I was currently unable to take report.  After reviewing pt's chart called ED to get report 15 minutes after patient pended, ED nurse unable to give report.  Gave number to secretary [phone] for nurse to call when ready.  Nickola Major

## 2014-06-15 NOTE — Progress Notes (Signed)
ANTIBIOTIC CONSULT NOTE - INITIAL  Pharmacy Consult for Vancomycin Indication: pneumonia  Allergies  Allergen Reactions  . Iodine Anaphylaxis and Other (See Comments)    Associated with the shellfish allergy  . Shellfish Allergy Anaphylaxis    Patient Measurements:    Vital Signs: Temp: 103.1 F (39.5 C) (09/14 1925) Temp src: Rectal (09/14 1925) BP: 158/95 mmHg (09/14 1916) Pulse Rate: 106 (09/14 1916) Intake/Output from previous day:   Intake/Output from this shift:    Labs:  Recent Labs  06/15/14 1840  WBC 16.7*  HGB 14.2  PLT 261   The CrCl is unknown because both a height and weight (above a minimum accepted value) are required for this calculation. No results found for this basename: VANCOTROUGH, VANCOPEAK, VANCORANDOM, GENTTROUGH, GENTPEAK, GENTRANDOM, TOBRATROUGH, TOBRAPEAK, TOBRARND, AMIKACINPEAK, AMIKACINTROU, AMIKACIN,  in the last 72 hours   Microbiology: No results found for this or any previous visit (from the past 720 hour(s)).  Medical History: Past Medical History  Diagnosis Date  . CHF (congestive heart failure)   . HTN (hypertension)   . Ptosis   . Atrial flutter   . Atrial fibrillation   . Morbid obesity   . DM2 (diabetes mellitus, type 2)   . Depression   . Respiratory failure   . Chronic low back pain   . Diabetes mellitus   . Asthma   . Vision abnormalities   . Abscess     right thigh    Assessment: 43 yo obese male presents with chest pain and shortness of breath with cough and fever, CXR shows pneumonia, code sepsis called.  Cefepime x 1 ordered by MD and pharmacy consulted to dose vancomycin.    Tmax: 103.1 WBC: elevated Renal: SCr 1.10, CrCl~87 ml/min (normalized) Lactic acid: 2.29 Blood cultures sent.  Goal of Therapy:  Vancomycin trough level 15-20 mcg/ml Doses adjusted per renal function Eradication of infection  Plan:  1.  Vancomycin 2500 mg IV loading dose x 1 then 1500 mg IV q12h. 2.  F/u SCr, trough at  steady state, culture results, clinical course. 3.  F/u plan for subsequent doses of Cefepime.  Clance Boll 06/15/2014,7:30 PM

## 2014-06-15 NOTE — Progress Notes (Signed)
  Amiodarone Drug - Drug Interaction Consult Note  Recommendations: CCM Amiodarone is metabolized by the cytochrome P450 system and therefore has the potential to cause many drug interactions. Amiodarone has an average plasma half-life of 50 days (range 20 to 100 days).   There is potential for drug interactions to occur several weeks or months after stopping treatment and the onset of drug interactions may be slow after initiating amiodarone.    Statins: Increased risk of myopathy. Simvastatin- restrict dose to  daily. Other statins: counsel patients to report any muscle pain or weakness immediately.   Anticoagulants: Amiodarone can increase anticoagulant effect. Consider warfarin dose reduction. Patients should be monitored closely and the dose of anticoagulant altered accordingly, remembering that amiodarone levels take several weeks to stabilize.   Antiepileptics: Amiodarone can increase plasma concentration of phenytoin, the dose should be reduced. Note that small changes in phenytoin dose can result in large changes in levels. Monitor patient and counsel on signs of toxicity.   Beta blockers: increased risk of bradycardia, AV block and myocardial depression. Sotalol - avoid concomitant use.    Calcium channel blockers (diltiazem and verapamil): increased risk of bradycardia, AV block and myocardial depression.    Cyclosporine: Amiodarone increases levels of cyclosporine. Reduced dose of cyclosporine is recommended.   Digoxin dose should be halved when amiodarone is started.   Diuretics: increased risk of cardiotoxicity if hypokalemia occurs.   Oral hypoglycemic agents (glyburide, glipizide, glimepiride): increased risk of hypoglycemia. Patient's glucose levels should be monitored closely when initiating amiodarone therapy.    Drugs that prolong the QT interval:  Torsades de pointes risk may be increased with concurrent use - avoid if possible.  Monitor QTc,  also keep magnesium/potassium WNL if concurrent therapy can't be avoided. Marland Kitchen Antibiotics: e.g. fluoroquinolones, erythromycin. . Antiarrhythmics: e.g. quinidine, procainamide, disopyramide, sotalol. . Antipsychotics: e.g. phenothiazines, haloperidol.  . Lithium, tricyclic antidepressants, and methadone.  Pt started on Levaquin current QT/QTc= 371/493.  K=4.3, Mag not checked   Thank Bonita Quin  Lorenza Evangelist  06/15/2014 11:20 PM

## 2014-06-15 NOTE — H&P (Signed)
PULMONARY / CRITICAL CARE MEDICINE   Name: [name] MRN: [id number] DOB: [birth year]    ADMISSION DATE:  06/15/2014  INITIAL PRESENTATION: Morbidly obese 68 M  with presumed CAP and acute resp failure. High risk of intubation. Adm by PCCM from ED to ICU for PRN NPPV, abx  STUDIES:    SIGNIFICANT EVENTS: 9/14 admission: Morbidly obese 23 M  with presumed CAP and acute resp failure. High risk of intubation. Adm by PCCM from ED to ICU for PRN NPPV, abx  LINES/TUBES:  MICRO: Blood 9/14 >>   PCT 9/14:  ,  9/15:    9/16:    ABX:   HISTORY OF PRESENT ILLNESS:   Pleasant 70 M with extensive PMH mostly stemming from morbid obesity. Admitted via [facility] ED with acute hypoxic resp failure and extensive R>L AS dz on CXR. On the evening prior to admission, he noted fever for which he took ibuprofen, He awoke on the morning of admission with severe dyspnea and retrosternal CP both of which were unabated with use of his BD MDI. Therefore he came to [facility] ED where his exam revealed fever to 103, severe dyspnea. CXR revealed extensive consolidation throughout R lung with patchy infiltrate on L. He was started on BiPAP and PCCM called to admit  PAST MEDICAL HISTORY :  Past Medical History  Diagnosis Date  . CHF (congestive heart failure)   . HTN (hypertension)   . Ptosis   . Atrial flutter   . Atrial fibrillation   . Morbid obesity   . DM2 (diabetes mellitus, type 2)   . Depression   . Respiratory failure   . Chronic low back pain   . Diabetes mellitus   . Asthma   . Vision abnormalities   . Abscess     right thigh   Past Surgical History  Procedure Laterality Date  . Tonsillectomy    . Cholecystectomy    . Tonsillectomy    . Irrigation and debridement abscess  10/13/2011    Procedure: IRRIGATION AND DEBRIDEMENT ABSCESS;  Surgeon: Emelia Loron, MD;  Location: [facility] OR;  Service: General;  Laterality: Right;  Irrigation and debridement right thigh abscess  . Application of wound vac       right thigh abscess   Prior to Admission medications   Medication Sig Start Date End Date Taking? Authorizing Provider  albuterol (VENTOLIN HFA) 108 (90 BASE) MCG/ACT inhaler Inhale 2 puffs into the lungs every 6 (six) hours as needed. Shortness of breath 03/30/14  Yes Alison Murray, MD  ALPRAZolam Prudy Feeler) 1 MG tablet Take 1 tablet (1 mg total) by mouth 3 (three) times daily as needed for anxiety. 03/30/14  Yes Alison Murray, MD  amiodarone (PACERONE) 200 MG tablet Take 200 mg by mouth daily.   Yes Historical Provider, MD  amLODipine (NORVASC) 10 MG tablet Take 10 mg by mouth daily.  11/13/11  Yes Historical Provider, MD  buPROPion (WELLBUTRIN XL) 300 MG 24 hr tablet Take 300 mg by mouth Daily.  03/13/12  Yes Historical Provider, MD  cyclobenzaprine (FLEXERIL) 10 MG tablet Take 10 mg by mouth 3 (three) times daily as needed for muscle spasms.   Yes Historical Provider, MD  divalproex (DEPAKOTE) 500 MG DR tablet Take 1,000 mg by mouth at bedtime.    Yes Historical Provider, MD  docusate sodium 100 MG CAPS Take 100 mg by mouth 2 (two) times daily as needed for mild constipation. 03/30/14  Yes Alison Murray, MD  Yolande Jolly  MIX 75/25 (75-25) 100 UNIT/ML SUSP Inject 40-75 Units into the skin Three times a day before meals. Takes 75 units before breakfast Takes 40 units before lunch Takes 65 units before supper 03/13/12  Yes Historical Provider, MD  hydrALAZINE (APRESOLINE) 10 MG tablet Take 1 tablet (10 mg total) by mouth every 8 (eight) hours. 03/30/14  Yes Alison Murray, MD  HYDROcodone-acetaminophen (NORCO/VICODIN) 5-325 MG per tablet Take 1-2 tablets by mouth every 4 (four) hours as needed for moderate pain. 03/30/14  Yes Alison Murray, MD  levalbuterol Pauline Aus) 1.25 MG/0.5ML nebulizer solution Take 1.25 mg by nebulization every 6 (six) hours as needed for wheezing or shortness of breath. 03/30/14  Yes Alison Murray, MD  metoprolol (TOPROL-XL) 50 MG 24 hr tablet Take 50 mg by mouth daily. 05/10/11  Yes Lewayne Bunting, MD  nitroGLYCERIN (NITROSTAT) 0.4 MG SL tablet Place 0.4 mg under the tongue once.   Yes Historical Provider, MD  polyethylene glycol (MIRALAX / GLYCOLAX) packet Take 17 g by mouth daily.   Yes Historical Provider, MD  Rivaroxaban (XARELTO) 15 MG TABS tablet Take 1 tablet (15 mg total) by mouth 2 (two) times daily with a meal. 03/30/14  Yes Alison Murray, MD  traMADol (ULTRAM) 50 MG tablet Take 50-100 mg by mouth every 6 (six) hours as needed for pain.   Yes Historical Provider, MD  lamoTRIgine (LAMICTAL) 100 MG tablet Take 100 mg by mouth daily.  10/23/11   Historical Provider, MD   Allergies  Allergen Reactions  . Iodine Anaphylaxis and Other (See Comments)    Associated with the shellfish allergy  . Shellfish Allergy Anaphylaxis    FAMILY HISTORY:  Family History  Problem Relation Age of Onset  . Coronary artery disease Mother     54s; father had it in 21s; both deceased in their 64s    . Cancer Mother     liver  . Heart disease Father   . Cancer Father     prostate   SOCIAL HISTORY:  reports that he has never smoked. He does not have any smokeless tobacco history on file. He reports that he drinks alcohol. He reports that he does not use illicit drugs.  REVIEW OF SYSTEMS: + cough, no purulent sputum, no hemoptysis, no N/V/D, dysuria. No LE edema or calf tenderness  SUBJECTIVE:   VITAL SIGNS: Temp:  [103.1 F (39.5 C)] 103.1 F (39.5 C) (09/14 1925) Pulse Rate:  [100-109] 103 (09/14 2111) Resp:  [12-42] 17 (09/14 2111) BP: (99-170)/(44-95) 109/53 mmHg (09/14 2111) SpO2:  [95 %-100 %] 100 % (09/14 2111) FiO2 (%):  [50 %] 50 % (09/14 1919) HEMODYNAMICS:   VENTILATOR SETTINGS: Vent Mode:  [-] BIPAP;PCV FiO2 (%):  [50 %] 50 % Set Rate:  [15 bmp] 15 bmp PEEP:  [8 cmH20] 8 cmH20 INTAKE / OUTPUT: No intake or output data in the 24 hours ending 06/15/14 2131  PHYSICAL EXAMINATION: General: Very obese, speaks in full sentences off BiPAP, cognition intact Neuro:  No focal deficits noted HEENT: NCAT, EOMI, PERRL Cardiovascular: RRR, distant HS, no M Lungs: no wheezes anteriorly, unable to sit up Abdomen: very obese, NT, BS not heard Ext: symmetric brawny edema of B feet  LABS:  CBC  Recent Labs Lab 06/15/14 1840  WBC 16.7*  HGB 14.2  HCT 42.3  PLT 261   Coag's  Recent Labs Lab 06/15/14 1840  APTT 35   BMET  Recent Labs Lab 06/15/14 1840  NA 140  K 4.3  CL 100  CO2 28  BUN 18  CREATININE 1.10  GLUCOSE 127*   Electrolytes  Recent Labs Lab 06/15/14 1840  CALCIUM 9.3   Sepsis Markers  Recent Labs Lab 06/15/14 1856  LATICACIDVEN 2.29*   ABG  Recent Labs Lab 06/15/14 1842  PHART 7.404  PCO2ART 44.6  PO2ART 57.0*   Liver Enzymes  Recent Labs Lab 06/15/14 1840  AST 15  ALT 9  ALKPHOS 70  BILITOT 0.9  ALBUMIN 3.3*   CXR: Extensive consolidation on R. Patchy infiltrate on L  ASSESSMENT / PLAN:  PULMONARY A: Acute hypoxic resp failure Likely CAP H/O asthma P:   ICU admission Supp O2 to maintain SpO2 88-95% Nebulized BDs Abx as below  CARDIOVASCULAR  A:  PAF/flutter NSR presently Chronic beta blocker use P:  Cont home metoprolol Cont rivaroxaban Monitor  RENAL A:   No issues P:   Monitor BMET intermittently Monitor I/Os Correct electrolytes as indicated  GASTROINTESTINAL A:   Morbid obesity P:   NPO until resp status improved SUP not indicated unless intubated  HEMATOLOGIC A:   NO issues P:  DVT px: full dose rivaroxaban (For PAF) Monitor CBC intermittently Transfuse per usual ICU guidelines  INFECTIOUS A:   CAP, NOS P:   Blood 9/14 >>  Strep Ag 9/14 >>  Legionella Ag 9/14 >>   Vanc 9/14 >>  Levofloxacin 9/14 >>   ENDOCRINE A:  DM 2 P:   Hold long acting insulin until taking POs Resistant scale SSI q 4 hrs Change SSI to ACHS and begin long acting after diet started  NEUROLOGIC A:  ICU associated discomfort  P:   RASS goal: 0 Cont home dose of  PRN tramadol  TODAY'S SUMMARY:   I have personally obtained a history, examined the patient, evaluated laboratory and imaging results, formulated the assessment and plan and placed orders. CRITICAL CARE: The patient is critically ill with multiple organ systems failure and requires high complexity decision making for assessment and support, frequent evaluation and titration of therapies, application of advanced monitoring technologies and extensive interpretation of multiple databases. Critical Care Time devoted to patient care services described in this note is  minutes.   Billy Fischer, MD ; [facility] [phone].  After 5:30 PM or weekends, call [phone]  06/15/2014, 9:31 PM

## 2014-06-15 NOTE — Progress Notes (Signed)
  CARE MANAGEMENT ED NOTE 06/15/2014  Patient:  [name], [name]   Account Number:  [id number]  Date Initiated:  06/15/2014  Documentation initiated by:  Radford Pax  Subjective/Objective Assessment:   Patient presents to Ed with chest pain, fever, cough with productive sputum, and shortness of breath.     Subjective/Objective Assessment Detail:   Patient with pmhx of CHF, HTN, DM, afib/flutter, asthma, obesity     Action/Plan:   Action/Plan Detail:   Anticipated DC Date:       Status Recommendation to Physician:   Result of Recommendation:    Other ED Services  Consult Working Plan    DC Planning Services  CM consult  Other    Choice offered to / List presented to:            Status of service:  Completed, signed off  ED Comments:   ED Comments Detail:  EDCM spoke to patient at bedside.  Patient reports he lives [name] his cousin and a room mate.  Patient does not have any home health services currently but he is active with [facility] per patient.  "The  nurse comes out once a month.  She was just at my house on friday and will be back on October 15th."  Patient reports he has a Museum/gallery exhibitions officer and glucometer at home. Patient also wears a CPAP machine at night at home. Patient noted to have a foley catheter in place which was placed in the ED, not from home.  Patient would benefit from dietary consult, diabetic coordinator consult. EDCM placed consult to [facility] to make them aware of patient's admission. no further EDCM needs at this time.

## 2014-06-15 NOTE — ED Notes (Signed)
Pt to triage room, O2 sats read 59%, HR 122, RN to room, pt placed on NRB, and transported to resus B. Unable to obtain EKG at this time.

## 2014-06-16 ENCOUNTER — Inpatient Hospital Stay (HOSPITAL_COMMUNITY): Payer: Medicare Other

## 2014-06-16 DIAGNOSIS — J45901 Unspecified asthma with (acute) exacerbation: Secondary | ICD-10-CM

## 2014-06-16 LAB — GLUCOSE, CAPILLARY
GLUCOSE-CAPILLARY: 109 mg/dL — AB (ref 70–99)
GLUCOSE-CAPILLARY: 134 mg/dL — AB (ref 70–99)
GLUCOSE-CAPILLARY: 147 mg/dL — AB (ref 70–99)
GLUCOSE-CAPILLARY: 191 mg/dL — AB (ref 70–99)
Glucose-Capillary: 119 mg/dL — ABNORMAL HIGH (ref 70–99)
Glucose-Capillary: 171 mg/dL — ABNORMAL HIGH (ref 70–99)
Glucose-Capillary: 127 mg/dL — ABNORMAL HIGH (ref 70–99)

## 2014-06-16 LAB — CBC
HCT: 37.1 % — ABNORMAL LOW (ref 39.0–52.0)
Hemoglobin: 12.1 g/dL — ABNORMAL LOW (ref 13.0–17.0)
MCH: 27.6 pg (ref 26.0–34.0)
MCHC: 32.6 g/dL (ref 30.0–36.0)
MCV: 84.7 fL (ref 78.0–100.0)
Platelets: 247 10*3/uL (ref 150–400)
RBC: 4.38 MIL/uL (ref 4.22–5.81)
RDW: 14.8 % (ref 11.5–15.5)
WBC: 14.5 10*3/uL — ABNORMAL HIGH (ref 4.0–10.5)

## 2014-06-16 LAB — BASIC METABOLIC PANEL
Anion gap: 9 (ref 5–15)
BUN: 20 mg/dL (ref 6–23)
CHLORIDE: 102 meq/L (ref 96–112)
CO2: 29 meq/L (ref 19–32)
Calcium: 8.5 mg/dL (ref 8.4–10.5)
Creatinine, Ser: 1.31 mg/dL (ref 0.50–1.35)
GFR calc Af Amer: 76 mL/min — ABNORMAL LOW (ref >90)
GFR calc non Af Amer: 65 mL/min — ABNORMAL LOW (ref >90)
Glucose, Bld: 119 mg/dL — ABNORMAL HIGH (ref 70–99)
POTASSIUM: 4.4 meq/L (ref 3.7–5.3)
Sodium: 140 mEq/L (ref 137–147)

## 2014-06-16 LAB — LEGIONELLA ANTIGEN, URINE: Legionella Antigen, Urine: NEGATIVE

## 2014-06-16 LAB — STREP PNEUMONIAE URINARY ANTIGEN: Strep Pneumo Urinary Antigen: NEGATIVE

## 2014-06-16 LAB — PROCALCITONIN: Procalcitonin: 0.19 ng/mL

## 2014-06-16 LAB — MAGNESIUM: MAGNESIUM: 2.1 mg/dL (ref 1.5–2.5)

## 2014-06-16 LAB — MRSA PCR SCREENING: MRSA by PCR: INVALID — AB

## 2014-06-16 MED ORDER — RIVAROXABAN 20 MG PO TABS
20.0000 mg | ORAL_TABLET | Freq: Every day | ORAL | Status: DC
  Administered 2014-06-16 – 2014-06-25 (×10): 20 mg via ORAL
  Filled 2014-06-16 (×12): qty 1

## 2014-06-16 MED ORDER — INSULIN ASPART 100 UNIT/ML ~~LOC~~ SOLN
0.0000 [IU] | Freq: Every day | SUBCUTANEOUS | Status: DC
  Administered 2014-06-19 – 2014-06-20 (×2): 2 [IU] via SUBCUTANEOUS
  Administered 2014-06-21 – 2014-06-23 (×3): 3 [IU] via SUBCUTANEOUS
  Administered 2014-06-24: 4 [IU] via SUBCUTANEOUS
  Administered 2014-06-25: 2 [IU] via SUBCUTANEOUS

## 2014-06-16 MED ORDER — FENTANYL CITRATE 0.05 MG/ML IJ SOLN
50.0000 ug | Freq: Once | INTRAMUSCULAR | Status: AC
  Administered 2014-06-16: 50 ug via INTRAVENOUS
  Filled 2014-06-16: qty 2

## 2014-06-16 MED ORDER — ACETAMINOPHEN 325 MG PO TABS
650.0000 mg | ORAL_TABLET | Freq: Once | ORAL | Status: AC
  Administered 2014-06-16: 650 mg via ORAL
  Filled 2014-06-16: qty 2

## 2014-06-16 MED ORDER — INSULIN ASPART 100 UNIT/ML ~~LOC~~ SOLN
0.0000 [IU] | Freq: Three times a day (TID) | SUBCUTANEOUS | Status: DC
Start: 2014-06-16 — End: 2014-06-26
  Administered 2014-06-16: 4 [IU] via SUBCUTANEOUS
  Administered 2014-06-16 – 2014-06-17 (×2): 3 [IU] via SUBCUTANEOUS
  Administered 2014-06-17: 4 [IU] via SUBCUTANEOUS
  Administered 2014-06-17: 3 [IU] via SUBCUTANEOUS
  Administered 2014-06-18 (×2): 4 [IU] via SUBCUTANEOUS
  Administered 2014-06-19: 7 [IU] via SUBCUTANEOUS
  Administered 2014-06-19 (×2): 4 [IU] via SUBCUTANEOUS
  Administered 2014-06-20: 7 [IU] via SUBCUTANEOUS
  Administered 2014-06-20: 3 [IU] via SUBCUTANEOUS
  Administered 2014-06-20: 11 [IU] via SUBCUTANEOUS
  Administered 2014-06-21 (×2): 7 [IU] via SUBCUTANEOUS
  Administered 2014-06-21 – 2014-06-22 (×2): 4 [IU] via SUBCUTANEOUS
  Administered 2014-06-22: 15 [IU] via SUBCUTANEOUS
  Administered 2014-06-22: 7 [IU] via SUBCUTANEOUS
  Administered 2014-06-23: 11 [IU] via SUBCUTANEOUS
  Administered 2014-06-23 – 2014-06-24 (×3): 7 [IU] via SUBCUTANEOUS
  Administered 2014-06-24: 4 [IU] via SUBCUTANEOUS
  Administered 2014-06-24: 11 [IU] via SUBCUTANEOUS
  Administered 2014-06-25: 7 [IU] via SUBCUTANEOUS
  Administered 2014-06-25: 4 [IU] via SUBCUTANEOUS
  Administered 2014-06-25: 7 [IU] via SUBCUTANEOUS
  Administered 2014-06-26: 15 [IU] via SUBCUTANEOUS

## 2014-06-16 MED ORDER — INFLUENZA VAC SPLIT QUAD 0.5 ML IM SUSY
0.5000 mL | PREFILLED_SYRINGE | INTRAMUSCULAR | Status: AC
  Administered 2014-06-17: 0.5 mL via INTRAMUSCULAR
  Filled 2014-06-16 (×2): qty 0.5

## 2014-06-16 NOTE — Progress Notes (Signed)
ANTIBIOTIC CONSULT NOTE - INITIAL  Pharmacy Consult for Levaquin Indication: CAP  Allergies  Allergen Reactions  . Iodine Anaphylaxis and Other (See Comments)    Associated with the shellfish allergy  . Shellfish Allergy Anaphylaxis    Patient Measurements: Height:  (190.5 cm) Weight: 599 lb 10.4 oz (272 kg) IBW/kg (Calculated) : 84.5   Vital Signs: Temp: 99.3 F (37.4 C) (09/15 0000) Temp src: Core (Comment) (09/15 0000) BP: 112/67 mmHg (09/15 0000) Pulse Rate: 82 (09/15 0000) Intake/Output from previous day: 09/14 0701 - 09/15 0700 In: 725 [I.V.:75; IV Piggyback:650] Out: 775 [Urine:775] Intake/Output from this shift: Total I/O In: 725 [I.V.:75; IV Piggyback:650] Out: 775 [Urine:775]  Labs:  Recent Labs  06/15/14 1840  WBC 16.7*  HGB 14.2  PLT 261  CREATININE 1.10   Estimated Creatinine Clearance: 195.3 ml/min (by C-G formula based on Cr of 1.1). No results found for this basename: VANCOTROUGH, VANCOPEAK, VANCORANDOM, GENTTROUGH, GENTPEAK, GENTRANDOM, TOBRATROUGH, TOBRAPEAK, TOBRARND, AMIKACINPEAK, AMIKACINTROU, AMIKACIN,  in the last 72 hours   Microbiology: No results found for this or any previous visit (from the past 720 hour(s)).  Medical History: Past Medical History  Diagnosis Date  . CHF (congestive heart failure)   . HTN (hypertension)   . Ptosis   . Atrial flutter   . Atrial fibrillation   . Morbid obesity   . DM2 (diabetes mellitus, type 2)   . Depression   . Respiratory failure   . Chronic low back pain   . Diabetes mellitus   . Asthma   . Vision abnormalities   . Abscess     right thigh    Medications:  Scheduled:  . sodium chloride   Intravenous STAT  . amiodarone  200 mg Oral Daily  . budesonide  0.25 mg Nebulization Q6H  . buPROPion  300 mg Oral Daily  . divalproex  1,000 mg Oral QHS  . etomidate      . insulin aspart  0-20 Units Subcutaneous 6 times per day  . ipratropium-albuterol  3 mL Nebulization Q6H  .  levofloxacin (LEVAQUIN) IV  750 mg Intravenous Q24H  . lidocaine (cardiac) 100 mg/31ml      . metoprolol succinate  50 mg Oral Daily  . polyethylene glycol  17 g Oral Daily  . Rivaroxaban  15 mg Oral BID WC  . rocuronium      . succinylcholine      . vancomycin  1,500 mg Intravenous Q12H   Infusions:   Assessment: 42yoM with presumed CAP and acute resp failure. Levaquin per Rx.   Goal of Therapy:  Treat infection  Plan:   Levaquin  IV q24h  F/U SCr/cultures as needed  Susanne Greenhouse R 06/16/2014,1:24 AM

## 2014-06-16 NOTE — Progress Notes (Signed)
   Low grade fever  Plan  Tylenol x 1  Dr. Kalman Shan, M.D., [facility].C.P Pulmonary and Critical Care Medicine Staff Physician [facility] [place] Pulmonary and Critical Care Pager: [phone], If no answer or between  15:00h - 7:00h: call [phone]  06/16/2014 6:46 PM

## 2014-06-16 NOTE — Progress Notes (Signed)
Pt currently off bipap on NRB at 15L.

## 2014-06-16 NOTE — Progress Notes (Signed)
PULMONARY / CRITICAL CARE MEDICINE   Name: Evan [name] MRN: [id number] DOB: 1971/08/19    ADMISSION DATE:  06/15/2014  INITIAL PRESENTATION: Morbidly obese 26 Evan [name] and acute resp failure. High risk of intubation. Adm by PCCM from ED to ICU for PRN NPPV, abx  STUDIES:    SIGNIFICANT EVENTS: 9/14 admission: Morbidly obese 76 Evan [name] and acute resp failure. High risk of intubation. Adm by PCCM from ED to ICU for PRN NPPV, abx  LINES/TUBES:  MICRO: Blood 9/14 >>   PCT 9/14:  ,  9/15: 0.19   9/16:    ABX: 9/14 leva>> 9/14 vanc>>  HISTORY OF PRESENT ILLNESS:   Evan [name] with extensive PMH mostly stemming from morbid obesity. Admitted via [facility] ED with acute hypoxic resp failure and extensive R>L AS dz on CXR. On the evening prior to admission, he noted fever for which he took ibuprofen, He awoke on the morning of admission with severe dyspnea and retrosternal CP both of which were unabated with use of his BD MDI. Therefore he came to [facility] ED where his exam revealed fever to 103, severe dyspnea. CXR revealed extensive consolidation throughout R lung with patchy infiltrate on L. He was started on BiPAP and PCCM called to admit   SUBJECTIVE:  Awake and alert. Remains on NRB at 70% VITAL SIGNS: Temp:  [98.4 F (36.9 C)-103.1 F (39.5 C)] 99.9 F (37.7 C) (09/15 0900) Pulse Rate:  [80-109] 94 (09/15 0900) Resp:  [12-42] 18 (09/15 0900) BP: (98-170)/(44-95) 135/72 mmHg (09/15 0900) SpO2:  [91 %-100 %] 91 % (09/15 0900) FiO2 (%):  [50 %-100 %] 100 % (09/15 0844) Weight:  [599 lb 10.4 oz (272 kg)] 599 lb 10.4 oz (272 kg) (09/14 2315) HEMODYNAMICS:   VENTILATOR SETTINGS: Vent Mode:  [-] BIPAP;PCV FiO2 (%):  [50 %-100 %] 100 % Set Rate:  [15 bmp] 15 bmp PEEP:  [8 cmH20] 8 cmH20 INTAKE / OUTPUT:  Intake/Output Summary (Last 24 hours) at 06/16/14 4098 Last data filed at 06/16/14 0900  Gross per 24 hour  Intake 1674.17 ml  Output   1665 ml  Net    9.17 ml    PHYSICAL EXAMINATION: General: Very obese, speaks in full sentences off BiPAP, cognition intact Neuro: No focal deficits noted. HEENT: NCAT, EOMI, PERRL Cardiovascular: RRR, distant HS, no [name] Lungs: no wheezes anteriorly,  Abdomen: very obese, NT, BS not heard Ext: symmetric brawny edema of Bilat feet  LABS:  CBC  Recent Labs Lab 06/15/14 1840 06/16/14 0345  WBC 16.7* 14.5*  HGB 14.2 12.1*  HCT 42.3 37.1*  PLT 261 247   Coag's  Recent Labs Lab 06/15/14 1840  APTT 35   BMET  Recent Labs Lab 06/15/14 1840 06/16/14 0345  NA 140 140  K 4.3 4.4  CL 100 102  CO2 28 29  BUN 18 20  CREATININE 1.10 1.31  GLUCOSE 127* 119*   Electrolytes  Recent Labs Lab 06/15/14 1840 06/16/14 0018 06/16/14 0345  CALCIUM 9.3  --  8.5  MG  --  2.1  --    Sepsis Markers  Recent Labs Lab 06/15/14 1840 06/15/14 1856 06/16/14 0350  LATICACIDVEN  --  2.29*  --   PROCALCITON 0.21  --  0.19   ABG  Recent Labs Lab 06/15/14 1842  PHART 7.404  PCO2ART 44.6  PO2ART 57.0*   Liver Enzymes  Recent Labs Lab 06/15/14 1840  AST 15  ALT 9  ALKPHOS 70  BILITOT 0.9  ALBUMIN 3.3*   CXR: Extensive consolidation on R. Patchy infiltrate on L 9/15 improved but rt side consolidation remains ASSESSMENT / PLAN:  PULMONARY A: Acute hypoxic resp failure Likely [name] H/O asthma OSA P:   ICU admission Supp O2 to maintain SpO2 88-95% Nebulized BDs Abx as below Cpap nocturnally 16 cmH2O  CARDIOVASCULAR  A:  PAF/flutter NSR presently Chronic beta blocker use P:  Cont home metoprolol Cont rivaroxaban > agree with change to  daily.  Monitor  RENAL A:   No issues P:   Monitor BMET intermittently Monitor I/Os Correct electrolytes as indicated  GASTROINTESTINAL A:   Morbid obesity P:   Start diet on 9/15 SUP not indicated unless intubated  HEMATOLOGIC A:   NO issues P:  DVT px: full dose rivaroxaban (For PAF) Monitor CBC  intermittently Transfuse per usual ICU guidelines  INFECTIOUS A:   [name], NOS P:   Blood 9/14 >>  Strep Ag 9/14 >>  Legionella Ag 9/14 >>   Vanc 9/14 >>  Levofloxacin 9/14 >>   ENDOCRINE A:  DM 2 P:   Hold long acting insulin until taking POs Resistant scale SSI q 4 hrs Change SSI to ACHS and begin long acting after diet started  NEUROLOGIC A:  ICU associated discomfort but A and O today 9/15 P:   RASS goal: 1 Cont home dose of PRN tramadol  TODAY'S SUMMARY:   Better after 24 hours. Continue O2 , abx, cpap qhs (16cm H20)  Brett Canales Minor ACNP [facility] PCCM Pager [phone] till 3 pm If no answer page [phone] 06/16/2014, 9:53 AM  35 minutes CC time  Levy Pupa, MD, PhD 06/16/2014, 10:56 AM [place] Pulmonary and Critical Care [phone] or if no answer [phone]

## 2014-06-17 ENCOUNTER — Ambulatory Visit (INDEPENDENT_AMBULATORY_CARE_PROVIDER_SITE_OTHER): Payer: Medicare Other | Admitting: Surgery

## 2014-06-17 ENCOUNTER — Inpatient Hospital Stay (HOSPITAL_COMMUNITY): Payer: Medicare Other

## 2014-06-17 DIAGNOSIS — J189 Pneumonia, unspecified organism: Secondary | ICD-10-CM | POA: Diagnosis not present

## 2014-06-17 DIAGNOSIS — G4733 Obstructive sleep apnea (adult) (pediatric): Secondary | ICD-10-CM

## 2014-06-17 LAB — BLOOD GAS, ARTERIAL
ACID-BASE EXCESS: 5.1 mmol/L — AB (ref 0.0–2.0)
Acid-Base Excess: 4.6 mmol/L — ABNORMAL HIGH (ref 0.0–2.0)
Bicarbonate: 30.7 mEq/L — ABNORMAL HIGH (ref 20.0–24.0)
Bicarbonate: 32 mEq/L — ABNORMAL HIGH (ref 20.0–24.0)
DRAWN BY: 11249
Drawn by: 11249
FIO2: 0.6 %
FIO2: 0.6 %
LHR: 15 {breaths}/min
MODE: POSITIVE
Mode: POSITIVE
O2 SAT: 96.3 %
O2 Saturation: 94 %
PATIENT TEMPERATURE: 38.4
PEEP/CPAP: 6 cmH2O
PEEP: 6 cmH2O
PH ART: 7.315 — AB (ref 7.350–7.450)
PRESSURE CONTROL: 14 cmH2O
Patient temperature: 39.6
Pressure control: 14 cmH2O
RATE: 15 resp/min
TCO2: 27.9 mmol/L (ref 0–100)
TCO2: 29.4 mmol/L (ref 0–100)
pCO2 arterial: 62.4 mmHg (ref 35.0–45.0)
pCO2 arterial: 66 mmHg (ref 35.0–45.0)
pH, Arterial: 7.328 — ABNORMAL LOW (ref 7.350–7.450)
pO2, Arterial: 99.9 mmHg (ref 80.0–100.0)
pO2, Arterial: 80.6 mmHg (ref 80.0–100.0)

## 2014-06-17 LAB — GLUCOSE, CAPILLARY
GLUCOSE-CAPILLARY: 142 mg/dL — AB (ref 70–99)
GLUCOSE-CAPILLARY: 169 mg/dL — AB (ref 70–99)
GLUCOSE-CAPILLARY: 124 mg/dL — AB (ref 70–99)
Glucose-Capillary: 129 mg/dL — ABNORMAL HIGH (ref 70–99)
Glucose-Capillary: 206 mg/dL — ABNORMAL HIGH (ref 70–99)

## 2014-06-17 LAB — BASIC METABOLIC PANEL
Anion gap: 9 (ref 5–15)
Anion gap: 12 (ref 5–15)
BUN: 14 mg/dL (ref 6–23)
BUN: 15 mg/dL (ref 6–23)
CO2: 30 mEq/L (ref 19–32)
CO2: 27 mEq/L (ref 19–32)
CREATININE: 1.13 mg/dL (ref 0.50–1.35)
Calcium: 8.5 mg/dL (ref 8.4–10.5)
Calcium: 9 mg/dL (ref 8.4–10.5)
Chloride: 103 mEq/L (ref 96–112)
Chloride: 99 mEq/L (ref 96–112)
Creatinine, Ser: 1.05 mg/dL (ref 0.50–1.35)
GFR calc Af Amer: >90 mL/min (ref >90)
GFR calc non Af Amer: 85 mL/min — ABNORMAL LOW (ref >90)
GFR calc non Af Amer: 78 mL/min — ABNORMAL LOW (ref >90)
GLUCOSE: 173 mg/dL — AB (ref 70–99)
Glucose, Bld: 143 mg/dL — ABNORMAL HIGH (ref 70–99)
POTASSIUM: 5 meq/L (ref 3.7–5.3)
POTASSIUM: 4.9 meq/L (ref 3.7–5.3)
Sodium: 142 mEq/L (ref 137–147)
Sodium: 138 mEq/L (ref 137–147)

## 2014-06-17 LAB — CBC
HCT: 37 % — ABNORMAL LOW (ref 39.0–52.0)
Hemoglobin: 11.6 g/dL — ABNORMAL LOW (ref 13.0–17.0)
MCH: 27.5 pg (ref 26.0–34.0)
MCHC: 31.4 g/dL (ref 30.0–36.0)
MCV: 87.7 fL (ref 78.0–100.0)
Platelets: 226 10*3/uL (ref 150–400)
RBC: 4.22 MIL/uL (ref 4.22–5.81)
RDW: 14.9 % (ref 11.5–15.5)
WBC: 15 10*3/uL — ABNORMAL HIGH (ref 4.0–10.5)

## 2014-06-17 LAB — PROCALCITONIN
Procalcitonin: <0.1 ng/mL
Procalcitonin: <0.1 ng/mL

## 2014-06-17 LAB — MAGNESIUM: Magnesium: 2.2 mg/dL (ref 1.5–2.5)

## 2014-06-17 LAB — LACTIC ACID, PLASMA: LACTIC ACID, VENOUS: 1.1 mmol/L (ref 0.5–2.2)

## 2014-06-17 LAB — PHOSPHORUS: PHOSPHORUS: 3.6 mg/dL (ref 2.3–4.6)

## 2014-06-17 LAB — VANCOMYCIN, TROUGH: Vancomycin Tr: 10 ug/mL (ref 10.0–20.0)

## 2014-06-17 MED ORDER — ACETAMINOPHEN 325 MG PO TABS
650.0000 mg | ORAL_TABLET | Freq: Once | ORAL | Status: AC
  Administered 2014-06-17: 650 mg via ORAL
  Filled 2014-06-17: qty 2

## 2014-06-17 MED ORDER — DEXTROSE 5 % IV SOLN
2.0000 g | Freq: Three times a day (TID) | INTRAVENOUS | Status: DC
  Administered 2014-06-17 – 2014-06-22 (×15): 2 g via INTRAVENOUS
  Filled 2014-06-17 (×17): qty 2

## 2014-06-17 MED ORDER — SODIUM CHLORIDE 0.9 % IV SOLN
1500.0000 mg | Freq: Three times a day (TID) | INTRAVENOUS | Status: DC
  Administered 2014-06-18 (×3): 1500 mg via INTRAVENOUS
  Filled 2014-06-17 (×5): qty 1500

## 2014-06-17 MED ORDER — FUROSEMIDE 10 MG/ML IJ SOLN
40.0000 mg | Freq: Two times a day (BID) | INTRAMUSCULAR | Status: AC
  Administered 2014-06-17 (×2): 40 mg via INTRAVENOUS
  Filled 2014-06-17 (×2): qty 4

## 2014-06-17 NOTE — Progress Notes (Signed)
   1. Fever 102.56F   Recent Labs Lab 06/15/14 1840 06/15/14 1856 06/16/14 0350 06/17/14 0413  LATICACIDVEN  --  2.29*  --   --   PROCALCITON 0.21  --  0.19 <0.10    Recent Labs Lab 06/15/14 1840 06/16/14 0345 06/17/14 0413  HGB 14.2 12.1* 11.6*  HCT 42.3 37.1* 37.0*  WBC 16.7* 14.5* 15.0*  PLT 261 247 226    Anti-infectives   Start     Dose/Rate Route Frequency Ordered Stop   06/17/14 1200  ceFEPIme (MAXIPIME) 2 g in dextrose 5 % 50 mL IVPB     2 g 100 mL/hr over 30 Minutes Intravenous Every 8 hours 06/17/14 1040     06/16/14 0800  vancomycin (VANCOCIN) 1,500 mg in sodium chloride 0.9 % 500 mL IVPB     1,500 mg 250 mL/hr over 120 Minutes Intravenous Every 12 hours 06/15/14 1949     06/15/14 2359  levofloxacin (LEVAQUIN) IVPB 750 mg  Status:  Discontinued     750 mg 100 mL/hr over 90 Minutes Intravenous Every 24 hours 06/15/14 2323 06/17/14 1035   06/15/14 2000  ceFEPIme (MAXIPIME) 1 g in dextrose 5 % 50 mL IVPB     1 g 100 mL/hr over 30 Minutes Intravenous  Once 06/15/14 1914 06/15/14 2019   06/15/14 1945  vancomycin (VANCOCIN) 2,500 mg in sodium chloride 0.9 % 500 mL IVPB     2,500 mg 250 mL/hr over 120 Minutes Intravenous STAT 06/15/14 1936 06/15/14 2250       2. Mopre sleep and better with bipap after 1h; he has difficulty keeping bipap on   Recent Labs Lab 06/15/14 1842 06/17/14 2057  PHART 7.404 7.328*  PCO2ART 44.6 62.4*  PO2ART 57.0* 99.9  HCO3 27.3* 30.7*  TCO2 24.2 27.9  O2SAT 88.9 96.3    A Fever    - Rx tylenol - recheck lactate - repeat blood culture - repeat PCT profile  B. Resp acidosi Recheck abg in 2h - continue through bipap through night Avoid benzoe and opioids (not on it) Dc tramadol   Dr. Kalman Shan, M.D., [facility].C.P Pulmonary and Critical Care Medicine Staff Physician [facility] Pager: [phone], If no answer or between  15:00h - 7:00h: call [phone]  06/17/2014 9:20 PM

## 2014-06-17 NOTE — Progress Notes (Signed)
PULMONARY / CRITICAL CARE MEDICINE   Name: [name] MRN: [id number] DOB: April 18, 1971    ADMISSION DATE:  06/15/2014  INITIAL PRESENTATION: Morbidly obese 65 M  with presumed CAP and acute resp failure. High risk of intubation. Adm by PCCM from ED to ICU for PRN NPPV, abx  STUDIES:    SIGNIFICANT EVENTS: 9/14 admission: Morbidly obese 48 M  with presumed CAP and acute resp failure. High risk of intubation. Adm by PCCM from ED to ICU for PRN NPPV, abx 9/16 worsening aeration on c x r and high fio2 needs refractory to current tx. Try diuresis.   LINES/TUBES:  MICRO: Blood 9/14 >>   PCT 9/14:  ,  9/15: 0.19   9/16:   <0.10 ABX: 9/14 leva>> 9/14 vanc>>  HISTORY OF PRESENT ILLNESS:   Pleasant 66 M with extensive PMH mostly stemming from morbid obesity. Admitted via [facility] ED with acute hypoxic resp failure and extensive R>L AS dz on CXR. On the evening prior to admission, he noted fever for which he took ibuprofen, He awoke on the morning of admission with severe dyspnea and retrosternal CP both of which were unabated with use of his BD MDI. Therefore he came to [facility] ED where his exam revealed fever to 103, severe dyspnea. CXR revealed extensive consolidation throughout R lung with patchy infiltrate on L. He was started on BiPAP and PCCM called to admit   SUBJECTIVE:  Awake and alert. Remains on NRB at 70%. Tolerated cpap at night. VITAL SIGNS: Temp:  [98.2 F (36.8 C)-100.8 F (38.2 C)] 100.6 F (38.1 C) (09/16 0900) Pulse Rate:  [84-99] 99 (09/16 0900) Resp:  [22-33] 22 (09/16 0900) BP: (113-173)/(54-156) 173/156 mmHg (09/16 0900) SpO2:  [76 %-97 %] 76 % (09/16 0900) FiO2 (%):  [60 %-100 %] 100 % (09/16 0842) HEMODYNAMICS:   VENTILATOR SETTINGS: Vent Mode:  [-] BIPAP FiO2 (%):  [60 %-100 %] 100 % Set Rate:  [15 bmp] 15 bmp PEEP:  [8 cmH20] 8 cmH20 INTAKE / OUTPUT:  Intake/Output Summary (Last 24 hours) at 06/17/14 0948 Last data filed at 06/17/14 4098  Gross per 24 hour   Intake    650 ml  Output   4300 ml  Net  -3650 ml    PHYSICAL EXAMINATION: General: Very obese, speaks in full sentences off BiPAP, reports congestion Neuro: No focal deficits noted. HEENT: NCAT, EOMI, PERRL Cardiovascular: RRR, distant HS, no M, SR on monitor Lungs: no wheezes anteriorly, diminished in bsaes Abdomen: very obese, NT, BS decreased Ext: symmetric brawny edema of Bilat feet  LABS:  CBC  Recent Labs Lab 06/15/14 1840 06/16/14 0345 06/17/14 0413  WBC 16.7* 14.5* 15.0*  HGB 14.2 12.1* 11.6*  HCT 42.3 37.1* 37.0*  PLT 261 247 226   Coag's  Recent Labs Lab 06/15/14 1840  APTT 35   BMET  Recent Labs Lab 06/15/14 1840 06/16/14 0345 06/17/14 0413  NA 140 140 142  K 4.3 4.4 5.0  CL 100 102 103  CO2 BUN CREATININE 1.10 1.31 1.05  GLUCOSE 127* 119* 143*   Electrolytes  Recent Labs Lab 06/15/14 1840 06/16/14 0018 06/16/14 0345 06/17/14 0413  CALCIUM 9.3  --  8.5 8.5  MG  --  2.1  --  2.2  PHOS  --   --   --  3.6   Sepsis Markers  Recent Labs Lab 06/15/14 1840 06/15/14 1856 06/16/14 0350 06/17/14 0413  LATICACIDVEN  --  2.29*  --   --   PROCALCITON 0.21  --  0.19 <0.10   ABG  Recent Labs Lab 06/15/14 1842  PHART 7.404  PCO2ART 44.6  PO2ART 57.0*   Liver Enzymes  Recent Labs Lab 06/15/14 1840  AST 15  ALT 9  ALKPHOS 70  BILITOT 0.9  ALBUMIN 3.3*   CXR: Extensive consolidation on R . Patchy infiltrate on L 9/16 c x r:  rt consolidation worse suspect pna >edema ASSESSMENT / PLAN:  PULMONARY A: Acute hypoxic resp failure Likely CAP H/O asthma OSA P:   ICU admission Supp O2 to maintain SpO2 88-95% Nebulized BDs Abx as below Cpap nocturnally 16 cmH2O Try to mobilize 9/16 Diuresis as tolerated He may need intubation in future  CARDIOVASCULAR  A:  PAF/flutter NSR presently Chronic beta blocker use P:  Cont home metoprolol Cont rivaroxaban >  change to  daily.   Monitor  RENAL A:   No issues P:   Monitor BMET intermittently Monitor I/Os Correct electrolytes as indicated  GASTROINTESTINAL A:   Morbid obesity P:   Start diet on 9/15 SUP not indicated unless intubated  HEMATOLOGIC A:   NO issues P:  DVT px: full dose rivaroxaban (For PAF) >  daily Monitor CBC intermittently Transfuse per usual ICU guidelines  INFECTIOUS A:   CAP, NOS P:   Blood 9/14 >>  Strep Ag 9/14 >> neg Legionella Ag 9/14 >> neg  Vanc 9/14 >>  Levofloxacin 9/14 >>   ENDOCRINE CBG (last 3)   Recent Labs  06/16/14 2325 06/17/14 0401 06/17/14 0821  GLUCAP 127* 129* 142*     A:  DM 2 P:   Hold long acting insulin until taking POs Resistant scale SSI q 4 hrs Change SSI to ACHS and begin long acting after diet started. 9/16 on advanced diet but SSI holding him in control. Hold off on novolog 70/30 till cbg rise.  NEUROLOGIC A:  ICU associated discomfort but A and O today 9/15 P:   RASS goal: 1 Cont home dose of PRN tramadol  TODAY'S SUMMARY:  OSA/OHS with acute on chronic resp failure, probable CAP. Consider also component volume overload. Using BiPAP prn, remains at risk for MV. Attempting to mobilize, minimize sedating meds. Empiric diuresis on 9/16  [facility] Minor ACNP Adolph Pollack PCCM Pager [phone] till 3 pm If no answer page [phone]  35 minutes CC time  Levy Pupa, MD, PhD 06/17/2014, 9:48 AM [place] [facility] [phone] or if no answer [phone]

## 2014-06-17 NOTE — Progress Notes (Signed)
Requested Tylenol for temp. Advised to watch for now, [facility]

## 2014-06-17 NOTE — Progress Notes (Addendum)
ANTIBIOTIC CONSULT NOTE - Follow Up  Pharmacy Consult for Vancomycin/Levaquin --> Vanc/Cefepime Indication: pneumonia  Allergies  Allergen Reactions  . Iodine Anaphylaxis and Other (See Comments)    Associated with the shellfish allergy  . Shellfish Allergy Anaphylaxis    Patient Measurements: Height:  (190.5 cm) Weight: 599 lb 10.4 oz (272 kg) IBW/kg (Calculated) : 84.5  Vital Signs: Temp: 100.6 F (38.1 C) (09/16 0900) Temp src: Oral (09/16 0000) BP: 173/156 mmHg (09/16 0900) Pulse Rate: 99 (09/16 0900) Intake/Output from previous day: 09/15 0701 - 09/16 0700 In: 1299.2 [I.V.:149.2; IV Piggyback:1150] Out: 4525 [Urine:4525] Intake/Output from this shift:    Labs:  Recent Labs  06/15/14 1840 06/16/14 0345 06/17/14 0413  WBC 16.7* 14.5* 15.0*  HGB 14.2 12.1* 11.6*  PLT 261 247 226  CREATININE 1.10 1.31 1.05   Estimated Creatinine Clearance: 204.6 ml/min (by C-G formula based on Cr of 1.05). No results found for this basename: VANCOTROUGH, VANCOPEAK, VANCORANDOM, GENTTROUGH, GENTPEAK, GENTRANDOM, TOBRATROUGH, TOBRAPEAK, TOBRARND, AMIKACINPEAK, AMIKACINTROU, AMIKACIN,  in the last 72 hours    Assessment: 43 yo obese male presents with chest pain and shortness of breath with cough and fever, CXR shows pneumonia, code sepsis called.  Pharmacy consulted to dose vancomycin and Levaquin for presumed CAP.  9/14 >> Cefepime >>x1 ED 9/14 >> Vanc >> 9/15 >> Levaquin >> 9/16 9/16 >> Cefepime >>  Tmax: 100.6 WBC: remains elelvated Renal: SCr improved to 1.05, CrCl ~91 ml/min (normalized) LA: 2.29 PCT trended down to <0.10  9/14 blood x 2: ngtd 9/14 Ur Strep Ag: neg 9/14 Ur Legionella Ag: neg 9/15 MRSA PCR in process  Today is day #3 vancomycin 2500 mg IV x 1 then 1500 mg IV q12h and day #2 Levaquin 750 mg q24hr for sepsis, pneumonia source.  Noted drug interaction with amiodarone and levaquin may result in prolonged QT and risk of developing Torsades (Category  X drug interaction).  Interaction was communicated to CCM who gave verbal order to change Levaquin to Cefepime.  Goal of Therapy:  Vancomycin trough level 15-20 mcg/ml Doses adjusted per renal function Eradication of infection  Plan:  1.  Check vancomycin trough level tonight at steady state. 2.  Cefepime 2g IV q8h. 3.  F/u daily.  Clance Boll 06/17/2014,10:17 AM

## 2014-06-17 NOTE — Progress Notes (Signed)
PHARMACY BRIEF NOTE - Drug Level Result  Consult for:  Vancomycin Indication:   Pneumonia  The current dose of Vancomycin is 1500 mg IV every 12 hours.  The steady-state trough level drawn prior to the 8 pm dose tonight is reported as 10 mcg/ml.  This level is below the desired range, 15-20 mcg/ml.  Plan: Increase the Vancomycin dose to 1500 mg IV every 8 hours.  Polo Riley R.Ph. 06/17/2014 9:33 PM

## 2014-06-18 ENCOUNTER — Inpatient Hospital Stay (HOSPITAL_COMMUNITY): Payer: Medicare Other

## 2014-06-18 LAB — BASIC METABOLIC PANEL
ANION GAP: 8 (ref 5–15)
BUN: 15 mg/dL (ref 6–23)
CALCIUM: 8.6 mg/dL (ref 8.4–10.5)
CO2: 32 mEq/L (ref 19–32)
Chloride: 99 mEq/L (ref 96–112)
Creatinine, Ser: 1.12 mg/dL (ref 0.50–1.35)
GFR calc Af Amer: >90 mL/min (ref >90)
GFR calc non Af Amer: 79 mL/min — ABNORMAL LOW (ref >90)
GLUCOSE: 158 mg/dL — AB (ref 70–99)
Potassium: 4.6 mEq/L (ref 3.7–5.3)
SODIUM: 139 meq/L (ref 137–147)

## 2014-06-18 LAB — BLOOD GAS, ARTERIAL
ACID-BASE EXCESS: 6.1 mmol/L — AB (ref 0.0–2.0)
Bicarbonate: 32.9 mEq/L — ABNORMAL HIGH (ref 20.0–24.0)
Drawn by: 11249
FIO2: 1 %
O2 Saturation: 92.2 %
PATIENT TEMPERATURE: 38.8
TCO2: 30.1 mmol/L (ref 0–100)
pCO2 arterial: 66.4 mmHg (ref 35.0–45.0)
pH, Arterial: 7.326 — ABNORMAL LOW (ref 7.350–7.450)
pO2, Arterial: 75 mmHg — ABNORMAL LOW (ref 80.0–100.0)

## 2014-06-18 LAB — GLUCOSE, CAPILLARY
GLUCOSE-CAPILLARY: 189 mg/dL — AB (ref 70–99)
GLUCOSE-CAPILLARY: 178 mg/dL — AB (ref 70–99)
Glucose-Capillary: 153 mg/dL — ABNORMAL HIGH (ref 70–99)

## 2014-06-18 LAB — CBC
HCT: 37 % — ABNORMAL LOW (ref 39.0–52.0)
Hemoglobin: 11.8 g/dL — ABNORMAL LOW (ref 13.0–17.0)
MCH: 27.8 pg (ref 26.0–34.0)
MCHC: 31.9 g/dL (ref 30.0–36.0)
MCV: 87.1 fL (ref 78.0–100.0)
PLATELETS: 249 10*3/uL (ref 150–400)
RBC: 4.25 MIL/uL (ref 4.22–5.81)
RDW: 14.7 % (ref 11.5–15.5)
WBC: 14.4 10*3/uL — ABNORMAL HIGH (ref 4.0–10.5)

## 2014-06-18 LAB — PHOSPHORUS: Phosphorus: 3.4 mg/dL (ref 2.3–4.6)

## 2014-06-18 LAB — VANCOMYCIN, TROUGH: Vancomycin Tr: 17.6 ug/mL (ref 10.0–20.0)

## 2014-06-18 LAB — MAGNESIUM: Magnesium: 2.1 mg/dL (ref 1.5–2.5)

## 2014-06-18 LAB — PROCALCITONIN: Procalcitonin: <0.1 ng/mL

## 2014-06-18 LAB — APTT: aPTT: 57 seconds — ABNORMAL HIGH (ref 24–37)

## 2014-06-18 MED ORDER — SODIUM CHLORIDE 0.9 % IJ SOLN: 10.0000 mL | INTRAMUSCULAR | Status: DC | PRN

## 2014-06-18 MED ORDER — FUROSEMIDE 10 MG/ML IJ SOLN
40.0000 mg | Freq: Two times a day (BID) | INTRAMUSCULAR | Status: AC
  Administered 2014-06-18 (×2): 40 mg via INTRAVENOUS
  Filled 2014-06-18 (×2): qty 4

## 2014-06-18 MED ORDER — SODIUM CHLORIDE 0.9 % IJ SOLN
10.0000 mL | Freq: Two times a day (BID) | INTRAMUSCULAR | Status: DC
  Administered 2014-06-18 – 2014-06-21 (×5): 10 mL
  Administered 2014-06-21: 20 mL
  Administered 2014-06-22 – 2014-06-23 (×3): 10 mL
  Administered 2014-06-23: 20 mL
  Administered 2014-06-25: 10 mL

## 2014-06-18 NOTE — Progress Notes (Signed)
PHARMACY BRIEF NOTE - Drug Level Result  Consult for:  Vancomycin Indication:  Pneumonia  The current dose of Vancomycin is 1500 mg IV every 8 hours.  The trough level drawn at 20:50 tonight is reported as 17.6 mcg/ml.  This level is within the therapeutic range, 15-20 mcg/ml.  Plan: Continue the current Vancomycin dose.  Polo Riley R.Ph. 06/18/2014 10:26 PM

## 2014-06-18 NOTE — Progress Notes (Signed)
Right upper arm PICC insertion attempted without success. Dr. Rafael Bihari aware and orders given to leave as PIV access for now. Reccommended that patient be referred to IR for PICC placement or bedside central line may be needed due to patient size. ICU RN aware not to infuse Vancomycin or caustic medication.

## 2014-06-18 NOTE — Progress Notes (Signed)
Peripherally Inserted Central Catheter/Midline Placement  The IV Nurse has discussed with the patient and/or persons authorized to consent for the patient, the purpose of this procedure and the potential benefits and risks involved with this procedure.  The benefits include less needle sticks, lab draws from the catheter and patient may be discharged home with the catheter.  Risks include, but not limited to, infection, bleeding, blood clot (thrombus formation), and puncture of an artery; nerve damage and irregular heat beat.  Alternatives to this procedure were also discussed.  PICC/Midline Placement Documentation  PICC / Midline Double Lumen 06/18/14 Midline Right Cephalic 12 cm 1 cm (Active)  Indication for Insertion or Continuance of Line Poor Vasculature-patient has had multiple peripheral attempts or PIVs lasting less than 24 hours 06/18/2014  7:00 PM  Exposed Catheter (cm) 1 cm 06/18/2014  7:00 PM  Dressing Change Due 06/25/14 06/18/2014  7:00 PM       Stacie Glaze Horton 06/18/2014, 7:19 PM

## 2014-06-18 NOTE — Progress Notes (Signed)
ANTIBIOTIC CONSULT NOTE - Follow Up  Pharmacy Consult for Vancomycin, Cefepime Indication: pneumonia  Allergies  Allergen Reactions  . Iodine Anaphylaxis and Other (See Comments)    Associated with the shellfish allergy  . Shellfish Allergy Anaphylaxis    Patient Measurements: Height:  (190.5 cm) Weight:  (pt scale is incorrect reported to RN) IBW/kg (Calculated) : 84.5  Vital Signs: Temp: 100.8 F (38.2 C) (09/17 0700) Temp src: Core (Comment) (09/16 2300) BP: 185/76 mmHg (09/17 0700) Pulse Rate: 86 (09/17 0700) Intake/Output from previous day: 09/16 0701 - 09/17 0700 In: 1650 [IV Piggyback:1650] Out: 6800 [Urine:6800] Intake/Output from this shift:    Labs:  Recent Labs  06/16/14 0345 06/17/14 0413 06/17/14 1925 06/18/14 0400  WBC 14.5* 15.0*  --  14.4*  HGB 12.1* 11.6*  --  11.8*  PLT 247 226  --  249  CREATININE 1.31 1.05 1.13 1.12   Estimated Creatinine Clearance: 191.9 ml/min (by C-G formula based on Cr of 1.12).  Recent Labs  06/17/14 1924  VANCOTROUGH 10.0      Assessment: 43 yo obese male presents with chest pain and shortness of breath with cough and fever, CXR shows pneumonia, code sepsis called.  Pharmacy consulted to dose vancomycin and Levaquin for presumed CAP.  Antibiotics then switched to vancomycin and Cefepime to avoid drug interaction with Levaquin and amiodarone.  9/14 >> Cefepime >>x1 ED 9/14 >> Vanc >> 9/15 >>Levaquin >> 9/16 (switched to Cefepime d/t DDI with amio) 9/16 >> Cefepime >>  Tmax: 102 WBC: remains elelvated Renal: SCr 1.12, CrCl ~85 ml/min (normalized) LA: 2.29 > 1.1 PCT trended down to <0.10  9/14 blood x 2: ngtd 9/14 Ur Strep Ag: neg 9/14 Ur Legionella Ag: neg 9/15 MRSA PCR pending  Today is day #4 vancomycin 1500 mg IV q8h and day #2 Cefepime 2g IV q8h for sepsis, pneumonia source.  Vancomycin dose adjusted yesterday based on low trough level.  Goal of Therapy:  Vancomycin trough level 15-20  mcg/ml Doses adjusted per renal function Eradication of infection  Plan:  1.  Check vancomycin trough level tonight at steady state of new dosing. 2.  Continue Cefepime 2g IV q8h. 3.  F/u daily.  Clance Boll 06/18/2014,9:40 AM

## 2014-06-18 NOTE — Progress Notes (Signed)
Patient active with [facility] Care Management services. 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 [facility] Care Management will continue to follow. Came to bedside to speak with patient. However, he was on bipap. Will come back at later time.  Raiford Noble, MSN- RN,BSN- [facility] [phone]

## 2014-06-18 NOTE — Progress Notes (Signed)
   Insert picc line   Dr. Kalman Shan, M.D., [facility].C.P Pulmonary and Critical Care Medicine Staff Physician [facility] Pager: [phone], If no answer or between  15:00h - 7:00h: call [phone]  06/18/2014 4:19 PM

## 2014-06-18 NOTE — Progress Notes (Signed)
eLink Physician-Brief Progress Note Patient Name: HOLMAN BONSIGNORE DOB: 1970-11-02 MRN: [id number]   Date of Service  06/18/2014  HPI/Events of Note  Worsening resp acidosis   eICU Interventions  change bipap setting to EPAP 10, IPAP 18, RR 18recheck ABG at 5am     Intervention Category Intermediate Interventions: Respiratory distress - evaluation and management  Jameelah Watts 06/18/2014, 12:42 AM

## 2014-06-18 NOTE — Evaluation (Signed)
Physical Therapy Evaluation Patient Details Name: [name] MRN: [id number] DOB: [birth year] Today's Date: 06/18/2014   History of Present Illness  Pt is a 43 year old male admitted 06/15/14 with acute hypoxic respiratory failure with hx of morbid obesity.  Clinical Impression  Pt currently with functional limitations due to the deficits listed below (see PT Problem List).  Pt will benefit from skilled PT to increase their independence and safety with mobility to allow discharge to the venue listed below.  Pt reports he goes to [employer] and lives with roommates.  Pt feels he will return to baseline as he states he is usually moving around and on campus without difficulty.  Pt would like for PT to continue to assist with mobility during acute stay.  SpO2 on NRB upon end of ambulation 79% however increased quickly to 95% with rest and pt reports no SOB.     Follow Up Recommendations No PT follow up    Equipment Recommendations  None recommended by PT    Recommendations for Other Services       Precautions / Restrictions Precautions Precautions: Fall Precaution Comments: monitor sats      Mobility  Bed Mobility               General bed mobility comments: pt up in recliner on arrival  Transfers Overall transfer level: Needs assistance Equipment used: Rolling walker (2 wheeled) Transfers: Sit to/from Stand Sit to Stand: Min guard;+2 safety/equipment         General transfer comment: verbal cues for safe technique from bari recliner with numerous lines/leads/oxygen  Ambulation/Gait Ambulation/Gait assistance: Min guard;+2 safety/equipment Ambulation Distance (Feet): 80 Feet Assistive device: Rolling walker (2 wheeled) Gait Pattern/deviations: Step-through pattern;Wide base of support     General Gait Details: pt reports no SOB, verbal cues for pushing down on RW, ambulated on NRB SpO2 79% upon return to room with immediate increase back to 95% upon 2-3 minutes  rest  Stairs            Wheelchair Mobility    Modified Rankin (Stroke Patients Only)       Balance                                             Pertinent Vitals/Pain Pain Assessment: No/denies pain    Home Living Family/patient expects to be discharged to:: Private residence Living Arrangements: Non-relatives/Friends;Other relatives (roommates)   Type of Home: House Home Access: Stairs to enter Entrance Stairs-Rails: Right Entrance Stairs-Number of Steps: 5 Home Layout: One level Home Equipment: Walker - 2 wheels      Prior Function Level of Independence: Independent         Comments: 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     Hand Dominance        Extremity/Trunk Assessment               Lower Extremity Assessment: Generalized weakness         Communication   Communication: No difficulties  Cognition Arousal/Alertness: Awake/alert Behavior During Therapy: WFL for tasks assessed/performed Overall Cognitive Status: Within Functional Limits for tasks assessed                      General Comments      Exercises        Assessment/Plan  PT Assessment Patient needs continued PT services  PT Diagnosis Difficulty walking   PT Problem List Decreased activity tolerance;Cardiopulmonary status limiting activity;Decreased mobility  PT Treatment Interventions Gait training;Stair training;Functional mobility training;Therapeutic activities;Therapeutic exercise;Patient/family education;DME instruction   PT Goals (Current goals can be found in the Care Plan section) Acute Rehab PT Goals PT Goal Formulation: With patient Time For Goal Achievement: 07/02/14 Potential to Achieve Goals: Good    Frequency Min 3X/week   Barriers to discharge        Co-evaluation               End of Session Equipment Utilized During Treatment: Oxygen Activity Tolerance: Patient limited by fatigue (reports tired from no  sleep last night) Patient left: in chair;with call bell/phone within reach           Time: 1430-1447 PT Time Calculation (min): 17 min   Charges:   PT Evaluation $Initial PT Evaluation Tier I: 1 Procedure PT Treatments $Gait Training: 8-22 mins   PT G Codes:          [name],[name] 06/18/2014, 3:03 PM [name], PT, DPT 06/18/2014 Pager: [phone]

## 2014-06-18 NOTE — Progress Notes (Signed)
PULMONARY / CRITICAL CARE MEDICINE   Name: [name] MRN: [id number] DOB: 1971-09-28    ADMISSION DATE:  06/15/2014  INITIAL PRESENTATION: Morbidly obese 48 M  with presumed CAP and acute resp failure. High risk of intubation. Adm by PCCM from ED to ICU for PRN NPPV, abx  STUDIES:    SIGNIFICANT EVENTS: 9/14 admission: Morbidly obese 2 M  with presumed CAP and acute resp failure. High risk of intubation. Adm by PCCM from ED to ICU for PRN NPPV, abx 9/16 worsening aeration on c x r and high fio2 needs refractory to current tx. Try diuresis.   9/16 nocturnal fever 102 with increased resp acidosis.   LINES/TUBES:  MICRO: Blood 9/14 >>  Blood 9/16>>  PCT 9/14:  ,  9/15: 0.19   9/16:   <0.10 ABX: 9/14 leva>>9/16 Cefepime 9/16>> 9/14 vanc>>  HISTORY OF PRESENT ILLNESS:   [name] 51 M with extensive PMH mostly stemming from morbid obesity. Admitted via [facility] ED with acute hypoxic resp failure and extensive R>L AS dz on CXR. On the evening prior to admission, he noted fever for which he took ibuprofen, He awoke on the morning of admission with severe dyspnea and retrosternal CP both of which were unabated with use of his BD MDI. Therefore he came to [facility] ED where his exam revealed fever to 103, severe dyspnea. CXR revealed extensive consolidation throughout R lung with patchy infiltrate on L. He was started on BiPAP and PCCM called to admit   SUBJECTIVE:  Awake and alert. Remains on NRB at 70%. Required change to bipap for increased PCO2 and acidosis.  VITAL SIGNS: Temp:  [100.6 F (38.1 C)-102.9 F (39.4 C)] 100.8 F (38.2 C) (09/17 0700) Pulse Rate:  [82-102] 86 (09/17 0700) Resp:  [18-47] 23 (09/17 0700) BP: (124-185)/(38-156) 185/76 mmHg (09/17 0700) SpO2:  [76 %-98 %] 90 % (09/17 0700) FiO2 (%):  [55 %-100 %] 100 % (09/17 0805) HEMODYNAMICS:   VENTILATOR SETTINGS: Vent Mode:  [-] BIPAP FiO2 (%):  [55 %-100 %] 100 % Set Rate:  [15 bmp-18 bmp] 18 bmp PEEP:  [6  cmH20-10 cmH20] 10 cmH20 INTAKE / OUTPUT:  Intake/Output Summary (Last 24 hours) at 06/18/14 0853 Last data filed at 06/18/14 0444  Gross per 24 hour  Intake   1650 ml  Output   6800 ml  Net  -5150 ml    PHYSICAL EXAMINATION: General: Very obese, speaks in full sentences off BiPAP, looks better than his numbers. Neuro: No focal deficits noted. HEENT: NCAT, EOMI, PERRL Cardiovascular: RRR, distant HS, no M, SR on monitor Lungs: no wheezes anteriorly, diminished in bases, on 70% fio2 Abdomen: very obese, NT, BS decreased Ext: symmetric brawny edema of Bilat feet  LABS:  CBC  Recent Labs Lab 06/16/14 0345 06/17/14 0413 06/18/14 0400  WBC 14.5* 15.0* 14.4*  HGB 12.1* 11.6* 11.8*  HCT 37.1* 37.0* 37.0*  PLT 247 226 249   Coag's  Recent Labs Lab 06/15/14 1840 06/18/14 0400  APTT 35 57*   BMET  Recent Labs Lab 06/17/14 0413 06/17/14 1925 06/18/14 0400  NA 142 138 139  K 5.0 4.9 4.6  CL 103 99 99  CO2 30 27 32  BUN CREATININE 1.05 1.13 1.12  GLUCOSE 143* 173* 158*   Electrolytes  Recent Labs Lab 06/16/14 0018  06/17/14 0413 06/17/14 1925 06/18/14 0400  CALCIUM  --   < > 8.5 9.0 8.6  MG 2.1  --  2.2  --  2.1  PHOS  --   --  3.6  --  3.4  < > = values in this interval not displayed. Sepsis Markers  Recent Labs Lab 06/15/14 1856  06/17/14 0413 06/17/14 2149 06/18/14 0400  LATICACIDVEN 2.29*  --   --  1.1  --   PROCALCITON  --   < > <0.10 <0.10 <0.10  < > = values in this interval not displayed. ABG  Recent Labs Lab 06/17/14 2057 06/17/14 2309 06/18/14 0433  PHART 7.328* 7.315* 7.326*  PCO2ART 62.4* 66.0* 66.4*  PO2ART 99.9 80.6 75.0*   Liver Enzymes  Recent Labs Lab 06/15/14 1840  AST 15  ALT 9  ALKPHOS 70  BILITOT 0.9  ALBUMIN 3.3*  Dg Chest Port 1 View  06/18/2014   CLINICAL DATA:  Pneumonia with history of atrial fibrillation obesity and asthma  EXAM: PORTABLE CHEST - 1 VIEW  COMPARISON:  Chest x-rays of June 17, 2014  FINDINGS: There remain confluent areas of airspace consolidation throughout the right lung and in much of the left lung. When compared to yesterday's study there has been slight interval improvement. The cardiopericardial silhouette remains enlarged. The pulmonary vascularity is indistinct. There is no definite pleural effusion. The observed bony thorax is unremarkable.  IMPRESSION: Slight interval improvement in bilateral airspace opacities compatible with pneumonia. Pulmonary edema is felt less likely.   Electronically Signed   By: David  [place]   On: 06/18/2014 07:40   Dg Chest Port 1 View  06/17/2014   CLINICAL DATA:  Pneumonia, followup  EXAM: PORTABLE CHEST - 1 VIEW  COMPARISON:  Portable chest x-ray of 06/16/2014  FINDINGS: There has been worsening of airspace disease right greater than left. Considerations are that of diffuse pneumonia versus asymmetrical edema, with pneumonia favored. There is somewhat nodular contour to several of the opacities, but this is a new finding, and no definite lung nodules are evident. Cardiomegaly is again noted.  IMPRESSION: Worsening of airspace disease right greater than left. Favor pneumonia over asymmetrical edema   Electronically Signed   By: Dwyane Dee M.D.   On: 06/17/2014 08:10    CXR:  9/17 decreased edema, consolidation unchanged.   ASSESSMENT / PLAN:  PULMONARY A: Acute hypoxic resp failure Likely CAP H/O asthma OSA P:   Change CPAP (on 16cmH2O at home) to BiPAP 20/15 qhs and prn Mobilize as able Will assess for diuresis daily BD's prn  CARDIOVASCULAR  A:  PAF/flutter NSR presently Chronic beta blocker use P:  Cont home metoprolol Cont rivaroxaban >  change to  daily.  Monitor  RENAL A:   No issues P:   Monitor BMET intermittently Monitor I/Os Correct electrolytes as indicated  GASTROINTESTINAL A:   Morbid obesity P:   Started diet on 9/15 SUP not indicated unless intubated  HEMATOLOGIC A:   NO  issues P:  DVT px: full dose rivaroxaban (For PAF) >  daily Monitor CBC intermittently Transfuse per usual ICU guidelines  INFECTIOUS A:   CAP, NOS P:   Blood 9/14 >>  Strep Ag 9/14 >> neg Legionella Ag 9/14 >> neg  Vanc 9/14 >>  Levofloxacin 9/14 >>   ENDOCRINE CBG (last 3)   Recent Labs  06/17/14 1619 06/17/14 2116 06/18/14 0756  GLUCAP 124* 206* 153*   A:  DM 2 P:   Hold long acting insulin until taking POs Resistant scale SSI q 4 hrs Change SSI to ACHS and begin long acting after diet started. 9/16 on advanced diet but  SSI holding him in control. Hold off on novolog 70/30 till cbg rise. Currently NPO due to increased sob. Therfore hold on  NEUROLOGIC A:  ICU associated discomfort but A and O today 9/15 P:   RASS goal: 1 Cont home dose of PRN tramadol  TODAY'S SUMMARY:  OSA/OHS with acute on chronic resp failure, probable CAP. Consider also component volume overload. Using BiPAP prn. Attempting to mobilize, minimize sedating meds. Empiric diuresis on 9/16 and continue diuresis 9/17. He may yet need intubation for pna superimposed on severe OSA/OHS  Brett Canales Minor ACNP [facility] Pager [phone] till 3 pm If no answer page [phone]  35 minutes CC time  Levy Pupa, MD, PhD 06/18/2014, 11:39 AM [facility] [phone] or if no answer [phone]

## 2014-06-18 NOTE — Progress Notes (Signed)
RN took patient off BIPAP right before RT obtained ABG. Patient is now on NRB

## 2014-06-19 ENCOUNTER — Inpatient Hospital Stay (HOSPITAL_COMMUNITY): Payer: Medicare Other

## 2014-06-19 DIAGNOSIS — R6510 Systemic inflammatory response syndrome (SIRS) of non-infectious origin without acute organ dysfunction: Secondary | ICD-10-CM

## 2014-06-19 LAB — BASIC METABOLIC PANEL
Anion gap: 9 (ref 5–15)
BUN: 14 mg/dL (ref 6–23)
CO2: 34 mEq/L — ABNORMAL HIGH (ref 19–32)
Calcium: 8.7 mg/dL (ref 8.4–10.5)
Chloride: 98 mEq/L (ref 96–112)
Creatinine, Ser: 1.1 mg/dL (ref 0.50–1.35)
GFR calc Af Amer: >90 mL/min (ref >90)
GFR, EST NON AFRICAN AMERICAN: 81 mL/min — AB (ref >90)
Glucose, Bld: 166 mg/dL — ABNORMAL HIGH (ref 70–99)
POTASSIUM: 4.3 meq/L (ref 3.7–5.3)
SODIUM: 141 meq/L (ref 137–147)

## 2014-06-19 LAB — PROCALCITONIN

## 2014-06-19 LAB — CBC
HCT: 34.8 % — ABNORMAL LOW (ref 39.0–52.0)
Hemoglobin: 11.2 g/dL — ABNORMAL LOW (ref 13.0–17.0)
MCH: 27.6 pg (ref 26.0–34.0)
MCHC: 32.2 g/dL (ref 30.0–36.0)
MCV: 85.7 fL (ref 78.0–100.0)
Platelets: 223 10*3/uL (ref 150–400)
RBC: 4.06 MIL/uL — ABNORMAL LOW (ref 4.22–5.81)
RDW: 14.3 % (ref 11.5–15.5)
WBC: 11.6 10*3/uL — ABNORMAL HIGH (ref 4.0–10.5)

## 2014-06-19 LAB — MRSA CULTURE

## 2014-06-19 LAB — GLUCOSE, CAPILLARY
GLUCOSE-CAPILLARY: 211 mg/dL — AB (ref 70–99)
Glucose-Capillary: 170 mg/dL — ABNORMAL HIGH (ref 70–99)
Glucose-Capillary: 155 mg/dL — ABNORMAL HIGH (ref 70–99)
Glucose-Capillary: 191 mg/dL — ABNORMAL HIGH (ref 70–99)

## 2014-06-19 LAB — SEDIMENTATION RATE: Sed Rate: 84 mm/hr — ABNORMAL HIGH (ref 0–16)

## 2014-06-19 MED ORDER — SODIUM CHLORIDE 0.9 % IV SOLN
INTRAVENOUS | Status: DC
  Administered 2014-06-23: 12:00:00 via INTRAVENOUS

## 2014-06-19 MED ORDER — FUROSEMIDE 10 MG/ML IJ SOLN
40.0000 mg | Freq: Two times a day (BID) | INTRAMUSCULAR | Status: AC
  Administered 2014-06-19 (×2): 40 mg via INTRAVENOUS
  Filled 2014-06-19 (×2): qty 4

## 2014-06-19 MED ORDER — VANCOMYCIN HCL 10 G IV SOLR
1500.0000 mg | Freq: Three times a day (TID) | INTRAVENOUS | Status: DC
  Administered 2014-06-19 – 2014-06-22 (×8): 1500 mg via INTRAVENOUS
  Filled 2014-06-19 (×11): qty 1500

## 2014-06-19 NOTE — Progress Notes (Signed)
Pt on Non rebreather. Pt declined Bipap at this time.

## 2014-06-19 NOTE — Progress Notes (Signed)
PULMONARY / CRITICAL CARE MEDICINE   Name: JUDY GOODENOW MRN: [id number] DOB: 08-29-1971    ADMISSION DATE:  06/15/2014  INITIAL PRESENTATION: Morbidly obese 29 M  with presumed CAP and acute resp failure. High risk of intubation. Adm by PCCM from ED to ICU for PRN NPPV, abx  STUDIES:    SIGNIFICANT EVENTS: 9/14 admission: Morbidly obese 71 M  with presumed CAP and acute resp failure. High risk of intubation. Adm by PCCM from ED to ICU for PRN NPPV, abx 9/16 worsening aeration on c x r and high fio2 needs refractory to current tx. Try diuresis.   9/16 nocturnal fever 102 with increased resp acidosis. 9/18 remains on 70% FIO2 despite being 9 litre negative over 48 hours  LINES/TUBES:  MICRO: Blood 9/14 >>  Blood 9/16>>  PCT 9/14:  ,  9/15: 0.19   9/16:   <0.10 ABX: 9/14 leva>>9/16 Cefepime 9/16>> 9/14 vanc>>  HISTORY OF PRESENT ILLNESS:   Pleasant 69 M with extensive PMH mostly stemming from morbid obesity. Admitted via [facility] ED with acute hypoxic resp failure and extensive R>L AS dz on CXR. On the evening prior to admission, he noted fever for which he took ibuprofen, He awoke on the morning of admission with severe dyspnea and retrosternal CP both of which were unabated with use of his BD MDI. Therefore he came to [facility] ED where his exam revealed fever to 103, severe dyspnea. CXR revealed extensive consolidation throughout R lung with patchy infiltrate on L. He was started on BiPAP and PCCM called to admit   SUBJECTIVE:  Awake and alert. Remains on NRB at 70%. Using bipap nocturnally. NSC   VITAL SIGNS: Temp:  [98.4 F (36.9 C)-101.1 F (38.4 C)] 98.8 F (37.1 C) (09/18 1000) Pulse Rate:  [77-94] 90 (09/18 1000) Resp:  [15-33] 27 (09/18 1000) BP: (90-171)/(48-97) 171/97 mmHg (09/18 0905) SpO2:  [90 %-100 %] 90 % (09/18 1000) FiO2 (%):  [50 %-100 %] 100 % (09/18 0800) Weight:  [568 lb 12.6 oz (258 kg)] 568 lb 12.6 oz (258 kg) (09/18 0600) HEMODYNAMICS:   VENTILATOR  SETTINGS: Vent Mode:  [-] BIPAP FiO2 (%):  [50 %-100 %] 100 % Set Rate:  [18 bmp] 18 bmp PEEP:  [15 cmH20] 15 cmH20 INTAKE / OUTPUT:  Intake/Output Summary (Last 24 hours) at 06/19/14 1115 Last data filed at 06/19/14 1000  Gross per 24 hour  Intake   2170 ml  Output   6750 ml  Net  -4580 ml    PHYSICAL EXAMINATION: General: Very obese, speaks in full sentences off BiPAP, looks better than his numbers. Neuro: No focal deficits noted. HEENT: NCAT, EOMI, PERRL Cardiovascular: RRR, distant HS, no M, SR on monitor Lungs: no wheezes anteriorly, diminished in bases, on 70% fio2. Sats 96% when awake. Abdomen: very obese, NT, BS decreased Ext: symmetric brawny edema of Bilat feet  LABS:  CBC  Recent Labs Lab 06/17/14 0413 06/18/14 0400 06/19/14 0340  WBC 15.0* 14.4* 11.6*  HGB 11.6* 11.8* 11.2*  HCT 37.0* 37.0* 34.8*  PLT 226 249 223   Coag's  Recent Labs Lab 06/15/14 1840 06/18/14 0400  APTT 35 57*   BMET  Recent Labs Lab 06/17/14 1925 06/18/14 0400 06/19/14 0340  NA 138 139 141  K 4.9 4.6 4.3  CL 99 99 98  CO2 27 32 34*  BUN CREATININE 1.13 1.12 1.10  GLUCOSE 173* 158* 166*   Electrolytes  Recent Labs Lab 06/16/14 0018  06/17/14 0413 06/17/14 1925 06/18/14 0400 06/19/14 0340  CALCIUM  --   < > 8.5 9.0 8.6 8.7  MG 2.1  --  2.2  --  2.1  --   PHOS  --   --  3.6  --  3.4  --   < > = values in this interval not displayed. Sepsis Markers  Recent Labs Lab 06/15/14 1856  06/17/14 2149 06/18/14 0400 06/19/14 0340  LATICACIDVEN 2.29*  --  1.1  --   --   PROCALCITON  --   < > <0.10 <0.10 <0.10  < > = values in this interval not displayed. ABG  Recent Labs Lab 06/17/14 2057 06/17/14 2309 06/18/14 0433  PHART 7.328* 7.315* 7.326*  PCO2ART 62.4* 66.0* 66.4*  PO2ART 99.9 80.6 75.0*   Liver Enzymes  Recent Labs Lab 06/15/14 1840  AST 15  ALT 9  ALKPHOS 70  BILITOT 0.9  ALBUMIN 3.3*  Dg Chest Port 1 View  06/19/2014    CLINICAL DATA:  Pneumonia, followup  EXAM: PORTABLE CHEST - 1 VIEW  COMPARISON:  Portable chest x-ray of 06/18/2014  FINDINGS: There is little change in diffuse airspace disease with the right lung more involved than the left. Considerations are that of pneumonia versus asymmetrical edema. Cardiomegaly is stable. No definite effusion is seen.  IMPRESSION: Little change in diffuse airspace disease right worse than left. Consider pneumonia versus asymmetrical edema.   Electronically Signed   By: Dwyane Dee M.D.   On: 06/19/2014 07:56   Dg Chest Port 1 View  06/18/2014   CLINICAL DATA:  Pneumonia with history of atrial fibrillation obesity and asthma  EXAM: PORTABLE CHEST - 1 VIEW  COMPARISON:  Chest x-rays of June 17, 2014  FINDINGS: There remain confluent areas of airspace consolidation throughout the right lung and in much of the left lung. When compared to yesterday's study there has been slight interval improvement. The cardiopericardial silhouette remains enlarged. The pulmonary vascularity is indistinct. There is no definite pleural effusion. The observed bony thorax is unremarkable.  IMPRESSION: Slight interval improvement in bilateral airspace opacities compatible with pneumonia. Pulmonary edema is felt less likely.   Electronically Signed   By: David  [place]   On: 06/18/2014 07:40    Intake/Output Summary (Last 24 hours) at 06/19/14 1120 Last data filed at 06/19/14 1000  Gross per 24 hour  Intake   2170 ml  Output   6750 ml  Net  -4580 ml     CXR:  9/18 nsc   ASSESSMENT / PLAN:  PULMONARY A: Acute hypoxic resp failure Likely CAP H/O asthma OSA P:   Change CPAP (on 16cmH2O at home) to BiPAP 20/15 qhs and prn Mobilize as able Will assess for diuresis daily, continue diuresis 9/18 BD's prn  CARDIOVASCULAR  A:  PAF/flutter NSR presently Chronic beta blocker use P:  Cont home metoprolol Cont rivaroxaban >  change to  daily.  Monitor  RENAL Lab Results   Component Value Date   CREATININE 1.10 06/19/2014   CREATININE 1.12 06/18/2014   CREATININE 1.13 06/17/2014    A:   No issues P:   Monitor BMET intermittently Monitor I/Os Correct electrolytes as indicated  GASTROINTESTINAL A:   Morbid obesity P:   Started diet on 9/15 SUP not indicated unless intubated  HEMATOLOGIC A:   NO issues P:  DVT px: full dose rivaroxaban (For PAF) >  daily Monitor CBC intermittently Transfuse per usual ICU guidelines  INFECTIOUS A:   CAP, NOS  P:   Blood 9/14 >>  Strep Ag 9/14 >> neg Legionella Ag 9/14 >> neg  Vanc 9/14 >>  Levofloxacin 9/14 >>   ENDOCRINE CBG (last 3)   Recent Labs  06/18/14 1622 06/18/14 2116 06/19/14 0820  GLUCAP 178* 170* 155*   A:  DM 2 P:   Hold long acting insulin until taking POs Resistant scale SSI q 4 hrs Change SSI to ACHS and begin long acting after diet started. 9/16 on advanced diet but SSI holding him in control. Hold off on novolog 70/30 till cbg rise.   NEUROLOGIC A:  ICU associated discomfort but A and O today 9/18 P:   RASS goal: 1   TODAY'S SUMMARY:  Remains on high flow O2 despite negative 10 litre's. CxR looks horrible and he  remains onhigh flow O2 . Continue diuresis for today but concerned he will get intubated before PNA fully treated.  Brett Canales Minor ACNP [facility] P[facility] Pager [phone] till 3 pm If no answer page [phone]  Levy Pupa, MD, PhD 06/19/2014, 11:59 AM [facility] [phone] or if no answer [phone]

## 2014-06-20 ENCOUNTER — Inpatient Hospital Stay (HOSPITAL_COMMUNITY): Payer: Medicare Other

## 2014-06-20 LAB — CBC
HCT: 37.2 % — ABNORMAL LOW (ref 39.0–52.0)
Hemoglobin: 11.9 g/dL — ABNORMAL LOW (ref 13.0–17.0)
MCH: 27.9 pg (ref 26.0–34.0)
MCHC: 32 g/dL (ref 30.0–36.0)
MCV: 87.1 fL (ref 78.0–100.0)
PLATELETS: 276 10*3/uL (ref 150–400)
RBC: 4.27 MIL/uL (ref 4.22–5.81)
RDW: 14.2 % (ref 11.5–15.5)
WBC: 11.3 10*3/uL — AB (ref 4.0–10.5)

## 2014-06-20 LAB — GLUCOSE, CAPILLARY
GLUCOSE-CAPILLARY: 215 mg/dL — AB (ref 70–99)
GLUCOSE-CAPILLARY: 223 mg/dL — AB (ref 70–99)
Glucose-Capillary: 148 mg/dL — ABNORMAL HIGH (ref 70–99)
Glucose-Capillary: 257 mg/dL — ABNORMAL HIGH (ref 70–99)
Glucose-Capillary: 229 mg/dL — ABNORMAL HIGH (ref 70–99)

## 2014-06-20 LAB — BASIC METABOLIC PANEL
ANION GAP: 9 (ref 5–15)
BUN: 16 mg/dL (ref 6–23)
CHLORIDE: 94 meq/L — AB (ref 96–112)
CO2: 38 mEq/L — ABNORMAL HIGH (ref 19–32)
Calcium: 8.9 mg/dL (ref 8.4–10.5)
Creatinine, Ser: 1.09 mg/dL (ref 0.50–1.35)
GFR calc Af Amer: >90 mL/min (ref >90)
GFR calc non Af Amer: 82 mL/min — ABNORMAL LOW (ref >90)
GLUCOSE: 160 mg/dL — AB (ref 70–99)
Potassium: 4.1 mEq/L (ref 3.7–5.3)
SODIUM: 141 meq/L (ref 137–147)

## 2014-06-20 LAB — MAGNESIUM: MAGNESIUM: 2.1 mg/dL (ref 1.5–2.5)

## 2014-06-20 LAB — PHOSPHORUS: Phosphorus: 3.3 mg/dL (ref 2.3–4.6)

## 2014-06-20 MED ORDER — FUROSEMIDE 10 MG/ML IJ SOLN
40.0000 mg | Freq: Once | INTRAMUSCULAR | Status: AC
Start: 2014-06-20 — End: 2014-06-20
  Administered 2014-06-20: 40 mg via INTRAVENOUS
  Filled 2014-06-20: qty 4

## 2014-06-20 MED ORDER — CETYLPYRIDINIUM CHLORIDE 0.05 % MT LIQD
7.0000 mL | Freq: Two times a day (BID) | OROMUCOSAL | Status: DC
  Administered 2014-06-20 – 2014-06-25 (×10): 7 mL via OROMUCOSAL

## 2014-06-20 MED ORDER — CHLORHEXIDINE GLUCONATE 0.12 % MT SOLN
15.0000 mL | Freq: Two times a day (BID) | OROMUCOSAL | Status: DC
  Administered 2014-06-20 – 2014-06-25 (×10): 15 mL via OROMUCOSAL
  Filled 2014-06-20 (×10): qty 15

## 2014-06-20 NOTE — Progress Notes (Signed)
PULMONARY / CRITICAL CARE MEDICINE   Name: [name] MRN: [id number] DOB: [birth year]    ADMISSION DATE:  06/15/2014  INITIAL PRESENTATION: Morbidly obese 29 M  with presumed CAP and acute resp failure. High risk of intubation. Adm by PCCM from ED to ICU for PRN NPPV, abx  STUDIES:    SIGNIFICANT EVENTS: 9/14 admission: Morbidly obese 15 M  with presumed CAP and acute resp failure. High risk of intubation. Adm by PCCM from ED to ICU for PRN NPPV, abx 9/16 worsening aeration on c x r and high fio2 needs refractory to current tx. Try diuresis.   9/16 nocturnal fever 102 with increased resp acidosis. 9/18 remains on 70% FIO2 despite being 9 litre negative over 48 hours  LINES/TUBES:  MICRO: Blood 9/14 >>  Blood 9/16>>  PCT 9/14:  ,  9/15: 0.19   9/16:   <0.10 ABX: 9/14 leva>>9/16 Cefepime 9/16>> 9/14 vanc>>   SUBJECTIVE:  Awake and alert. Remains on NRB at 70%. Using bipap nocturnally. NSC Pt states he is better.   VITAL SIGNS: Temp:  [98.4 F (36.9 C)-100 F (37.8 C)] 98.4 F (36.9 C) (09/19 0700) Pulse Rate:  [76-90] 82 (09/19 0700) Resp:  [9-37] 24 (09/19 0700) BP: (100-171)/(42-97) 140/78 mmHg (09/19 0700) SpO2:  [79 %-100 %] 96 % (09/19 0700) FiO2 (%):  [60 %-70 %] 70 % (09/19 0614) Weight:  [259 kg (570 lb 15.9 oz)] 259 kg (570 lb 15.9 oz) (09/19 0300)    VENTILATOR SETTINGS: Vent Mode:  [-] Other (Comment) FiO2 (%):  [60 %-70 %] 70 % Set Rate:  [18 bmp] 18 bmp PEEP:  [15 cmH20] 15 cmH20 INTAKE / OUTPUT:  Intake/Output Summary (Last 24 hours) at 06/20/14 0838 Last data filed at 06/20/14 0600  Gross per 24 hour  Intake   2500 ml  Output   5175 ml  Net  -2675 ml    PHYSICAL EXAMINATION: General: Very obese, speaks in full sentences off BiPAP, looks better than his numbers. Neuro: No focal deficits noted. HEENT: NCAT, EOMI, PERRL Cardiovascular: RRR, distant HS, no M, SR on monitor Lungs: no wheezes anteriorly, diminished in bases, on 70% fio2.  Sats 96% when awake. Abdomen: very obese, NT, BS decreased Ext: symmetric brawny edema of Bilat feet  LABS:  CBC  Recent Labs Lab 06/18/14 0400 06/19/14 0340 06/20/14 0348  WBC 14.4* 11.6* 11.3*  HGB 11.8* 11.2* 11.9*  HCT 37.0* 34.8* 37.2*  PLT 249 223 276   Coag's  Recent Labs Lab 06/15/14 1840 06/18/14 0400  APTT 35 57*   BMET  Recent Labs Lab 06/18/14 0400 06/19/14 0340 06/20/14 0348  NA 139 141 141  K 4.6 4.3 4.1  CL 99 98 94*  CO2 32 34* 38*  BUN CREATININE 1.12 1.10 1.09  GLUCOSE 158* 166* 160*   Electrolytes  Recent Labs Lab 06/17/14 0413  06/18/14 0400 06/19/14 0340 06/20/14 0348  CALCIUM 8.5  < > 8.6 8.7 8.9  MG 2.2  --  2.1  --  2.1  PHOS 3.6  --  3.4  --  3.3  < > = values in this interval not displayed. Sepsis Markers  Recent Labs Lab 06/15/14 1856  06/17/14 2149 06/18/14 0400 06/19/14 0340  LATICACIDVEN 2.29*  --  1.1  --   --   PROCALCITON  --   < > <0.10 <0.10 <0.10  < > = values in this interval not displayed. ABG  Recent Labs Lab 06/17/14 2057  06/17/14 2309 06/18/14 0433  PHART 7.328* 7.315* 7.326*  PCO2ART 62.4* 66.0* 66.4*  PO2ART 99.9 80.6 75.0*   Liver Enzymes  Recent Labs Lab 06/15/14 1840  AST 15  ALT 9  ALKPHOS 70  BILITOT 0.9  ALBUMIN 3.3*  Dg Chest Port 1 View  06/20/2014   CLINICAL DATA:  Pneumonia.  EXAM: PORTABLE CHEST - 1 VIEW  COMPARISON:  06/19/2014  FINDINGS: Cardiac silhouette remains enlarged, unchanged. Diffuse bilateral airspace opacities involving the right greater than left lungs do not appear significantly changed. No definite pleural effusion or pneumothorax is identified.  IMPRESSION: Unchanged, diffuse right greater than left lung airspace disease, which may reflect multifocal pneumonia or asymmetric edema.   Electronically Signed   By: Sebastian Ache   On: 06/20/2014 07:43   Dg Chest Port 1 View  06/19/2014   CLINICAL DATA:  Pneumonia, followup  EXAM: PORTABLE CHEST - 1 VIEW   COMPARISON:  Portable chest x-ray of 06/18/2014  FINDINGS: There is little change in diffuse airspace disease with the right lung more involved than the left. Considerations are that of pneumonia versus asymmetrical edema. Cardiomegaly is stable. No definite effusion is seen.  IMPRESSION: Little change in diffuse airspace disease right worse than left. Consider pneumonia versus asymmetrical edema.   Electronically Signed   By: Dwyane Dee M.D.   On: 06/19/2014 07:56    Intake/Output Summary (Last 24 hours) at 06/20/14 0838 Last data filed at 06/20/14 0600  Gross per 24 hour  Intake   2500 ml  Output   5175 ml  Net  -2675 ml     CXR:  9/19 nsc   ASSESSMENT / PLAN:  PULMONARY A: Acute hypoxic resp failure Likely CAP H/O asthma OSA P:   Cont  BiPAP 20/15 qhs and prn Mobilize as able Will assess for diuresis daily, continue diuresis 9/19 BD's On schedule  CARDIOVASCULAR  A:  PAF/flutter NSR presently Chronic beta blocker use P:  Cont home metoprolol Cont rivaroxaban >  change to  daily.  Monitor  RENAL Lab Results  Component Value Date   CREATININE 1.09 06/20/2014   CREATININE 1.10 06/19/2014   CREATININE 1.12 06/18/2014    A:   No issues P:   Monitor BMET intermittently Monitor I/Os Correct electrolytes as indicated  GASTROINTESTINAL A:   Morbid obesity P:   Started diet on 9/15 SUP not indicated unless intubated  HEMATOLOGIC A:   NO issues P:  DVT px: on rivaroxiban Monitor CBC intermittently Transfuse per usual ICU guidelines  INFECTIOUS A:   CAP, NOS P:   Blood 9/14 >> neg Strep Ag 9/14 >> neg Legionella Ag 9/14 >> neg  Vanc 9/14 >>  Levofloxacin 9/14 >> 9/15 Cefepime 9/14>>  ENDOCRINE CBG (last 3)   Recent Labs  06/19/14 1209 06/19/14 1629 06/19/14 2142  GLUCAP 191* 211* 215*   A:  DM 2 P:   Hold long acting insulin until taking POs Cont SSI @ ACHS and begin long acting  on advanced diet but SSI holding him in  control.  NEUROLOGIC A:  ICU associated discomfort but A and O today 9/19 P:   RASS goal: 0   TODAY'S SUMMARY:  Remains on high flow O2 despite negative 10 litre's. Cont high risk intubation but holding his own. Cont diuresis.  BDs on schedule   Caryl Bis  [phone]  Cell  [phone]  If no response or cell goes to voicemail, call beeper [phone]   06/20/2014, 8:38 AM

## 2014-06-21 ENCOUNTER — Encounter (HOSPITAL_COMMUNITY): Payer: Self-pay | Admitting: Pulmonary Disease

## 2014-06-21 ENCOUNTER — Inpatient Hospital Stay (HOSPITAL_COMMUNITY): Payer: Medicare Other

## 2014-06-21 LAB — BASIC METABOLIC PANEL
Anion gap: 12 (ref 5–15)
BUN: 17 mg/dL (ref 6–23)
CALCIUM: 8.7 mg/dL (ref 8.4–10.5)
CO2: 35 mEq/L — ABNORMAL HIGH (ref 19–32)
CREATININE: 1.02 mg/dL (ref 0.50–1.35)
Chloride: 93 mEq/L — ABNORMAL LOW (ref 96–112)
GFR calc Af Amer: >90 mL/min (ref >90)
GFR calc non Af Amer: 88 mL/min — ABNORMAL LOW (ref >90)
Glucose, Bld: 185 mg/dL — ABNORMAL HIGH (ref 70–99)
Potassium: 4.6 mEq/L (ref 3.7–5.3)
Sodium: 140 mEq/L (ref 137–147)

## 2014-06-21 LAB — CBC
HCT: 36.3 % — ABNORMAL LOW (ref 39.0–52.0)
HEMOGLOBIN: 12 g/dL — AB (ref 13.0–17.0)
MCH: 27.8 pg (ref 26.0–34.0)
MCHC: 33.1 g/dL (ref 30.0–36.0)
MCV: 84 fL (ref 78.0–100.0)
Platelets: 251 10*3/uL (ref 150–400)
RBC: 4.32 MIL/uL (ref 4.22–5.81)
RDW: 14.1 % (ref 11.5–15.5)
WBC: 10.5 10*3/uL (ref 4.0–10.5)

## 2014-06-21 LAB — GLUCOSE, CAPILLARY
GLUCOSE-CAPILLARY: 156 mg/dL — AB (ref 70–99)
Glucose-Capillary: 250 mg/dL — ABNORMAL HIGH (ref 70–99)
Glucose-Capillary: 202 mg/dL — ABNORMAL HIGH (ref 70–99)
Glucose-Capillary: 276 mg/dL — ABNORMAL HIGH (ref 70–99)

## 2014-06-21 MED ORDER — IPRATROPIUM-ALBUTEROL 0.5-2.5 (3) MG/3ML IN SOLN
3.0000 mL | Freq: Four times a day (QID) | RESPIRATORY_TRACT | Status: DC
  Administered 2014-06-21 – 2014-06-26 (×19): 3 mL via RESPIRATORY_TRACT
  Filled 2014-06-21 (×19): qty 3

## 2014-06-21 NOTE — Progress Notes (Signed)
This note also relates to the following rows which could not be included: Temp - Cannot attach notes to unvalidated device data Pulse Rate - Cannot attach notes to unvalidated device data Resp - Cannot attach notes to unvalidated device data SpO2 - Cannot attach notes to unvalidated device data    06/21/14 0011  BiPAP/CPAP/SIPAP  BiPAP/CPAP/SIPAP Pt Type Adult  Mask Type Full face mask  Mask Size Extra large  Set Rate 12 breaths/min  Respiratory Rate 29 breaths/min  IPAP 20 cmH20  EPAP 15 cmH2O  Oxygen Percent 60 %  Minute Ventilation 15  Leak 75  Peak Inspiratory Pressure (PIP) 29  Tidal Volume (Vt) 377  BiPAP/CPAP/SIPAP BiPAP  Patient Home Equipment No  Placed on these specific settings per MD order.

## 2014-06-21 NOTE — Progress Notes (Addendum)
PULMONARY / CRITICAL CARE MEDICINE   Name: [name] MRN: [id number] DOB: [birth year]    ADMISSION DATE:  06/15/2014  INITIAL PRESENTATION: 43 yo male presented with fever, dyspnea, chest pain from PNA.  PCCM asked to admit to ICU.  STUDIES:   SIGNIFICANT EVENTS: 9/14 admission: Morbidly obese 30 M  with presumed CAP and acute resp failure. High risk of intubation. Adm by PCCM from ED to ICU for PRN NPPV, abx 9/16 worsening aeration on c x r and high fio2 needs refractory to current tx. Try diuresis.   9/16 nocturnal fever 102 with increased resp acidosis. 9/18 remains on 70% FIO2 despite being 9 liter negative over 48 hours  SUBJECTIVE:  Sitting in chair.  Remains on increased oxygen.  VITAL SIGNS: Temp:  [97.5 F (36.4 C)-99.7 F (37.6 C)] 97.5 F (36.4 C) (09/20 0800) Pulse Rate:  [69-97] 82 (09/20 1000) Resp:  [16-39] 30 (09/20 1000) BP: (98-164)/(46-97) 142/93 mmHg (09/20 1000) SpO2:  [89 %-100 %] 96 % (09/20 1000) FiO2 (%):  [8 %-60 %] 8 % (09/20 0826) Weight:  [566 lb 9.3 oz (257 kg)] 566 lb 9.3 oz (257 kg) (09/20 0400) INTAKE / OUTPUT:  Intake/Output Summary (Last 24 hours) at 06/21/14 1017 Last data filed at 06/21/14 0600  Gross per 24 hour  Intake   1942 ml  Output   3465 ml  Net  -1523 ml    PHYSICAL EXAMINATION: General: sitting in chair, speaks in full sentences Neuro: alert, follows commands HEENT: Rt eye ptosis, visually impared Cardiovascular: regular Lungs: decreased breath sounds, no wheeze Abdomen: soft, non tender Ext: symmetric brawny edema of Bilat feet  LABS:  CBC  Recent Labs Lab 06/19/14 0340 06/20/14 0348 06/21/14 0405  WBC 11.6* 11.3* 10.5  HGB 11.2* 11.9* 12.0*  HCT 34.8* 37.2* 36.3*  PLT 223 276 251   Coag's  Recent Labs Lab 06/15/14 1840 06/18/14 0400  APTT 35 57*   BMET  Recent Labs Lab 06/19/14 0340 06/20/14 0348 06/21/14 0405  NA 141 141 140  K 4.3 4.1 4.6  CL 98 94* 93*  CO2 34* 38* 35*  BUN CREATININE 1.10 1.09 1.02  GLUCOSE 166* 160* 185*   Electrolytes  Recent Labs Lab 06/17/14 0413  06/18/14 0400 06/19/14 0340 06/20/14 0348 06/21/14 0405  CALCIUM 8.5  < > 8.6 8.7 8.9 8.7  MG 2.2  --  2.1  --  2.1  --   PHOS 3.6  --  3.4  --  3.3  --   < > = values in this interval not displayed.  Sepsis Markers  Recent Labs Lab 06/15/14 1856  06/17/14 2149 06/18/14 0400 06/19/14 0340  LATICACIDVEN 2.29*  --  1.1  --   --   PROCALCITON  --   < > <0.10 <0.10 <0.10  < > = values in this interval not displayed.  ABG  Recent Labs Lab 06/17/14 2057 06/17/14 2309 06/18/14 0433  PHART 7.328* 7.315* 7.326*  PCO2ART 62.4* 66.0* 66.4*  PO2ART 99.9 80.6 75.0*   Liver Enzymes  Recent Labs Lab 06/15/14 1840  AST 15  ALT 9  ALKPHOS 70  BILITOT 0.9  ALBUMIN 3.3*   CXR:  Dg Chest Port 1 View  06/21/2014   CLINICAL DATA:  Followup pneumonia.  EXAM: PORTABLE CHEST - 1 VIEW  COMPARISON:  Portable chest x-rays yesterday dating back to 06/15/2014.  FINDINGS: No significant change over the past 3 days in the airspace opacities throughout  both lungs, right greater than left. There has been slight overall improvement since the initial examination on 06/15/2014. No new pulmonary parenchymal abnormalities. Cardiac silhouette markedly enlarged but stable.  IMPRESSION: Stable bilateral pneumonia, right greater than left, over the past 3 days. There has been slight overall improvement since the initial examination on 06/15/2014. No new abnormalities.   Electronically Signed   By: Hulan Saas M.D.   On: 06/21/2014 07:12   Dg Chest Port 1 View  06/20/2014   CLINICAL DATA:  Pneumonia.  EXAM: PORTABLE CHEST - 1 VIEW  COMPARISON:  06/19/2014  FINDINGS: Cardiac silhouette remains enlarged, unchanged. Diffuse bilateral airspace opacities involving the right greater than left lungs do not appear significantly changed. No definite pleural effusion or pneumothorax is identified.   IMPRESSION: Unchanged, diffuse right greater than left lung airspace disease, which may reflect multifocal pneumonia or asymmetric edema.   Electronically Signed   By: Sebastian Ache   On: 06/20/2014 07:43    ASSESSMENT / PLAN:  PULMONARY A: Acute hypoxic resp failure 2nd to pneumonia. Hx of OSA, asthma. P:   Continue BiPAP 20/15 cm H2O qhs and prn during day Monitor need for intubation F/u CXR Scheduled BD's  CARDIOVASCULAR Rt PICC 9/17 >>  A:  Hx of A fib/flutter >> NSR presently. P:  Continue home metoprolol, amiodarone Continue rivaroxaban >  change to  daily Goal even to negative fluid balance >> hold lasix 9/20  RENAL A:   No issues. P:   Monitor renal fx, urine outpt, electrolytes  GASTROINTESTINAL A:   Morbid obesity. P:   F/u with nutrition  HEMATOLOGIC A:   Mild anemia. P:  F/u CBC  INFECTIOUS A:   Pneumonia. P:   Day 7 of Abx, currently on vancomycin, cefepime  ENDOCRINE A:  DM type II. P:   SSI  NEUROLOGIC A:  Acute encephalopathy 2nd to respiratory failure >> improved. P:   Monitor clinically Continue outpt wellbutrin, depakote  TODAY'S SUMMARY:  Slowly improving.  Keep in ICU while needing BiPAP and increased O2.  CC time 35 minutes.  Coralyn Helling, MD [facility] Pulmonary/Critical Care 06/21/2014, 10:24 AM Pager:  [phone] After 3pm call: [phone]

## 2014-06-21 NOTE — Progress Notes (Signed)
ANTIBIOTIC CONSULT NOTE - Follow Up  Pharmacy Consult for Vancomycin, Cefepime Indication: pneumonia  Allergies  Allergen Reactions  . Iodine Anaphylaxis and Other (See Comments)    Associated with the shellfish allergy  . Shellfish Allergy Anaphylaxis    Patient Measurements: Height:  (190.5 cm) Weight: 566 lb 9.3 oz (257 kg) IBW/kg (Calculated) : 84.5  Vital Signs: Temp: 97.5 F (36.4 C) (09/20 0800) Temp src: Oral (09/20 0800) BP: 150/103 mmHg (09/20 1100) Pulse Rate: 80 (09/20 1100) Intake/Output from previous day: 09/19 0701 - 09/20 0700 In: 2682 [P.O.:942; I.V.:90; IV Piggyback:1650] Out: 3815 [Urine:3815] Intake/Output from this shift: Total I/O In: 1405 [P.O.:905; IV Piggyback:500] Out: 350 [Urine:350]  Labs:  Recent Labs  06/19/14 0340 06/20/14 0348 06/21/14 0405  WBC 11.6* 11.3* 10.5  HGB 11.2* 11.9* 12.0*  PLT 223 276 251  CREATININE 1.10 1.09 1.02   Estimated Creatinine Clearance: 202.7 ml/min (by C-G formula based on Cr of 1.02).  Recent Labs  06/18/14 2050  VANCOTROUGH 17.6      Assessment: 43 yo obese male presents with chest pain and shortness of breath with cough and fever, CXR shows pneumonia, code sepsis called.  Pharmacy consulted to dose vancomycin and Levaquin for presumed CAP.  Antibiotics then switched to vancomycin and Cefepime to avoid drug interaction with Levaquin and amiodarone.  9/14 >> Cefepime >>x1 ED 9/14 >> Vanc >> 9/15 >>Levaquin >> 9/16 (switched to Cefepime d/t DDI with amio) 9/16 >> Cefepime >>  Tmax: afebrile WBC: improved to WNL Renal: SCr 1.20, CrCl ~94 ml/min (normalized) LA: 2.29 > 1.1 PCT trended down to <0.10 Vanc Trough 9/17 = 17.6 - therapeutic  9/14 blood x 2: ngtd 9/14 Ur Strep Ag: neg 9/14 Ur Legionella Ag: neg 9/15 MRSA PCR pending  Today is day #7 vancomycin 1500 mg IV q8h and day #5 Cefepime 2g IV q8h for sepsis, pneumonia source.   Goal of Therapy:  Vancomycin trough level 15-20  mcg/ml Doses adjusted per renal function Eradication of infection  Plan:  1.  Continue Vancomycin  IV q8 2.  Continue Cefepime 2g IV q8h. 3.  What is plan for length of therapy of antibiotics at this point? 8 days?  Berkley Harvey 06/21/2014,12:03 PM

## 2014-06-21 NOTE — Progress Notes (Signed)
Pt requested to go on qhs BiPAP at about 23:00.

## 2014-06-22 ENCOUNTER — Inpatient Hospital Stay (HOSPITAL_COMMUNITY): Payer: Medicare Other

## 2014-06-22 LAB — GLUCOSE, CAPILLARY
GLUCOSE-CAPILLARY: 204 mg/dL — AB (ref 70–99)
Glucose-Capillary: 189 mg/dL — ABNORMAL HIGH (ref 70–99)
Glucose-Capillary: 335 mg/dL — ABNORMAL HIGH (ref 70–99)
Glucose-Capillary: 251 mg/dL — ABNORMAL HIGH (ref 70–99)

## 2014-06-22 LAB — BASIC METABOLIC PANEL
Anion gap: 8 (ref 5–15)
BUN: 16 mg/dL (ref 6–23)
CHLORIDE: 96 meq/L (ref 96–112)
CO2: 37 meq/L — AB (ref 19–32)
CREATININE: 0.98 mg/dL (ref 0.50–1.35)
Calcium: 8.8 mg/dL (ref 8.4–10.5)
GFR calc Af Amer: >90 mL/min (ref >90)
GFR calc non Af Amer: >90 mL/min (ref >90)
Glucose, Bld: 191 mg/dL — ABNORMAL HIGH (ref 70–99)
POTASSIUM: 4.6 meq/L (ref 3.7–5.3)
Sodium: 141 mEq/L (ref 137–147)

## 2014-06-22 LAB — CULTURE, BLOOD (ROUTINE X 2)
Culture: NO GROWTH
Culture: NO GROWTH

## 2014-06-22 LAB — CBC
HEMATOCRIT: 35.1 % — AB (ref 39.0–52.0)
HEMOGLOBIN: 11.3 g/dL — AB (ref 13.0–17.0)
MCH: 27.6 pg (ref 26.0–34.0)
MCHC: 32.2 g/dL (ref 30.0–36.0)
MCV: 85.8 fL (ref 78.0–100.0)
Platelets: 267 10*3/uL (ref 150–400)
RBC: 4.09 MIL/uL — AB (ref 4.22–5.81)
RDW: 14 % (ref 11.5–15.5)
WBC: 10 10*3/uL (ref 4.0–10.5)

## 2014-06-22 MED ORDER — POTASSIUM CHLORIDE 20 MEQ/15ML (10%) PO LIQD
40.0000 meq | Freq: Once | ORAL | Status: AC
  Administered 2014-06-22: 40 meq via ORAL
  Filled 2014-06-22: qty 30

## 2014-06-22 MED ORDER — FUROSEMIDE 10 MG/ML IJ SOLN
40.0000 mg | Freq: Once | INTRAMUSCULAR | Status: AC
  Administered 2014-06-22: 40 mg via INTRAVENOUS
  Filled 2014-06-22: qty 4

## 2014-06-22 MED ORDER — LEVOFLOXACIN 750 MG PO TABS
750.0000 mg | ORAL_TABLET | Freq: Every day | ORAL | Status: AC
  Administered 2014-06-22 – 2014-06-23 (×2): 750 mg via ORAL
  Filled 2014-06-22 (×2): qty 1

## 2014-06-22 MED ORDER — NYSTATIN 100000 UNIT/GM EX POWD
Freq: Three times a day (TID) | CUTANEOUS | Status: DC
  Administered 2014-06-22 – 2014-06-25 (×9): via TOPICAL
  Filled 2014-06-22 (×2): qty 15

## 2014-06-22 NOTE — Progress Notes (Signed)
Nutrition Education Note  RD consulted for nutrition education regarding morbid obesity.   RD provided "Weight loss management cooking tips" handout from the Academy of [employer]. As well as a 5 day sample healthy eating menu plan. Handouts discussed ways to choose lower calorie and lower fat options and promoted a well balanced healthy diet.   - Met with pt who reports he's been trying to eat healthier  - Has been watching his portion sizes and does not eat junk food - Drinks only water and [employer] he eats a well balanced breakfast, lunch, and dinner  - Walks 1-3 miles/day  - Said he's been working with an outpatient RD at the [facility] [place] to, in the past 2 weeks, slipping up on his diet and eating a lot of [employer] pizza and cookies, but is going to do better   Expect good compliance.  Body mass index is 71.64 kg/(m^2). Pt meets criteria for morbid obesity based on current BMI.  Current diet order is heart healthy, patient is consuming approximately 100% of meals at this time. Labs and medications reviewed. No further nutrition interventions warranted at this time. RD contact information provided. If additional nutrition issues arise, please re-consult RD.  Carlis Stable MS, [place], LDN [phone] Pager [phone] Weekend/After Hours Pager

## 2014-06-22 NOTE — Progress Notes (Signed)
PULMONARY / CRITICAL CARE MEDICINE   Name: [name] MRN: [id number] DOB: 08-28-1971    ADMISSION DATE:  06/15/2014  INITIAL PRESENTATION: 43 yo male presented with fever, dyspnea, chest pain from PNA.  PCCM asked to admit to ICU.  STUDIES:   SIGNIFICANT EVENTS: 9/14 admission: Morbidly obese 77 M  with presumed CAP and acute resp failure. High risk of intubation. Adm by PCCM from ED to ICU for PRN NPPV, abx 9/16 worsening aeration on c x r and high fio2 needs refractory to current tx. Try diuresis.   9/16 nocturnal fever 102 with increased resp acidosis. 9/18 remains on 70% FIO2 despite being 9 liter negative over 48 hours  SUBJECTIVE:  Sitting in chair.  Remains on increased oxygen.  VITAL SIGNS: Temp:  [96.1 F (35.6 C)-99.4 F (37.4 C)] 98.1 F (36.7 C) (09/21 0835) Pulse Rate:  [73-94] 84 (09/21 0835) Resp:  [16-44] 21 (09/21 0835) BP: (105-159)/(48-109) 117/52 mmHg (09/21 0500) SpO2:  [89 %-100 %] 97 % (09/21 0835) FiO2 (%):  [55 %-60 %] 55 % (09/21 0835) Weight:  [260 kg (573 lb 3.1 oz)] 260 kg (573 lb 3.1 oz) (09/21 0400) 55%  INTAKE / OUTPUT:  Intake/Output Summary (Last 24 hours) at 06/22/14 0851 Last data filed at 06/21/14 2300  Gross per 24 hour  Intake   2225 ml  Output   1600 ml  Net    625 ml    PHYSICAL EXAMINATION: General: no distress. Feels better. speaks in full sentences Neuro: alert, follows commands HEENT: Rt eye ptosis, visually impared Cardiovascular: regular Lungs: decreased breath sounds, faint exp wheeze on right  Abdomen: soft, non tender Ext: symmetric brawny edema of Bilat feet  LABS:  CBC  Recent Labs Lab 06/20/14 0348 06/21/14 0405 06/22/14 0720  WBC 11.3* 10.5 10.0  HGB 11.9* 12.0* 11.3*  HCT 37.2* 36.3* 35.1*  PLT 276 251 267   Coag's  Recent Labs Lab 06/15/14 1840 06/18/14 0400  APTT 35 57*   BMET  Recent Labs Lab 06/20/14 0348 06/21/14 0405 06/22/14 0720  NA 141 140 141  K 4.1 4.6 4.6  CL 94*  93* 96  CO2 38* 35* 37*  BUN CREATININE 1.09 1.02 0.98  GLUCOSE 160* 185* 191*   Electrolytes  Recent Labs Lab 06/17/14 0413  06/18/14 0400  06/20/14 0348 06/21/14 0405 06/22/14 0720  CALCIUM 8.5  < > 8.6  < > 8.9 8.7 8.8  MG 2.2  --  2.1  --  2.1  --   --   PHOS 3.6  --  3.4  --  3.3  --   --   < > = values in this interval not displayed.  Sepsis Markers  Recent Labs Lab 06/15/14 1856  06/17/14 2149 06/18/14 0400 06/19/14 0340  LATICACIDVEN 2.29*  --  1.1  --   --   PROCALCITON  --   < > <0.10 <0.10 <0.10  < > = values in this interval not displayed.  ABG  Recent Labs Lab 06/17/14 2057 06/17/14 2309 06/18/14 0433  PHART 7.328* 7.315* 7.326*  PCO2ART 62.4* 66.0* 66.4*  PO2ART 99.9 80.6 75.0*   Liver Enzymes  Recent Labs Lab 06/15/14 1840  AST 15  ALT 9  ALKPHOS 70  BILITOT 0.9  ALBUMIN 3.3*   CXR:  Dg Chest Port 1 View  06/22/2014   CLINICAL DATA:  Followup pneumonia  EXAM: PORTABLE CHEST - 1 VIEW  COMPARISON:  06/21/2014  FINDINGS: Extensive  airspace disease throughout the right chest and in the left mid and lower lung that is stable over multiple studies. There is no evidence of cavitation or effusion. Unchanged cardiomegaly. Mediastinal contours remain distorted from leftward rotation.  IMPRESSION: Multilobar pneumonia, more extensive on the right, that is unchanged over multiple days.   Electronically Signed   By: Tiburcio Pea M.D.   On: 06/22/2014 05:44   Dg Chest Port 1 View  06/21/2014   CLINICAL DATA:  Followup pneumonia.  EXAM: PORTABLE CHEST - 1 VIEW  COMPARISON:  Portable chest x-rays yesterday dating back to 06/15/2014.  FINDINGS: No significant change over the past 3 days in the airspace opacities throughout both lungs, right greater than left. There has been slight overall improvement since the initial examination on 06/15/2014. No new pulmonary parenchymal abnormalities. Cardiac silhouette markedly enlarged but stable.   IMPRESSION: Stable bilateral pneumonia, right greater than left, over the past 3 days. There has been slight overall improvement since the initial examination on 06/15/2014. No new abnormalities.   Electronically Signed   By: Hulan Saas M.D.   On: 06/21/2014 07:12  marked right>left airspace disease.   ASSESSMENT / PLAN:  PULMONARY A: Acute hypoxic resp failure 2nd to pneumonia. Hx of OSA, asthma. P:   Continue BiPAP 20/15 cm H2O qhs and prn during day F/u CXR q 3 days Scheduled BD's Will look at right Chest w/ [place] to r/o effusion  CARDIOVASCULAR Rt PICC 9/17 >>  A:  Hx of A fib/flutter >> NSR presently. P:  Continue home metoprolol, amiodarone Continue rivaroxaban >  changed to  daily Goal even to negative fluid balance >> lasix X 1  RENAL A:   No issues. P:   Monitor renal fx, urine outpt, electrolytes Will d/c foley this afternoon.. After lasix  GASTROINTESTINAL A:   Morbid obesity. P:   F/u with nutrition  HEMATOLOGIC A:   Mild anemia. P:  F/u CBC  INFECTIOUS A:   Pneumonia. Cutaneous candidiasis  P:   Day 8/10 of Abx, currently on vancomycin, cefepime. Will change to levaquin for 2 more days  Add topical nystatin   ENDOCRINE A:  DM type II. P:   SSI  NEUROLOGIC A:  Acute encephalopathy 2nd to respiratory failure >> improved. P:   Monitor clinically Continue outpt wellbutrin, depakote  TODAY'S SUMMARY:  Slowly improving. Will mobilize, diurese. Cont to wean FIO2. Still needs SDU setting based on O2 requirements.   Oretha Milch MD

## 2014-06-22 NOTE — Progress Notes (Signed)
CARE MANAGEMENT NOTE 06/22/2014  Patient:  [name], [name]   Account Number:  [id number]  Date Initiated:  06/22/2014  Documentation initiated by:  DAVIS,RHONDA  Subjective/Objective Assessment:   Morbidly obese 59 M  with presumed CAP and acute resp failure. High risk of intubation. Adm by PCCM from ED to ICU for PRN NPPV, abx     Action/Plan:   tbd per progression and resp status   Anticipated DC Date:  06/25/2014   Anticipated DC Plan:  HOME/SELF CARE  In-house referral  NA      DC Planning Services  CM consult      PAC Choice  NA   Choice offered to / List presented to:  NA   DME arranged  NA      DME agency  NA     HH arranged  NA      HH agency  NA   Status of service:  In process, will continue to follow Medicare Important Message given?   (If response is "NO", the following Medicare IM given date fields will be blank) Date Medicare IM given:   Medicare IM given by:   Date Additional Medicare IM given:   Additional Medicare IM given by:    Discharge Disposition:    Per UR Regulation:  Reviewed for med. necessity/level of care/duration of stay  If discussed at Long Length of Stay Meetings, dates discussed:    Comments:  [id number]/Rhonda Davis,RN,BSN,CCM:

## 2014-06-22 NOTE — Progress Notes (Signed)
Physical Therapy Treatment Patient Details Name: [name] MRN: [id number] DOB: 28-Aug-1971 Today's Date: 06/22/2014    History of Present Illness Pt is a 43 year old male admitted 06/15/14 with acute hypoxic respiratory failure with hx of morbid obesity.    PT Comments    Progressing with mobility. Continues to have high O2 needs at rest and with activity. Tolerated increased activity fairly well. Pt is very motivated.   Follow Up Recommendations  No PT follow up     Equipment Recommendations  None recommended by PT    Recommendations for Other Services       Precautions / Restrictions Precautions Precautions: Fall Precaution Comments: monitor sats Restrictions Weight Bearing Restrictions: No    Mobility  Bed Mobility               General bed mobility comments: pt up in recliner on arrival  Transfers Overall transfer level: Needs assistance Equipment used: Rolling walker (2 wheeled) Transfers: Sit to/from Stand Sit to Stand: Min guard            Ambulation/Gait Ambulation/Gait assistance: Min guard;+2 safety/equipment Ambulation Distance (Feet): 150 Feet Assistive device: Rolling walker (2 wheeled) Gait Pattern/deviations: Step-through pattern;Wide base of support     General Gait Details: 1 brief standing rest break needed after ~75 feet. Dyspnea 3/4. Initally on 10L VM-sats 85%. Increased to 15L VM-sats 91%.    Stairs            Wheelchair Mobility    Modified Rankin (Stroke Patients Only)       Balance                                    Cognition Arousal/Alertness: Awake/alert Behavior During Therapy: WFL for tasks assessed/performed Overall Cognitive Status: Within Functional Limits for tasks assessed                      Exercises      General Comments        Pertinent Vitals/Pain Pain Assessment: No/denies pain    Home Living                      Prior Function            PT  Goals (current goals can now be found in the care plan section) Progress towards PT goals: Progressing toward goals    Frequency  Min 3X/week    PT Plan Current plan remains appropriate    Co-evaluation             End of Session Equipment Utilized During Treatment: Oxygen Activity Tolerance: Patient limited by fatigue Patient left: in chair;with call bell/phone within reach     Time: 1414-1444 PT Time Calculation (min): 30 min  Charges:  $Gait Training: 23-37 mins                    G Codes:      Rebeca Alert, MPT Pager: [phone]

## 2014-06-22 NOTE — Progress Notes (Signed)
Pt requested to be placed on BiPAP at 2300 or 2330. RT will return at that time.

## 2014-06-23 LAB — BASIC METABOLIC PANEL
Anion gap: 12 (ref 5–15)
BUN: 18 mg/dL (ref 6–23)
CO2: 34 mEq/L — ABNORMAL HIGH (ref 19–32)
Calcium: 9.4 mg/dL (ref 8.4–10.5)
Chloride: 92 mEq/L — ABNORMAL LOW (ref 96–112)
Creatinine, Ser: 0.99 mg/dL (ref 0.50–1.35)
Glucose, Bld: 291 mg/dL — ABNORMAL HIGH (ref 70–99)
POTASSIUM: 5.5 meq/L — AB (ref 3.7–5.3)
SODIUM: 138 meq/L (ref 137–147)

## 2014-06-23 LAB — PROCALCITONIN: Procalcitonin: <0.1 ng/mL

## 2014-06-23 LAB — GLUCOSE, CAPILLARY
GLUCOSE-CAPILLARY: 201 mg/dL — AB (ref 70–99)
GLUCOSE-CAPILLARY: 256 mg/dL — AB (ref 70–99)
GLUCOSE-CAPILLARY: 255 mg/dL — AB (ref 70–99)
Glucose-Capillary: 218 mg/dL — ABNORMAL HIGH (ref 70–99)

## 2014-06-23 MED ORDER — BUDESONIDE 0.25 MG/2ML IN SUSP
0.5000 mg | Freq: Two times a day (BID) | RESPIRATORY_TRACT | Status: DC
  Administered 2014-06-23: 0.5 mg via RESPIRATORY_TRACT
  Filled 2014-06-23: qty 4

## 2014-06-23 NOTE — Progress Notes (Signed)
PULMONARY / CRITICAL CARE MEDICINE   Name: [name] MRN: [id number] DOB: December 01, 1970    ADMISSION DATE:  06/15/2014  INITIAL PRESENTATION: 43 yo male presented with fever, dyspnea, chest pain from extensive rt PNA.  PCCM asked to admit to ICU.  STUDIES:   SIGNIFICANT EVENTS: 9/14 admission: Morbidly obese 34 M  with presumed CAP and acute resp failure. High risk of intubation. Adm by PCCM from ED to ICU for PRN NPPV, abx 9/16 worsening aeration on c x r and high fio2 needs refractory to current tx. Try diuresis.   9/16 nocturnal fever 102 with increased resp acidosis. 9/18 remains on 70% FIO2 despite being 9 liter negative over 48 hours  SUBJECTIVE:  Sitting in chair.  Remains on increased oxygen -venti mask. But feels like he's getting better   VITAL SIGNS: Temp:  [97.9 F (36.6 C)-99.3 F (37.4 C)] 98.1 F (36.7 C) (09/22 0800) Pulse Rate:  [73-86] 79 (09/22 0800) Resp:  [16-36] 16 (09/22 0800) BP: (114-143)/(19-103) 143/103 mmHg (09/22 0800) SpO2:  [82 %-99 %] 92 % (09/22 0800) FiO2 (%):  [45 %-60 %] 45 % (09/22 0800) Weight:  [264 kg (582 lb 0.2 oz)] 264 kg (582 lb 0.2 oz) (09/22 0400) 55%  INTAKE / OUTPUT:  Intake/Output Summary (Last 24 hours) at 06/23/14 0851 Last data filed at 06/23/14 0600  Gross per 24 hour  Intake   1510 ml  Output   4200 ml  Net  -2690 ml    PHYSICAL EXAMINATION: General: no distress. Feels better. speaks in full sentences Neuro: alert, follows commands HEENT: Rt eye ptosis, visually impared Cardiovascular: regular Lungs: decreased breath sounds t/o Abdomen: soft, non tender Ext: symmetric brawny edema of Bilat feet  LABS:  CBC  Recent Labs Lab 06/20/14 0348 06/21/14 0405 06/22/14 0720  WBC 11.3* 10.5 10.0  HGB 11.9* 12.0* 11.3*  HCT 37.2* 36.3* 35.1*  PLT 276 251 267   Coag's  Recent Labs Lab 06/18/14 0400  APTT 57*   BMET  Recent Labs Lab 06/20/14 0348 06/21/14 0405 06/22/14 0720  NA 141 140 141  K 4.1  4.6 4.6  CL 94* 93* 96  CO2 38* 35* 37*  BUN CREATININE 1.09 1.02 0.98  GLUCOSE 160* 185* 191*   Electrolytes  Recent Labs Lab 06/17/14 0413  06/18/14 0400  06/20/14 0348 06/21/14 0405 06/22/14 0720  CALCIUM 8.5  < > 8.6  < > 8.9 8.7 8.8  MG 2.2  --  2.1  --  2.1  --   --   PHOS 3.6  --  3.4  --  3.3  --   --   < > = values in this interval not displayed.  Sepsis Markers  Recent Labs Lab 06/17/14 2149 06/18/14 0400 06/19/14 0340  LATICACIDVEN 1.1  --   --   PROCALCITON <0.10 <0.10 <0.10    ABG  Recent Labs Lab 06/17/14 2057 06/17/14 2309 06/18/14 0433  PHART 7.328* 7.315* 7.326*  PCO2ART 62.4* 66.0* 66.4*  PO2ART 99.9 80.6 75.0*   Liver Enzymes No results found for this basename: AST, ALT, ALKPHOS, BILITOT, ALBUMIN,  in the last 168 hours CXR:  Dg Chest Port 1 View  06/22/2014   CLINICAL DATA:  Followup pneumonia  EXAM: PORTABLE CHEST - 1 VIEW  COMPARISON:  06/21/2014  FINDINGS: Extensive airspace disease throughout the right chest and in the left mid and lower lung that is stable over multiple studies. There is no evidence of cavitation  or effusion. Unchanged cardiomegaly. Mediastinal contours remain distorted from leftward rotation.  IMPRESSION: Multilobar pneumonia, more extensive on the right, that is unchanged over multiple days.   Electronically Signed   By: Tiburcio Pea M.D.   On: 06/22/2014 05:44  marked right>left airspace disease.   ASSESSMENT / PLAN:  PULMONARY A: Acute hypoxic resp failure 2nd to pneumonia. Hx of OSA, asthma. P:   Continue BiPAP 20/15 cm H2O qhs and prn during day Cont to attempt to wean O2. May need O2 at d/c - discharge when down to 4-5 l  F/u CXR q 3 days Scheduled BD's   CARDIOVASCULAR Rt PICC 9/17 >>  A:  Hx of A fib/flutter >> NSR presently. P:  Continue home metoprolol, amiodarone Continue rivaroxaban >  changed to  daily Goal even to negative fluid balance. Will repeat chem this am. If renal  fxn ok will give additional dose of lasix.   RENAL A:   No issues. P:   Monitor renal fx, urine outpt, electrolytes Will d/c foley this afternoon.. After lasix  GASTROINTESTINAL A:   Morbid obesity. P:   F/u with nutrition  HEMATOLOGIC A:   Mild anemia. P:  F/u CBC  INFECTIOUS A:   Pneumonia. Cutaneous candidiasis  P:   Day 9/10 of Abx, has one more day levaquin Added topical nystatin  Repeat PCT today  ENDOCRINE A:  DM type II. P:   SSI  NEUROLOGIC A:  Acute encephalopathy 2nd to respiratory failure >> improved. P:   Monitor clinically Continue outpt wellbutrin, depakote  TODAY'S SUMMARY:  Slowly improving. Will mobilize, diurese. Cont to wean FIO2. May be able to move him out of ICU later today. Dc foley  BABCOCK,PETE MD  Care during the described time interval was provided by me and/or other providers on the critical care team.  I have reviewed this patient's available data, including medical history, events of note, physical examination and test results as part of my evaluation  Oretha Milch. MD

## 2014-06-23 NOTE — Progress Notes (Signed)
Inpatient Diabetes Program Recommendations  AACE/ADA: New Consensus Statement on Inpatient Glycemic Control (2013)  Target Ranges:  Prepandial:   less than 140 mg/dL      Peak postprandial:   less than 180 mg/dL (1-2 hours)      Critically ill patients:  140 - 180 mg/dL   Reason for Visit: Hyperglycemia  Diabetes history: DM2 Outpatient Diabetes medications: 75/25 75-40-65 units tidwc  Current orders for Inpatient glycemic control: Novolog resistant tidwc and hs  Results for SYON, TEWS (MRN [id number]) as of 06/23/2014 15:50  Ref. Range 06/22/2014 12:05 06/22/2014 16:06 06/22/2014 21:27 06/23/2014 08:05 06/23/2014 12:05  Glucose-Capillary Latest Range: 70-99 mg/dL 409 (H) 811 (H) 914 (H) 201 (H) 256 (H)   Needs 70/30 insulin with continued hyperglycemia.  Recommendations: Add 70/30 36 units at breakfast, 20 units at lunch and 32 units at dinner. Titrate until CBGs < 180 mg/dL.   Will continue to follow. Thank you. Ailene Ards, RD, LDN, CDE Inpatient Diabetes Coordinator [phone]

## 2014-06-23 NOTE — Progress Notes (Signed)
Patient's midline cephalic line came out. It was laying between his right arm and flank area. Catheter was intact. Scant amount of bleeding. Vaseline gauze and 4x4 gauze applied with pressure. Will continue to monitor patient. Peripheral line attempted unsuccessful. IV Team notified.

## 2014-06-23 NOTE — Progress Notes (Signed)
CSW received referral that pt requesting assistance with getting forms signed.  CSW attempted to meet with pt at bedside. Pt on personal phone call at this time. CSW waiting a few minutes and re-entered the room, but pt remained on telephone.  CSW to follow up with pt at a later time to assist pt as appropriate with getting needed documents signed.  Loletta Specter, MSW, LCSW Clinical Social Work [phone]

## 2014-06-24 ENCOUNTER — Inpatient Hospital Stay (HOSPITAL_COMMUNITY): Payer: Medicare Other

## 2014-06-24 LAB — GLUCOSE, CAPILLARY
GLUCOSE-CAPILLARY: 181 mg/dL — AB (ref 70–99)
Glucose-Capillary: 261 mg/dL — ABNORMAL HIGH (ref 70–99)
Glucose-Capillary: 232 mg/dL — ABNORMAL HIGH (ref 70–99)
Glucose-Capillary: 326 mg/dL — ABNORMAL HIGH (ref 70–99)

## 2014-06-24 LAB — BASIC METABOLIC PANEL
Anion gap: 11 (ref 5–15)
BUN: 22 mg/dL (ref 6–23)
CHLORIDE: 94 meq/L — AB (ref 96–112)
CO2: 33 mEq/L — ABNORMAL HIGH (ref 19–32)
CREATININE: 1.17 mg/dL (ref 0.50–1.35)
Calcium: 9 mg/dL (ref 8.4–10.5)
GFR, EST AFRICAN AMERICAN: 87 mL/min — AB (ref >90)
GFR, EST NON AFRICAN AMERICAN: 75 mL/min — AB (ref >90)
Glucose, Bld: 196 mg/dL — ABNORMAL HIGH (ref 70–99)
POTASSIUM: 4.7 meq/L (ref 3.7–5.3)
Sodium: 138 mEq/L (ref 137–147)

## 2014-06-24 LAB — CULTURE, BLOOD (ROUTINE X 2)
CULTURE: NO GROWTH
Culture: NO GROWTH

## 2014-06-24 MED ORDER — AMOXICILLIN-POT CLAVULANATE 875-125 MG PO TABS
1.0000 | ORAL_TABLET | Freq: Two times a day (BID) | ORAL | Status: DC
  Administered 2014-06-24 – 2014-06-26 (×5): 1 via ORAL
  Filled 2014-06-24 (×6): qty 1

## 2014-06-24 MED ORDER — INSULIN ASPART PROT & ASPART (70-30 MIX) 100 UNIT/ML ~~LOC~~ SUSP
20.0000 [IU] | Freq: Every day | SUBCUTANEOUS | Status: DC
  Administered 2014-06-24 – 2014-06-25 (×2): 20 [IU] via SUBCUTANEOUS
  Filled 2014-06-24: qty 10

## 2014-06-24 MED ORDER — INSULIN ASPART PROT & ASPART (70-30 MIX) 100 UNIT/ML ~~LOC~~ SUSP
36.0000 [IU] | Freq: Every day | SUBCUTANEOUS | Status: DC
  Administered 2014-06-25 – 2014-06-26 (×2): 36 [IU] via SUBCUTANEOUS
  Filled 2014-06-24: qty 10

## 2014-06-24 MED ORDER — FUROSEMIDE 40 MG PO TABS
40.0000 mg | ORAL_TABLET | Freq: Every day | ORAL | Status: DC
  Administered 2014-06-24 – 2014-06-26 (×3): 40 mg via ORAL
  Filled 2014-06-24 (×3): qty 1

## 2014-06-24 MED ORDER — POTASSIUM CHLORIDE CRYS ER 20 MEQ PO TBCR
20.0000 meq | EXTENDED_RELEASE_TABLET | Freq: Every day | ORAL | Status: DC
Start: 2014-06-24 — End: 2014-06-26
  Administered 2014-06-24 – 2014-06-26 (×3): 20 meq via ORAL
  Filled 2014-06-24 (×3): qty 1

## 2014-06-24 MED ORDER — INSULIN ASPART PROT & ASPART (70-30 MIX) 100 UNIT/ML ~~LOC~~ SUSP
32.0000 [IU] | Freq: Every day | SUBCUTANEOUS | Status: DC
  Administered 2014-06-24 – 2014-06-25 (×2): 32 [IU] via SUBCUTANEOUS
  Filled 2014-06-24: qty 10

## 2014-06-24 MED ORDER — BUDESONIDE 0.5 MG/2ML IN SUSP
0.5000 mg | Freq: Two times a day (BID) | RESPIRATORY_TRACT | Status: DC
  Administered 2014-06-24 – 2014-06-26 (×5): 0.5 mg via RESPIRATORY_TRACT
  Filled 2014-06-24 (×6): qty 2

## 2014-06-24 NOTE — Progress Notes (Signed)
PULMONARY / CRITICAL CARE MEDICINE   Name: [name] MRN: [id number] DOB: Apr 22, 1971    ADMISSION DATE:  06/15/2014  INITIAL PRESENTATION: 43 yo male presented with fever, dyspnea, chest pain from extensive rt PNA.  PCCM asked to admit to ICU.  STUDIES:   SIGNIFICANT EVENTS: 9/14 admission: Morbidly obese 66 M  with presumed CAP and acute resp failure. High risk of intubation. Adm by PCCM from ED to ICU for PRN NPPV, abx 9/16 worsening aeration on c x r and high fio2 needs refractory to current tx. Try diuresis.   9/16 nocturnal fever 102 with increased resp acidosis. Rt PICC 9/17 >>  9/18 remains on 70% FIO2 despite being 9 liter negative over 48 hours 9/23 down to 6 liters feeling better  SUBJECTIVE:  Sitting in chair.  Now on N/c feeling better    VITAL SIGNS: Temp:  [98.4 F (36.9 C)-99.6 F (37.6 C)] 99.1 F (37.3 C) (09/23 0400) Pulse Rate:  [69-81] 69 (09/22 2355) Resp:  [17-37] 17 (09/22 2355) BP: (98-162)/(71-93) 162/93 mmHg (09/22 2355) SpO2:  [92 %-100 %] 99 % (09/23 0246) FiO2 (%):  [40 %-60 %] 60 % (09/23 0246) 55%  INTAKE / OUTPUT:  Intake/Output Summary (Last 24 hours) at 06/24/14 0911 Last data filed at 06/24/14 0500  Gross per 24 hour  Intake    100 ml  Output   1251 ml  Net  -1151 ml    PHYSICAL EXAMINATION: General: no distress. Feels better. speaks in full sentences Neuro: alert, follows commands HEENT: Rt eye ptosis, visually impared Cardiovascular: regular Lungs: decreased breath sounds t/o Abdomen: soft, non tender Ext: symmetric brawny edema of Bilat feet  LABS:  CBC  Recent Labs Lab 06/20/14 0348 06/21/14 0405 06/22/14 0720  WBC 11.3* 10.5 10.0  HGB 11.9* 12.0* 11.3*  HCT 37.2* 36.3* 35.1*  PLT 276 251 267   Coag's  Recent Labs Lab 06/18/14 0400  APTT 57*   BMET  Recent Labs Lab 06/22/14 0720 06/23/14 0955 06/24/14 0623  NA 141 138 138  K 4.6 5.5* 4.7  CL 96 92* 94*  CO2 37* 34* 33*  BUN CREATININE 0.98 0.99 1.17  GLUCOSE 191* 291* 196*   Electrolytes  Recent Labs Lab 06/18/14 0400  06/20/14 0348  06/22/14 0720 06/23/14 0955 06/24/14 0623  CALCIUM 8.6  < > 8.9  < > 8.8 9.4 9.0  MG 2.1  --  2.1  --   --   --   --   PHOS 3.4  --  3.3  --   --   --   --   < > = values in this interval not displayed.  Sepsis Markers  Recent Labs Lab 06/17/14 2149 06/18/14 0400 06/19/14 0340 06/23/14 0955  LATICACIDVEN 1.1  --   --   --   PROCALCITON <0.10 <0.10 <0.10 <0.10    ABG  Recent Labs Lab 06/17/14 2057 06/17/14 2309 06/18/14 0433  PHART 7.328* 7.315* 7.326*  PCO2ART 62.4* 66.0* 66.4*  PO2ART 99.9 80.6 75.0*   Liver Enzymes No results found for this basename: AST, ALT, ALKPHOS, BILITOT, ALBUMIN,  in the last 168 hours CXR:  Dg Chest Port 1 View  06/24/2014   CLINICAL DATA:  Pneumonia  EXAM: PORTABLE CHEST - 1 VIEW  COMPARISON:  06/22/2014  FINDINGS: Cardiomediastinal silhouette is stable. Again noted multifocal pneumonia most extensive in right lung. No evidence of cavitation or large pleural effusion.  IMPRESSION: No significant change. Multifocal  pneumonia again noted right greater than left.   Electronically Signed   By: Natasha Mead M.D.   On: 06/24/2014 07:41  marked right>left airspace disease. -->improved aeration   ASSESSMENT / PLAN:   Acute hypoxic resp failure 2nd to pneumonia. Hx of OSA, asthma. Clinically improved. Ready to transfer to medical ward  Plan:   Change to nocturnal CPAP Scheduled BD's Day 10/14 levaquin -stop since concern for qT interaction with amio, start augmentin for four more days although pct low Lasix as BUN/creatinine allow   Hx of A fib/flutter >> NSR presently. Plan:  Continue home metoprolol, amiodarone Continue rivaroxaban >  changed to  daily Goal even to negative fluid balance. Will repeat chem this am. If renal fxn ok will give additional dose of lasix.   Mild anemia. Plan:  F/u CBC  Cutaneous  candidiasis  Plan Added topical nystatin  Repeat PCT today  DM type II. Plan:   SSI Add NPH 70/30  36-20-32  Depression  Plan:   Continue outpt wellbutrin, depakote  TODAY'S SUMMARY:  Slowly improving. Will mobilize, diurese. Cont to wean FIO2. May be able to move him out of SDU. Hope home in next 24-48hrs   BABCOCK,PETE   Independently examined pt, evaluated data & formulated above care plan with NP,note edited by me.  Oretha Milch MD

## 2014-06-24 NOTE — Progress Notes (Signed)
Physical Therapy Treatment Patient Details Name: [name] MRN: [id number] DOB: Apr 11, 1971 Today's Date: 06/24/2014    History of Present Illness Pt is a 43 year old male admitted 06/15/14 with acute hypoxic respiratory failure with hx of morbid obesity.    PT Comments    Progressing with mobility. Sats continue to drop with activity.   Follow Up Recommendations  No PT follow up     Equipment Recommendations  Rolling walker with 5" wheels (bari)    Recommendations for Other Services       Precautions / Restrictions Precautions Precautions: Fall Precaution Comments: monitor sats Restrictions Weight Bearing Restrictions: No    Mobility  Bed Mobility               General bed mobility comments: pt up in recliner on arrival  Transfers Overall transfer level: Needs assistance Equipment used: Rolling walker (2 wheeled) Transfers: Sit to/from Stand Sit to Stand: Supervision            Ambulation/Gait Ambulation/Gait assistance: Min guard;+2 safety/equipment Ambulation Distance (Feet): 450 Feet Assistive device: Rolling walker (2 wheeled)       General Gait Details: 1 brief standing rest break needed after ~100 feet. Dyspnea 3/4. 74% on 6L O2 during ambulation. several standing rest breaks needed to allow for recovery.    Stairs            Wheelchair Mobility    Modified Rankin (Stroke Patients Only)       Balance                                    Cognition Arousal/Alertness: Awake/alert Behavior During Therapy: WFL for tasks assessed/performed Overall Cognitive Status: Within Functional Limits for tasks assessed                      Exercises      General Comments        Pertinent Vitals/Pain Pain Assessment: No/denies pain    Home Living                      Prior Function            PT Goals (current goals can now be found in the care plan section) Progress towards PT goals: Progressing  toward goals    Frequency  Min 3X/week    PT Plan Current plan remains appropriate    Co-evaluation             End of Session Equipment Utilized During Treatment: Oxygen Activity Tolerance: Patient tolerated treatment well Patient left: in chair;with call bell/phone within reach     Time: 7425-9563 PT Time Calculation (min): 18 min  Charges:  $Gait Training: 8-22 mins                    G Codes:      Rebeca Alert, MPT Pager: [phone]

## 2014-06-25 ENCOUNTER — Inpatient Hospital Stay (HOSPITAL_COMMUNITY): Payer: Medicare Other

## 2014-06-25 LAB — GLUCOSE, CAPILLARY
GLUCOSE-CAPILLARY: 217 mg/dL — AB (ref 70–99)
GLUCOSE-CAPILLARY: 213 mg/dL — AB (ref 70–99)
Glucose-Capillary: 162 mg/dL — ABNORMAL HIGH (ref 70–99)
Glucose-Capillary: 219 mg/dL — ABNORMAL HIGH (ref 70–99)

## 2014-06-25 LAB — PROCALCITONIN: Procalcitonin: <0.1 ng/mL

## 2014-06-25 MED ORDER — LAMOTRIGINE 25 MG PO TABS
50.0000 mg | ORAL_TABLET | Freq: Every day | ORAL | Status: DC
  Administered 2014-06-25: 50 mg via ORAL
  Filled 2014-06-25 (×2): qty 2

## 2014-06-25 MED ORDER — CYCLOBENZAPRINE HCL 10 MG PO TABS
10.0000 mg | ORAL_TABLET | Freq: Three times a day (TID) | ORAL | Status: DC | PRN
  Administered 2014-06-25: 10 mg via ORAL
  Filled 2014-06-25: qty 1

## 2014-06-25 MED ORDER — IBUPROFEN 200 MG PO TABS
600.0000 mg | ORAL_TABLET | Freq: Four times a day (QID) | ORAL | Status: DC | PRN
  Administered 2014-06-25: 600 mg via ORAL
  Filled 2014-06-25: qty 3

## 2014-06-25 NOTE — Progress Notes (Signed)
PULMONARY / CRITICAL CARE MEDICINE   Name: [name] MRN: [id number] DOB: 08/20/1971    ADMISSION DATE:  06/15/2014  INITIAL PRESENTATION: 43 yo male presented with fever, dyspnea, chest pain from extensive rt PNA.  PCCM asked to admit to ICU.  STUDIES:   SIGNIFICANT EVENTS: 9/14 admission: Morbidly obese 57 M  with presumed CAP and acute resp failure. High risk of intubation. Adm by PCCM from ED to ICU for PRN NPPV, abx 9/16 worsening aeration on c x r and high fio2 needs refractory to current tx. Try diuresis.   9/16 nocturnal fever 102 with increased resp acidosis. Rt PICC 9/17 >>  9/18 remains on 70% FIO2 despite being 9 liter negative over 48 hours 9/23 down to 6 liters feeling better 9/24 c/o right sided CP and sciatic pain   SUBJECTIVE:  Sitting in chair.  Now on N/c feeling better   C/o chest pain radiating down his right flank & his leg "I have sciatica'  VITAL SIGNS: Temp:  [97.6 F (36.4 C)-98.5 F (36.9 C)] 97.6 F (36.4 C) (09/24 0700) Pulse Rate:  [25-82] 72 (09/24 0811) Resp:  [11-36] 19 (09/24 0811) BP: (91-137)/(46-97) 91/46 mmHg (09/24 0811) SpO2:  [91 %-99 %] 94 % (09/24 0811) FiO2 (%):  [60 %] 60 % (09/24 0139) 5 liters  INTAKE / OUTPUT:  Intake/Output Summary (Last 24 hours) at 06/25/14 0838 Last data filed at 06/25/14 0700  Gross per 24 hour  Intake    240 ml  Output   2150 ml  Net  -1910 ml    PHYSICAL EXAMINATION: General: no distress. Feels better. speaks in full sentences Neuro: alert, follows commands HEENT: Rt eye ptosis, visually impared Cardiovascular: regular Lungs: decreased breath sounds t/o Abdomen: soft, non tender Ext: symmetric brawny edema of Bilat feet  LABS:  CBC  Recent Labs Lab 06/20/14 0348 06/21/14 0405 06/22/14 0720  WBC 11.3* 10.5 10.0  HGB 11.9* 12.0* 11.3*  HCT 37.2* 36.3* 35.1*  PLT 276 251 267   Coag's No results found for this basename: APTT, INR,  in the last 168 hours BMET  Recent  Labs Lab 06/22/14 0720 06/23/14 0955 06/24/14 0623  NA 141 138 138  K 4.6 5.5* 4.7  CL 96 92* 94*  CO2 37* 34* 33*  BUN CREATININE 0.98 0.99 1.17  GLUCOSE 191* 291* 196*   Electrolytes  Recent Labs Lab 06/20/14 0348  06/22/14 0720 06/23/14 0955 06/24/14 0623  CALCIUM 8.9  < > 8.8 9.4 9.0  MG 2.1  --   --   --   --   PHOS 3.3  --   --   --   --   < > = values in this interval not displayed.  Sepsis Markers  Recent Labs Lab 06/19/14 0340 06/23/14 0955 06/25/14 0322  PROCALCITON <0.10 <0.10 <0.10    ABG No results found for this basename: PHART, PCO2ART, PO2ART,  in the last 168 hours Liver Enzymes No results found for this basename: AST, ALT, ALKPHOS, BILITOT, ALBUMIN,  in the last 168 hours CXR:  Dg Chest Port 1 View  06/24/2014   CLINICAL DATA:  Pneumonia  EXAM: PORTABLE CHEST - 1 VIEW  COMPARISON:  06/22/2014  FINDINGS: Cardiomediastinal silhouette is stable. Again noted multifocal pneumonia most extensive in right lung. No evidence of cavitation or large pleural effusion.  IMPRESSION: No significant change. Multifocal pneumonia again noted right greater than left.   Electronically Signed   By: Lang Snow  Pop M.D.   On: 06/24/2014 07:41  marked right>left airspace disease. -->improved aeration   ASSESSMENT / PLAN:   Acute hypoxic resp failure 2nd to pneumonia. Hx of OSA, asthma. Right sided CP. ? costochondritis  Clinically improved. Ready to transfer to medical ward  Plan:   Change to nocturnal CPAP Scheduled BD's Get 2 view CXR today  Day 11/14 abx Lasix as BUN/creatinine allow  Add NSAID   Hx of A fib/flutter >> NSR presently. Plan:  Continue home metoprolol, amiodarone Continue rivaroxaban >  changed to  daily Goal even to negative fluid balance. Will repeat chem this am. If renal fxn ok will give additional dose of lasix.   Mild anemia. Plan:  F/u CBC  Cutaneous candidiasis  Plan Added topical nystatin, will cont    DM type  II. Plan:   SSI Added NPH 70/30  36-20-32  Depression  Pain Plan:   Continue outpt wellbutrin, depakote Resume flexeril  TODAY'S SUMMARY:  Still looking better but c/o right sided CP and chronic sciatic pain... He has a lot of anxiety about leaving so wonder if there is a component of psycho-somatic effect here... Will get CXR, add NSAIDs for costochondritis and  Cont to monitor. Hope we can get him home tomorrow.  BABCOCK,PETE    Independently examined pt, evaluated data & formulated above care plan with NP   Oretha Milch MD

## 2014-06-25 NOTE — Progress Notes (Addendum)
CARE MANAGEMENT NOTE 06/25/2014  Patient:  KEWAN, MCNEASE   Account Number:  [id number]  Date Initiated:  06/22/2014  Documentation initiated by:  DAVIS,RHONDA  Subjective/Objective Assessment:   Morbidly obese 54 M  with presumed CAP and acute resp failure. High risk of intubation. Adm by PCCM from ED to ICU for PRN NPPV, abx     Action/Plan:   tbd per progression and resp status   Anticipated DC Date:  06/28/2014   Anticipated DC Plan:  HOME W HOME HEALTH SERVICES  In-house referral  NA      DC Planning Services  CM consult      [facility] Choice  NA   Choice offered to / List presented to:  NA   DME arranged  NA      DME agency  NA     HH arranged  NA      HH agency  [facility]   Status of service:  In process, will continue to follow Medicare Important Message given?   (If response is "NO", the following Medicare IM given date fields will be blank) Date Medicare IM given:   Medicare IM given by:   Date Additional Medicare IM given:   Additional Medicare IM given by:    Discharge Disposition:    Per UR Regulation:  Reviewed for med. necessity/level of care/duration of stay  If discussed at Long Length of Stay Meetings, dates discussed:    Comments:  [id number]/Rhonda L. Earlene Plater, RN, BSN, CCM: Discharge needs reviewed at time of this chart review. Patient is Active with [facility], RN is aware.  [id number]/[id number] Davis,RN,BSN,CCM:

## 2014-06-25 NOTE — Progress Notes (Signed)
Patient active with [facility] Care Management. Spoke with patient at bedside. Agreeable to writer making post hospital discharge follow up appointments. Dr Paulino Rily appointment set for 07/02/04 at 11 am. Dr Shelle Iron follow up appointment set for 07/14/14 at 1130 am. Patient provided appointments as well as information placed in EPIC follow up. [facility] Care Management to continue to follow post discharge. These services do not interfere or replace home health. He can also benefit from home health services as well as it appears he will be going home on home 02 per nursing. Will make inpatient RNCM aware.  Junie PanningPacific Northwest [facility] [phone]

## 2014-06-25 NOTE — Progress Notes (Signed)
Clinical Social Work Department BRIEF PSYCHOSOCIAL ASSESSMENT 06/25/2014  Patient:  [name], [name]     Account Number:  [id number]     Admit date:  06/15/2014  Clinical Social Worker:  Ulyess Blossom  Date/Time:  06/25/2014 11:30 AM  Referred by:  Physician  Date Referred:  06/25/2014 Referred for  Other - See comment   Other Referral:   Pt requesting assistance completing some paperwork   Interview type:  Patient Other interview type:    PSYCHOSOCIAL DATA Living Status:  ALONE Admitted from facility:   Level of care:   Primary support name:  [place]/cousin/[phone] Primary support relationship to patient:  FAMILY Degree of support available:   adequate    CURRENT CONCERNS Current Concerns  Other - See comment   Other Concerns:    SOCIAL WORK ASSESSMENT / PLAN CSW received referral that pt requesting assistance with completing some paperwork.    CSW met with pt at bedside. CSW introduced self and explained role. Pt shared that he initially requested CSW because he had received paperwork from [employer]. Pt shared that he was aware that he has to follow instructions from paperwork and needed to get copies of some things once he returns home and get paperwork sent back to [employer]. Pt expressed appreciation for CSW visited to check if pt needed any assistance with paperwork, but states that he is aware of what he needs to do and plans to complete paperwork once he returns home and return it to [employer].    No further social work needs identified by pt at this time.    CSW signing off.   Assessment/plan status:  No Further Intervention Required Other assessment/ plan:   Information/referral to community resources:   Pt aware of community resources and on waiting list for [employer] housing    PATIENT'S/FAMILY'S RESPONSE TO PLAN OF CARE: Pt alert and oriented x 4. CSW provided positive reinforcement to pt for moving forward with paperwork for [employer]  housing. Pt appreciative of CSW visit and time and aware of resources and how to follow up.    Alison Murray, MSW, [place] Work [phone]

## 2014-06-26 LAB — BASIC METABOLIC PANEL
Anion gap: 9 (ref 5–15)
BUN: 23 mg/dL (ref 6–23)
CALCIUM: 9.4 mg/dL (ref 8.4–10.5)
CO2: 34 meq/L — AB (ref 19–32)
CREATININE: 1.13 mg/dL (ref 0.50–1.35)
Chloride: 95 mEq/L — ABNORMAL LOW (ref 96–112)
GFR calc Af Amer: >90 mL/min (ref >90)
GFR calc non Af Amer: 78 mL/min — ABNORMAL LOW (ref >90)
GLUCOSE: 286 mg/dL — AB (ref 70–99)
Potassium: 5 mEq/L (ref 3.7–5.3)
Sodium: 138 mEq/L (ref 137–147)

## 2014-06-26 LAB — GLUCOSE, CAPILLARY: GLUCOSE-CAPILLARY: 315 mg/dL — AB (ref 70–99)

## 2014-06-26 MED ORDER — RIVAROXABAN 20 MG PO TABS: 20.0000 mg | ORAL_TABLET | Freq: Every day | ORAL | Status: AC

## 2014-06-26 MED ORDER — IBUPROFEN 600 MG PO TABS: 600.0000 mg | ORAL_TABLET | Freq: Four times a day (QID) | ORAL | Status: DC | PRN

## 2014-06-26 MED ORDER — AMOXICILLIN-POT CLAVULANATE 875-125 MG PO TABS: 1.0000 | ORAL_TABLET | Freq: Two times a day (BID) | ORAL | Status: DC

## 2014-06-26 MED ORDER — NYSTATIN 100000 UNIT/GM EX POWD: CUTANEOUS | Status: DC

## 2014-06-26 NOTE — Significant Event (Signed)
     To whom it may concern,   This letter is to confirm that Mr. [name] (DOB: [birth year]), has been a patient at [facility] from 9/14 to 9/25. He was hospitalized for severe community acquired pneumonia and resultant respiratory failure. He is ready for hospital discharge as of 9/25 but he will be discharged on oxygen and we anticipate it will take weeks for him to recover to his pre-illness state. He will be followed closely in the out patient setting by both his primary MD and his pulmonologist. 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   Thank you,   Anders Simmonds ACNP-BC [facility] Pulmonary/Critical Care 06/26/2014  Office # [phone]

## 2014-06-26 NOTE — Progress Notes (Signed)
Pt placed on BiPAP. Machine plugged into red outlet.

## 2014-06-26 NOTE — Progress Notes (Signed)
SATURATION QUALIFICATIONS: (This note is used to comply with regulatory documentation for home oxygen)  Patient Saturations on Room Air at Rest = 87%  Patient Saturations on Room Air while Ambulating = 83%  Patient Saturations on 4 Liters of oxygen while Ambulating = 90%  Please briefly explain why patient needs home oxygen:

## 2014-06-26 NOTE — Discharge Summary (Signed)
Physician Discharge Summary       Patient ID: [name] MRN: [id number] DOB/AGE: July 24, 1971 43 y.o.  Admit date: 06/15/2014 Discharge date: 06/26/2014  Discharge Diagnoses:  Acute Hypoxic Respiratory Failure Community Acquired Pneumonia (NOS) Severe OSA Asthma Costochondritis  History of afib/flutter Anemia (NOS) DM type II w/ hyperglycemia  Depression  Cutaneous candidiasis (groin area)  Detailed Hospital Course:   68 M with extensive PMH mostly stemming from morbid obesity. Admitted via [facility] ED with acute hypoxic resp failure and extensive Right >Left AirSpace disease on CXR. On the evening prior to admission, he noted fever for which he took ibuprofen, He awoke on the morning of admission with severe dyspnea and retrosternal CP both of which were unabated with use of his BD MDI. Therefore he came to [facility] ED where his exam revealed fever to 103, severe dyspnea. CXR revealed extensive consolidation throughout R lung with patchy infiltrate on L. He was started on BiPAP and PCCM called to admit. He was admitted to the intensive care. Blood cultures were sent. He was unable to expectorate an adequate sputum culture. He was treated in the usual fashion with empiric antibiotics, IV hydration, and support alternating from high flow oxygen to non-invasive positive pressure ventilation. He was initially treated with cefepime and vancomycin, this was transitioned to Levaquin on 9/21 and augmentin on 9/23. As he improved we added aggressive diuresis to assist w/ oxygenation. He slowly improved to the point we felt he could be safely discharged to home as of 9/25. We checked walking oximetry prior to d/c. At rest his oxygen saturations were 87%-88% on room air. With activity they dropped as low as 75% and then recovered back to >88% with rest. He required 4 liters of oxygen to ensure his O2 sats stayed above 88%.    Discharge Plan by active problems   Acute hypoxic resp failure 2nd to pneumonia.    Hx of OSA, asthma.  Right sided CP. ? costochondritis  Ready for d/c. CXR remarkably improved.  Plan:  Day 12/14 abx will go home w/ augmentin  PRN NSAIDs PRN BDs Oxygen @ 4 liters  Resume home CPAP 16 cm, bleed in 4 liters of oxygen  Has f/u with his PCP    Hx of A fib/flutter >> NSR presently.  Plan:  Continue home metoprolol, amiodarone  Continue rivaroxaban  daily    Cutaneous candidiasis (groin) Plan topical nystatin, will cont   DM type II.  Plan:  Resume his home regimen   Depression  Pain  Plan:  Continue outpt wellbutrin, depakote  Resume flexeril   Significant Hospital tests/ studies  SIGNIFICANT EVENTS:  9/14 admission: Morbidly obese 13 M with presumed CAP and acute resp failure. High risk of intubation. Adm by PCCM from ED to ICU for PRN NPPV, abx  9/16 worsening aeration on c x r and high fio2 needs refractory to current tx. Try diuresis.  9/16 nocturnal fever 102 with increased resp acidosis.  Rt PICC 9/17 >> 9/25 9/18 remains on 70% FIO2 despite being 9 liter negative over 48 hours  9/23 down to 6 liters feeling better  9/24 c/o right sided CP and sciatic pain 9/25: moved to medical ward. Ambulated. Room air sats 88% at rest. Dropped to 75% with activity. Required 4 liters to bring sats > 88%.    Discharge Exam: BP 131/71  Pulse 75  Temp(Src) 97.4 F (36.3 C) (Oral)  Resp 20  Ht  (1.905 m)  Wt 264 kg (582  lb 0.2 oz)  BMI 72.75 kg/m2  SpO2 93%  General: no distress. Feels better. speaks in full sentences  Neuro: alert, follows commands  HEENT: Rt eye ptosis, visually impared  Cardiovascular: regular  Lungs: decreased breath sounds t/o but improved  Abdomen: soft, non tender  Ext: symmetric brawny edema of Bilat feet   Labs at discharge Lab Results  Component Value Date   CREATININE 1.13 06/26/2014   BUN 23 06/26/2014   NA 138 06/26/2014   K 5.0 06/26/2014   CL 95* 06/26/2014   CO2 34* 06/26/2014   Lab Results  Component  Value Date   WBC 10.0 06/22/2014   HGB 11.3* 06/22/2014   HCT 35.1* 06/22/2014   MCV 85.8 06/22/2014   PLT 267 06/22/2014   Lab Results  Component Value Date   ALT 9 06/15/2014   AST 15 06/15/2014   ALKPHOS 70 06/15/2014   BILITOT 0.9 06/15/2014   Lab Results  Component Value Date   INR 1.02 03/27/2014   INR 1.13 03/26/2014   INR 1.83* 04/25/2012    Current radiology studies Dg Chest 2 View  06/25/2014   CLINICAL DATA:  Shortness of breath.  Mid to right-sided chest pain.  EXAM: CHEST  2 VIEW  COMPARISON:  06/24/2014 and earlier  FINDINGS: The heart is enlarged. Patchy density is identified throughout the right lung. Persistent density is identified bilaterally, right greater than left. There has been improvement in aeration bilaterally. There is more focal opacity in the left mid lung zone. Continued followup is recommended to document clearing of this process.  IMPRESSION: Improving aeration.  Focal density in the left midlung zone. Continued follow-up recommended.  Cardiomegaly.   Electronically Signed   By: Rosalie Gums M.D.   On: 06/25/2014 10:07    Disposition:  01-Home or Self Care      Discharge Instructions   Diet - low sodium heart healthy    Complete by:  As directed      Discharge instructions    Complete by:  As directed   May return to school on 10/19     For home use only DME oxygen    Complete by:  As directed   4 liters via n/c during day. Bleed in 4 liters to CPAP at night  Mode or (Route):  Nasal cannula  Frequency:  Continuous (stationary and portable oxygen unit needed)  Oxygen conserving device:  No  Oxygen delivery system:  Gas     Increase activity slowly    Complete by:  As directed             Medication List         albuterol 108 (90 BASE) MCG/ACT inhaler  Commonly known as:  VENTOLIN HFA  Inhale 2 puffs into the lungs every 6 (six) hours as needed. Shortness of breath     ALPRAZolam 1 MG tablet  Commonly known as:  XANAX  Take 1 tablet (1 mg  total) by mouth 3 (three) times daily as needed for anxiety.     amiodarone 200 MG tablet  Commonly known as:  PACERONE  Take 200 mg by mouth daily.     amLODipine 10 MG tablet  Commonly known as:  NORVASC  Take 10 mg by mouth daily.     amoxicillin-clavulanate 875-125 MG per tablet  Commonly known as:  AUGMENTIN  Take 1 tablet by mouth every 12 (twelve) hours.     buPROPion 300 MG 24 hr tablet  Commonly known  as:  WELLBUTRIN XL  Take 300 mg by mouth Daily.     cyclobenzaprine 10 MG tablet  Commonly known as:  FLEXERIL  Take 10 mg by mouth 3 (three) times daily as needed for muscle spasms.     divalproex 500 MG DR tablet  Commonly known as:  DEPAKOTE  Take 1,000 mg by mouth at bedtime.     DSS 100 MG Caps  Take 100 mg by mouth 2 (two) times daily as needed for mild constipation.     HUMALOG MIX 75/25 (75-25) 100 UNIT/ML Susp injection  Generic drug:  insulin lispro protamine-lispro  Inject 40-75 Units into the skin Three times a day before meals. Takes 75 units before breakfast Takes 40 units before lunch Takes 65 units before supper     hydrALAZINE 10 MG tablet  Commonly known as:  APRESOLINE  Take 1 tablet (10 mg total) by mouth every 8 (eight) hours.     ibuprofen 600 MG tablet  Commonly known as:  ADVIL,MOTRIN  Take 1 tablet (600 mg total) by mouth every 6 (six) hours as needed for moderate pain.     lamoTRIgine 25 MG tablet  Commonly known as:  LAMICTAL  Take 50 mg by mouth at bedtime.     levalbuterol 1.25 MG/0.5ML nebulizer solution  Commonly known as:  XOPENEX  Take 1.25 mg by nebulization every 6 (six) hours as needed for wheezing or shortness of breath.     metoprolol succinate 50 MG 24 hr tablet  Commonly known as:  TOPROL-XL  Take 50 mg by mouth daily.     NAFTIN 2 % Crea  Generic drug:  Naftifine HCl  Apply 1 application topically daily.     nitroGLYCERIN 0.4 MG SL tablet  Commonly known as:  NITROSTAT  Place 0.4 mg under the tongue once.      nystatin 100000 UNIT/GM Powd  Apply topically to groin three times a day     polyethylene glycol packet  Commonly known as:  MIRALAX / GLYCOLAX  Take 17 g by mouth daily.     rivaroxaban 20 MG Tabs tablet  Commonly known as:  XARELTO  Take 1 tablet (20 mg total) by mouth daily with supper.     traMADol 50 MG tablet  Commonly known as:  ULTRAM  Take 50-100 mg by mouth every 6 (six) hours as needed for pain.       Follow-up Information   Follow up with Emeterio Reeve, MD On 07/02/2014. (at 11 am)    Specialty:  Family Medicine   Contact information:   [address] [place] [address] [phone]       Follow up with Barbaraann Share, MD On 07/14/2014. (at 1130 am)    Specialty:  Pulmonary Disease   Contact information:   [address] [place] [address] [phone]       Discharged Condition: good    Signed: BABCOCK,PETE 06/26/2014, 10:09 AM  Physician Statement:   The Patient was personally examined, the discharge assessment and plan has been personally reviewed and I agree with ACNP Babcock's assessment and plan. > 30 minutes of time have been dedicated to discharge assessment, planning and discharge instructions.

## 2014-06-26 NOTE — Progress Notes (Signed)
[facility]  [facility] is providing the following services: Home O2  If patient discharges after hours, please call [phone].   Renard Hamper 06/26/2014, 10:36 AM

## 2014-06-26 NOTE — Care Management Note (Signed)
CARE MANAGEMENT NOTE 06/26/2014  Patient:  [name], [name]   Account Number:  [id number]  Date Initiated:  06/22/2014  Documentation initiated by:  DAVIS,RHONDA  Subjective/Objective Assessment:   Morbidly obese 64 M  with presumed CAP and acute resp failure. High risk of intubation. Adm by PCCM from ED to ICU for PRN NPPV, abx     Action/Plan:   tbd per progression and resp status   Anticipated DC Date:  06/28/2014   Anticipated DC Plan:  HOME W HOME HEALTH SERVICES  In-house referral  NA      DC Planning Services  CM consult      [facility] Choice  NA   Choice offered to / List presented to:  C-1 Patient   DME arranged  OXYGEN      DME agency  [facility]     [facility] arranged  HH-1 RN      [facility] agency  [facility]   Status of service:  In process, will continue to follow Medicare Important Message given?  YES (If response is "NO", the following Medicare IM given date fields will be blank) Date Medicare IM given:  06/26/2014 Medicare IM given by:  Sandford Craze Date Additional Medicare IM given:   Additional Medicare IM given by:    Discharge Disposition:    Per UR Regulation:  Reviewed for med. necessity/level of care/duration of stay  If discussed at Long Length of Stay Meetings, dates discussed:    Comments:  06/26/14 Sandford Craze RN,BSN,NCM [phone] Orders written for [facility] and home O2.  [facility] rep and [facility] DME rep called with referral.  O2 tank to be brought for pt to take home. No other DC needs identified.  [id number] L. Earlene Plater, RN, BSN, CCM: Discharge needs reviewed at time of this chart review. Patient is Active with Delman Cheadle, RN is aware.  [id number] Davis,RN,BSN,CCM:

## 2014-06-26 NOTE — Progress Notes (Signed)
Pt discharged home with sister and mother in stable condition. Discharge instructions,scripts, and home 02 given. Pt verbalized understanding.

## 2014-07-06 ENCOUNTER — Encounter: Payer: Medicare Other | Admitting: Cardiology

## 2014-07-06 NOTE — Progress Notes (Signed)
HPI: FU chest pain and paoxysmal atrial fibrillation. Had TEE guided cardioversion of atrial flutter 10/10 successfully. Note the TEE revealed normal LV and RV function. He was seen by EP and amiodarone was recommended to help maintain sinus rhythm. Multiple previous admissions for atypical chest pain. Last echocardiogram in June 2015 showed normal LV function and mildly dilated aortic root. Carotid dopplers 6/15 showed 1-39 bilateral stenosis. Patient also with history of diastolic CHF. Admitted 9/15 with acute respiratory failure and pneumonia   Current Outpatient Prescriptions  Medication Sig Dispense Refill  . albuterol (VENTOLIN HFA) 108 (90 BASE) MCG/ACT inhaler Inhale 2 puffs into the lungs every 6 (six) hours as needed. Shortness of breath  1 Inhaler  0  . ALPRAZolam (XANAX) 1 MG tablet Take 1 tablet (1 mg total) by mouth 3 (three) times daily as needed for anxiety.  30 tablet  0  . amiodarone (PACERONE) 200 MG tablet Take 200 mg by mouth daily.      Marland Kitchen. amLODipine (NORVASC) 10 MG tablet Take 10 mg by mouth daily.       Marland Kitchen. amoxicillin-clavulanate (AUGMENTIN) 875-125 MG per tablet Take 1 tablet by mouth every 12 (twelve) hours.  4 tablet  0  . buPROPion (WELLBUTRIN XL) 300 MG 24 hr tablet Take 300 mg by mouth Daily.       . cyclobenzaprine (FLEXERIL) 10 MG tablet Take 10 mg by mouth 3 (three) times daily as needed for muscle spasms.      . divalproex (DEPAKOTE) 500 MG DR tablet Take 1,000 mg by mouth at bedtime.       . docusate sodium 100 MG CAPS Take 100 mg by mouth 2 (two) times daily as needed for mild constipation.  10 capsule  0  . HUMALOG MIX 75/25 (75-25) 100 UNIT/ML SUSP Inject 40-75 Units into the skin Three times a day before meals. Takes 75 units before breakfast Takes 40 units before lunch Takes 65 units before supper      . hydrALAZINE (APRESOLINE) 10 MG tablet Take 1 tablet (10 mg total) by mouth every 8 (eight) hours.  90 tablet  0  . ibuprofen (ADVIL,MOTRIN) 600 MG  tablet Take 1 tablet (600 mg total) by mouth every 6 (six) hours as needed for moderate pain.  30 tablet  0  . lamoTRIgine (LAMICTAL) 25 MG tablet Take 50 mg by mouth at bedtime.      . levalbuterol (XOPENEX) 1.25 MG/0.5ML nebulizer solution Take 1.25 mg by nebulization every 6 (six) hours as needed for wheezing or shortness of breath.  1 each  12  . metoprolol (TOPROL-XL) 50 MG 24 hr tablet Take 50 mg by mouth daily.      . Naftifine HCl (NAFTIN) 2 % CREA Apply 1 application topically daily.      . nitroGLYCERIN (NITROSTAT) 0.4 MG SL tablet Place 0.4 mg under the tongue once.      . nystatin (MYCOSTATIN/NYSTOP) 100000 UNIT/GM POWD Apply topically to groin three times a day  30 g  0  . polyethylene glycol (MIRALAX / GLYCOLAX) packet Take 17 g by mouth daily.      . rivaroxaban (XARELTO) 20 MG TABS tablet Take 1 tablet (20 mg total) by mouth daily with supper.  30 tablet  6  . traMADol (ULTRAM) 50 MG tablet Take 50-100 mg by mouth every 6 (six) hours as needed for pain.       No current facility-administered medications for this visit.  Past Medical History  Diagnosis Date  . CHF (congestive heart failure)   . HTN (hypertension)   . Ptosis   . Atrial flutter   . Atrial fibrillation   . Morbid obesity   . DM2 (diabetes mellitus, type 2)   . Depression   . Respiratory failure   . Chronic low back pain   . Asthma   . Vision abnormalities   . Abscess     right thigh    Past Surgical History  Procedure Laterality Date  . Tonsillectomy    . Cholecystectomy    . Tonsillectomy    . Irrigation and debridement abscess  10/13/2011    Procedure: IRRIGATION AND DEBRIDEMENT ABSCESS;  Surgeon: Emelia Loron, MD;  Location: [facility] OR;  Service: General;  Laterality: Right;  Irrigation and debridement right thigh abscess  . Application of wound vac      right thigh abscess    History   Social History  . Marital Status: Divorced    Spouse Name: N/A    Number of Children: N/A  . Years  of Education: N/A   Occupational History  . unemployeed    Social History Main Topics  . Smoking status: Never Smoker   . Smokeless tobacco: Not on file     Comment: no significant tobacco use   . Alcohol Use: Yes     Comment: no significant use-- rare  . Drug Use: No  . Sexual Activity: Not on file   Other Topics Concern  . Not on file   Social History Narrative   Lives in [place]; works full time at [employer].    Occasionally takes echinacea and B12 supplements.    Regular diet; has not exercised regularly over the last 3 months.     ROS: no fevers or chills, productive cough, hemoptysis, dysphasia, odynophagia, melena, hematochezia, dysuria, hematuria, rash, seizure activity, orthopnea, PND, pedal edema, claudication. Remaining systems are negative.  Physical Exam: Well-developed well-nourished in no acute distress.  Skin is warm and dry.  HEENT is normal.  Neck is supple.  Chest is clear to auscultation with normal expansion.  Cardiovascular exam is regular rate and rhythm.  Abdominal exam nontender or distended. No masses palpated. Extremities show no edema. neuro grossly intact  ECG     This encounter was created in error - please disregard.

## 2014-07-14 ENCOUNTER — Encounter: Payer: Self-pay | Admitting: Pulmonary Disease

## 2014-07-14 ENCOUNTER — Ambulatory Visit (INDEPENDENT_AMBULATORY_CARE_PROVIDER_SITE_OTHER)
Admission: RE | Admit: 2014-07-14 | Discharge: 2014-07-14 | Disposition: A | Discharge status: Home / Self Care | Payer: Medicare Other | Source: Ambulatory Visit | Attending: Pulmonary Disease | Admitting: Pulmonary Disease

## 2014-07-14 ENCOUNTER — Ambulatory Visit (INDEPENDENT_AMBULATORY_CARE_PROVIDER_SITE_OTHER): Payer: Medicare Other | Admitting: Pulmonary Disease

## 2014-07-14 VITALS — BP 124/62 | HR 70 | Temp 98.3°F | Ht 75.0 in

## 2014-07-14 DIAGNOSIS — E662 Morbid (severe) obesity with alveolar hypoventilation: Secondary | ICD-10-CM

## 2014-07-14 DIAGNOSIS — J189 Pneumonia, unspecified organism: Secondary | ICD-10-CM

## 2014-07-14 DIAGNOSIS — G4733 Obstructive sleep apnea (adult) (pediatric): Secondary | ICD-10-CM

## 2014-07-14 NOTE — Patient Instructions (Signed)
Will check a cxr today to make sure that your pneumonia has resolved. Will have your concentrator checked, and get you a new mask and supplies. Continue on your bipap everynight with oxygen Work on weight loss followup with me again in 6mos.

## 2014-07-14 NOTE — Assessment & Plan Note (Signed)
The pt is much improved.  Needs cxr followup to resolution.

## 2014-07-14 NOTE — Assessment & Plan Note (Signed)
The patient has been wearing his bilevel device at home for this, and I have stressed to him the importance of wearing his oxygen through his device as well. Will have his home care company check his concentrator, and also arrange for new supplies. I have stressed to him the importance of aggressive weight loss.

## 2014-07-14 NOTE — Progress Notes (Signed)
   Subjective:    Patient ID: [name], male    DOB: 10/12/1970, 43 y.o.   MRN: [id number]  HPI Patient comes in today for followup after his recent hospitalization for community-acquired pneumonia. He also has obesity hypoventilation syndrome with obstructive sleep apnea leading to chronic respiratory failure. The patient has done fairly well since discharge, and is no longer bringing up purulent mucus. He does need a chest x-ray followup for completeness. The patient has been wearing his bilevel compliantly, but has not kept up with his mask changes. He is also complaining about his concentrator making too much noise at night, and does not wear his oxygen through his bilevel device. He feels that his breathing is near his usual baseline.   Review of Systems  Constitutional: Negative for fever and unexpected weight change.  HENT: Positive for congestion, postnasal drip, sinus pressure and trouble swallowing. Negative for dental problem, ear pain, nosebleeds, rhinorrhea, sneezing and sore throat.   Eyes: Negative for redness and itching.  Respiratory: Positive for cough, chest tightness, shortness of breath and wheezing.   Cardiovascular: Negative for palpitations and leg swelling.  Gastrointestinal: Negative for nausea and vomiting.  Genitourinary: Negative for dysuria.  Musculoskeletal: Negative for joint swelling.  Skin: Negative for rash.  Neurological: Negative for headaches.  Hematological: Does not bruise/bleed easily.  Psychiatric/Behavioral: Negative for dysphoric mood. The patient is not nervous/anxious.        Objective:   Physical Exam Massively obese male in no acute distress Nose without purulence or discharge noted No skin breakdown or pressure necrosis from the CPAP mask Neck without lymphadenopathy or thyromegaly Chest with mildly decreased breath sounds, no wheeze Cardiac exam with regular rate and rhythm. Lower extremities with significant edema, no  cyanosis Alert and oriented, moves all 4 extremities       Assessment & Plan:

## 2014-07-19 NOTE — Progress Notes (Signed)
HPI: FU chest pain and paoxysmal atrial fibrillation. He underwent a TEE guided cardioversion of atrial flutter 10/10 successfully. Note the TEE revealed normal LV and RV function. He was seen by EP and amiodarone was recommended to help maintain sinus rhythm. Multiple previous admissions for atypical chest pain. Patient also with history of diastolic CHF. Last echo 6/15 showed normal LV function and mildly dilated aortic root. Carotid dopplers 6/15 showed 1-39 bilateral stenois. Admitted 9/15 with respiratory failure/pneumonia. Since last seen, the patient denies any dyspnea on exertion, orthopnea, PND, pedal edema, palpitations, syncope or chest pain.    Current Outpatient Prescriptions  Medication Sig Dispense Refill  . albuterol (VENTOLIN HFA) 108 (90 BASE) MCG/ACT inhaler Inhale 2 puffs into the lungs every 6 (six) hours as needed. Shortness of breath  1 Inhaler  0  . amiodarone (PACERONE) 200 MG tablet Take 200 mg by mouth daily.      Marland Kitchen. amLODipine (NORVASC) 10 MG tablet Take 10 mg by mouth daily.       Marland Kitchen. buPROPion (WELLBUTRIN XL) 300 MG 24 hr tablet Take 300 mg by mouth Daily.       . cyclobenzaprine (FLEXERIL) 10 MG tablet Take 10 mg by mouth 3 (three) times daily as needed for muscle spasms.      . divalproex (DEPAKOTE) 500 MG DR tablet Take 1,000 mg by mouth at bedtime.       Marland Kitchen. HUMALOG MIX 75/25 (75-25) 100 UNIT/ML SUSP Inject 40-75 Units into the skin Three times a day before meals. Takes 75 units before breakfast Takes 40 units before lunch Takes 65 units before supper      . hydrALAZINE (APRESOLINE) 10 MG tablet Take 1 tablet (10 mg total) by mouth every 8 (eight) hours.  90 tablet  0  . ibuprofen (ADVIL,MOTRIN) 600 MG tablet Take 1 tablet (600 mg total) by mouth every 6 (six) hours as needed for moderate pain.  30 tablet  0  . lamoTRIgine (LAMICTAL) 25 MG tablet Take 50 mg by mouth at bedtime.      . metoprolol (TOPROL-XL) 50 MG 24 hr tablet Take 50 mg by mouth daily.      .  Naftifine HCl (NAFTIN) 2 % CREA Apply 1 application topically daily.      . nitroGLYCERIN (NITROSTAT) 0.4 MG SL tablet Place 0.4 mg under the tongue once.      . nystatin (MYCOSTATIN/NYSTOP) 100000 UNIT/GM POWD Apply topically to groin three times a day  30 g  0  . rivaroxaban (XARELTO) 20 MG TABS tablet Take 1 tablet (20 mg total) by mouth daily with supper.  30 tablet  6   No current facility-administered medications for this visit.     Past Medical History  Diagnosis Date  . CHF (congestive heart failure)   . HTN (hypertension)   . Ptosis   . Atrial flutter   . Atrial fibrillation   . Morbid obesity   . DM2 (diabetes mellitus, type 2)   . Depression   . Respiratory failure   . Chronic low back pain   . Asthma   . Vision abnormalities   . Abscess     right thigh    Past Surgical History  Procedure Laterality Date  . Tonsillectomy    . Cholecystectomy    . Tonsillectomy    . Irrigation and debridement abscess  10/13/2011    Procedure: IRRIGATION AND DEBRIDEMENT ABSCESS;  Surgeon: Emelia LoronMatthew Wakefield, MD;  [facility];  Service:  General;  Laterality: Right;  Irrigation and debridement right thigh abscess  . Application of wound vac      right thigh abscess    History   Social History  . Marital Status: Divorced    Spouse Name: N/A    Number of Children: N/A  . Years of Education: N/A   Occupational History  . unemployeed    Social History Main Topics  . Smoking status: Never Smoker   . Smokeless tobacco: Not on file     Comment: no significant tobacco use   . Alcohol Use: Yes     Comment: no significant use-- rare  . Drug Use: No  . Sexual Activity: Not on file   Other Topics Concern  . Not on file   Social History Narrative   Lives in [place]; works full time at [employer].    Occasionally takes echinacea and B12 supplements.    Regular diet; has not exercised regularly over the last 3 months.     ROS: no fevers or chills, productive cough,  hemoptysis, dysphasia, odynophagia, melena, hematochezia, dysuria, hematuria, rash, seizure activity, orthopnea, PND, pedal edema, claudication. Remaining systems are negative.  Physical Exam: Well-developed morbidly obese in no acute distress.  Skin is warm and dry.  HEENT is normal.  Neck is supple.  Chest is clear to auscultation with normal expansion.  Cardiovascular exam is regular rate and rhythm.  Abdominal exam nontender or distended. No masses palpated. Extremities show no edema. neuro grossly intact  ECG 06/16/2014-sinus rhythm with no ST changes.

## 2014-07-20 ENCOUNTER — Encounter: Payer: Self-pay | Admitting: Cardiology

## 2014-07-20 ENCOUNTER — Ambulatory Visit (INDEPENDENT_AMBULATORY_CARE_PROVIDER_SITE_OTHER): Payer: Medicare Other | Admitting: Cardiology

## 2014-07-20 VITALS — BP 142/60 | HR 92 | Ht 75.0 in | Wt >= 6400 oz

## 2014-07-20 DIAGNOSIS — I4891 Unspecified atrial fibrillation: Secondary | ICD-10-CM

## 2014-07-20 DIAGNOSIS — I10 Essential (primary) hypertension: Secondary | ICD-10-CM

## 2014-07-20 DIAGNOSIS — Z6841 Body Mass Index (BMI) 40.0 and over, adult: Secondary | ICD-10-CM

## 2014-07-20 LAB — TSH: TSH: 1.538 u[IU]/mL (ref 0.350–4.500)

## 2014-07-20 NOTE — Assessment & Plan Note (Signed)
Patient remains in sinus rhythm on most recent electrocardiogram. Continued amiodarone. Recent liver functions and chest x-ray unremarkable. Check TSH. Continue xarelto. His hemoglobin and renal function have been checked recently.

## 2014-07-20 NOTE — Assessment & Plan Note (Signed)
No recent chest pain  

## 2014-07-20 NOTE — Assessment & Plan Note (Signed)
Patient counseled on weight loss. He is planning potential evaluation for gastric bypass.

## 2014-07-20 NOTE — Assessment & Plan Note (Signed)
See notes under atrial fibrillation.

## 2014-07-20 NOTE — Assessment & Plan Note (Signed)
Blood pressure controlled. Continue present medications. 

## 2014-07-20 NOTE — Patient Instructions (Signed)
Your physician wants you to follow-up in: 6 MONTHS WITH DR CRENSHAW You will receive a reminder letter in the mail two months in advance. If you don't receive a letter, please call our office to schedule the follow-up appointment.   Your physician recommends that you HAVE LAB WORK TODAY 

## 2014-09-03 ENCOUNTER — Other Ambulatory Visit (INDEPENDENT_AMBULATORY_CARE_PROVIDER_SITE_OTHER): Payer: Self-pay | Admitting: Surgery

## 2014-09-03 DIAGNOSIS — E538 Deficiency of other specified B group vitamins: Secondary | ICD-10-CM

## 2014-09-03 DIAGNOSIS — E559 Vitamin D deficiency, unspecified: Secondary | ICD-10-CM

## 2014-09-03 DIAGNOSIS — I482 Chronic atrial fibrillation, unspecified: Secondary | ICD-10-CM

## 2014-09-03 DIAGNOSIS — E1110 Type 2 diabetes mellitus with ketoacidosis without coma: Secondary | ICD-10-CM

## 2014-09-03 DIAGNOSIS — R739 Hyperglycemia, unspecified: Secondary | ICD-10-CM

## 2014-09-03 DIAGNOSIS — I10 Essential (primary) hypertension: Secondary | ICD-10-CM

## 2014-09-08 ENCOUNTER — Encounter: Payer: Medicare Other | Attending: Surgery | Admitting: Dietician

## 2014-09-08 ENCOUNTER — Encounter: Payer: Self-pay | Admitting: Dietician

## 2014-09-08 DIAGNOSIS — Z6841 Body Mass Index (BMI) 40.0 and over, adult: Secondary | ICD-10-CM | POA: Insufficient documentation

## 2014-09-08 DIAGNOSIS — Z713 Dietary counseling and surveillance: Secondary | ICD-10-CM | POA: Diagnosis not present

## 2014-09-08 DIAGNOSIS — E119 Type 2 diabetes mellitus without complications: Secondary | ICD-10-CM | POA: Diagnosis not present

## 2014-09-08 NOTE — Progress Notes (Signed)
  Pre-Op Assessment Visit:  Pre-Operative Sleeve Gastrectomy Surgery  Medical Nutrition Therapy:  Appt start time: 0945   End time:  1030.  Patient was seen on 09/08/2014 for Pre-Operative Nutrition Assessment. Assessment [facility] coordinator on 09/08/2014.   Preferred Learning Style:   No preference indicated   Learning Readiness:   Ready  Handouts given during visit include:  Pre-Op Goals Bariatric Surgery Protein Shakes   During the appointment today the following Pre-Op Goals were reviewed with the patient: Maintain or lose weight as instructed by your surgeon Make healthy food choices Begin to limit portion sizes Limited concentrated sugars and fried foods Keep fat/sugar in the single digits per serving on   food labels Practice CHEWING your food  (aim for 30 chews per bite or until applesauce consistency) Practice not drinking 15 minutes before, during, and 30 minutes after each meal/snack Avoid all carbonated beverages  Avoid/limit caffeinated beverages  Avoid all sugar-sweetened beverages Consume 3 meals per day; eat every 3-5 hours Make a list of non-food related activities Aim for 64-100 ounces of FLUID daily  Aim for at least 60-80 grams of PROTEIN daily Look for a liquid protein source that contain ?15 g protein and ?5 g carbohydrate  (ex: shakes, drinks, shots)  Patient-Centered Goals:  [name] would like to be able to ride amusement park rides.  [name] would like to be able to walk up steps again.   [name] feels that the pre-op goals are 10 level of importance and 8 level of confidence in able to do the pre-op goals.   specific/non-scale and confidence/importance scale 1-10  Demonstrated degree of understanding via:  Teach Back  Teaching Method Utilized:  Visual Auditory Hands on  Barriers to learning/adherence to lifestyle change: emotional eating  Patient to call the [facility] to enroll in Pre-Op and Post-Op Nutrition Education when surgery date is scheduled.

## 2014-09-08 NOTE — Patient Instructions (Addendum)
Follow Pre-Op Goals Try Protein Shakes [facility] at [phone] when surgery is scheduled to enroll in Pre-Op Class Talk to Sherion about supervised weight loss appointments. Try PB2.  Plan to talk to your counselor each about emotional eating and binge eating every 1-2 weeks.   Things to remember:  Please always be honest with us. We want to support you!  If you have any questions or concerns in between appointments, please call or email Jennette BankerLiz, Leslie, or Jacki ConesLaurie.  The diet after surgery will be high protein and low in carbohydrate.  Vitamins and calcium need to be taken for the rest of your life.  Feel free to include support people in any classes or appointments.

## 2014-09-22 ENCOUNTER — Ambulatory Visit (HOSPITAL_COMMUNITY)
Admission: RE | Admit: 2014-09-22 | Discharge: 2014-09-22 | Disposition: A | Discharge status: Home / Self Care | Payer: Medicare Other | Source: Ambulatory Visit | Attending: Surgery | Admitting: Surgery

## 2014-09-22 DIAGNOSIS — E538 Deficiency of other specified B group vitamins: Secondary | ICD-10-CM

## 2014-09-22 DIAGNOSIS — I482 Chronic atrial fibrillation, unspecified: Secondary | ICD-10-CM

## 2014-09-22 DIAGNOSIS — E1310 Other specified diabetes mellitus with ketoacidosis without coma: Secondary | ICD-10-CM | POA: Diagnosis not present

## 2014-09-22 DIAGNOSIS — E1110 Type 2 diabetes mellitus with ketoacidosis without coma: Secondary | ICD-10-CM

## 2014-09-22 DIAGNOSIS — R739 Hyperglycemia, unspecified: Secondary | ICD-10-CM

## 2014-09-22 DIAGNOSIS — I10 Essential (primary) hypertension: Secondary | ICD-10-CM

## 2014-09-22 DIAGNOSIS — E559 Vitamin D deficiency, unspecified: Secondary | ICD-10-CM

## 2014-10-06 ENCOUNTER — Encounter (HOSPITAL_COMMUNITY)
Admission: RE | Disposition: A | Discharge status: Home / Self Care | Payer: Self-pay | Source: Ambulatory Visit | Attending: Surgery

## 2014-10-06 ENCOUNTER — Ambulatory Visit (HOSPITAL_COMMUNITY)
Admission: RE | Admit: 2014-10-06 | Discharge: 2014-10-06 | Disposition: A | Discharge status: Home / Self Care | Payer: Medicare Other | Source: Ambulatory Visit | Attending: Surgery | Admitting: Surgery

## 2014-10-06 DIAGNOSIS — Z01818 Encounter for other preprocedural examination: Secondary | ICD-10-CM | POA: Diagnosis present

## 2014-10-06 HISTORY — PX: BREATH TEK H PYLORI: SHX5422

## 2014-10-06 SURGERY — BREATH TEST, FOR HELICOBACTER PYLORI

## 2014-10-07 ENCOUNTER — Encounter (HOSPITAL_COMMUNITY): Payer: Self-pay | Admitting: Surgery

## 2014-10-09 ENCOUNTER — Other Ambulatory Visit: Payer: Self-pay | Admitting: Internal Medicine

## 2014-10-12 ENCOUNTER — Encounter: Payer: Medicare Other | Attending: Surgery | Admitting: Dietician

## 2014-10-12 DIAGNOSIS — Z713 Dietary counseling and surveillance: Secondary | ICD-10-CM | POA: Insufficient documentation

## 2014-10-12 DIAGNOSIS — E119 Type 2 diabetes mellitus without complications: Secondary | ICD-10-CM | POA: Diagnosis not present

## 2014-10-12 DIAGNOSIS — Z6841 Body Mass Index (BMI) 40.0 and over, adult: Secondary | ICD-10-CM | POA: Diagnosis not present

## 2014-10-12 NOTE — Patient Instructions (Signed)
-  Continue to keep glucose on hand at all times

## 2014-10-12 NOTE — Progress Notes (Signed)
Appt start time: 0800 end time:  0815.  Supervised Weight Loss:  Primary concerns today: [name] is here for his 1st SWL visit out of 6 in preparation for gastric sleeve. He has lost 12 pounds. He has been walking and trying to cut out carbs. He reports his blood sugars have been between 80-180mg /dL. Goal is to lose 70 more pounds prior to surgery. HgbA1c 7.6%.   MEDICATIONS: see list  DIETARY INTAKE: Not assessed at this visit  Recent physical activity: walking  Estimated energy needs: 2000-2200 calories  Progress Towards Goal(s):  In progress.   Nutritional Diagnosis:  Norco-3.3 Overweight/obesity related to past poor dietary habits and physical inactivity as evidenced by patient in supervised weight loss for pending bariatric surgery following dietary guidelines for continued weight loss.     Intervention:  Nutrition counseling provided.  Plan: continue being active and continue working on pre op goals   Monitoring/Evaluation:  Dietary intake, exercise, and body weight in 4 week(s).

## 2014-10-14 ENCOUNTER — Other Ambulatory Visit: Payer: Self-pay | Admitting: Internal Medicine

## 2014-11-11 ENCOUNTER — Ambulatory Visit: Payer: Medicare Other | Admitting: Dietician

## 2014-11-18 ENCOUNTER — Ambulatory Visit: Payer: Medicare Other | Admitting: Dietician

## 2014-11-26 ENCOUNTER — Inpatient Hospital Stay (HOSPITAL_COMMUNITY)
Admission: EM | Admit: 2014-11-26 | Discharge: 2014-11-29 | DRG: 202 | Disposition: A | Discharge status: Home / Self Care | Payer: Medicare Other | Attending: Internal Medicine | Admitting: Internal Medicine

## 2014-11-26 ENCOUNTER — Encounter (HOSPITAL_COMMUNITY): Payer: Self-pay | Admitting: Emergency Medicine

## 2014-11-26 ENCOUNTER — Ambulatory Visit: Payer: Medicare Other | Admitting: Dietician

## 2014-11-26 ENCOUNTER — Emergency Department (HOSPITAL_COMMUNITY): Payer: Medicare Other

## 2014-11-26 DIAGNOSIS — E118 Type 2 diabetes mellitus with unspecified complications: Secondary | ICD-10-CM

## 2014-11-26 DIAGNOSIS — G4733 Obstructive sleep apnea (adult) (pediatric): Secondary | ICD-10-CM | POA: Diagnosis present

## 2014-11-26 DIAGNOSIS — E1165 Type 2 diabetes mellitus with hyperglycemia: Secondary | ICD-10-CM | POA: Diagnosis present

## 2014-11-26 DIAGNOSIS — G8929 Other chronic pain: Secondary | ICD-10-CM | POA: Diagnosis present

## 2014-11-26 DIAGNOSIS — J40 Bronchitis, not specified as acute or chronic: Secondary | ICD-10-CM | POA: Diagnosis present

## 2014-11-26 DIAGNOSIS — J45901 Unspecified asthma with (acute) exacerbation: Secondary | ICD-10-CM | POA: Diagnosis present

## 2014-11-26 DIAGNOSIS — R0689 Other abnormalities of breathing: Secondary | ICD-10-CM

## 2014-11-26 DIAGNOSIS — Z9981 Dependence on supplemental oxygen: Secondary | ICD-10-CM | POA: Diagnosis not present

## 2014-11-26 DIAGNOSIS — R079 Chest pain, unspecified: Secondary | ICD-10-CM

## 2014-11-26 DIAGNOSIS — R0902 Hypoxemia: Secondary | ICD-10-CM

## 2014-11-26 DIAGNOSIS — Z6841 Body Mass Index (BMI) 40.0 and over, adult: Secondary | ICD-10-CM

## 2014-11-26 DIAGNOSIS — T380X5A Adverse effect of glucocorticoids and synthetic analogues, initial encounter: Secondary | ICD-10-CM | POA: Diagnosis present

## 2014-11-26 DIAGNOSIS — R0602 Shortness of breath: Secondary | ICD-10-CM | POA: Diagnosis present

## 2014-11-26 DIAGNOSIS — I480 Paroxysmal atrial fibrillation: Secondary | ICD-10-CM | POA: Diagnosis present

## 2014-11-26 DIAGNOSIS — I10 Essential (primary) hypertension: Secondary | ICD-10-CM | POA: Diagnosis present

## 2014-11-26 DIAGNOSIS — J9621 Acute and chronic respiratory failure with hypoxia: Secondary | ICD-10-CM | POA: Diagnosis present

## 2014-11-26 DIAGNOSIS — I5032 Chronic diastolic (congestive) heart failure: Secondary | ICD-10-CM | POA: Diagnosis present

## 2014-11-26 HISTORY — DX: Sleep apnea, unspecified: G47.30

## 2014-11-26 LAB — I-STAT TROPONIN, ED: Troponin i, poc: 0 ng/mL (ref 0.00–0.08)

## 2014-11-26 LAB — CBC
HCT: 41.8 % (ref 39.0–52.0)
HEMOGLOBIN: 13.6 g/dL (ref 13.0–17.0)
MCH: 27.5 pg (ref 26.0–34.0)
MCHC: 32.5 g/dL (ref 30.0–36.0)
MCV: 84.6 fL (ref 78.0–100.0)
PLATELETS: 195 10*3/uL (ref 150–400)
RBC: 4.94 MIL/uL (ref 4.22–5.81)
RDW: 15.8 % — AB (ref 11.5–15.5)
WBC: 11.8 10*3/uL — ABNORMAL HIGH (ref 4.0–10.5)

## 2014-11-26 LAB — COMPREHENSIVE METABOLIC PANEL
ALT: 21 U/L (ref 0–53)
AST: 16 U/L (ref 0–37)
Albumin: 3.7 g/dL (ref 3.5–5.2)
Alkaline Phosphatase: 59 U/L (ref 39–117)
Anion gap: 5 (ref 5–15)
BUN: 13 mg/dL (ref 6–23)
CALCIUM: 8.7 mg/dL (ref 8.4–10.5)
CHLORIDE: 106 mmol/L (ref 96–112)
CO2: 28 mmol/L (ref 19–32)
Creatinine, Ser: 1 mg/dL (ref 0.50–1.35)
GFR calc Af Amer: >90 mL/min (ref >90)
GFR calc non Af Amer: 90 mL/min — ABNORMAL LOW (ref >90)
Glucose, Bld: 254 mg/dL — ABNORMAL HIGH (ref 70–99)
POTASSIUM: 4.3 mmol/L (ref 3.5–5.1)
Sodium: 139 mmol/L (ref 135–145)
Total Bilirubin: 0.9 mg/dL (ref 0.3–1.2)
Total Protein: 7.5 g/dL (ref 6.0–8.3)

## 2014-11-26 LAB — BRAIN NATRIURETIC PEPTIDE: B Natriuretic Peptide: 81.8 pg/mL (ref 0.0–100.0)

## 2014-11-26 LAB — GLUCOSE, CAPILLARY: Glucose-Capillary: 294 mg/dL — ABNORMAL HIGH (ref 70–99)

## 2014-11-26 LAB — TROPONIN I

## 2014-11-26 MED ORDER — INSULIN ASPART 100 UNIT/ML ~~LOC~~ SOLN
0.0000 [IU] | Freq: Every day | SUBCUTANEOUS | Status: DC
  Administered 2014-11-27: 4 [IU] via SUBCUTANEOUS

## 2014-11-26 MED ORDER — ALBUTEROL SULFATE (2.5 MG/3ML) 0.083% IN NEBU: 2.5000 mg | INHALATION_SOLUTION | Freq: Four times a day (QID) | RESPIRATORY_TRACT | Status: DC | PRN

## 2014-11-26 MED ORDER — CYCLOBENZAPRINE HCL 10 MG PO TABS
10.0000 mg | ORAL_TABLET | Freq: Three times a day (TID) | ORAL | Status: DC | PRN
  Administered 2014-11-28 (×2): 10 mg via ORAL
  Filled 2014-11-26 (×2): qty 1

## 2014-11-26 MED ORDER — RIVAROXABAN 20 MG PO TABS
20.0000 mg | ORAL_TABLET | Freq: Every day | ORAL | Status: DC
  Administered 2014-11-27 – 2014-11-28 (×3): 20 mg via ORAL
  Filled 2014-11-26 (×3): qty 1

## 2014-11-26 MED ORDER — ALBUTEROL SULFATE (2.5 MG/3ML) 0.083% IN NEBU
2.5000 mg | INHALATION_SOLUTION | Freq: Four times a day (QID) | RESPIRATORY_TRACT | Status: DC
  Administered 2014-11-27 – 2014-11-29 (×10): 2.5 mg via RESPIRATORY_TRACT
  Filled 2014-11-26 (×12): qty 3

## 2014-11-26 MED ORDER — SODIUM CHLORIDE 0.9 % IJ SOLN
3.0000 mL | Freq: Two times a day (BID) | INTRAMUSCULAR | Status: DC
  Administered 2014-11-27 – 2014-11-28 (×3): 3 mL via INTRAVENOUS

## 2014-11-26 MED ORDER — NAFTIFINE HCL 2 % EX CREA: 1.0000 "application " | TOPICAL_CREAM | Freq: Every day | CUTANEOUS | Status: DC

## 2014-11-26 MED ORDER — ALBUTEROL SULFATE (2.5 MG/3ML) 0.083% IN NEBU: 2.5000 mg | INHALATION_SOLUTION | RESPIRATORY_TRACT | Status: DC | PRN

## 2014-11-26 MED ORDER — SODIUM CHLORIDE 0.9 % IJ SOLN: 3.0000 mL | INTRAMUSCULAR | Status: DC | PRN

## 2014-11-26 MED ORDER — IPRATROPIUM BROMIDE 0.02 % IN SOLN
0.5000 mg | Freq: Once | RESPIRATORY_TRACT | Status: AC
  Administered 2014-11-26: 0.5 mg via RESPIRATORY_TRACT
  Filled 2014-11-26: qty 2.5

## 2014-11-26 MED ORDER — SODIUM CHLORIDE 0.9 % IV SOLN
250.0000 mL | INTRAVENOUS | Status: DC | PRN
Start: 2014-11-26 — End: 2014-11-29

## 2014-11-26 MED ORDER — FUROSEMIDE 10 MG/ML IJ SOLN
40.0000 mg | Freq: Once | INTRAMUSCULAR | Status: DC
Start: 2014-11-26 — End: 2014-11-26

## 2014-11-26 MED ORDER — HYDRALAZINE HCL 10 MG PO TABS
10.0000 mg | ORAL_TABLET | Freq: Every day | ORAL | Status: DC
  Administered 2014-11-27 – 2014-11-29 (×3): 10 mg via ORAL
  Filled 2014-11-26 (×3): qty 1

## 2014-11-26 MED ORDER — ONDANSETRON HCL 4 MG/2ML IJ SOLN
4.0000 mg | Freq: Once | INTRAMUSCULAR | Status: AC
  Administered 2014-11-26: 4 mg via INTRAVENOUS
  Filled 2014-11-26: qty 2

## 2014-11-26 MED ORDER — INSULIN ASPART 100 UNIT/ML ~~LOC~~ SOLN
0.0000 [IU] | Freq: Three times a day (TID) | SUBCUTANEOUS | Status: DC
  Administered 2014-11-27: 7 [IU] via SUBCUTANEOUS
  Administered 2014-11-27: 9 [IU] via SUBCUTANEOUS
  Administered 2014-11-28 (×2): 7 [IU] via SUBCUTANEOUS

## 2014-11-26 MED ORDER — METHYLPREDNISOLONE SODIUM SUCC 125 MG IJ SOLR
80.0000 mg | Freq: Three times a day (TID) | INTRAMUSCULAR | Status: DC
  Administered 2014-11-27: 80 mg via INTRAVENOUS
  Administered 2014-11-27: 62.5 mg via INTRAVENOUS
  Administered 2014-11-27 – 2014-11-28 (×2): 80 mg via INTRAVENOUS
  Filled 2014-11-26 (×4): qty 2

## 2014-11-26 MED ORDER — MORPHINE SULFATE 4 MG/ML IJ SOLN
4.0000 mg | Freq: Once | INTRAMUSCULAR | Status: AC
  Administered 2014-11-26: 4 mg via INTRAVENOUS
  Filled 2014-11-26: qty 1

## 2014-11-26 MED ORDER — AMIODARONE HCL 200 MG PO TABS: 200.0000 mg | ORAL_TABLET | Freq: Every day | ORAL | Status: DC

## 2014-11-26 MED ORDER — AMLODIPINE BESYLATE 10 MG PO TABS
10.0000 mg | ORAL_TABLET | Freq: Every day | ORAL | Status: DC
  Administered 2014-11-27 – 2014-11-29 (×3): 10 mg via ORAL
  Filled 2014-11-26 (×3): qty 1

## 2014-11-26 MED ORDER — LAMOTRIGINE 25 MG PO TABS
25.0000 mg | ORAL_TABLET | Freq: Every day | ORAL | Status: DC
  Administered 2014-11-27 – 2014-11-29 (×3): 25 mg via ORAL
  Filled 2014-11-26 (×3): qty 1

## 2014-11-26 MED ORDER — ALBUTEROL SULFATE (2.5 MG/3ML) 0.083% IN NEBU
5.0000 mg | INHALATION_SOLUTION | Freq: Once | RESPIRATORY_TRACT | Status: AC
  Administered 2014-11-26: 5 mg via RESPIRATORY_TRACT
  Filled 2014-11-26: qty 6

## 2014-11-26 MED ORDER — METHYLPREDNISOLONE SODIUM SUCC 125 MG IJ SOLR
125.0000 mg | Freq: Once | INTRAMUSCULAR | Status: AC
  Administered 2014-11-26: 125 mg via INTRAVENOUS
  Filled 2014-11-26: qty 2

## 2014-11-26 MED ORDER — DIVALPROEX SODIUM 500 MG PO DR TAB
1000.0000 mg | DELAYED_RELEASE_TABLET | Freq: Every day | ORAL | Status: DC
  Administered 2014-11-27 – 2014-11-29 (×3): 1000 mg via ORAL
  Filled 2014-11-26 (×3): qty 2

## 2014-11-26 MED ORDER — BUPROPION HCL ER (XL) 300 MG PO TB24
300.0000 mg | ORAL_TABLET | Freq: Every day | ORAL | Status: DC
  Administered 2014-11-27 – 2014-11-29 (×3): 300 mg via ORAL
  Filled 2014-11-26 (×3): qty 1

## 2014-11-26 MED ORDER — METOPROLOL SUCCINATE ER 50 MG PO TB24: 50.0000 mg | ORAL_TABLET | Freq: Every day | ORAL | Status: DC

## 2014-11-26 MED ORDER — DEXTROSE 5 % IV SOLN
500.0000 mg | Freq: Every day | INTRAVENOUS | Status: DC
  Administered 2014-11-27: 500 mg via INTRAVENOUS
  Administered 2014-11-27: 250 mg via INTRAVENOUS
  Administered 2014-11-28: 500 mg via INTRAVENOUS
  Filled 2014-11-26 (×3): qty 500

## 2014-11-26 MED ORDER — NITROGLYCERIN 0.4 MG SL SUBL
0.4000 mg | SUBLINGUAL_TABLET | Freq: Once | SUBLINGUAL | Status: AC
  Administered 2014-11-27: 0.4 mg via SUBLINGUAL
  Filled 2014-11-26: qty 1

## 2014-11-26 MED ORDER — SODIUM CHLORIDE 0.9 % IJ SOLN
3.0000 mL | Freq: Two times a day (BID) | INTRAMUSCULAR | Status: DC
  Administered 2014-11-28: 3 mL via INTRAVENOUS

## 2014-11-26 NOTE — ED Notes (Signed)
Breathing treatment on going

## 2014-11-26 NOTE — ED Provider Notes (Addendum)
CSN: [id number]     Arrival date & time 11/26/14  1542 History   First MD Initiated Contact with Patient 11/26/14 1608     Chief Complaint  Patient presents with  . Asthma  . Chest Pain  . Shortness of Breath     (Consider location/radiation/quality/duration/timing/severity/associated sxs/prior Treatment) Patient is a 44 y.o. male presenting with asthma, chest pain, and shortness of breath. The history is provided by the patient.  Asthma This is a recurrent problem. Episode onset: 3-4 days ago. The problem occurs constantly. The problem has been gradually worsening. Associated symptoms include chest pain and shortness of breath. The symptoms are aggravated by walking. Nothing relieves the symptoms. Treatments tried: inhalers, rest and oxygen. The treatment provided no relief.  Chest Pain Associated symptoms: cough and shortness of breath   Associated symptoms: no fever, no nausea and not vomiting   Shortness of Breath Associated symptoms: chest pain and cough   Associated symptoms: no fever and no vomiting     Past Medical History  Diagnosis Date  . CHF (congestive heart failure)   . HTN (hypertension)   . Ptosis   . Atrial flutter   . Atrial fibrillation   . Morbid obesity   . DM2 (diabetes mellitus, type 2)   . Depression   . Respiratory failure   . Chronic low back pain   . Asthma   . Vision abnormalities   . Abscess     right thigh   Past Surgical History  Procedure Laterality Date  . Tonsillectomy    . Cholecystectomy    . Tonsillectomy    . Irrigation and debridement abscess  10/13/2011    Procedure: IRRIGATION AND DEBRIDEMENT ABSCESS;  Surgeon: Emelia LoronMatthew Wakefield, MD;  Location: [facility];  Service: General;  Laterality: Right;  Irrigation and debridement right thigh abscess  . Application of wound vac      right thigh abscess  . Breath tek h pylori N/A 10/06/2014    Procedure: BREATH TEK H PYLORI;  Surgeon: Valarie MerinoMatthew B Martin, MD;  [facility];  Service:  General;  Laterality: N/A;   Family History  Problem Relation Age of Onset  . Coronary artery disease Mother     9040s; father had it in 5730s; both deceased in their 6170s    . Cancer Mother     liver  . Heart disease Father   . Cancer Father     prostate   History  Substance Use Topics  . Smoking status: Never Smoker   . Smokeless tobacco: Not on file     Comment: no significant tobacco use   . Alcohol Use: Yes     Comment: no significant use-- rare    Review of Systems  Constitutional: Positive for chills. Negative for fever.  Respiratory: Positive for cough and shortness of breath.        No sputum  Cardiovascular: Positive for chest pain. Negative for leg swelling.  Gastrointestinal: Negative for nausea and vomiting.  All other systems reviewed and are negative.     Allergies  Iodine and Shellfish allergy  Home Medications   Prior to Admission medications   Medication Sig Start Date End Date Taking? Authorizing Provider  albuterol (VENTOLIN HFA) 108 (90 BASE) MCG/ACT inhaler Inhale 2 puffs into the lungs every 6 (six) hours as needed. Shortness of breath 03/30/14  Yes Alison MurrayAlma M Devine, MD  amiodarone (PACERONE) 200 MG tablet Take 200 mg by mouth daily.   Yes Historical Provider, MD  amLODipine (NORVASC) 10 MG tablet Take 10 mg by mouth daily.  11/13/11  Yes Historical Provider, MD  buPROPion (WELLBUTRIN XL) 300 MG 24 hr tablet Take 300 mg by mouth Daily.  03/13/12  Yes Historical Provider, MD  divalproex (DEPAKOTE) 500 MG DR tablet Take 1,000 mg by mouth daily.    Yes Historical Provider, MD  HUMALOG MIX 75/25 (75-25) 100 UNIT/ML SUSP Inject 40-75 Units into the skin Three times a day before meals. Takes 75 units before breakfast Takes 40 units before lunch Takes 75 units before supper 03/13/12  Yes Historical Provider, MD  hydrALAZINE (APRESOLINE) 10 MG tablet Take 1 tablet (10 mg total) by mouth every 8 (eight) hours. Patient taking differently: Take 10 mg by mouth daily.   03/30/14  Yes Alison Murray, MD  lamoTRIgine (LAMICTAL) 25 MG tablet Take 25 mg by mouth daily.    Yes Historical Provider, MD  metoprolol (TOPROL-XL) 50 MG 24 hr tablet Take 50 mg by mouth daily. 05/10/11  Yes Lewayne Bunting, MD  Naftifine HCl (NAFTIN) 2 % CREA Apply 1 application topically daily. Apply to feet.   Yes Historical Provider, MD  rivaroxaban (XARELTO) 20 MG TABS tablet Take 1 tablet (20 mg total) by mouth daily with supper. 06/26/14  Yes Simonne Martinet, NP  cyclobenzaprine (FLEXERIL) 10 MG tablet Take 10 mg by mouth 3 (three) times daily as needed for muscle spasms.    Historical Provider, MD  ibuprofen (ADVIL,MOTRIN) 600 MG tablet Take 1 tablet (600 mg total) by mouth every 6 (six) hours as needed for moderate pain. Patient not taking: Reported on 11/26/2014 06/26/14   Simonne Martinet, NP  nitroGLYCERIN (NITROSTAT) 0.4 MG SL tablet Place 0.4 mg under the tongue once.    Historical Provider, MD  nystatin (MYCOSTATIN/NYSTOP) 100000 UNIT/GM POWD Apply topically to groin three times a day Patient not taking: Reported on 11/26/2014 06/26/14   Simonne Martinet, NP   BP 163/96 mmHg  Pulse 111  Temp(Src) 97.5 F (36.4 C) (Oral)  Resp 19  SpO2 84% Physical Exam  Constitutional: He is oriented to person, place, and time. He appears well-developed and well-nourished. No distress.  Morbidly obese  HENT:  Head: Normocephalic and atraumatic.  Mouth/Throat: Oropharynx is clear and moist. Mucous membranes are dry.  Eyes: Conjunctivae and EOM are normal. Pupils are equal, round, and reactive to light.  Neck: Normal range of motion. Neck supple.  Cardiovascular: Normal rate, regular rhythm and intact distal pulses.   No murmur heard. Pulmonary/Chest: Effort normal. No respiratory distress. He has decreased breath sounds in the right lower field and the left lower field. He has no wheezes. He has no rales.  Minimal scant wheezes in the right upper lobe  Abdominal: Soft. He exhibits no distension.  There is no tenderness. There is no rebound and no guarding.  Musculoskeletal: Normal range of motion. He exhibits no edema or tenderness.  Neurological: He is alert and oriented to person, place, and time.  Skin: Skin is warm and dry. No rash noted. No erythema.  Psychiatric: He has a normal mood and affect. His behavior is normal.  Nursing note and vitals reviewed.   ED Course  Procedures (including critical care time) Labs Review Labs Reviewed  COMPREHENSIVE METABOLIC PANEL - Abnormal; Notable for the following:    Glucose, Bld 254 (*)    GFR calc non Af Amer 90 (*)    All other components within normal limits  BRAIN NATRIURETIC PEPTIDE  CBC  Rosezena Sensor, ED    Imaging Review Dg Chest 2 View  11/26/2014   CLINICAL DATA:  LEFT side chest pain, cough and shortness of breath today, history hypertension, type 2 diabetes, CHF, atrial fibrillation/flutter  EXAM: CHEST  2 VIEW  COMPARISON:  09/22/2014  FINDINGS: Enlargement of cardiac silhouette with pulmonary vascular congestion.  Asymmetric infiltrates RIGHT greater than LEFT favor asymmetric pulmonary edema over infection.  No pleural effusion or pneumothorax.  Respiratory motion artifacts degrade lateral view.  Scattered endplate spur formation thoracic spine.  IMPRESSION: Enlargement of cardiac silhouette with pulmonary vascular congestion and asymmetric pulmonary infiltrates RIGHT greater than LEFT favor pulmonary edema over pneumonia.   Electronically Signed   By: Ulyses Southward M.D.   On: 11/26/2014 16:55     EKG Interpretation   Date/Time:  Thursday November 26 2014 15:48:07 EST Ventricular Rate:  107 PR Interval:    QRS Duration: 87 QT Interval:  341 QTC Calculation: 455 R Axis:   142 Text Interpretation:  new  Atrial fibrillation Ventricular premature  complex Right axis deviation Low voltage, precordial leads Probable  anterolateral infarct, old Confirmed by Anitra Lauth  MD, Alphonzo Lemmings ([id number]) on  11/26/2014 4:18:58 PM       MDM   Final diagnoses:  Chest pain  Asthma exacerbation  Hypoxia  Hypoventilation syndrome    Patient with a history of chronic A. fib on xarelto, asthma on 3 L of oxygen chronically usually only at night who presents today with worsening shortness of breath and bilateral chest tightness. Patient denies any excessive new weight gain or lower extremity swelling. He is taking his medications as prescribed. Using his inhalers at home with minimal relief. Upon arrival here patient's oxygen saturation was 84% which improved with nasal cannula. Patient is morbidly obese and has decreased breath sounds without significant wheezing.  Concern for possible cardiac etiology for his shortness of breath such as CHF versus asthma exacerbation. He denies any infectious symptoms. EKG today shows atrial fibrillation which has not been displayed on prior EKGs however patient states he has had paroxysmal A. fib for some time.  No prior history of PE and lower suspicion for PE at this time.  Patient given albuterol, Atrovent and prednisone. Chest x-ray, CBC, CMP, BNP, troponin pending  7:16 PM Chest x-ray with possible vascular congestion and fluid overload however could be body habitus. Normal troponin, CMP with hyperglycemia that otherwise normal and normal BNP.  Currently patient has mild improvement after albuterol and Atrovent. Feel like this is most likely asthma exacerbation and less likely CHF. Patient given a second albuterol and Atrovent and  Will admit for further care.  Gwyneth Sprout, MD 11/26/14 1918  Gwyneth Sprout, MD 11/26/14 Serena Croissant  Gwyneth Sprout, MD 11/26/14 [phone]

## 2014-11-26 NOTE — H&P (Signed)
[name] is an 44 y.o. male.    [name] (pcp, [facility]) [name] (cardiologist)  Chief Complaint: sob HPI: 44 yo male with htn, CHF (EF 55-60%), Afib (CHADS=3), Asthma c/o dyspnea starting about 2 days ago,  Was taking inhalers with some relief, but today his inhalers and o2 were not working for him and therefore he presented to the ED for evaluation.  Pt admits to subjective fever, and dry cough.  Pt has c/o sscp " sharp " with cough, radiates to the left and right sides.  Been going on for the past 2 days.  Pt denies orthopnea, pnds, lower ext edema, n/v, diarrhea, brbpr, black stool.  CXR ? Edema, but BNP wnl.  Pt will be admitted for asthma exacerbation.   Past Medical History  Diagnosis Date  . CHF (congestive heart failure)     EF55-60%  . HTN (hypertension)   . Ptosis   . Atrial flutter   . Atrial fibrillation   . Morbid obesity   . DM2 (diabetes mellitus, type 2)   . Depression   . Respiratory failure   . Chronic low back pain   . Asthma   . Vision abnormalities   . Abscess     right thigh  . Sleep apnea     Past Surgical History  Procedure Laterality Date  . Tonsillectomy    . Cholecystectomy    . Tonsillectomy    . Irrigation and debridement abscess  10/13/2011    Procedure: IRRIGATION AND DEBRIDEMENT ABSCESS;  Surgeon: Rolm Bookbinder, MD;  Location: [place];  Service: General;  Laterality: Right;  Irrigation and debridement right thigh abscess  . Application of wound vac      right thigh abscess  . Breath tek h pylori N/A 10/06/2014    Procedure: BREATH TEK H PYLORI;  Surgeon: Pedro Earls, MD;  Location: [facility];  Service: General;  Laterality: N/A;    Family History  Problem Relation Age of Onset  . Coronary artery disease Mother     74s; father had it in 88s; both deceased in their 35s    . Cancer Mother     liver  . Heart disease Father   . Cancer Father     prostate   Social History:  reports that he has never  smoked. He does not have any smokeless tobacco history on file. He reports that he drinks about 0.6 oz of alcohol per week. He reports that he does not use illicit drugs.  Allergies:  Allergies  Allergen Reactions  . Iodine Anaphylaxis and Other (See Comments)    Associated with the shellfish allergy  . Shellfish Allergy Anaphylaxis   Medications reviewed  (Not in a hospital admission)  Results for orders placed or performed during the hospital encounter of 11/26/14 (from the past 48 hour(s))  Comprehensive metabolic panel     Status: Abnormal   Collection Time: 11/26/14  4:28 PM  Result Value Ref Range   Sodium 139 135 - 145 mmol/L   Potassium 4.3 3.5 - 5.1 mmol/L   Chloride 106 96 - 112 mmol/L   CO2 28 19 - 32 mmol/L   Glucose, Bld 254 (H) 70 - 99 mg/dL   BUN 13 6 - 23 mg/dL   Creatinine, Ser 1.00 0.50 - 1.35 mg/dL   Calcium 8.7 8.4 - 10.5 mg/dL   Total Protein 7.5 6.0 - 8.3 g/dL   Albumin 3.7 3.5 - 5.2 g/dL   AST 16  0 - 37 U/L   ALT 21 0 - 53 U/L   Alkaline Phosphatase 59 39 - 117 U/L   Total Bilirubin 0.9 0.3 - 1.2 mg/dL   GFR calc non Af Amer 90 (L) >90 mL/min   GFR calc Af Amer >90 >90 mL/min    Comment: (NOTE) The eGFR has been calculated using the CKD EPI equation. This calculation has not been validated in all clinical situations. eGFR's persistently <90 mL/min signify possible Chronic Kidney Disease.    Anion gap 5 5 - 15  Brain natriuretic peptide     Status: None   Collection Time: 11/26/14  4:28 PM  Result Value Ref Range   B Natriuretic Peptide 81.8 0.0 - 100.0 pg/mL  I-stat troponin, ED (not at [facility])     Status: None   Collection Time: 11/26/14  4:40 PM  Result Value Ref Range   Troponin i, poc 0.00 0.00 - 0.08 ng/mL   Comment 3            Comment: Due to the release kinetics of cTnI, a negative result within the first hours of the onset of symptoms does not rule out myocardial infarction with certainty. If myocardial infarction is still  suspected, repeat the test at appropriate intervals.    Dg Chest 2 View  11/26/2014   CLINICAL DATA:  LEFT side chest pain, cough and shortness of breath today, history hypertension, type 2 diabetes, CHF, atrial fibrillation/flutter  EXAM: CHEST  2 VIEW  COMPARISON:  09/22/2014  FINDINGS: Enlargement of cardiac silhouette with pulmonary vascular congestion.  Asymmetric infiltrates RIGHT greater than LEFT favor asymmetric pulmonary edema over infection.  No pleural effusion or pneumothorax.  Respiratory motion artifacts degrade lateral view.  Scattered endplate spur formation thoracic spine.  IMPRESSION: Enlargement of cardiac silhouette with pulmonary vascular congestion and asymmetric pulmonary infiltrates RIGHT greater than LEFT favor pulmonary edema over pneumonia.   Electronically Signed   By: Lavonia Dana M.D.   On: 11/26/2014 16:55    Review of Systems  Constitutional: Positive for fever. Negative for chills, weight loss, malaise/fatigue and diaphoresis.       Subjective  HENT: Negative for congestion, ear discharge, ear pain, hearing loss, nosebleeds, sore throat and tinnitus.   Eyes: Negative for blurred vision, double vision, photophobia, pain, discharge and redness.  Respiratory: Positive for cough, shortness of breath and wheezing. Negative for hemoptysis, sputum production and stridor.   Cardiovascular: Positive for chest pain. Negative for palpitations, orthopnea, claudication, leg swelling and PND.  Gastrointestinal: Negative for heartburn, nausea, vomiting, abdominal pain, diarrhea, constipation, blood in stool and melena.  Genitourinary: Negative for dysuria, urgency, frequency, hematuria and flank pain.  Musculoskeletal: Negative for myalgias, back pain, joint pain, falls and neck pain.  Skin: Negative for itching and rash.  Neurological: Negative for dizziness, tingling, tremors, sensory change, speech change, focal weakness, seizures, loss of consciousness, weakness and  headaches.  Endo/Heme/Allergies: Negative for environmental allergies and polydipsia. Does not bruise/bleed easily.  Psychiatric/Behavioral: Negative for depression, suicidal ideas, hallucinations, memory loss and substance abuse. The patient is not nervous/anxious and does not have insomnia.     Blood pressure 130/86, pulse 40, temperature 97.5 F (36.4 C), temperature source Oral, resp. rate 21, SpO2 92 %. Physical Exam  Constitutional: He is oriented to person, place, and time. He appears well-developed and well-nourished.  HENT:  Head: Normocephalic and atraumatic.  Mouth/Throat: No oropharyngeal exudate.  Eyes: Conjunctivae and EOM are normal. Pupils are equal, round, and reactive to  light. No scleral icterus.  Neck: Normal range of motion. Neck supple. No JVD present. No tracheal deviation present. No thyromegaly present.  Cardiovascular: Exam reveals no gallop and no friction rub.   No murmur heard. Irr, irr, s1, s2  Respiratory: Effort normal. He has wheezes. He has no rales. He exhibits tenderness.  Slight chest tenderness with palpation  GI: Soft. Bowel sounds are normal. He exhibits no distension and no mass. There is no tenderness. There is no rebound and no guarding.  Morbid obeisity  Musculoskeletal: Normal range of motion. He exhibits no edema or tenderness.  Lymphadenopathy:    He has no cervical adenopathy.  Neurological: He is alert and oriented to person, place, and time. He has normal reflexes. He displays normal reflexes. No cranial nerve deficit. He exhibits normal muscle tone. Coordination normal.  Skin: Skin is warm and dry. No rash noted. No erythema. No pallor.  Psychiatric: He has a normal mood and affect. His behavior is normal. Judgment and thought content normal.     Assessment/Plan Hypoxia, Dyspnea likely secondary to asthma exacerbation zithromax iv for bronchitis Solumedrol 80mg  iv q8h Albuterol neb 1 po q6h and q6h prn  Dm2 fsbs ac and qhs,  iss  Bradycardia ? fictious per ED physician No hr of 40  Afib with rvr, Pafib, CHADS2=3 Cont xarelto  OSA , cpap at nite  DVT prophylaxis: on xarelto  Code status: Lowanda Foster 11/26/2014, 8:19 PM

## 2014-11-26 NOTE — ED Notes (Signed)
Pt has been repositioned and is on side of bed and requesting, bariatric bed. Pt was given diet ginger ale and ham sandwitch

## 2014-11-26 NOTE — Progress Notes (Signed)
RT placed patient on CPAP. Patient is on home setting at 16 cmH2O. Sterile water added to water chamber for humidification. Oxygen bleed into tubing. Patient is tolerating well . RT will continue to monitor and assess as needed.

## 2014-11-26 NOTE — ED Notes (Addendum)
Pt c/o SOB and left mid and lateral chest pain starting 3 hours ago. Denies back pain or dizziness. Asthma started worsening starting Monday. Out of inhaler today and has not used inhaler today. Uses CPAP at night and has used it as he is supposed to. Says usually when he has this type of distress, he has pneumonia. Denies radiation. Hx atrialfibrillation, CHF, morbid obesity. Appears slightly short of breath. Ambulatory. No other c/c. Has not had all medications today. Wears 3L [place] O2 at home but only at night.

## 2014-11-27 DIAGNOSIS — R0902 Hypoxemia: Secondary | ICD-10-CM

## 2014-11-27 LAB — COMPREHENSIVE METABOLIC PANEL
ALK PHOS: 59 U/L (ref 39–117)
ALT: 21 U/L (ref 0–53)
AST: 20 U/L (ref 0–37)
Albumin: 3.7 g/dL (ref 3.5–5.2)
Anion gap: 18 — ABNORMAL HIGH (ref 5–15)
BUN: 17 mg/dL (ref 6–23)
CO2: 20 mmol/L (ref 19–32)
Calcium: 9.1 mg/dL (ref 8.4–10.5)
Chloride: 103 mmol/L (ref 96–112)
Creatinine, Ser: 1.04 mg/dL (ref 0.50–1.35)
GFR calc non Af Amer: 86 mL/min — ABNORMAL LOW (ref >90)
Glucose, Bld: 352 mg/dL — ABNORMAL HIGH (ref 70–99)
Potassium: 5.3 mmol/L — ABNORMAL HIGH (ref 3.5–5.1)
Sodium: 141 mmol/L (ref 135–145)
Total Bilirubin: 0.6 mg/dL (ref 0.3–1.2)
Total Protein: 7.7 g/dL (ref 6.0–8.3)

## 2014-11-27 LAB — CBC WITH DIFFERENTIAL/PLATELET
BASOS PCT: 0 % (ref 0–1)
Basophils Absolute: 0 10*3/uL (ref 0.0–0.1)
Eosinophils Absolute: 0 10*3/uL (ref 0.0–0.7)
Eosinophils Relative: 0 % (ref 0–5)
HCT: 41.9 % (ref 39.0–52.0)
HEMOGLOBIN: 13.8 g/dL (ref 13.0–17.0)
LYMPHS ABS: 1 10*3/uL (ref 0.7–4.0)
LYMPHS PCT: 8 % — AB (ref 12–46)
MCH: 27.7 pg (ref 26.0–34.0)
MCHC: 32.9 g/dL (ref 30.0–36.0)
MCV: 84.1 fL (ref 78.0–100.0)
MONO ABS: 0.4 10*3/uL (ref 0.1–1.0)
MONOS PCT: 3 % (ref 3–12)
NEUTROS ABS: 11.7 10*3/uL — AB (ref 1.7–7.7)
Neutrophils Relative %: 89 % — ABNORMAL HIGH (ref 43–77)
Platelets: 199 10*3/uL (ref 150–400)
RBC: 4.98 MIL/uL (ref 4.22–5.81)
RDW: 15.8 % — ABNORMAL HIGH (ref 11.5–15.5)
WBC: 13.2 10*3/uL — ABNORMAL HIGH (ref 4.0–10.5)

## 2014-11-27 LAB — GLUCOSE, CAPILLARY
GLUCOSE-CAPILLARY: 403 mg/dL — AB (ref 70–99)
Glucose-Capillary: 326 mg/dL — ABNORMAL HIGH (ref 70–99)
Glucose-Capillary: 341 mg/dL — ABNORMAL HIGH (ref 70–99)
Glucose-Capillary: 437 mg/dL — ABNORMAL HIGH (ref 70–99)
Glucose-Capillary: 409 mg/dL — ABNORMAL HIGH (ref 70–99)

## 2014-11-27 LAB — TROPONIN I: Troponin I: <0.03 ng/mL (ref <0.031)

## 2014-11-27 LAB — TSH: TSH: 0.603 u[IU]/mL (ref 0.350–4.500)

## 2014-11-27 MED ORDER — CETYLPYRIDINIUM CHLORIDE 0.05 % MT LIQD
7.0000 mL | Freq: Two times a day (BID) | OROMUCOSAL | Status: DC
  Administered 2014-11-27 – 2014-11-28 (×3): 7 mL via OROMUCOSAL

## 2014-11-27 MED ORDER — METOPROLOL SUCCINATE ER 50 MG PO TB24
50.0000 mg | ORAL_TABLET | Freq: Every day | ORAL | Status: DC
  Administered 2014-11-27 – 2014-11-28 (×3): 50 mg via ORAL
  Filled 2014-11-27 (×3): qty 1

## 2014-11-27 MED ORDER — AMIODARONE HCL 200 MG PO TABS
200.0000 mg | ORAL_TABLET | Freq: Every day | ORAL | Status: DC
  Administered 2014-11-27 – 2014-11-28 (×3): 200 mg via ORAL
  Filled 2014-11-27 (×3): qty 1

## 2014-11-27 MED ORDER — ACETAMINOPHEN 325 MG PO TABS
650.0000 mg | ORAL_TABLET | Freq: Four times a day (QID) | ORAL | Status: DC | PRN
  Administered 2014-11-27: 650 mg via ORAL
  Filled 2014-11-27: qty 2

## 2014-11-27 MED ORDER — FUROSEMIDE 10 MG/ML IJ SOLN
40.0000 mg | Freq: Once | INTRAMUSCULAR | Status: AC
  Administered 2014-11-27: 40 mg via INTRAVENOUS
  Filled 2014-11-27: qty 4

## 2014-11-27 MED ORDER — INSULIN ASPART 100 UNIT/ML ~~LOC~~ SOLN
10.0000 [IU] | Freq: Once | SUBCUTANEOUS | Status: AC
  Administered 2014-11-27: 10 [IU] via SUBCUTANEOUS

## 2014-11-27 MED ORDER — INSULIN ASPART 100 UNIT/ML ~~LOC~~ SOLN
7.0000 [IU] | Freq: Once | SUBCUTANEOUS | Status: AC
  Administered 2014-11-28: 7 [IU] via SUBCUTANEOUS

## 2014-11-27 MED ORDER — INSULIN ASPART PROT & ASPART (70-30 MIX) 100 UNIT/ML ~~LOC~~ SUSP
75.0000 [IU] | Freq: Two times a day (BID) | SUBCUTANEOUS | Status: DC
  Administered 2014-11-27 – 2014-11-29 (×4): 75 [IU] via SUBCUTANEOUS
  Filled 2014-11-27: qty 10

## 2014-11-27 MED ORDER — CHLORHEXIDINE GLUCONATE 0.12 % MT SOLN
15.0000 mL | Freq: Two times a day (BID) | OROMUCOSAL | Status: DC
  Administered 2014-11-27 – 2014-11-29 (×5): 15 mL via OROMUCOSAL
  Filled 2014-11-27 (×4): qty 15

## 2014-11-27 MED ORDER — KETOROLAC TROMETHAMINE 30 MG/ML IJ SOLN
30.0000 mg | Freq: Once | INTRAMUSCULAR | Status: AC
  Administered 2014-11-27: 30 mg via INTRAVENOUS
  Filled 2014-11-27: qty 1

## 2014-11-27 NOTE — Progress Notes (Signed)
Shift event: Pt wears CPAP at night. Tonight, was sleeping and doing well on the CPAP until early am when he desatted. RN paged this NP and RN instructed to wake pt and place back on [place] at 3L as he was on before CPAP.  Pt was in no respiratory distress. Then satting in the upper 80s-90% on 3L and increased to 4L. Now satting 93-95% on 4L.  Jimmye NormanKaren Kirby-Graham, NP [facility]

## 2014-11-27 NOTE — Progress Notes (Signed)
RT changed patient to BIPAP due to patients low oxygenation and increased heart rate.RN called Jimmye NormanKaren Kirby-Graham and we put him back on his nasal canula for a short time. BIPAP setting is IPAP 18 and EPAP 8 with 6 liters oxygen bleed into the tubing. Sterile water added to water chamber for humidification. Patient is tolerating well at this time. RT will continue to assess and monitor as needed,

## 2014-11-27 NOTE — Progress Notes (Signed)
Inpatient Diabetes Program Recommendations  AACE/ADA: New Consensus Statement on Inpatient Glycemic Control (2013)  Target Ranges:  Prepandial:   less than 140 mg/dL      Peak postprandial:   less than 180 mg/dL (1-2 hours)      Critically ill patients:  140 - 180 mg/dL   Reason for Visit: Diabetes Consult  Diabetes history: DM2 Outpatient Diabetes medications: 75/25 75 units in am 40 units at lunch and 75 units at dinner Current orders for Inpatient glycemic control: 70/30 75 units bid and Novolog sensitive tidwc and hs  44 year old male admitted with SOB. Hx morbid obesity and DM2. Pt states he is on schedule to have bariatric surgery in May or June 2016. Sees Dr. Lurene ShadowBallen for diabetes management. Checks blood sugars at home 3-4 times/day. Usually runs below 200. To start 70/30 insulin before dinner tonight.  Results for Maurice MarchCLARK, Jarius J (MRN [id number]) as of 11/27/2014 13:17  Ref. Range 11/26/2014 22:14 11/27/2014 02:02 11/27/2014 07:31 11/27/2014 11:45  Glucose-Capillary Latest Range: 70-99 mg/dL 409294 (H) 811326 (H) 914341 (H) 437 (H)  Hyperglycemia with missed 75/25 insulin dose last night and this am. Blood sugars should improve.  Inpatient Diabetes Program Recommendations Insulin - Basal: To start 70/30 75 units bid at supper tonight Correction (SSI): Increase Novolog to resistant tidwc and hs HgbA1C: pending  Note: If late afternoon blood sugars elevated beginning tomorrow, recommend adding 70/30 20 units ac Lunch meal. (1/2 home dose) Titrate as needed. Will continue to follow. Thank you. Ailene Ardshonda [place] Searles, RD, LDN, CDE Inpatient Diabetes Coordinator [phone]

## 2014-11-27 NOTE — Progress Notes (Signed)
[facility]                                                                              Patient Demographics  [name], is a 44 y.o. male, DOB - 1971/05/31, ZJQ:[id number]  Admit date - 11/26/2014   Admitting Physician Pearson Grippe, MD  Outpatient Primary MD for the patient is Emeterio Reeve, MD  LOS - 1   Chief Complaint  Patient presents with  . Asthma  . Chest Pain  . Shortness of Breath      HPI on 11/26/2014 by Dr. Pearson Grippe 44 yo male with htn, CHF (EF 55-60%), Afib (CHADS=3), Asthma c/o dyspnea starting about 2 days ago, Was taking inhalers with some relief, but today his inhalers and o2 were not working for him and therefore he presented to the ED for evaluation. Pt admits to subjective fever, and dry cough. Pt has c/o sscp " sharp " with cough, radiates to the left and right sides. Been going on for the past 2 days. Pt denies orthopnea, pnds, lower ext edema, n/v, diarrhea, brbpr, black stool. CXR ? Edema, but BNP wnl. Pt will be admitted for asthma exacerbation.   Assessment & Plan   Acute hypoxic respiratory failure/asthma exacerbation -Chest x-ray: Enlargement of cardiac silhouette with pulmonary gastric congestion and asymmetric pulmonary infiltrates right greater than left, therapy on her edema over pneumonia -Continue Solu-Medrol, nebulizer treatments, supplemental oxygen, azithromycin -Will repeat chest x-ray  Diabetes mellitus, type II -Continue Novolog 70/30 75u BID, ISS, CBG monitoring -Diabetes coroner will be consulted -Hemoglobin A1c pending  Atrial fibrillation  -Chads2: 3 -Continue Xarelto, amiodarone, metoprolol  Hypertension -Continue amlodipine, hydralazine, metoprolol  Obstructive sleep apnea -Continue CPAP  Chronic CHF, diastolic -Echocardiogram 03/21/2014 shows EF 55-60%, systolic function was normal  -Patient currently euvolemic,  patient was given 1 dose of Lasix overnight  -Continue to monitor daily weights, intake and  output  -BNP 81.8   Morbid obesity -BMI 72 -Patient states he is trying to lose weight so he can have gastric bypass surgery  Code Status: Full   Family Communication: None at bedside  Disposition Plan: Admitted  Time Spent in minutes   30 minutes  Procedures  None  Consults   None  DVT Prophylaxis  Xarelto  Lab Results  Component Value Date   PLT 199 11/27/2014    Medications  Scheduled Meds: . albuterol  2.5 mg Nebulization Q6H  . amiodarone  200 mg Oral QHS  . amLODipine  10 mg Oral Daily  . antiseptic oral rinse  7 mL Mouth Rinse q12n4p  . azithromycin  500 mg Intravenous QHS  . buPROPion  300 mg Oral Daily  . chlorhexidine  15 mL Mouth Rinse BID  . divalproex  1,000 mg Oral Daily  . hydrALAZINE  10 mg Oral Daily  . insulin aspart  0-5 Units Subcutaneous QHS  . insulin aspart  0-9 Units Subcutaneous TID WC  . insulin aspart protamine- aspart  75 Units Subcutaneous BID WC  . lamoTRIgine  25 mg Oral Daily  . methylPREDNISolone (SOLU-MEDROL) injection  80 mg Intravenous 3 times per day  . metoprolol succinate  50 mg Oral QHS  . Naftifine  HCl  1 application Apply externally Daily  . rivaroxaban  20 mg Oral Q supper  . sodium chloride  3 mL Intravenous Q12H  . sodium chloride  3 mL Intravenous Q12H   Continuous Infusions:  PRN Meds:.sodium chloride, albuterol, cyclobenzaprine, sodium chloride  Antibiotics   Anti-infectives    Start     Dose/Rate Route Frequency Ordered Stop   11/26/14 2300  azithromycin (ZITHROMAX) 500 mg in dextrose 5 % 250 mL IVPB     500 mg 250 mL/hr over 60 Minutes Intravenous Daily at bedtime 11/26/14 2205          Subjective:   Su [name] seen and examined today.  Patient states his shortness of breath has improved. He denies any chest pain or abdominal pain at this time. Patient does state that he wants to have gastric bypass.   Objective:   Filed Vitals:   11/27/14 [id number] 11/27/14 0536 11/27/14 0824 11/27/14 0921  BP:   158/96  134/84  Pulse: 90 92  88  Temp:  98.1 F (36.7 C)    TempSrc:  Oral    Resp: 15 20  20   Height:      Weight:  262.451 kg (578 lb 9.6 oz)    SpO2: 94% 94% 95% 95%    Wt Readings from Last 3 Encounters:  11/27/14 262.451 kg (578 lb 9.6 oz)  10/12/14 259.095 kg (571 lb 3.2 oz)  09/08/14 264.765 kg (583 lb 11.2 oz)     Intake/Output Summary (Last 24 hours) at 11/27/14 1415 Last data filed at 11/27/14 1300  Gross per 24 hour  Intake   2377 ml  Output   2050 ml  Net    327 ml    Exam  General: Well developed, NAD  HEENT: NCAT, mucous membranes moist.   Cardiovascular: S1 S2 auscultated, irregular.  Respiratory: Mild expiratory wheezing, diminished breath sounds  Abdomen: Soft, obese, nontender, nondistended, + bowel sounds  Extremities: warm dry without cyanosis clubbing or edema  Neuro: AAOx3, nonfocal  Psych: Normal affect and demeanor with intact judgement and insight  Data Review   Micro Results No results found for this or any previous visit (from the past 240 hour(s)).  Radiology Reports Dg Chest 2 View  11/26/2014   CLINICAL DATA:  LEFT side chest pain, cough and shortness of breath today, history hypertension, type 2 diabetes, CHF, atrial fibrillation/flutter  EXAM: CHEST  2 VIEW  COMPARISON:  09/22/2014  FINDINGS: Enlargement of cardiac silhouette with pulmonary vascular congestion.  Asymmetric infiltrates RIGHT greater than LEFT favor asymmetric pulmonary edema over infection.  No pleural effusion or pneumothorax.  Respiratory motion artifacts degrade lateral view.  Scattered endplate spur formation thoracic spine.  IMPRESSION: Enlargement of cardiac silhouette with pulmonary vascular congestion and asymmetric pulmonary infiltrates RIGHT greater than LEFT favor pulmonary edema over pneumonia.   Electronically Signed   By: Ulyses SouthwardMark  Boles M.D.   On: 11/26/2014 16:55    CBC  Recent Labs Lab 11/26/14 1628 11/27/14 0537  WBC 11.8* 13.2*  HGB 13.6 13.8    HCT 41.8 41.9  PLT 195 199  MCV 84.6 84.1  MCH 27.5 27.7  MCHC 32.5 32.9  RDW 15.8* 15.8*  LYMPHSABS  --  1.0  MONOABS  --  0.4  EOSABS  --  0.0  BASOSABS  --  0.0    Chemistries   Recent Labs Lab 11/26/14 1628 11/27/14 0537  NA 139 141  K 4.3 5.3*  CL 106 103  CO2 28  20  GLUCOSE 254* 352*  BUN 13 17  CREATININE 1.00 1.04  CALCIUM 8.7 9.1  AST 16 20  ALT 21 21  ALKPHOS 59 59  BILITOT 0.9 0.6   ------------------------------------------------------------------------------------------------------------------ estimated creatinine clearance is 199.6 mL/min (by C-G formula based on Cr of 1.04). ------------------------------------------------------------------------------------------------------------------ No results for input(s): HGBA1C in the last 72 hours. ------------------------------------------------------------------------------------------------------------------ No results for input(s): CHOL, HDL, LDLCALC, TRIG, CHOLHDL, LDLDIRECT in the last 72 hours. ------------------------------------------------------------------------------------------------------------------  Recent Labs  11/27/14 0537  TSH 0.603   ------------------------------------------------------------------------------------------------------------------ No results for input(s): VITAMINB12, FOLATE, FERRITIN, TIBC, IRON, RETICCTPCT in the last 72 hours.  Coagulation profile No results for input(s): INR, PROTIME in the last 168 hours.  No results for input(s): DDIMER in the last 72 hours.  Cardiac Enzymes  Recent Labs Lab 11/26/14 2206 11/27/14 0537 11/27/14 0930  TROPONINI <0.03 <0.03 <0.03   ------------------------------------------------------------------------------------------------------------------ Invalid input(s): POCBNP    Zacaria Pousson D.O. on 11/27/2014 at 2:15 PM  Between 7am to 7pm - Pager - [phone]  After 7pm go to [email or web address] - password [email or web address]  And  look for the night coverage person covering for me after hours  [facility] Office  [phone]

## 2014-11-28 ENCOUNTER — Inpatient Hospital Stay (HOSPITAL_COMMUNITY): Payer: Medicare Other

## 2014-11-28 DIAGNOSIS — I5032 Chronic diastolic (congestive) heart failure: Secondary | ICD-10-CM

## 2014-11-28 LAB — BASIC METABOLIC PANEL
Anion gap: 8 (ref 5–15)
BUN: 31 mg/dL — AB (ref 6–23)
CO2: 27 mmol/L (ref 19–32)
Calcium: 8.7 mg/dL (ref 8.4–10.5)
Chloride: 100 mmol/L (ref 96–112)
Creatinine, Ser: 1.15 mg/dL (ref 0.50–1.35)
GFR calc Af Amer: 88 mL/min — ABNORMAL LOW (ref >90)
GFR calc non Af Amer: 76 mL/min — ABNORMAL LOW (ref >90)
Glucose, Bld: 364 mg/dL — ABNORMAL HIGH (ref 70–99)
POTASSIUM: 5 mmol/L (ref 3.5–5.1)
Sodium: 135 mmol/L (ref 135–145)

## 2014-11-28 LAB — CBC
HCT: 41.4 % (ref 39.0–52.0)
Hemoglobin: 13.6 g/dL (ref 13.0–17.0)
MCH: 27.5 pg (ref 26.0–34.0)
MCHC: 32.9 g/dL (ref 30.0–36.0)
MCV: 83.8 fL (ref 78.0–100.0)
PLATELETS: 239 10*3/uL (ref 150–400)
RBC: 4.94 MIL/uL (ref 4.22–5.81)
RDW: 15.6 % — ABNORMAL HIGH (ref 11.5–15.5)
WBC: 20.6 10*3/uL — AB (ref 4.0–10.5)

## 2014-11-28 LAB — GLUCOSE, CAPILLARY
GLUCOSE-CAPILLARY: 308 mg/dL — AB (ref 70–99)
GLUCOSE-CAPILLARY: 465 mg/dL — AB (ref 70–99)
Glucose-Capillary: 327 mg/dL — ABNORMAL HIGH (ref 70–99)
Glucose-Capillary: 452 mg/dL — ABNORMAL HIGH (ref 70–99)

## 2014-11-28 LAB — HEMOGLOBIN A1C
HEMOGLOBIN A1C: 8.1 % — AB (ref 4.8–5.6)
MEAN PLASMA GLUCOSE: 186 mg/dL

## 2014-11-28 MED ORDER — INSULIN ASPART 100 UNIT/ML ~~LOC~~ SOLN
10.0000 [IU] | Freq: Once | SUBCUTANEOUS | Status: AC
  Administered 2014-11-28: 10 [IU] via SUBCUTANEOUS

## 2014-11-28 MED ORDER — METHYLPREDNISOLONE SODIUM SUCC 125 MG IJ SOLR
60.0000 mg | Freq: Two times a day (BID) | INTRAMUSCULAR | Status: DC
  Administered 2014-11-28: 60 mg via INTRAVENOUS
  Filled 2014-11-28 (×2): qty 2

## 2014-11-28 MED ORDER — INSULIN ASPART 100 UNIT/ML ~~LOC~~ SOLN
7.0000 [IU] | Freq: Once | SUBCUTANEOUS | Status: AC
  Administered 2014-11-28: 7 [IU] via SUBCUTANEOUS

## 2014-11-28 NOTE — Progress Notes (Signed)
I agree with the previous nurse's assessment 

## 2014-11-28 NOTE — Progress Notes (Addendum)
[facility]                                                                              Patient Demographics  [name], is a 44 y.o. male, DOB - 26-Jul-1971, ZOX:[id number]  Admit date - 11/26/2014   Admitting Physician Pearson Grippe, MD  Outpatient Primary MD for the patient is Emeterio Reeve, MD  LOS - 2   Chief Complaint  Patient presents with  . Asthma  . Chest Pain  . Shortness of Breath      HPI on 11/26/2014 by Dr. Pearson Grippe 44 yo male with htn, CHF (EF 55-60%), Afib (CHADS=3), Asthma c/o dyspnea starting about 2 days ago, Was taking inhalers with some relief, but today his inhalers and o2 were not working for him and therefore he presented to the ED for evaluation. Pt admits to subjective fever, and dry cough. Pt has c/o sscp " sharp " with cough, radiates to the left and right sides. Been going on for the past 2 days. Pt denies orthopnea, pnds, lower ext edema, n/v, diarrhea, brbpr, black stool. CXR ? Edema, but BNP wnl. Pt will be admitted for asthma exacerbation.   Assessment & Plan   Acute on chronic hypoxic respiratory failure/asthma exacerbation -Breathing has improved -Chest x-ray: Enlargement of cardiac silhouette with pulmonary gastric congestion and asymmetric pulmonary infiltrates right greater than left, therapy on her edema over pneumonia -Continue Solu-Medrol, nebulizer treatments, supplemental oxygen, azithromycin -Repeat CXR: no change from previous exam- mild vascular congestion and pulm edema -Patient uses 3L oxygen at home   Diabetes mellitus, type II -Continue Novolog 70/30 75u BID, ISS, CBG monitoring -Diabetes coordinator consulted -Hemoglobin A1c 8.1  Atrial fibrillation  -Chads2: 3 -Continue Xarelto, amiodarone, metoprolol  Hypertension -Continue amlodipine, hydralazine, metoprolol  Obstructive sleep apnea -Continue CPAP  Chronic CHF, diastolic -Echocardiogram 03/21/2014 shows EF 55-60%, systolic function was normal    -Patient appears euvolemic, however, CXR mentions mild congestion and pulm edema.   -Patient was given one dose of lasix upon admission -Continue to monitor daily weights, intake and output -good urine output over the past 24hours:  -BNP 81.8   Morbid obesity -BMI 72 -Patient states he is trying to lose weight so he can have gastric bypass surgery  Leukocytosis -Likely secondary to steroids -Will continue to monitor CBC  Code Status: Full   Family Communication: None at bedside  Disposition Plan: Admitted.  Likely discharge 2/27.  Time Spent in minutes   30 minutes  Procedures  None  Consults   None  DVT Prophylaxis  Xarelto  Lab Results  Component Value Date   PLT 239 11/28/2014    Medications  Scheduled Meds: . albuterol  2.5 mg Nebulization Q6H  . amiodarone  200 mg Oral QHS  . amLODipine  10 mg Oral Daily  . antiseptic oral rinse  7 mL Mouth Rinse q12n4p  . azithromycin  500 mg Intravenous QHS  . buPROPion  300 mg Oral Daily  . chlorhexidine  15 mL Mouth Rinse BID  . divalproex  1,000 mg Oral Daily  . hydrALAZINE  10 mg Oral Daily  . insulin aspart  0-5 Units Subcutaneous QHS  . insulin  aspart  0-9 Units Subcutaneous TID WC  . insulin aspart protamine- aspart  75 Units Subcutaneous BID WC  . lamoTRIgine  25 mg Oral Daily  . methylPREDNISolone (SOLU-MEDROL) injection  60 mg Intravenous Q12H  . metoprolol succinate  50 mg Oral QHS  . Naftifine HCl  1 application Apply externally Daily  . rivaroxaban  20 mg Oral Q supper  . sodium chloride  3 mL Intravenous Q12H  . sodium chloride  3 mL Intravenous Q12H   Continuous Infusions:  PRN Meds:.sodium chloride, acetaminophen, albuterol, cyclobenzaprine, sodium chloride  Antibiotics   Anti-infectives    Start     Dose/Rate Route Frequency Ordered Stop   11/26/14 2300  azithromycin (ZITHROMAX) 500 mg in dextrose 5 % 250 mL IVPB     500 mg 250 mL/hr over 60 Minutes Intravenous Daily at bedtime  11/26/14 2205          Subjective:   Su [name] seen and examined today.  Patient states his shortness of breath has improved. However he feels that he has continued wheezing.  He denies any chest pain or abdominal pain at this time. .   Objective:   Filed Vitals:   11/27/14 2122 11/28/14 0143 11/28/14 0622 11/28/14 0942  BP: 133/75  153/92   Pulse: 94  83   Temp: 98 F (36.7 C)  97.6 F (36.4 C)   TempSrc: Oral  Oral   Resp: 22  20   Height:      Weight:      SpO2: 94% 93% 96% 92%    Wt Readings from Last 3 Encounters:  11/27/14 262.451 kg (578 lb 9.6 oz)  10/12/14 259.095 kg (571 lb 3.2 oz)  09/08/14 264.765 kg (583 lb 11.2 oz)     Intake/Output Summary (Last 24 hours) at 11/28/14 1132 Last data filed at 11/28/14 0900  Gross per 24 hour  Intake   1560 ml  Output   7925 ml  Net  -6365 ml    Exam  General: Well developed, NAD  Cardiovascular: S1 S2 auscultated, irregular.  Respiratory: Mild expiratory wheezing-improved.  Good air movement  Abdomen: Soft, obese, nontender, nondistended, + bowel sounds  Psych: Normal affect and demeanor with intact judgement and insight  Data Review   Micro Results No results found for this or any previous visit (from the past 240 hour(s)).  Radiology Reports Dg Chest 2 View  11/28/2014   CLINICAL DATA:  Cough and shortness of breath for 3 days  EXAM: CHEST  2 VIEW  COMPARISON:  11/26/2014  FINDINGS: Cardiac shadow is mildly enlarged. Mild vascular congestion and pulmonary edema is again identified. The overall appearance is stable from the prior study. No new focal abnormality is seen.  IMPRESSION: No change from the previous exam.   Electronically Signed   By: Alcide CleverMark  Lukens M.D.   On: 11/28/2014 10:41   Dg Chest 2 View  11/26/2014   CLINICAL DATA:  LEFT side chest pain, cough and shortness of breath today, history hypertension, type 2 diabetes, CHF, atrial fibrillation/flutter  EXAM: CHEST  2 VIEW  COMPARISON:  09/22/2014   FINDINGS: Enlargement of cardiac silhouette with pulmonary vascular congestion.  Asymmetric infiltrates RIGHT greater than LEFT favor asymmetric pulmonary edema over infection.  No pleural effusion or pneumothorax.  Respiratory motion artifacts degrade lateral view.  Scattered endplate spur formation thoracic spine.  IMPRESSION: Enlargement of cardiac silhouette with pulmonary vascular congestion and asymmetric pulmonary infiltrates RIGHT greater than LEFT favor pulmonary edema over pneumonia.  Electronically Signed   By: Ulyses Southward M.D.   On: 11/26/2014 16:55    CBC  Recent Labs Lab 11/26/14 1628 11/27/14 0537 11/28/14 0610  WBC 11.8* 13.2* 20.6*  HGB 13.6 13.8 13.6  HCT 41.8 41.9 41.4  PLT 195 199 239  MCV 84.6 84.1 83.8  MCH 27.5 27.7 27.5  MCHC 32.5 32.9 32.9  RDW 15.8* 15.8* 15.6*  LYMPHSABS  --  1.0  --   MONOABS  --  0.4  --   EOSABS  --  0.0  --   BASOSABS  --  0.0  --     Chemistries   Recent Labs Lab 11/26/14 1628 11/27/14 0537 11/28/14 0610  NA 139 141 135  K 4.3 5.3* 5.0  CL 106 103 100  CO2 GLUCOSE 254* 352* 364*  BUN 13 17 31*  CREATININE 1.00 1.04 1.15  CALCIUM 8.7 9.1 8.7  AST 16 20  --   ALT 21 21  --   ALKPHOS 59 59  --   BILITOT 0.9 0.6  --    ------------------------------------------------------------------------------------------------------------------ estimated creatinine clearance is 180.5 mL/min (by C-G formula based on Cr of 1.15). ------------------------------------------------------------------------------------------------------------------  Recent Labs  11/26/14 2230  HGBA1C 8.1*   ------------------------------------------------------------------------------------------------------------------ No results for input(s): CHOL, HDL, LDLCALC, TRIG, CHOLHDL, LDLDIRECT in the last 72 hours. ------------------------------------------------------------------------------------------------------------------  Recent Labs   11/27/14 0537  TSH 0.603   ------------------------------------------------------------------------------------------------------------------ No results for input(s): VITAMINB12, FOLATE, FERRITIN, TIBC, IRON, RETICCTPCT in the last 72 hours.  Coagulation profile No results for input(s): INR, PROTIME in the last 168 hours.  No results for input(s): DDIMER in the last 72 hours.  Cardiac Enzymes  Recent Labs Lab 11/26/14 2206 11/27/14 0537 11/27/14 0930  TROPONINI <0.03 <0.03 <0.03   ------------------------------------------------------------------------------------------------------------------ Invalid input(s): POCBNP    Maclovia Uher D.O. on 11/28/2014 at 11:32 AM  Between 7am to 7pm - Pager - [phone]  After 7pm go to [email or web address] - password [email or web address]  And look for the night coverage person covering for me after hours  [facility] Office  [phone]

## 2014-11-28 NOTE — Progress Notes (Signed)
RT placed patient on CPAP. Patient setting is auto 10-14 cmH2O. Sterile water was added to water chamber for humidification. Patient is tolerating and sleeping well. RT will continue to assess and monitor as needed.

## 2014-11-29 LAB — BASIC METABOLIC PANEL
Anion gap: 9 (ref 5–15)
BUN: 35 mg/dL — ABNORMAL HIGH (ref 6–23)
CHLORIDE: 94 mmol/L — AB (ref 96–112)
CO2: 30 mmol/L (ref 19–32)
Calcium: 8.7 mg/dL (ref 8.4–10.5)
Creatinine, Ser: 1.32 mg/dL (ref 0.50–1.35)
GFR calc Af Amer: 74 mL/min — ABNORMAL LOW (ref >90)
GFR, EST NON AFRICAN AMERICAN: 64 mL/min — AB (ref >90)
Glucose, Bld: 349 mg/dL — ABNORMAL HIGH (ref 70–99)
POTASSIUM: 5 mmol/L (ref 3.5–5.1)
Sodium: 133 mmol/L — ABNORMAL LOW (ref 135–145)

## 2014-11-29 LAB — GLUCOSE, CAPILLARY
Glucose-Capillary: 344 mg/dL — ABNORMAL HIGH (ref 70–99)
Glucose-Capillary: 295 mg/dL — ABNORMAL HIGH (ref 70–99)

## 2014-11-29 LAB — CBC
HEMATOCRIT: 43.3 % (ref 39.0–52.0)
HEMOGLOBIN: 14 g/dL (ref 13.0–17.0)
MCH: 27.2 pg (ref 26.0–34.0)
MCHC: 32.3 g/dL (ref 30.0–36.0)
MCV: 84.2 fL (ref 78.0–100.0)
PLATELETS: 248 10*3/uL (ref 150–400)
RBC: 5.14 MIL/uL (ref 4.22–5.81)
RDW: 15.5 % (ref 11.5–15.5)
WBC: 18.1 10*3/uL — AB (ref 4.0–10.5)

## 2014-11-29 MED ORDER — PREDNISONE (PAK) 10 MG PO TABS: ORAL_TABLET | Freq: Every day | ORAL | Status: DC

## 2014-11-29 MED ORDER — INSULIN ASPART 100 UNIT/ML ~~LOC~~ SOLN: 0.0000 [IU] | Freq: Every day | SUBCUTANEOUS | Status: DC

## 2014-11-29 MED ORDER — AZITHROMYCIN 500 MG PO TABS: 500.0000 mg | ORAL_TABLET | Freq: Every day | ORAL | Status: DC

## 2014-11-29 MED ORDER — INSULIN ASPART 100 UNIT/ML ~~LOC~~ SOLN
0.0000 [IU] | Freq: Three times a day (TID) | SUBCUTANEOUS | Status: DC
  Administered 2014-11-29: 15 [IU] via SUBCUTANEOUS
  Administered 2014-11-29: 11 [IU] via SUBCUTANEOUS

## 2014-11-29 MED ORDER — PREDNISONE 20 MG PO TABS
40.0000 mg | ORAL_TABLET | Freq: Every day | ORAL | Status: DC
  Administered 2014-11-29: 40 mg via ORAL
  Filled 2014-11-29: qty 2

## 2014-11-29 MED ORDER — INSULIN ASPART 100 UNIT/ML ~~LOC~~ SOLN: SUBCUTANEOUS | Status: DC

## 2014-11-29 MED ORDER — INSULIN ASPART PROT & ASPART (70-30 MIX) 100 UNIT/ML ~~LOC~~ SUSP
40.0000 [IU] | Freq: Every day | SUBCUTANEOUS | Status: DC
  Administered 2014-11-29: 40 [IU] via SUBCUTANEOUS

## 2014-11-29 NOTE — Discharge Instructions (Signed)
Asthma Attack Prevention Although there is no way to prevent asthma from starting, you can take steps to control the disease and reduce its symptoms. Learn about your asthma and how to control it. Take an active role to control your asthma by working with your health care provider to create and follow an asthma action plan. An asthma action plan guides you in:  Taking your medicines properly.  Avoiding things that set off your asthma or make your asthma worse (asthma triggers).  Tracking your level of asthma control.  Responding to worsening asthma.  Seeking emergency care when needed. To track your asthma, keep records of your symptoms, check your peak flow number using a handheld device that shows how well air moves out of your lungs (peak flow meter), and get regular asthma checkups.  WHAT ARE SOME WAYS TO PREVENT AN ASTHMA ATTACK?  Take medicines as directed by your health care provider.  Keep track of your asthma symptoms and level of control.  With your health care provider, write a detailed plan for taking medicines and managing an asthma attack. Then be sure to follow your action plan. Asthma is an ongoing condition that needs regular monitoring and treatment.  Identify and avoid asthma triggers. Many outdoor allergens and irritants (such as pollen, mold, cold air, and air pollution) can trigger asthma attacks. Find out what your asthma triggers are and take steps to avoid them.  Monitor your breathing. Learn to recognize warning signs of an attack, such as coughing, wheezing, or shortness of breath. Your lung function may decrease before you notice any signs or symptoms, so regularly measure and record your peak airflow with a home peak flow meter.  Identify and treat attacks early. If you act quickly, you are less likely to have a severe attack. You will also need less medicine to control your symptoms. When your peak flow measurements decrease and alert you to an upcoming attack,  take your medicine as instructed and immediately stop any activity that may have triggered the attack. If your symptoms do not improve, get medical help.  Pay attention to increasing quick-relief inhaler use. If you find yourself relying on your quick-relief inhaler, your asthma is not under control. See your health care provider about adjusting your treatment. WHAT CAN MAKE MY SYMPTOMS WORSE? A number of common things can set off or make your asthma symptoms worse and cause temporary increased inflammation of your airways. Keep track of your asthma symptoms for several weeks, detailing all the environmental and emotional factors that are linked with your asthma. When you have an asthma attack, go back to your asthma diary to see which factor, or combination of factors, might have contributed to it. Once you know what these factors are, you can take steps to control many of them. If you have allergies and asthma, it is important to take asthma prevention steps at home. Minimizing contact with the substance to which you are allergic will help prevent an asthma attack. Some triggers and ways to avoid these triggers are: Animal Dander:  Some people are allergic to the flakes of skin or dried saliva from animals with fur or feathers.   There is no such thing as a hypoallergenic dog or cat breed. All dogs or cats can cause allergies, even if they don't shed.  Keep these pets out of your home.  If you are not able to keep a pet outdoors, keep the pet out of your bedroom and other sleeping areas at all  times, and keep the door closed. °· Remove carpets and furniture covered with cloth from your home. If that is not possible, keep the pet away from fabric-covered furniture and carpets. °Dust Mites: °Many people with asthma are allergic to dust mites. Dust mites are tiny bugs that are found in every home in mattresses, pillows, carpets, fabric-covered furniture, bedcovers, clothes, stuffed toys, and other  fabric-covered items.  °· Cover your mattress in a special dust-proof cover. °· Cover your pillow in a special dust-proof cover, or wash the pillow each week in hot water. Water must be hotter than 130° F (54.4° C) to kill dust mites. Cold or warm water used with detergent and bleach can also be effective. °· Wash the sheets and blankets on your bed each week in hot water. °· Try not to sleep or lie on cloth-covered cushions. °· Call ahead when traveling and ask for a smoke-free hotel room. Bring your own bedding and pillows in case the hotel only supplies feather pillows and down comforters, which may contain dust mites and cause asthma symptoms. °· Remove carpets from your bedroom and those laid on concrete, if you can. °· Keep stuffed toys out of the bed, or wash the toys weekly in hot water or cooler water with detergent and bleach. °Cockroaches: °Many people with asthma are allergic to the droppings and remains of cockroaches.  °· Keep food and garbage in closed containers. Never leave food out. °· Use poison baits, traps, powders, gels, or paste (for example, boric acid). °· If a spray is used to kill cockroaches, stay out of the room until the odor goes away. °Indoor Mold: °· Fix leaky faucets, pipes, or other sources of water that have mold around them. °· Clean floors and moldy surfaces with a fungicide or diluted bleach. °· Avoid using humidifiers, vaporizers, or swamp coolers. These can spread molds through the air. °Pollen and Outdoor Mold: °· When pollen or mold spore counts are high, try to keep your windows closed. °· Stay indoors with windows closed from late morning to afternoon. Pollen and some mold spore counts are highest at that time. °· Ask your health care provider whether you need to take anti-inflammatory medicine or increase your dose of the medicine before your allergy season starts. °Other Irritants to Avoid: °· Tobacco smoke is an irritant. If you smoke, ask your health care provider how  you can quit. Ask family members to quit smoking, too. Do not allow smoking in your home or car. °· If possible, do not use a wood-burning stove, kerosene heater, or fireplace. Minimize exposure to all sources of smoke, including incense, candles, fires, and fireworks. °· Try to stay away from strong odors and sprays, such as perfume, talcum powder, hair spray, and paints. °· Decrease humidity in your home and use an indoor air cleaning device. Reduce indoor humidity to below 60%. Dehumidifiers or central air conditioners can do this. °· Decrease house dust exposure by changing furnace and air cooler filters frequently. °· Try to have someone else vacuum for you once or twice a week. Stay out of rooms while they are being vacuumed and for a short while afterward. °· If you vacuum, use a dust mask from a hardware store, a double-layered or microfilter vacuum cleaner bag, or a vacuum cleaner with a HEPA filter. °· Sulfites in foods and beverages can be irritants. Do not drink beer or wine or eat dried fruit, processed potatoes, or shrimp if they cause asthma symptoms. °· Cold   air can trigger an asthma attack. Cover your nose and mouth with a scarf on cold or windy days.  Several health conditions can make asthma more difficult to manage, including a runny nose, sinus infections, reflux disease, psychological stress, and sleep apnea. Work with your health care provider to manage these conditions.  Avoid close contact with people who have a respiratory infection such as a cold or the flu, since your asthma symptoms may get worse if you catch the infection. Wash your hands thoroughly after touching items that may have been handled by people with a respiratory infection.  Get a flu shot every year to protect against the flu virus, which often makes asthma worse for days or weeks. Also get a pneumonia shot if you have not previously had one. Unlike the flu shot, the pneumonia shot does not need to be given  yearly. Medicines:  Talk to your health care provider about whether it is safe for you to take aspirin or non-steroidal anti-inflammatory medicines (NSAIDs). In a small number of people with asthma, aspirin and NSAIDs can cause asthma attacks. These medicines must be avoided by people who have known aspirin-sensitive asthma. It is important that people with aspirin-sensitive asthma read labels of all over-the-counter medicines used to treat pain, colds, coughs, and fever.  Beta-blockers and ACE inhibitors are other medicines you should discuss with your health care provider. HOW CAN I FIND OUT WHAT I AM ALLERGIC TO? Ask your asthma health care provider about allergy skin testing or blood testing (the RAST test) to identify the allergens to which you are sensitive. If you are found to have allergies, the most important thing to do is to try to avoid exposure to any allergens that you are sensitive to as much as possible. Other treatments for allergies, such as medicines and allergy shots (immunotherapy) are available.  CAN I EXERCISE? Follow your health care provider's advice regarding asthma treatment before exercising. It is important to maintain a regular exercise program, but vigorous exercise or exercise in cold, humid, or dry environments can cause asthma attacks, especially for those people who have exercise-induced asthma. Document Released: 09/06/2009 Document Revised: 09/23/2013 Document Reviewed: 03/26/2013 [facility] Patient Information 2015 [place], [place]. This information is not intended to replace advice given to you by your health care provider. Make sure you discuss any questions you have with your health care provider. Diabetes Mellitus and Food It is important for you to manage your blood sugar (glucose) level. Your blood glucose level can be greatly affected by what you eat. Eating healthier foods in the appropriate amounts throughout the day at about the same time each day will help  you control your blood glucose level. It can also help slow or prevent worsening of your diabetes mellitus. Healthy eating may even help you improve the level of your blood pressure and reach or maintain a healthy weight.  HOW CAN FOOD AFFECT ME? Carbohydrates Carbohydrates affect your blood glucose level more than any other type of food. Your dietitian will help you determine how many carbohydrates to eat at each meal and teach you how to count carbohydrates. Counting carbohydrates is important to keep your blood glucose at a healthy level, especially if you are using insulin or taking certain medicines for diabetes mellitus. Alcohol Alcohol can cause sudden decreases in blood glucose (hypoglycemia), especially if you use insulin or take certain medicines for diabetes mellitus. Hypoglycemia can be a life-threatening condition. Symptoms of hypoglycemia (sleepiness, dizziness, and disorientation) are similar to symptoms  of having too much alcohol.  If your health care provider has given you approval to drink alcohol, do so in moderation and use the following guidelines:  Women should not have more than one drink per day, and men should not have more than two drinks per day. One drink is equal to:  12 oz of beer.  5 oz of wine.  1 oz of hard liquor.  Do not drink on an empty stomach.  Keep yourself hydrated. Have water, diet soda, or unsweetened iced tea.  Regular soda, juice, and other mixers might contain a lot of carbohydrates and should be counted. WHAT FOODS ARE NOT RECOMMENDED? As you make food choices, it is important to remember that all foods are not the same. Some foods have fewer nutrients per serving than other foods, even though they might have the same number of calories or carbohydrates. It is difficult to get your body what it needs when you eat foods with fewer nutrients. Examples of foods that you should avoid that are high in calories and carbohydrates but low in nutrients  include:  Trans fats (most processed foods list trans fats on the Nutrition Facts label).  Regular soda.  Juice.  Candy.  Sweets, such as cake, pie, doughnuts, and cookies.  Fried foods. WHAT FOODS CAN I EAT? Have nutrient-rich foods, which will nourish your body and keep you healthy. The food you should eat also will depend on several factors, including:  The calories you need.  The medicines you take.  Your weight.  Your blood glucose level.  Your blood pressure level.  Your cholesterol level. You also should eat a variety of foods, including:  Protein, such as meat, poultry, fish, tofu, nuts, and seeds (lean animal proteins are best).  Fruits.  Vegetables.  Dairy products, such as milk, cheese, and yogurt (low fat is best).  Breads, grains, pasta, cereal, rice, and beans.  Fats such as olive oil, trans fat-free margarine, canola oil, avocado, and olives. DOES EVERYONE WITH DIABETES MELLITUS HAVE THE SAME MEAL PLAN? Because every person with diabetes mellitus is different, there is not one meal plan that works for everyone. It is very important that you meet with a dietitian who will help you create a meal plan that is just right for you. Document Released: 06/15/2005 Document Revised: 09/23/2013 Document Reviewed: 08/15/2013 [facility] Patient Information 2015 [place]. This information is not intended to replace advice given to you by your health care provider. Make sure you discuss any questions you have with your health care provider.     Work Note:   To whom it concern:   Mr. [name]  was admitted to the Hospital from 11/26/2014 to 11/29/2014 and was discharged on 11/29/2014. Mr. [name] should be excused from work starting2/25/2016 and may return to work without any restrictions on 11/30/2014.  Call [facility] office [phone] for any questions.  Sincerely, Edsel PetrinMaryann Jaymie Misch, DO [facility] 11/29/2014, 9:23 AM

## 2014-11-29 NOTE — Discharge Summary (Signed)
Physician Discharge Summary  [name] AVW:[id number] DOB: [birth year] DOA: 11/26/2014  PCP: Emeterio Reeve, MD  Admit date: 11/26/2014 Discharge date: 11/29/2014  Time spent: 45 minutes  Recommendations for Outpatient Follow-up:  Patient will be discharged home. He is to follow-up with his primary care physician and endocrinologist within 1-2 weeks of discharge. Patient to continue a heart healthy/carb modified diet. Patient should continue his medications as prescribed. Patient was counseled against emotional and stress eating. Patient may resume activity as tolerated.  Discharge Diagnoses:  Acute on chronic hypoxic respiratory failure/asthma exacerbation Uncontrolled diabetes mellitus, type II Atrial fibrillation Hypertension Obstructive sleep apnea Chronic diastolic CHF Morbid obesity Leukocytosis  Discharge Condition: Stable  Diet recommendation: Heart healthy/Carb modified  Filed Weights   11/27/14 0536 11/28/14 0622 11/29/14 0500  Weight: 262.451 kg (578 lb 9.6 oz) 264.13 kg (582 lb 4.8 oz) 265.4 kg (585 lb 1.6 oz)    History of present illness:  on 11/26/2014 by Dr. Pearson Grippe 44 yo male with htn, CHF (EF 55-60%), Afib (CHADS=3), Asthma c/o dyspnea starting about 2 days ago, Was taking inhalers with some relief, but today his inhalers and o2 were not working for him and therefore he presented to the ED for evaluation. Pt admits to subjective fever, and dry cough. Pt has c/o sscp " sharp " with cough, radiates to the left and right sides. Been going on for the past 2 days. Pt denies orthopnea, pnds, lower ext edema, n/v, diarrhea, brbpr, black stool. CXR ? Edema, but BNP wnl. Pt will be admitted for asthma exacerbation.   Hospital Course:  Acute on chronic hypoxic respiratory failure/asthma exacerbation -Breathing has improved -Chest x-ray: Enlargement of cardiac silhouette with pulmonary gastric congestion and asymmetric pulmonary infiltrates right greater than  left, therapy on her edema over pneumonia -Initially placed on Solu-Medrol, nebulizer treatments, supplemental oxygen, azithromycin -Repeat CXR: no change from previous exam- mild vascular congestion and pulm edema -Patient uses 3L oxygen at home - which he should continue to use -Will discharge patient with duration of azithromycin, prednisone taper  Uncontrolled Diabetes mellitus, type II -Was placed on Novolog 70/30 75u BID, ISS, CBG monitoring during hospitalization -Diabetes coordinator consulted -Hemoglobin A1c 8.1 -Patient continuously has stress/emotional eating and has been eating foods that are brought in by friends or family members -Hyperglycemia also complicated by steroids -Had long discussion with patient regarding his DM management.  He will need to follow-up with his PCP or endocrinologist.  -Patient also admitted to not taking his medications as he should be. -Patient will be discharged with sliding scale coverage while he is on prednisone.  Atrial fibrillation  -Chads2: 3 -Continue Xarelto, amiodarone, metoprolol  Hypertension -Continue amlodipine, hydralazine, metoprolol  Obstructive sleep apnea -Continue CPAP  Chronic CHF, diastolic -Echocardiogram 03/21/2014 shows EF 55-60%, systolic function was normal  -Patient appears euvolemic, however, CXR mentions mild congestion and pulm edema.  -Patient was given one dose of lasix upon admission -Continue to monitor daily weights, intake and output -good urine output over the past 24hours:>5L -BNP 81.8  -Patient complains of LE edema, however, this is likely secondary to not keeping his legs elevated, obesity, OSA  Morbid obesity -BMI 72 -Patient states he is trying to lose weight so he can have gastric bypass surgery  Leukocytosis -Likely secondary to steroids  Procedures  None  Consults  None  Discharge Exam: Filed Vitals:   11/29/14 0500  BP: 145/77  Pulse: 59  Temp: 97.7 F (36.5 C)    Resp: 20  Exam  General: Well developed, NAD  Cardiovascular: S1 S2 auscultated, irregular.  Respiratory: Clear to ausculation, no wheezing noted  Abdomen: Soft, obese, nontender, nondistended, + bowel sounds  Psych: Appropriate  Discharge Instructions      Discharge Instructions    Discharge instructions    Complete by:  As directed   Patient will be discharged home. He is to follow-up with his primary care physician and endocrinologist within 1-2 weeks of discharge. Patient to continue a heart healthy/carb modified diet. Patient should continue his medications as prescribed. Patient was counseled against emotional and stress eating. Patient may resume activity as tolerated.            Medication List    STOP taking these medications        nystatin 100000 UNIT/GM Powd      TAKE these medications        albuterol 108 (90 BASE) MCG/ACT inhaler  Commonly known as:  VENTOLIN HFA  Inhale 2 puffs into the lungs every 6 (six) hours as needed. Shortness of breath     amiodarone 200 MG tablet  Commonly known as:  PACERONE  Take 200 mg by mouth daily.     amLODipine 10 MG tablet  Commonly known as:  NORVASC  Take 10 mg by mouth daily.     azithromycin 500 MG tablet  Commonly known as:  ZITHROMAX  Take 1 tablet (500 mg total) by mouth daily.     buPROPion 300 MG 24 hr tablet  Commonly known as:  WELLBUTRIN XL  Take 300 mg by mouth Daily.     cyclobenzaprine 10 MG tablet  Commonly known as:  FLEXERIL  Take 10 mg by mouth 3 (three) times daily as needed for muscle spasms.     divalproex 500 MG DR tablet  Commonly known as:  DEPAKOTE  Take 1,000 mg by mouth daily.     HUMALOG MIX 75/25 (75-25) 100 UNIT/ML Susp injection  Generic drug:  insulin lispro protamine-lispro  Inject 40-75 Units into the skin Three times a day before meals. Takes 75 units before breakfast Takes 40 units before lunch Takes 75 units before supper     hydrALAZINE 10 MG tablet  Commonly  known as:  APRESOLINE  Take 1 tablet (10 mg total) by mouth every 8 (eight) hours.     ibuprofen 600 MG tablet  Commonly known as:  ADVIL,MOTRIN  Take 1 tablet (600 mg total) by mouth every 6 (six) hours as needed for moderate pain.     insulin aspart 100 UNIT/ML injection  Commonly known as:  novoLOG  - Sliding scale    - CBG 70 - 120: 0 units:   - CBG 121 - 150: 2 units;   - CBG 151 - 200: 3 units;   - CBG 201 - 250: 5 units;   - CBG 251 - 300: 8 units;  - CBG 301 - 350: 11 units;  - CBG 351 - 400: 15 units;   -   - Use while on steroids/prednisone.     lamoTRIgine 25 MG tablet  Commonly known as:  LAMICTAL  Take 25 mg by mouth daily.     metoprolol succinate 50 MG 24 hr tablet  Commonly known as:  TOPROL-XL  Take 50 mg by mouth daily.     NAFTIN 2 % Crea  Generic drug:  Naftifine HCl  Apply 1 application topically daily. Apply to feet.     nitroGLYCERIN 0.4 MG SL tablet  Commonly  known as:  NITROSTAT  Place 0.4 mg under the tongue once.     predniSONE 10 MG tablet  Commonly known as:  STERAPRED UNI-PAK  Take by mouth daily. Prednisone dosing: Take  Prednisone  (4 tabs) x 3 days, then taper to  (3 tabs) x 3 days, then  (2 tabs) x 3days, then  (1 tab) x 3days, then STOP.     rivaroxaban 20 MG Tabs tablet  Commonly known as:  XARELTO  Take 1 tablet (20 mg total) by mouth daily with supper.       Allergies  Allergen Reactions  . Iodine Anaphylaxis and Other (See Comments)    Associated with the shellfish allergy  . Shellfish Allergy Anaphylaxis   Follow-up Information    Follow up with Emeterio Reeve, MD. Schedule an appointment as soon as possible for a visit in 1 week.   Specialty:  Family Medicine   Why:  Hospital follow-up   Contact information:   [address] [place] [address] [phone]       Follow up with Dorisann Frames, MD. Schedule an appointment as soon as possible for a visit in 1 week.    Specialty:  Endocrinology   Why:  Diabetes management   Contact information:   [address] [place] [address] [phone]        The results of significant diagnostics from this hospitalization (including imaging, microbiology, ancillary and laboratory) are listed below for reference.    Significant Diagnostic Studies: Dg Chest 2 View  11/28/2014   CLINICAL DATA:  Cough and shortness of breath for 3 days  EXAM: CHEST  2 VIEW  COMPARISON:  11/26/2014  FINDINGS: Cardiac shadow is mildly enlarged. Mild vascular congestion and pulmonary edema is again identified. The overall appearance is stable from the prior study. No new focal abnormality is seen.  IMPRESSION: No change from the previous exam.   Electronically Signed   By: Alcide Clever M.D.   On: 11/28/2014 10:41   Dg Chest 2 View  11/26/2014   CLINICAL DATA:  LEFT side chest pain, cough and shortness of breath today, history hypertension, type 2 diabetes, CHF, atrial fibrillation/flutter  EXAM: CHEST  2 VIEW  COMPARISON:  09/22/2014  FINDINGS: Enlargement of cardiac silhouette with pulmonary vascular congestion.  Asymmetric infiltrates RIGHT greater than LEFT favor asymmetric pulmonary edema over infection.  No pleural effusion or pneumothorax.  Respiratory motion artifacts degrade lateral view.  Scattered endplate spur formation thoracic spine.  IMPRESSION: Enlargement of cardiac silhouette with pulmonary vascular congestion and asymmetric pulmonary infiltrates RIGHT greater than LEFT favor pulmonary edema over pneumonia.   Electronically Signed   By: Ulyses Southward M.D.   On: 11/26/2014 16:55    Microbiology: No results found for this or any previous visit (from the past 240 hour(s)).   Labs: Basic Metabolic Panel:  Recent Labs Lab 11/26/14 1628 11/27/14 0537 11/28/14 0610 11/29/14 0529  NA 139 141 135 133*  K 4.3 5.3* 5.0 5.0  CL 106 103 100 94*  CO2 GLUCOSE 254* 352* 364* 349*  BUN 13 17 31*  35*  CREATININE 1.00 1.04 1.15 1.32  CALCIUM 8.7 9.1 8.7 8.7   Liver Function Tests:  Recent Labs Lab 11/26/14 1628 11/27/14 0537  AST 16 20  ALT 21 21  ALKPHOS 59 59  BILITOT 0.9 0.6  PROT 7.5 7.7  ALBUMIN 3.7 3.7   No results for input(s): LIPASE, AMYLASE in the  last 168 hours. No results for input(s): AMMONIA in the last 168 hours. CBC:  Recent Labs Lab 11/26/14 1628 11/27/14 0537 11/28/14 0610 11/29/14 0529  WBC 11.8* 13.2* 20.6* 18.1*  NEUTROABS  --  11.7*  --   --   HGB 13.6 13.8 13.6 14.0  HCT 41.8 41.9 41.4 43.3  MCV 84.6 84.1 83.8 84.2  PLT 195 199 239 248   Cardiac Enzymes:  Recent Labs Lab 11/26/14 2206 11/27/14 0537 11/27/14 0930  TROPONINI <0.03 <0.03 <0.03   BNP: BNP (last 3 results)  Recent Labs  11/26/14 1628  BNP 81.8    ProBNP (last 3 results)  Recent Labs  03/25/14 2110  PROBNP 33.8    CBG:  Recent Labs Lab 11/28/14 0754 11/28/14 1218 11/28/14 1638 11/28/14 2207 11/29/14 0744  GLUCAP 327* 308* 465* 452* 344*       Signed:  Edsel PetrinMIKHAIL, Aanika Defoor  [facility] 11/29/2014, 9:24 AM

## 2014-11-29 NOTE — Progress Notes (Signed)
CARE MANAGEMENT NOTE 11/29/2014  Patient:  [name],[name]   Account Number:  [id number]  Date Initiated:  11/27/2014  Documentation initiated by:  Trinna BalloonMcGIBBONEY,COOKIE MYRAETTE  Subjective/Objective Assessment:   pt admitted with SOB, Asthma     Action/Plan:   from home   Anticipated DC Date:  11/30/2014   Anticipated DC Plan:  HOME/SELF CARE      DC Planning Services  CM consult      Choice offered to / List presented to:             Status of service:  Completed, signed off Medicare Important Message given?  YES (If response is "NO", the following Medicare IM given date fields will be blank) Date Medicare IM given:  11/29/2014 Medicare IM given by:  [facility],Takyla Kuchera Date Additional Medicare IM given:   Additional Medicare IM given by:    Discharge Disposition:  HOME/SELF CARE  Per UR Regulation:  Reviewed for med. necessity/level of care/duration of stay  If discussed at Long Length of Stay Meetings, dates discussed:    Comments:  11/29/2014 1150 NCM spoke to pt and explained no coverage for scale. He can purchase from [employer], or at medical supply store. [facility]   Isidoro DonningAlesia Dewayne Severe RN  CCM Case Mgmt phone [phone]

## 2014-11-29 NOTE — Progress Notes (Signed)
Placed patient on CPAP auto mode 10-16cmH20 with 2L 02 bled in. Patient is tolerating well at this time.

## 2014-12-10 ENCOUNTER — Encounter: Payer: Medicare Other | Attending: Surgery | Admitting: Dietician

## 2014-12-10 DIAGNOSIS — Z6841 Body Mass Index (BMI) 40.0 and over, adult: Secondary | ICD-10-CM | POA: Diagnosis not present

## 2014-12-10 DIAGNOSIS — E119 Type 2 diabetes mellitus without complications: Secondary | ICD-10-CM | POA: Diagnosis not present

## 2014-12-10 DIAGNOSIS — Z713 Dietary counseling and surveillance: Secondary | ICD-10-CM | POA: Diagnosis not present

## 2014-12-10 NOTE — Progress Notes (Signed)
Appt start time: 1200 end time:  1215.  Supervised Weight Loss:  Primary concerns today: [name] is here for his 2nd SWL visit out of 6 in preparation for gastric sleeve. He has gained several pounds. He was hospitalized recently and is on prednisone and retaining a lot of fluid. However, he is doing great in school (social work degree) and is also very active in [employer] and with [employer]. Still mentally trying to prepare for the post surgery diet.  Weight: 599  Goals: Keep blood sugars under control, limit sodium, and work on having 3 small meals per day  MEDICATIONS: see list  Recent physical activity: walking  Estimated energy needs: 2000-2200 calories  Progress Towards Goal(s):  In progress.   Nutritional Diagnosis:  [place]-3.3 Overweight/obesity related to past poor dietary habits and physical inactivity as evidenced by patient in supervised weight loss for pending bariatric surgery following dietary guidelines for continued weight loss.     Intervention:  Nutrition counseling provided.  Plan: continue being active and continue working on pre op goals   Monitoring/Evaluation:  Dietary intake, exercise, and body weight in 4 week(s).

## 2014-12-10 NOTE — Patient Instructions (Signed)
Keep blood sugars under control Limit sodium Work on having 3 small meals per day

## 2014-12-11 ENCOUNTER — Ambulatory Visit: Payer: Medicare Other | Admitting: Dietician

## 2014-12-16 ENCOUNTER — Emergency Department (HOSPITAL_COMMUNITY): Payer: Medicare Other

## 2014-12-16 ENCOUNTER — Inpatient Hospital Stay (HOSPITAL_COMMUNITY)
Admission: EM | Admit: 2014-12-16 | Discharge: 2014-12-23 | DRG: 501 | Disposition: A | Discharge status: Skilled Nursing Facility | Payer: Medicare Other | Attending: Internal Medicine | Admitting: Internal Medicine

## 2014-12-16 ENCOUNTER — Encounter (HOSPITAL_COMMUNITY): Payer: Self-pay | Admitting: Emergency Medicine

## 2014-12-16 DIAGNOSIS — M25569 Pain in unspecified knee: Secondary | ICD-10-CM | POA: Diagnosis not present

## 2014-12-16 DIAGNOSIS — I5032 Chronic diastolic (congestive) heart failure: Secondary | ICD-10-CM | POA: Diagnosis present

## 2014-12-16 DIAGNOSIS — F329 Major depressive disorder, single episode, unspecified: Secondary | ICD-10-CM | POA: Diagnosis present

## 2014-12-16 DIAGNOSIS — G8929 Other chronic pain: Secondary | ICD-10-CM | POA: Diagnosis present

## 2014-12-16 DIAGNOSIS — E875 Hyperkalemia: Secondary | ICD-10-CM | POA: Diagnosis present

## 2014-12-16 DIAGNOSIS — Z794 Long term (current) use of insulin: Secondary | ICD-10-CM

## 2014-12-16 DIAGNOSIS — I4892 Unspecified atrial flutter: Secondary | ICD-10-CM | POA: Diagnosis present

## 2014-12-16 DIAGNOSIS — Q682 Congenital deformity of knee: Secondary | ICD-10-CM

## 2014-12-16 DIAGNOSIS — E162 Hypoglycemia, unspecified: Secondary | ICD-10-CM | POA: Diagnosis not present

## 2014-12-16 DIAGNOSIS — W19XXXA Unspecified fall, initial encounter: Secondary | ICD-10-CM

## 2014-12-16 DIAGNOSIS — Z7951 Long term (current) use of inhaled steroids: Secondary | ICD-10-CM

## 2014-12-16 DIAGNOSIS — S76112A Strain of left quadriceps muscle, fascia and tendon, initial encounter: Secondary | ICD-10-CM | POA: Diagnosis not present

## 2014-12-16 DIAGNOSIS — Z6841 Body Mass Index (BMI) 40.0 and over, adult: Secondary | ICD-10-CM

## 2014-12-16 DIAGNOSIS — I480 Paroxysmal atrial fibrillation: Secondary | ICD-10-CM | POA: Diagnosis not present

## 2014-12-16 DIAGNOSIS — E11649 Type 2 diabetes mellitus with hypoglycemia without coma: Secondary | ICD-10-CM | POA: Diagnosis present

## 2014-12-16 DIAGNOSIS — I10 Essential (primary) hypertension: Secondary | ICD-10-CM | POA: Diagnosis present

## 2014-12-16 DIAGNOSIS — I4891 Unspecified atrial fibrillation: Secondary | ICD-10-CM | POA: Diagnosis present

## 2014-12-16 DIAGNOSIS — M25562 Pain in left knee: Secondary | ICD-10-CM | POA: Diagnosis not present

## 2014-12-16 DIAGNOSIS — I5033 Acute on chronic diastolic (congestive) heart failure: Secondary | ICD-10-CM

## 2014-12-16 DIAGNOSIS — IMO0002 Reserved for concepts with insufficient information to code with codable children: Secondary | ICD-10-CM | POA: Insufficient documentation

## 2014-12-16 DIAGNOSIS — E118 Type 2 diabetes mellitus with unspecified complications: Secondary | ICD-10-CM

## 2014-12-16 DIAGNOSIS — S86819A Strain of other muscle(s) and tendon(s) at lower leg level, unspecified leg, initial encounter: Secondary | ICD-10-CM | POA: Diagnosis present

## 2014-12-16 DIAGNOSIS — J45909 Unspecified asthma, uncomplicated: Secondary | ICD-10-CM | POA: Diagnosis present

## 2014-12-16 DIAGNOSIS — Z79899 Other long term (current) drug therapy: Secondary | ICD-10-CM

## 2014-12-16 DIAGNOSIS — Z9981 Dependence on supplemental oxygen: Secondary | ICD-10-CM

## 2014-12-16 DIAGNOSIS — J9611 Chronic respiratory failure with hypoxia: Secondary | ICD-10-CM | POA: Diagnosis present

## 2014-12-16 DIAGNOSIS — K219 Gastro-esophageal reflux disease without esophagitis: Secondary | ICD-10-CM | POA: Diagnosis present

## 2014-12-16 DIAGNOSIS — M545 Low back pain: Secondary | ICD-10-CM | POA: Diagnosis present

## 2014-12-16 DIAGNOSIS — G4733 Obstructive sleep apnea (adult) (pediatric): Secondary | ICD-10-CM | POA: Diagnosis present

## 2014-12-16 DIAGNOSIS — W010XXA Fall on same level from slipping, tripping and stumbling without subsequent striking against object, initial encounter: Secondary | ICD-10-CM | POA: Diagnosis present

## 2014-12-16 DIAGNOSIS — E1165 Type 2 diabetes mellitus with hyperglycemia: Secondary | ICD-10-CM

## 2014-12-16 DIAGNOSIS — S86811S Strain of other muscle(s) and tendon(s) at lower leg level, right leg, sequela: Secondary | ICD-10-CM

## 2014-12-16 DIAGNOSIS — Z7952 Long term (current) use of systemic steroids: Secondary | ICD-10-CM

## 2014-12-16 DIAGNOSIS — E662 Morbid (severe) obesity with alveolar hypoventilation: Secondary | ICD-10-CM | POA: Diagnosis present

## 2014-12-16 HISTORY — DX: Chronic respiratory failure, unspecified whether with hypoxia or hypercapnia: J96.10

## 2014-12-16 HISTORY — DX: Morbid (severe) obesity with alveolar hypoventilation: E66.2

## 2014-12-16 LAB — CBC
HCT: 41.2 % (ref 39.0–52.0)
Hemoglobin: 13.5 g/dL (ref 13.0–17.0)
MCH: 27.4 pg (ref 26.0–34.0)
MCHC: 32.8 g/dL (ref 30.0–36.0)
MCV: 83.7 fL (ref 78.0–100.0)
Platelets: 207 10*3/uL (ref 150–400)
RBC: 4.92 MIL/uL (ref 4.22–5.81)
RDW: 16.6 % — ABNORMAL HIGH (ref 11.5–15.5)
WBC: 13.5 10*3/uL — ABNORMAL HIGH (ref 4.0–10.5)

## 2014-12-16 LAB — I-STAT CHEM 8, ED
BUN: 15 mg/dL (ref 6–23)
CHLORIDE: 108 mmol/L (ref 96–112)
Calcium, Ion: 1.11 mmol/L — ABNORMAL LOW (ref 1.12–1.23)
Creatinine, Ser: 1.1 mg/dL (ref 0.50–1.35)
Glucose, Bld: 138 mg/dL — ABNORMAL HIGH (ref 70–99)
HCT: 44 % (ref 39.0–52.0)
Hemoglobin: 15 g/dL (ref 13.0–17.0)
Potassium: 4 mmol/L (ref 3.5–5.1)
Sodium: 144 mmol/L (ref 135–145)
TCO2: 22 mmol/L (ref 0–100)

## 2014-12-16 LAB — CBG MONITORING, ED
GLUCOSE-CAPILLARY: 89 mg/dL (ref 70–99)
Glucose-Capillary: 36 mg/dL — CL (ref 70–99)

## 2014-12-16 MED ORDER — HYDROCODONE-ACETAMINOPHEN 5-325 MG PO TABS: ORAL_TABLET | ORAL | Status: DC

## 2014-12-16 MED ORDER — HYDROCODONE-ACETAMINOPHEN 5-325 MG PO TABS
1.0000 | ORAL_TABLET | Freq: Once | ORAL | Status: AC
  Administered 2014-12-16: 1 via ORAL
  Filled 2014-12-16: qty 1

## 2014-12-16 NOTE — ED Notes (Signed)
Pt expressing worry about how he's going to move around

## 2014-12-16 NOTE — H&P (Signed)
PCP: Emeterio Reeve, MD  Cardiology [facility] Pulmonology [facility] [place]   Chief Complaint:  Fall left knee pain  HPI: [name] is a 44 y.o. male   has a past medical history of CHF (congestive heart failure); HTN (hypertension); Ptosis; Atrial flutter; Atrial fibrillation; Morbid obesity; DM2 (diabetes mellitus, type 2); Depression; Chronic low back pain; Asthma; Vision abnormalities; Abscess; Sleep apnea; Chronic respiratory failure; and Obesity hypoventilation syndrome.   Presented with  Patient today felt like his BG was falling down he went to get some soda and fell on the steps hitting his Left knee.  Patient reports that he was recently on prednisone for his asthma and he was on NOvolog sliding scale now that his prednisone has finished he started to have lower blood sugars. Per EMS BG was down to 3 today. In ER palin films of the knee and hip were unremarkable but patient unable to bear weight.   Hospitalist was called for admission for pain mangement, and hypoglycemia  Review of Systems:    Pertinent positives include:  Cold sweats,   Constitutional:  No weight loss, night sweats, Fevers, chills, fatigue, weight loss  HEENT:  No headaches, Difficulty swallowing,Tooth/dental problems,Sore throat,  No sneezing, itching, ear ache, nasal congestion, post nasal drip,  Cardio-vascular:  No chest pain, Orthopnea, PND, anasarca, dizziness, palpitations.no Bilateral lower extremity swelling  GI:  No heartburn, indigestion, abdominal pain, nausea, vomiting, diarrhea, change in bowel habits, loss of appetite, melena, blood in stool, hematemesis Resp:  no shortness of breath at rest. No dyspnea on exertion, No excess mucus, no productive cough, No non-productive cough, No coughing up of blood.No change in color of mucus.No wheezing. Skin:  no rash or lesions. No jaundice GU:  no dysuria, change in color of urine, no urgency or frequency. No  straining to urinate.  No flank pain.  Musculoskeletal:  No joint pain or no joint swelling. No decreased range of motion. No back pain.  Psych:  No change in mood or affect. No depression or anxiety. No memory loss.  Neuro: no localizing neurological complaints, no tingling, no weakness, no double vision, no gait abnormality, no slurred speech, no confusion  Otherwise ROS are negative except for above, 10 systems were reviewed  Past Medical History: Past Medical History  Diagnosis Date  . CHF (congestive heart failure)     EF55-60%  . HTN (hypertension)   . Ptosis   . Atrial flutter   . Atrial fibrillation   . Morbid obesity   . DM2 (diabetes mellitus, type 2)   . Depression   . Chronic low back pain   . Asthma   . Vision abnormalities   . Abscess     right thigh  . Sleep apnea   . Chronic respiratory failure   . Obesity hypoventilation syndrome    Past Surgical History  Procedure Laterality Date  . Tonsillectomy    . Cholecystectomy    . Tonsillectomy    . Irrigation and debridement abscess  10/13/2011    Procedure: IRRIGATION AND DEBRIDEMENT ABSCESS;  Surgeon: Emelia Loron, MD;  Location: [facility] OR;  Service: General;  Laterality: Right;  Irrigation and debridement right thigh abscess  . Application of wound vac      right thigh abscess  . Breath tek h pylori N/A 10/06/2014    Procedure: BREATH TEK H PYLORI;  Surgeon: Valarie Merino, MD;  Location: [facility];  Service: General;  Laterality: N/A;  Medications: Prior to Admission medications   Medication Sig Start Date End Date Taking? Authorizing Provider  albuterol (VENTOLIN HFA) 108 (90 BASE) MCG/ACT inhaler Inhale 2 puffs into the lungs every 6 (six) hours as needed. Shortness of breath 03/30/14  Yes Alison Murray, MD  ALPRAZolam Prudy Feeler) 0.5 MG tablet Take 1 tablet by mouth daily as needed for anxiety.  11/29/14  Yes Historical Provider, MD  amiodarone (PACERONE) 200 MG tablet Take 200 mg by mouth daily.    Yes Historical Provider, MD  amLODipine (NORVASC) 10 MG tablet Take 10 mg by mouth daily.  11/13/11  Yes Historical Provider, MD  buPROPion (WELLBUTRIN XL) 300 MG 24 hr tablet Take 300 mg by mouth Daily.  03/13/12  Yes Historical Provider, MD  cyclobenzaprine (FLEXERIL) 10 MG tablet Take 10 mg by mouth 3 (three) times daily as needed for muscle spasms.   Yes Historical Provider, MD  DILT-XR 240 MG 24 hr capsule Take 1 capsule by mouth daily. 10/14/14  Yes Historical Provider, MD  divalproex (DEPAKOTE) 500 MG DR tablet Take 1,000 mg by mouth daily.    Yes Historical Provider, MD  HUMALOG MIX 75/25 (75-25) 100 UNIT/ML SUSP Inject 40-75 Units into the skin Three times a day before meals. Takes 75 units before breakfast Takes 40 units before lunch Takes 75 units before supper 03/13/12  Yes Historical Provider, MD  hydrALAZINE (APRESOLINE) 10 MG tablet Take 1 tablet (10 mg total) by mouth every 8 (eight) hours. Patient taking differently: Take 10 mg by mouth daily.  03/30/14  Yes Alison Murray, MD  insulin aspart (NOVOLOG) 100 UNIT/ML injection Sliding scale   CBG 70 - 120: 0 units:  CBG 121 - 150: 2 units;  CBG 151 - 200: 3 units;  CBG 201 - 250: 5 units;  CBG 251 - 300: 8 units; CBG 301 - 350: 11 units; CBG 351 - 400: 15 units;   Use while on steroids/prednisone. 11/29/14  Yes Maryann Mikhail, DO  lamoTRIgine (LAMICTAL) 25 MG tablet Take 25 mg by mouth daily.    Yes Historical Provider, MD  metoprolol (TOPROL-XL) 50 MG 24 hr tablet Take 50 mg by mouth daily. 05/10/11  Yes Lewayne Bunting, MD  Naftifine HCl (NAFTIN) 2 % CREA Apply 1 application topically daily. Apply to feet.   Yes Historical Provider, MD  nitroGLYCERIN (NITROSTAT) 0.4 MG SL tablet Place 0.4 mg under the tongue once.   Yes Historical Provider, MD  rivaroxaban (XARELTO) 20 MG TABS tablet Take 1 tablet (20 mg total) by mouth daily with supper. 06/26/14  Yes Simonne Martinet, NP  azithromycin (ZITHROMAX) 500 MG tablet Take 1 tablet (500 mg  total) by mouth daily. 11/29/14   Maryann Mikhail, DO  HYDROcodone-acetaminophen (NORCO/VICODIN) 5-325 MG per tablet 1 tab PO BID prn pain 12/16/14   Samuel Jester, DO  ibuprofen (ADVIL,MOTRIN) 600 MG tablet Take 1 tablet (600 mg total) by mouth every 6 (six) hours as needed for moderate pain. Patient not taking: Reported on 11/26/2014 06/26/14   Simonne Martinet, NP  predniSONE (STERAPRED UNI-PAK) 10 MG tablet Take by mouth daily. Prednisone dosing: Take  Prednisone  (4 tabs) x 3 days, then taper to  (3 tabs) x 3 days, then  (2 tabs) x 3days, then  (1 tab) x 3days, then STOP. 11/29/14   Nita Sells Mikhail, DO    Allergies:   Allergies  Allergen Reactions  . Iodine Anaphylaxis and Other (See Comments)    Associated with the shellfish allergy  .  Shellfish Allergy Anaphylaxis    Social History:  Ambulatory   independently   Lives at home  With family  Charlene BrookeByron Gladden cousin MPOA [phone]     reports that he has never smoked. He does not have any smokeless tobacco history on file. He reports that he drinks about 0.6 oz of alcohol per week. He reports that he does not use illicit drugs.    Family History: family history includes Cancer in his father and mother; Coronary artery disease in his mother; Heart disease in his father.    Physical Exam: Patient Vitals for the past 24 hrs:  BP Temp Temp src Pulse Resp SpO2 Weight  12/16/14 2246 147/90 mmHg 98 F (36.7 C) Oral 100 18 93 % -  12/16/14 1921 149/76 mmHg 97.9 F (36.6 C) Oral 101 18 92 % -  12/16/14 1639 136/72 mmHg 97.9 F (36.6 C) Oral 91 18 94 % -  12/16/14 1630 - - - - - - (!) 260.818 kg (575 lb)  12/16/14 1621 - - - - - 93 % -    1. General:  in No Acute distress 2. Psychological: Alert and  Oriented 3. Head/ENT:   Moist  Mucous Membranes                          Head Non traumatic, neck supple                          Normal   Dentition 4. SKIN: normal   Skin turgor,  Skin clean Dry and intact no  rash 5. Heart: Regular rate and rhythm no Murmur, Rub or gallop 6. Lungs: Clear to auscultation bilaterally, no wheezes or crackles   7. Abdomen: Soft, non-tender, Non distended 8. Lower extremities: no clubbing, cyanosis, or edema 9. Neurologically Grossly intact, moving all 4 extremities equally 10. MSK: Normal range of motion  body mass index is 71.87 kg/(m^2).   Labs on Admission:   Results for orders placed or performed during the hospital encounter of 12/16/14 (from the past 24 hour(s))  CBG monitoring, ED     Status: Abnormal   Collection Time: 12/16/14  5:44 PM  Result Value Ref Range   Glucose-Capillary 36 (LL) 70 - 99 mg/dL   Comment 1 Document in Chart   CBG monitoring, ED     Status: None   Collection Time: 12/16/14  7:19 PM  Result Value Ref Range   Glucose-Capillary 89 70 - 99 mg/dL  CBC     Status: Abnormal   Collection Time: 12/16/14  9:42 PM  Result Value Ref Range   WBC 13.5 (H) 4.0 - 10.5 K/uL   RBC 4.92 4.22 - 5.81 MIL/uL   Hemoglobin 13.5 13.0 - 17.0 g/dL   HCT 29.541.2 62.139.0 - 30.852.0 %   MCV 83.7 78.0 - 100.0 fL   MCH 27.4 26.0 - 34.0 pg   MCHC 32.8 30.0 - 36.0 g/dL   RDW 65.716.6 (H) 84.611.5 - 96.215.5 %   Platelets 207 150 - 400 K/uL  I-Stat Chem 8, ED     Status: Abnormal   Collection Time: 12/16/14  9:59 PM  Result Value Ref Range   Sodium 144 135 - 145 mmol/L   Potassium 4.0 3.5 - 5.1 mmol/L   Chloride 108 96 - 112 mmol/L   BUN 15 6 - 23 mg/dL   Creatinine, Ser 9.521.10 0.50 - 1.35 mg/dL  Glucose, Bld 138 (H) 70 - 99 mg/dL   Calcium, Ion 1.61 (L) 1.12 - 1.23 mmol/L   TCO2 22 0 - 100 mmol/L   Hemoglobin 15.0 13.0 - 17.0 g/dL   HCT 09.6 04.5 - 40.9 %    UA not obtained  Lab Results  Component Value Date   HGBA1C 8.1* 11/26/2014    Estimated Creatinine Clearance: 187.9 mL/min (by C-G formula based on Cr of 1.1).  BNP (last 3 results)  Recent Labs  03/25/14 2110  PROBNP 33.8    Other results:  I have pearsonaly reviewed this: ECG not obtained     Filed Weights   12/16/14 1630  Weight: 260.818 kg (575 lb)     Cultures:    Component Value Date/Time   SDES BLOOD LEFT ANTECUBITAL 06/17/2014 2126   SPECREQUEST BOTTLES DRAWN AEROBIC ONLY 10CC 06/17/2014 2126   CULT  06/17/2014 2126    NO GROWTH 5 DAYS Performed at [facility]   REPTSTATUS 06/24/2014 FINAL 06/17/2014 2126     Radiological Exams on Admission: Dg Tibia/fibula Left  12/16/2014   CLINICAL DATA:  Fall down steps with knee and lower leg pain, initial encounter  EXAM: LEFT TIBIA AND FIBULA - 2 VIEW  COMPARISON:  None.  FINDINGS: There is no evidence of fracture or other focal bone lesions. Soft tissues are unremarkable.  IMPRESSION: No acute abnormality is noted.   Electronically Signed   By: Alcide Clever M.D.   On: 12/16/2014 19:27   Dg Femur Min 2 Views Left  12/16/2014   CLINICAL DATA:  Recent fall down stairs with leg pain  EXAM: LEFT FEMUR 2 VIEWS  COMPARISON:  None.  FINDINGS: There is no evidence of fracture or other focal bone lesions. Soft tissues are unremarkable.  IMPRESSION: No acute abnormality is noted.   Electronically Signed   By: Alcide Clever M.D.   On: 12/16/2014 19:45    Chart has been reviewed  Assessment/Plan  44 yo male with history of asthma, atrial fibrillation on chronic anticoagulation, morbid obesity last weight 570 pounds presents after a fall resulting in left knee pain. Recent episode of hypoglycemia after he has stopped his prednisone but his insulin has been still at a high level  Present on Admission:  . Left knee pain - pain management, will benefit from orthopedics consult in the morning. Unable to obtain further imaging due to morbid obesity  . A-fib - continue amiodarone, metoprolol and Cardizem continue anticoagulation  . Asthma continue albuterol as needed  . OSA (obstructive sleep apnea) C Pap machine as per home parameters  . Hypoglycemia - decreased dose of insulin continue to monitor closely   Prophylaxis:    Lovenox, Protonix  CODE STATUS:  FULL CODE    Other plan as per orders.  I have spent a total of 55 min on this admission  Axiel Fjeld 12/16/2014, 11:16 PM  [facility]  Pager [phone]   after 2 AM please page floor coverage PA If 7AM-7PM, please contact the day team taking care of the patient  [email or web address]  Password [email or web address]

## 2014-12-16 NOTE — ED Notes (Signed)
Gave pt graham crackers, peanut butter, and grape juice for low CBG, MD made aware.

## 2014-12-16 NOTE — ED Notes (Addendum)
Per EMS: Pt tripped and fell down 2 stairs and injured lt knee.  Was feeling weak and sweaty.  CBG was 66, gave 25 oral glucose, rechecked, now down to 54, order from Dr. Ethelda ChickJacubowitz to give more oral glucose.  25 more given.  Gave 50 mcg fentanyl intranasally en route.  Able to move distal foot but cannot stand.  Possible deformity to lt knee.

## 2014-12-16 NOTE — ED Notes (Signed)
Unable to gain IV access before transporting pt to floor. Charge nurse aware.

## 2014-12-16 NOTE — ED Notes (Signed)
Pt unable to bear weight on left knee.

## 2014-12-16 NOTE — ED Notes (Signed)
Bed: WA09 Expected date:  Expected time:  Means of arrival:  Comments: Fall bariatric

## 2014-12-16 NOTE — ED Provider Notes (Signed)
CSN: [id number]     Arrival date & time 12/16/14  1620 History   First MD Initiated Contact with Patient 12/16/14 1723     Chief Complaint  Patient presents with  . Fall  . Knee Pain     HPI Pt was seen at 1750. Per pt, c/o sudden onset and resolution of one episode of trip and fall while walking on a step PTA. Pt states he "thinks" he initially fell onto his left knee, then onto his buttocks. Pt c/o left knee pain only. Pt states he was unable to weight bear onto his left knee due to pain. Denies hitting head, no LOC, no neck or back pain, no prodromal symptoms before fall, no focal motor weakness, no tingling/numbness in extremities.    Past Medical History  Diagnosis Date  . CHF (congestive heart failure)     EF55-60%  . HTN (hypertension)   . Ptosis   . Atrial flutter   . Atrial fibrillation   . Morbid obesity   . DM2 (diabetes mellitus, type 2)   . Depression   . Chronic low back pain   . Asthma   . Vision abnormalities   . Abscess     right thigh  . Sleep apnea   . Chronic respiratory failure   . Obesity hypoventilation syndrome    Past Surgical History  Procedure Laterality Date  . Tonsillectomy    . Cholecystectomy    . Tonsillectomy    . Irrigation and debridement abscess  10/13/2011    Procedure: IRRIGATION AND DEBRIDEMENT ABSCESS;  Surgeon: Emelia Loron, MD;  Location: [facility] OR;  Service: General;  Laterality: Right;  Irrigation and debridement right thigh abscess  . Application of wound vac      right thigh abscess  . Breath tek h pylori N/A 10/06/2014    Procedure: BREATH TEK H PYLORI;  Surgeon: Valarie Merino, MD;  Location: [facility];  Service: General;  Laterality: N/A;   Family History  Problem Relation Age of Onset  . Coronary artery disease Mother     39s; father had it in 67s; both deceased in their 81s    . Cancer Mother     liver  . Heart disease Father   . Cancer Father     prostate   History  Substance Use Topics  . Smoking status:  Never Smoker   . Smokeless tobacco: Not on file     Comment: no significant tobacco use   . Alcohol Use: 0.6 oz/week    1 Standard drinks or equivalent per week     Comment: no significant use-- rare    Review of Systems ROS: Statement: All systems negative except as marked or noted in the HPI; Constitutional: Negative for fever and chills. ; ; Eyes: Negative for eye pain, redness and discharge. ; ; ENMT: Negative for ear pain, hoarseness, nasal congestion, sinus pressure and sore throat. ; ; Cardiovascular: Negative for chest pain, palpitations, diaphoresis, dyspnea and peripheral edema. ; ; Respiratory: Negative for cough, wheezing and stridor. ; ; Gastrointestinal: Negative for nausea, vomiting, diarrhea, abdominal pain, blood in stool, hematemesis, jaundice and rectal bleeding. . ; ; Genitourinary: Negative for dysuria, flank pain and hematuria. ; ; Musculoskeletal: +left knee pain. Negative for back pain and neck pain. Negative for deformity..; ; Skin: Negative for pruritus, rash, abrasions, blisters, bruising and skin lesion.; ; Neuro: Negative for headache, lightheadedness and neck stiffness. Negative for weakness, altered level of consciousness , altered mental status, extremity  weakness, paresthesias, involuntary movement, seizure and syncope.      Allergies  Iodine and Shellfish allergy  Home Medications   Prior to Admission medications   Medication Sig Start Date End Date Taking? Authorizing Provider  albuterol (VENTOLIN HFA) 108 (90 BASE) MCG/ACT inhaler Inhale 2 puffs into the lungs every 6 (six) hours as needed. Shortness of breath 03/30/14  Yes Alison Murray, MD  ALPRAZolam Prudy Feeler) 0.5 MG tablet Take 1 tablet by mouth daily as needed for anxiety.  11/29/14  Yes Historical Provider, MD  amiodarone (PACERONE) 200 MG tablet Take 200 mg by mouth daily.   Yes Historical Provider, MD  amLODipine (NORVASC) 10 MG tablet Take 10 mg by mouth daily.  11/13/11  Yes Historical Provider, MD   buPROPion (WELLBUTRIN XL) 300 MG 24 hr tablet Take 300 mg by mouth Daily.  03/13/12  Yes Historical Provider, MD  cyclobenzaprine (FLEXERIL) 10 MG tablet Take 10 mg by mouth 3 (three) times daily as needed for muscle spasms.   Yes Historical Provider, MD  DILT-XR 240 MG 24 hr capsule Take 1 capsule by mouth daily. 10/14/14  Yes Historical Provider, MD  divalproex (DEPAKOTE) 500 MG DR tablet Take 1,000 mg by mouth daily.    Yes Historical Provider, MD  HUMALOG MIX 75/25 (75-25) 100 UNIT/ML SUSP Inject 40-75 Units into the skin Three times a day before meals. Takes 75 units before breakfast Takes 40 units before lunch Takes 75 units before supper 03/13/12  Yes Historical Provider, MD  hydrALAZINE (APRESOLINE) 10 MG tablet Take 1 tablet (10 mg total) by mouth every 8 (eight) hours. Patient taking differently: Take 10 mg by mouth daily.  03/30/14  Yes Alison Murray, MD  insulin aspart (NOVOLOG) 100 UNIT/ML injection Sliding scale   CBG 70 - 120: 0 units:  CBG 121 - 150: 2 units;  CBG 151 - 200: 3 units;  CBG 201 - 250: 5 units;  CBG 251 - 300: 8 units; CBG 301 - 350: 11 units; CBG 351 - 400: 15 units;   Use while on steroids/prednisone. 11/29/14  Yes Maryann Mikhail, DO  lamoTRIgine (LAMICTAL) 25 MG tablet Take 25 mg by mouth daily.    Yes Historical Provider, MD  metoprolol (TOPROL-XL) 50 MG 24 hr tablet Take 50 mg by mouth daily. 05/10/11  Yes Lewayne Bunting, MD  Naftifine HCl (NAFTIN) 2 % CREA Apply 1 application topically daily. Apply to feet.   Yes Historical Provider, MD  nitroGLYCERIN (NITROSTAT) 0.4 MG SL tablet Place 0.4 mg under the tongue once.   Yes Historical Provider, MD  rivaroxaban (XARELTO) 20 MG TABS tablet Take 1 tablet (20 mg total) by mouth daily with supper. 06/26/14  Yes Simonne Martinet, NP  azithromycin (ZITHROMAX) 500 MG tablet Take 1 tablet (500 mg total) by mouth daily. 11/29/14   Maryann Mikhail, DO  ibuprofen (ADVIL,MOTRIN) 600 MG tablet Take 1 tablet (600 mg total) by  mouth every 6 (six) hours as needed for moderate pain. Patient not taking: Reported on 11/26/2014 06/26/14   Simonne Martinet, NP  predniSONE (STERAPRED UNI-PAK) 10 MG tablet Take by mouth daily. Prednisone dosing: Take  Prednisone 40mg  (4 tabs) x 3 days, then taper to 30mg  (3 tabs) x 3 days, then 20mg  (2 tabs) x 3days, then 10mg  (1 tab) x 3days, then STOP. 11/29/14   Maryann Mikhail, DO   BP 149/76 mmHg  Pulse 101  Temp(Src) 97.9 F (36.6 C) (Oral)  Resp 18  Wt 575 lb (260.818  kg)  SpO2 92% Physical Exam  1755: Physical examination:  Nursing notes reviewed; Vital signs and O2 SAT reviewed;  Constitutional: Well developed, Well nourished, Well hydrated, In no acute distress; Head:  Normocephalic, atraumatic; Eyes: EOMI, PERRL, No scleral icterus; ENMT: Mouth and pharynx normal, Mucous membranes moist; Neck: Supple, Full range of motion; Cardiovascular: Irregular rate and rhythm, No gallop; Respiratory: Breath sounds clear & equal bilaterally, No wheezes.  Speaking full sentences with ease, Normal respiratory effort/excursion; Chest: Nontender, Movement normal; Abdomen: Soft, obese. Nontender, Normal bowel sounds;; Extremities: Pulses normal, No deformity. No edema, No calf edema or asymmetry. +decreased ROM F/E left knee due to pt c/o increasing pain. No gross ligamentous laxity.  NMS intact left foot, strong pedal pp. +plantarflexion of left foot w/calf squeeze.  No palpable gap left Achilles's tendon.  No proximal fibular head tenderness. NT left hip/ankle/foot.  No edema, erythema, warmth, ecchymosis, open wounds, or obvious deformity. +generalized TTP left patellar area.;; Spine:  No midline CS, TS, LS tenderness.;; Neuro: AA&Ox3, Major CN grossly intact.  Speech clear. No gross focal motor deficits in extremities.; Skin: Color normal, Warm, Dry.   ED Course  Procedures    EKG Interpretation None      MDM  MDM Reviewed: previous chart, nursing note and vitals Interpretation: x-ray   Dg  Tibia/fibula Left 12/16/2014   CLINICAL DATA:  Fall down steps with knee and lower leg pain, initial encounter  EXAM: LEFT TIBIA AND FIBULA - 2 VIEW  COMPARISON:  None.  FINDINGS: There is no evidence of fracture or other focal bone lesions. Soft tissues are unremarkable.  IMPRESSION: No acute abnormality is noted.   Electronically Signed   By: Alcide CleverMark  Lukens M.D.   On: 12/16/2014 19:27   Dg Femur Min 2 Views Left 12/16/2014   CLINICAL DATA:  Recent fall down stairs with leg pain  EXAM: LEFT FEMUR 2 VIEWS  COMPARISON:  None.  FINDINGS: There is no evidence of fracture or other focal bone lesions. Soft tissues are unremarkable.  IMPRESSION: No acute abnormality is noted.   Electronically Signed   By: Alcide CleverMark  Lukens M.D.   On: 12/16/2014 19:45    Results for orders placed or performed during the hospital encounter of 12/16/14  CBC  Result Value Ref Range   WBC 13.5 (H) 4.0 - 10.5 K/uL   RBC 4.92 4.22 - 5.81 MIL/uL   Hemoglobin 13.5 13.0 - 17.0 g/dL   HCT 16.141.2 09.639.0 - 04.552.0 %   MCV 83.7 78.0 - 100.0 fL   MCH 27.4 26.0 - 34.0 pg   MCHC 32.8 30.0 - 36.0 g/dL   RDW 40.916.6 (H) 81.111.5 - 91.415.5 %   Platelets 207 150 - 400 K/uL  CBG monitoring, ED  Result Value Ref Range   Glucose-Capillary 36 (LL) 70 - 99 mg/dL   Comment 1 Document in Chart   CBG monitoring, ED  Result Value Ref Range   Glucose-Capillary 89 70 - 99 mg/dL  I-Stat Chem 8, ED  Result Value Ref Range   Sodium 144 135 - 145 mmol/L   Potassium 4.0 3.5 - 5.1 mmol/L   Chloride 108 96 - 112 mmol/L   BUN 15 6 - 23 mg/dL   Creatinine, Ser 7.821.10 0.50 - 1.35 mg/dL   Glucose, Bld 956138 (H) 70 - 99 mg/dL   Calcium, Ion 2.131.11 (L) 1.12 - 1.23 mmol/L   TCO2 22 0 - 100 mmol/L   Hemoglobin 15.0 13.0 - 17.0 g/dL   HCT 44.0  39.0 - 52.0 %     2055:  PE performed and XR obtained within the limits of pt's body habitus. XR without acute fx. Pt states he already has rolling walker at home. Unable to fit knee immobilizer due to body habitus; ACE wrap applied.    2300:   Attempted to stand pt with multiple ED staff and walker for WBAT; pt unable to stand at bedside due to left knee pain. Pt states he lives alone. CBG decreased. Pt given PO glucose and food with improvement.  Dx and testing d/w pt.  Questions answered.  Verb understanding, agreeable to observation admit.  T/C to [facility] Dr. Adela Glimpse, case discussed, including:  HPI, pertinent PM/SHx, VS/PE, dx testing, ED course and treatment:  Agreeable to admit, requests to write temporary orders, obtain observation medical bed to team [facility].   Samuel Jester, DO 12/18/14 1356

## 2014-12-17 ENCOUNTER — Observation Stay (HOSPITAL_COMMUNITY): Payer: Medicare Other

## 2014-12-17 DIAGNOSIS — E162 Hypoglycemia, unspecified: Secondary | ICD-10-CM | POA: Diagnosis not present

## 2014-12-17 DIAGNOSIS — G4733 Obstructive sleep apnea (adult) (pediatric): Secondary | ICD-10-CM

## 2014-12-17 DIAGNOSIS — M25562 Pain in left knee: Secondary | ICD-10-CM | POA: Diagnosis not present

## 2014-12-17 LAB — CBC
HCT: 37 % — ABNORMAL LOW (ref 39.0–52.0)
HEMOGLOBIN: 12.1 g/dL — AB (ref 13.0–17.0)
MCH: 27.5 pg (ref 26.0–34.0)
MCHC: 32.7 g/dL (ref 30.0–36.0)
MCV: 84.1 fL (ref 78.0–100.0)
PLATELETS: 179 10*3/uL (ref 150–400)
RBC: 4.4 MIL/uL (ref 4.22–5.81)
RDW: 16.5 % — ABNORMAL HIGH (ref 11.5–15.5)
WBC: 10.2 10*3/uL (ref 4.0–10.5)

## 2014-12-17 LAB — GLUCOSE, CAPILLARY
GLUCOSE-CAPILLARY: 108 mg/dL — AB (ref 70–99)
GLUCOSE-CAPILLARY: 134 mg/dL — AB (ref 70–99)
GLUCOSE-CAPILLARY: 95 mg/dL (ref 70–99)
GLUCOSE-CAPILLARY: 103 mg/dL — AB (ref 70–99)
Glucose-Capillary: 121 mg/dL — ABNORMAL HIGH (ref 70–99)

## 2014-12-17 LAB — MAGNESIUM: MAGNESIUM: 2 mg/dL (ref 1.5–2.5)

## 2014-12-17 LAB — COMPREHENSIVE METABOLIC PANEL
ALK PHOS: 43 U/L (ref 39–117)
ALT: 12 U/L (ref 0–53)
AST: 17 U/L (ref 0–37)
Albumin: 3.2 g/dL — ABNORMAL LOW (ref 3.5–5.2)
Anion gap: 10 (ref 5–15)
BUN: 15 mg/dL (ref 6–23)
CALCIUM: 8.5 mg/dL (ref 8.4–10.5)
CO2: 28 mmol/L (ref 19–32)
Chloride: 107 mmol/L (ref 96–112)
Creatinine, Ser: 1.17 mg/dL (ref 0.50–1.35)
GFR, EST AFRICAN AMERICAN: 86 mL/min — AB (ref >90)
GFR, EST NON AFRICAN AMERICAN: 74 mL/min — AB (ref >90)
Glucose, Bld: 161 mg/dL — ABNORMAL HIGH (ref 70–99)
Potassium: 3.8 mmol/L (ref 3.5–5.1)
SODIUM: 145 mmol/L (ref 135–145)
TOTAL PROTEIN: 6 g/dL (ref 6.0–8.3)
Total Bilirubin: 0.9 mg/dL (ref 0.3–1.2)

## 2014-12-17 LAB — PHOSPHORUS: PHOSPHORUS: 4 mg/dL (ref 2.3–4.6)

## 2014-12-17 LAB — TSH: TSH: 1.098 u[IU]/mL (ref 0.350–4.500)

## 2014-12-17 MED ORDER — LAMOTRIGINE 25 MG PO TABS
25.0000 mg | ORAL_TABLET | Freq: Every day | ORAL | Status: DC
  Administered 2014-12-17 – 2014-12-23 (×6): 25 mg via ORAL
  Filled 2014-12-17 (×7): qty 1

## 2014-12-17 MED ORDER — ONDANSETRON HCL 4 MG/2ML IJ SOLN: 4.0000 mg | Freq: Four times a day (QID) | INTRAMUSCULAR | Status: DC | PRN

## 2014-12-17 MED ORDER — INSULIN ASPART 100 UNIT/ML ~~LOC~~ SOLN
0.0000 [IU] | Freq: Every day | SUBCUTANEOUS | Status: DC
  Administered 2014-12-21: 2 [IU] via SUBCUTANEOUS

## 2014-12-17 MED ORDER — HYDRALAZINE HCL 10 MG PO TABS
10.0000 mg | ORAL_TABLET | Freq: Every day | ORAL | Status: DC
  Administered 2014-12-17 – 2014-12-23 (×5): 10 mg via ORAL
  Filled 2014-12-17 (×6): qty 1

## 2014-12-17 MED ORDER — ALPRAZOLAM 0.5 MG PO TABS
0.5000 mg | ORAL_TABLET | Freq: Every day | ORAL | Status: DC | PRN
  Administered 2014-12-17 – 2014-12-22 (×5): 0.5 mg via ORAL
  Filled 2014-12-17 (×5): qty 1

## 2014-12-17 MED ORDER — INSULIN ASPART 100 UNIT/ML ~~LOC~~ SOLN
0.0000 [IU] | Freq: Three times a day (TID) | SUBCUTANEOUS | Status: DC
  Administered 2014-12-17 (×2): 2 [IU] via SUBCUTANEOUS
  Administered 2014-12-18: 3 [IU] via SUBCUTANEOUS
  Administered 2014-12-18 – 2014-12-19 (×2): 2 [IU] via SUBCUTANEOUS
  Administered 2014-12-20: 3 [IU] via SUBCUTANEOUS
  Administered 2014-12-20: 2 [IU] via SUBCUTANEOUS
  Administered 2014-12-21 (×2): 3 [IU] via SUBCUTANEOUS
  Administered 2014-12-22: 2 [IU] via SUBCUTANEOUS
  Administered 2014-12-22: 3 [IU] via SUBCUTANEOUS
  Administered 2014-12-22 – 2014-12-23 (×3): 2 [IU] via SUBCUTANEOUS

## 2014-12-17 MED ORDER — ACETAMINOPHEN 325 MG PO TABS
650.0000 mg | ORAL_TABLET | Freq: Four times a day (QID) | ORAL | Status: DC | PRN
  Administered 2014-12-17 – 2014-12-20 (×8): 650 mg via ORAL
  Filled 2014-12-17 (×8): qty 2

## 2014-12-17 MED ORDER — SENNA 8.6 MG PO TABS
1.0000 | ORAL_TABLET | Freq: Two times a day (BID) | ORAL | Status: DC
  Administered 2014-12-17 – 2014-12-23 (×11): 8.6 mg via ORAL
  Filled 2014-12-17 (×8): qty 1

## 2014-12-17 MED ORDER — DOCUSATE SODIUM 100 MG PO CAPS
100.0000 mg | ORAL_CAPSULE | Freq: Two times a day (BID) | ORAL | Status: DC
  Administered 2014-12-17 – 2014-12-21 (×7): 100 mg via ORAL
  Filled 2014-12-17 (×5): qty 1

## 2014-12-17 MED ORDER — ONDANSETRON HCL 4 MG PO TABS: 4.0000 mg | ORAL_TABLET | Freq: Four times a day (QID) | ORAL | Status: DC | PRN

## 2014-12-17 MED ORDER — IBUPROFEN 200 MG PO TABS
600.0000 mg | ORAL_TABLET | Freq: Four times a day (QID) | ORAL | Status: DC | PRN
Start: 2014-12-17 — End: 2014-12-21
  Administered 2014-12-17 – 2014-12-20 (×3): 600 mg via ORAL
  Filled 2014-12-17 (×4): qty 3

## 2014-12-17 MED ORDER — INSULIN ASPART PROT & ASPART (70-30 MIX) 100 UNIT/ML ~~LOC~~ SUSP
55.0000 [IU] | Freq: Two times a day (BID) | SUBCUTANEOUS | Status: DC
  Administered 2014-12-17 – 2014-12-23 (×11): 55 [IU] via SUBCUTANEOUS
  Filled 2014-12-17 (×2): qty 10

## 2014-12-17 MED ORDER — RIVAROXABAN 20 MG PO TABS
20.0000 mg | ORAL_TABLET | Freq: Every day | ORAL | Status: DC
  Administered 2014-12-17: 20 mg via ORAL
  Filled 2014-12-17 (×2): qty 1

## 2014-12-17 MED ORDER — AMIODARONE HCL 100 MG PO TABS
200.0000 mg | ORAL_TABLET | Freq: Every day | ORAL | Status: DC
  Administered 2014-12-17 – 2014-12-23 (×6): 200 mg via ORAL
  Filled 2014-12-17: qty 2
  Filled 2014-12-17: qty 1
  Filled 2014-12-17: qty 2
  Filled 2014-12-17 (×3): qty 1

## 2014-12-17 MED ORDER — CYCLOBENZAPRINE HCL 10 MG PO TABS
10.0000 mg | ORAL_TABLET | Freq: Three times a day (TID) | ORAL | Status: DC | PRN
  Administered 2014-12-17 – 2014-12-23 (×8): 10 mg via ORAL
  Filled 2014-12-17 (×8): qty 1

## 2014-12-17 MED ORDER — POLYETHYLENE GLYCOL 3350 17 G PO PACK: 17.0000 g | PACK | Freq: Every day | ORAL | Status: DC | PRN

## 2014-12-17 MED ORDER — METOPROLOL SUCCINATE ER 50 MG PO TB24
50.0000 mg | ORAL_TABLET | Freq: Every day | ORAL | Status: DC
  Administered 2014-12-17 – 2014-12-23 (×6): 50 mg via ORAL
  Filled 2014-12-17 (×6): qty 1

## 2014-12-17 MED ORDER — AMLODIPINE BESYLATE 10 MG PO TABS
10.0000 mg | ORAL_TABLET | Freq: Every day | ORAL | Status: DC
  Administered 2014-12-17 – 2014-12-23 (×5): 10 mg via ORAL
  Filled 2014-12-17 (×6): qty 1

## 2014-12-17 MED ORDER — ALBUTEROL SULFATE (2.5 MG/3ML) 0.083% IN NEBU
2.5000 mg | INHALATION_SOLUTION | RESPIRATORY_TRACT | Status: DC | PRN
Start: 2014-12-17 — End: 2014-12-23
  Administered 2014-12-18 – 2014-12-19 (×2): 2.5 mg via RESPIRATORY_TRACT
  Filled 2014-12-17 (×2): qty 3

## 2014-12-17 MED ORDER — DILTIAZEM HCL ER 240 MG PO CP24
240.0000 mg | ORAL_CAPSULE | Freq: Every day | ORAL | Status: DC
Start: 2014-12-17 — End: 2014-12-23
  Administered 2014-12-17 – 2014-12-23 (×6): 240 mg via ORAL
  Filled 2014-12-17 (×7): qty 1

## 2014-12-17 MED ORDER — ACETAMINOPHEN 650 MG RE SUPP: 650.0000 mg | Freq: Four times a day (QID) | RECTAL | Status: DC | PRN

## 2014-12-17 MED ORDER — SODIUM CHLORIDE 0.9 % IJ SOLN: 3.0000 mL | INTRAMUSCULAR | Status: DC | PRN

## 2014-12-17 MED ORDER — MORPHINE SULFATE 2 MG/ML IJ SOLN
2.0000 mg | Freq: Once | INTRAMUSCULAR | Status: AC
  Administered 2014-12-17: 2 mg via INTRAVENOUS
  Filled 2014-12-17: qty 1

## 2014-12-17 MED ORDER — SODIUM CHLORIDE 0.9 % IJ SOLN: 3.0000 mL | Freq: Two times a day (BID) | INTRAMUSCULAR | Status: DC

## 2014-12-17 MED ORDER — DIVALPROEX SODIUM 500 MG PO DR TAB
1000.0000 mg | DELAYED_RELEASE_TABLET | Freq: Every day | ORAL | Status: DC
  Administered 2014-12-17 – 2014-12-23 (×6): 1000 mg via ORAL
  Filled 2014-12-17 (×6): qty 2

## 2014-12-17 MED ORDER — BUPROPION HCL ER (XL) 150 MG PO TB24
300.0000 mg | ORAL_TABLET | Freq: Every day | ORAL | Status: DC
  Administered 2014-12-17 – 2014-12-23 (×6): 300 mg via ORAL
  Filled 2014-12-17: qty 2
  Filled 2014-12-17: qty 1
  Filled 2014-12-17: qty 2
  Filled 2014-12-17 (×3): qty 1

## 2014-12-17 MED ORDER — SODIUM CHLORIDE 0.9 % IV SOLN: 250.0000 mL | INTRAVENOUS | Status: DC | PRN

## 2014-12-17 MED ORDER — BISACODYL 10 MG RE SUPP: 10.0000 mg | Freq: Every day | RECTAL | Status: DC | PRN

## 2014-12-17 MED ORDER — OXYCODONE HCL 5 MG PO TABS
5.0000 mg | ORAL_TABLET | ORAL | Status: DC | PRN
  Administered 2014-12-17 – 2014-12-18 (×5): 5 mg via ORAL
  Filled 2014-12-17 (×5): qty 1

## 2014-12-17 MED ORDER — MORPHINE SULFATE 2 MG/ML IJ SOLN
2.0000 mg | INTRAMUSCULAR | Status: DC | PRN
  Administered 2014-12-18 – 2014-12-21 (×15): 2 mg via INTRAVENOUS
  Filled 2014-12-17 (×15): qty 1

## 2014-12-17 MED ORDER — FLEET ENEMA 7-19 GM/118ML RE ENEM: 1.0000 | ENEMA | Freq: Once | RECTAL | Status: AC | PRN

## 2014-12-17 MED ORDER — ALBUTEROL SULFATE HFA 108 (90 BASE) MCG/ACT IN AERS: 2.0000 | INHALATION_SPRAY | RESPIRATORY_TRACT | Status: DC | PRN

## 2014-12-17 NOTE — Progress Notes (Signed)
Sticky physician note done to request PT consult for this pt.states he is unable to stand or bear weight on left leg since he fell yesterday prior to adm. Linward HeadlandBeverly, Torey Regan D

## 2014-12-17 NOTE — Consult Note (Signed)
ORTHOPAEDIC CONSULTATION  REQUESTING PHYSICIAN: Geradine Girt, DO  Chief Complaint: left knee pain  HPI: [name] is a 44 y.o. male who is morbidly obese and slipped and fell onto his left knee. He is unable to ambulate on it. No other c/o  Past Medical History  Diagnosis Date  . CHF (congestive heart failure)     EF55-60%  . HTN (hypertension)   . Ptosis   . Atrial flutter   . Atrial fibrillation   . Morbid obesity   . DM2 (diabetes mellitus, type 2)   . Depression   . Chronic low back pain   . Asthma   . Vision abnormalities   . Abscess     right thigh  . Sleep apnea   . Chronic respiratory failure   . Obesity hypoventilation syndrome    Past Surgical History  Procedure Laterality Date  . Tonsillectomy    . Cholecystectomy    . Tonsillectomy    . Irrigation and debridement abscess  10/13/2011    Procedure: IRRIGATION AND DEBRIDEMENT ABSCESS;  Surgeon: Rolm Bookbinder, MD;  Location: [facility];  Service: General;  Laterality: Right;  Irrigation and debridement right thigh abscess  . Application of wound vac      right thigh abscess  . Breath tek h pylori N/A 10/06/2014    Procedure: BREATH TEK H PYLORI;  Surgeon: Pedro Earls, MD;  Location: [facility];  Service: General;  Laterality: N/A;   History   Social History  . Marital Status: Divorced    Spouse Name: N/A  . Number of Children: N/A  . Years of Education: N/A   Occupational History  . unemployeed    Social History Main Topics  . Smoking status: Never Smoker   . Smokeless tobacco: Not on file     Comment: no significant tobacco use   . Alcohol Use: 0.6 oz/week    1 Standard drinks or equivalent per week     Comment: no significant use-- rare  . Drug Use: No  . Sexual Activity: Not on file   Other Topics Concern  . None   Social History Narrative   Lives in [place]; works full time at [employer].    Occasionally takes echinacea and B12 supplements.    Regular diet; has not exercised  regularly over the last 3 months.    Family History  Problem Relation Age of Onset  . Coronary artery disease Mother     67s; father had it in 59s; both deceased in their 2s    . Cancer Mother     liver  . Heart disease Father   . Cancer Father     prostate   Allergies  Allergen Reactions  . Iodine Anaphylaxis and Other (See Comments)    Associated with the shellfish allergy  . Shellfish Allergy Anaphylaxis   Prior to Admission medications   Medication Sig Start Date End Date Taking? Authorizing Provider  albuterol (VENTOLIN HFA) 108 (90 BASE) MCG/ACT inhaler Inhale 2 puffs into the lungs every 6 (six) hours as needed. Shortness of breath 03/30/14  Yes Robbie Lis, MD  ALPRAZolam Duanne Moron) 0.5 MG tablet Take 1 tablet by mouth daily as needed for anxiety.  11/29/14  Yes Historical Provider, MD  amiodarone (PACERONE) 200 MG tablet Take 200 mg by mouth daily.   Yes Historical Provider, MD  amLODipine (NORVASC) 10 MG tablet Take 10 mg by mouth daily.  11/13/11  Yes Historical Provider, MD  buPROPion (WELLBUTRIN XL) 300 MG 24 hr tablet Take 300 mg by mouth Daily.  03/13/12  Yes Historical Provider, MD  cyclobenzaprine (FLEXERIL) 10 MG tablet Take 10 mg by mouth 3 (three) times daily as needed for muscle spasms.   Yes Historical Provider, MD  DILT-XR 240 MG 24 hr capsule Take 1 capsule by mouth daily. 10/14/14  Yes Historical Provider, MD  divalproex (DEPAKOTE) 500 MG DR tablet Take 1,000 mg by mouth daily.    Yes Historical Provider, MD  HUMALOG MIX 75/25 (75-25) 100 UNIT/ML SUSP Inject 40-75 Units into the skin Three times a day before meals. Takes 75 units before breakfast Takes 40 units before lunch Takes 75 units before supper 03/13/12  Yes Historical Provider, MD  hydrALAZINE (APRESOLINE) 10 MG tablet Take 1 tablet (10 mg total) by mouth every 8 (eight) hours. Patient taking differently: Take 10 mg by mouth daily.  03/30/14  Yes Robbie Lis, MD  insulin aspart (NOVOLOG) 100 UNIT/ML  injection Sliding scale   CBG 70 - 120: 0 units:  CBG 121 - 150: 2 units;  CBG 151 - 200: 3 units;  CBG 201 - 250: 5 units;  CBG 251 - 300: 8 units; CBG 301 - 350: 11 units; CBG 351 - 400: 15 units;   Use while on steroids/prednisone. 11/29/14  Yes Maryann Mikhail, DO  lamoTRIgine (LAMICTAL) 25 MG tablet Take 25 mg by mouth daily.    Yes Historical Provider, MD  metoprolol (TOPROL-XL) 50 MG 24 hr tablet Take 50 mg by mouth daily. 05/10/11  Yes Lelon Perla, MD  Naftifine HCl (NAFTIN) 2 % CREA Apply 1 application topically daily. Apply to feet.   Yes Historical Provider, MD  nitroGLYCERIN (NITROSTAT) 0.4 MG SL tablet Place 0.4 mg under the tongue once.   Yes Historical Provider, MD  rivaroxaban (XARELTO) 20 MG TABS tablet Take 1 tablet (20 mg total) by mouth daily with supper. 06/26/14  Yes Erick Colace, NP  azithromycin (ZITHROMAX) 500 MG tablet Take 1 tablet (500 mg total) by mouth daily. 11/29/14   Maryann Mikhail, DO  HYDROcodone-acetaminophen (NORCO/VICODIN) 5-325 MG per tablet 1 tab PO BID prn pain 12/16/14   Francine Graven, DO  ibuprofen (ADVIL,MOTRIN) 600 MG tablet Take 1 tablet (600 mg total) by mouth every 6 (six) hours as needed for moderate pain. Patient not taking: Reported on 11/26/2014 06/26/14   Erick Colace, NP  predniSONE (STERAPRED UNI-PAK) 10 MG tablet Take by mouth daily. Prednisone dosing: Take  Prednisone 1m (4 tabs) x 3 days, then taper to 377m(3 tabs) x 3 days, then 2016m2 tabs) x 3days, then 45m23m tab) x 3days, then STOP. 11/29/14   MaryCristal Ford   Dg Knee 1-2 Views Left  12/17/2014   CLINICAL DATA:  Patient status post fall. Low blood sugar. Initial encounter.  EXAM: LEFT KNEE - 1-2 VIEW  COMPARISON:  Radiographs 12/16/2014  FINDINGS: The patella is high-riding. There is soft tissue swelling the location of the patellar tendon. No definite acute fracture. Mild degenerative changes medial compartment with joint space narrowing and osteophytosis.   IMPRESSION: Findings suspicious for patellar tendon rupture.   Electronically Signed   By: DrewLovey Newcomer.   On: 12/17/2014 16:25   Dg Knee 1-2 Views Right  12/17/2014   CLINICAL DATA:  Clinical suspicion for patella alta following [place]  EXAM: RIGHT KNEE - 1-2 VIEW  COMPARISON:  None.  FINDINGS: The patella is in a normal location. No fracture, dislocation or effusion seen. Mild medial and lateral spur formation and mild medial and lateral joint space narrowing. Minimal posterior patellar spur formation.  IMPRESSION: Degenerative changes.  No acute abnormality.   Electronically Signed   By: Claudie Revering M.D.   On: 12/17/2014 16:20   Dg Tibia/fibula Left  12/16/2014   CLINICAL DATA:  Fall down steps with knee and lower leg pain, initial encounter  EXAM: LEFT TIBIA AND FIBULA - 2 VIEW  COMPARISON:  None.  FINDINGS: There is no evidence of fracture or other focal bone lesions. Soft tissues are unremarkable.  IMPRESSION: No acute abnormality is noted.   Electronically Signed   By: Inez Catalina M.D.   On: 12/16/2014 19:27   Dg Femur Min 2 Views Left  12/16/2014   CLINICAL DATA:  Recent fall down stairs with leg pain  EXAM: LEFT FEMUR 2 VIEWS  COMPARISON:  None.  FINDINGS: There is no evidence of fracture or other focal bone lesions. Soft tissues are unremarkable.  IMPRESSION: No acute abnormality is noted.   Electronically Signed   By: Inez Catalina M.D.   On: 12/16/2014 19:45    Positive ROS: All other systems have been reviewed and were otherwise negative with the exception of those mentioned in the HPI and as above.  Labs cbc  Recent Labs  12/16/14 2142 12/16/14 2159 12/17/14 0440  WBC 13.5*  --  10.2  HGB 13.5 15.0 12.1*  HCT 41.2 44.0 37.0*  PLT 207  --  179    Labs inflam No results for input(s): CRP in the last 72 hours.  Invalid input(s): ESR  Labs coag No results for input(s): INR, PTT in the last 72 hours.  Invalid input(s):  PT   Recent Labs  12/16/14 2159 12/17/14 0440  NA 144 145  K 4.0 3.8  CL 108 107  CO2  --  28  GLUCOSE 138* 161*  BUN 15 15  CREATININE 1.10 1.17  CALCIUM  --  8.5    Physical Exam: Filed Vitals:   12/17/14 1340  BP: 114/64  Pulse: 84  Temp: 98.7 F (37.1 C)  Resp: 17   General: Alert, no acute distress Cardiovascular: No pedal edema Respiratory: No cyanosis, no use of accessory musculature GI: No organomegaly, abdomen is soft and non-tender Skin: No lesions in the area of chief complaint other than those listed below in MSK exam.  Neurologic: Sensation intact distally Psychiatric: Patient is competent for consent with normal mood and affect Lymphatic: No axillary or cervical lymphadenopathy  MUSCULOSKELETAL:  LLE: SILT DP/SP/S/S/T nerve, 2+ DP, +TA/GS/EHL Compartments soft TTP over patella tendon No active extension  Other extremities are atraumatic with painless ROM and NVI.  Assessment: Left patella tendon rupture  Plan: Patella tendon repair in [facility] Monday Weight Bearing Status: WBAT in knee immobilizer If he can mobilize and d/c I can do his surgery as an outpatient. If not please transfer him to [facility] by Sunday pm.  PT VTE px: SCD's and hold chemical px on Monday AM  I discussed with him that he is at increased risk of complications due to his obesity and DM. He would still like to go forward with surgery   Renette Butters, MD Cell [phone]   12/17/2014 7:40 PM

## 2014-12-17 NOTE — Consult Note (Signed)
I have reviewed his films and am concerned for possible patella alta indicating a patella tendon rupture. I have ordered knee films of both sides for comparison.   If he can d/c then f/u with me in clinic, if not I will perform formal consult within 24hrs.   Kynan Peasley D Cell: [phone] 

## 2014-12-17 NOTE — Progress Notes (Signed)
Pt stated he is known extreme diff IV stick,is taking po's well. New order for NSL- prn.Would need IV team for this if needed. Linward HeadlandBeverly, Isamu Trammel D

## 2014-12-17 NOTE — Progress Notes (Signed)
Pt requested to be placed on CPAP at midnight. RT will return at that time. 

## 2014-12-17 NOTE — Progress Notes (Addendum)
PROGRESS NOTE  [name] ZOX:[id number] DOB: 01/27/1971 DOA: 12/16/2014 PCP: Emeterio ReeveWOLTERS,SHARON A, MD   Patient presented with his  BG falling down he went to get some soda and fell on the steps hitting his Left knee.  Patient reports that he was recently on prednisone for his asthma and he was on NOvolog sliding scale now that his prednisone has finished he started to have lower blood sugars. Per EMS BG was down to 3 today. In ER [facility] films of the knee and hip were unremarkable but patient unable to bear weight.    Assessment/Plan: Left knee pain - plain x rays negative- pain management -Unable to obtain further imaging due to morbid obesity  -Dr. Eulah PontMurphy to review films- ? Ruptured patella tendon -PT Eval- was ambulatory prior to fall - follow CBC  A-fib - continue amiodarone, metoprolol and Cardizem continue anticoagulation   Asthma continue albuterol as needed   OSA (obstructive sleep apnea) C Pap machine as per home parameters   Hypoglycemia - decreased dose of insulin continue to monitor closely  Morbid obesity   Code Status: full Family Communication: patient Disposition Plan:    Consultants:  ortho  Procedures:      HPI/Subjective: 9/10 pain laying in bed  Objective: Filed Vitals:   12/17/14 0416  BP: 140/67  Pulse: 86  Temp: 97.9 F (36.6 C)  Resp: 18    Intake/Output Summary (Last 24 hours) at 12/17/14 0812 Last data filed at 12/17/14 0715  Gross per 24 hour  Intake    480 ml  Output   1550 ml  Net  -1070 ml   Filed Weights   12/16/14 1630 12/17/14 0005  Weight: 260.818 kg (575 lb) 258.55 kg (570 lb)    Exam:   General:  A+Ox3, NAD  Cardiovascular: irr  Respiratory: diminished due to body habitus  Abdomen: +BS, obese  Musculoskeletal: left knee in ACE wrap  Data Reviewed: Basic Metabolic Panel:  Recent Labs Lab 12/16/14 2159 12/17/14 0440  NA 144 145  K 4.0 3.8  CL 108 107  CO2  --  28  GLUCOSE 138* 161*  BUN 15  15  CREATININE 1.10 1.17  CALCIUM  --  8.5  MG  --  2.0  PHOS  --  4.0   Liver Function Tests:  Recent Labs Lab 12/17/14 0440  AST 17  ALT 12  ALKPHOS 43  BILITOT 0.9  PROT 6.0  ALBUMIN 3.2*   No results for input(s): LIPASE, AMYLASE in the last 168 hours. No results for input(s): AMMONIA in the last 168 hours. CBC:  Recent Labs Lab 12/16/14 2142 12/16/14 2159 12/17/14 0440  WBC 13.5*  --  10.2  HGB 13.5 15.0 12.1*  HCT 41.2 44.0 37.0*  MCV 83.7  --  84.1  PLT 207  --  179   Cardiac Enzymes: No results for input(s): CKTOTAL, CKMB, CKMBINDEX, TROPONINI in the last 168 hours. BNP (last 3 results)  Recent Labs  11/26/14 1628  BNP 81.8    ProBNP (last 3 results)  Recent Labs  03/25/14 2110  PROBNP 33.8    CBG:  Recent Labs Lab 12/16/14 1744 12/16/14 1919 12/17/14 0103 12/17/14 0732  GLUCAP 36* 89 108* 134*    No results found for this or any previous visit (from the past 240 hour(s)).   Studies: Dg Tibia/fibula Left  12/16/2014   CLINICAL DATA:  Fall down steps with knee and lower leg pain, initial encounter  EXAM: LEFT TIBIA AND FIBULA -  2 VIEW  COMPARISON:  None.  FINDINGS: There is no evidence of fracture or other focal bone lesions. Soft tissues are unremarkable.  IMPRESSION: No acute abnormality is noted.   Electronically Signed   By: Alcide Clever M.D.   On: 12/16/2014 19:27   Dg Femur Min 2 Views Left  12/16/2014   CLINICAL DATA:  Recent fall down stairs with leg pain  EXAM: LEFT FEMUR 2 VIEWS  COMPARISON:  None.  FINDINGS: There is no evidence of fracture or other focal bone lesions. Soft tissues are unremarkable.  IMPRESSION: No acute abnormality is noted.   Electronically Signed   By: Alcide Clever M.D.   On: 12/16/2014 19:45    Scheduled Meds: . amiodarone  200 mg Oral Daily  . amLODipine  10 mg Oral Daily  . buPROPion  300 mg Oral Daily  . diltiazem  240 mg Oral Daily  . divalproex  1,000 mg Oral Daily  . docusate sodium  100 mg Oral  BID  . hydrALAZINE  10 mg Oral Daily  . insulin aspart  0-15 Units Subcutaneous TID WC  . insulin aspart  0-5 Units Subcutaneous QHS  . insulin aspart protamine- aspart  55 Units Subcutaneous BID WC  . lamoTRIgine  25 mg Oral Daily  . metoprolol succinate  50 mg Oral Daily  . rivaroxaban  20 mg Oral Q supper  . senna  1 tablet Oral BID   Continuous Infusions:  Antibiotics Given (last 72 hours)    None      Active Problems:   Diabetes   CHF (congestive heart failure)   OSA (obstructive sleep apnea)   A-fib   Asthma   Left knee pain   Hypoglycemia    Time spent: 25 min    Gracious Renken  [facility] Pager [phone]. If 7PM-7AM, please contact night-coverage at [email or web address], password [facility] 12/17/2014, 8:12 AM

## 2014-12-17 NOTE — Progress Notes (Signed)
Pt placed on CPAP QHS.  Pt using hospital equipment.  Machine plugged into red outlet and humidifier filled with sterile water.  Pt CPAP setting is 20 per Pt request.

## 2014-12-17 NOTE — Progress Notes (Signed)
UR completed 

## 2014-12-18 DIAGNOSIS — S86812D Strain of other muscle(s) and tendon(s) at lower leg level, left leg, subsequent encounter: Secondary | ICD-10-CM | POA: Diagnosis not present

## 2014-12-18 DIAGNOSIS — Z7951 Long term (current) use of inhaled steroids: Secondary | ICD-10-CM | POA: Diagnosis not present

## 2014-12-18 DIAGNOSIS — M545 Low back pain: Secondary | ICD-10-CM | POA: Diagnosis present

## 2014-12-18 DIAGNOSIS — E662 Morbid (severe) obesity with alveolar hypoventilation: Secondary | ICD-10-CM | POA: Diagnosis present

## 2014-12-18 DIAGNOSIS — J9611 Chronic respiratory failure with hypoxia: Secondary | ICD-10-CM | POA: Diagnosis not present

## 2014-12-18 DIAGNOSIS — E875 Hyperkalemia: Secondary | ICD-10-CM | POA: Diagnosis present

## 2014-12-18 DIAGNOSIS — K219 Gastro-esophageal reflux disease without esophagitis: Secondary | ICD-10-CM | POA: Diagnosis present

## 2014-12-18 DIAGNOSIS — F329 Major depressive disorder, single episode, unspecified: Secondary | ICD-10-CM | POA: Diagnosis present

## 2014-12-18 DIAGNOSIS — I10 Essential (primary) hypertension: Secondary | ICD-10-CM | POA: Diagnosis not present

## 2014-12-18 DIAGNOSIS — Z6841 Body Mass Index (BMI) 40.0 and over, adult: Secondary | ICD-10-CM | POA: Diagnosis not present

## 2014-12-18 DIAGNOSIS — M25569 Pain in unspecified knee: Secondary | ICD-10-CM | POA: Diagnosis present

## 2014-12-18 DIAGNOSIS — Z794 Long term (current) use of insulin: Secondary | ICD-10-CM | POA: Diagnosis not present

## 2014-12-18 DIAGNOSIS — Z9981 Dependence on supplemental oxygen: Secondary | ICD-10-CM | POA: Diagnosis not present

## 2014-12-18 DIAGNOSIS — S76112A Strain of left quadriceps muscle, fascia and tendon, initial encounter: Secondary | ICD-10-CM | POA: Diagnosis not present

## 2014-12-18 DIAGNOSIS — S86811D Strain of other muscle(s) and tendon(s) at lower leg level, right leg, subsequent encounter: Secondary | ICD-10-CM | POA: Diagnosis not present

## 2014-12-18 DIAGNOSIS — Z79899 Other long term (current) drug therapy: Secondary | ICD-10-CM | POA: Diagnosis not present

## 2014-12-18 DIAGNOSIS — S86819A Strain of other muscle(s) and tendon(s) at lower leg level, unspecified leg, initial encounter: Secondary | ICD-10-CM

## 2014-12-18 DIAGNOSIS — I5032 Chronic diastolic (congestive) heart failure: Secondary | ICD-10-CM | POA: Diagnosis not present

## 2014-12-18 DIAGNOSIS — W010XXA Fall on same level from slipping, tripping and stumbling without subsequent striking against object, initial encounter: Secondary | ICD-10-CM | POA: Diagnosis present

## 2014-12-18 DIAGNOSIS — G8929 Other chronic pain: Secondary | ICD-10-CM | POA: Diagnosis present

## 2014-12-18 DIAGNOSIS — J45909 Unspecified asthma, uncomplicated: Secondary | ICD-10-CM | POA: Diagnosis present

## 2014-12-18 DIAGNOSIS — Z7952 Long term (current) use of systemic steroids: Secondary | ICD-10-CM | POA: Diagnosis not present

## 2014-12-18 DIAGNOSIS — I4892 Unspecified atrial flutter: Secondary | ICD-10-CM | POA: Diagnosis present

## 2014-12-18 DIAGNOSIS — G4733 Obstructive sleep apnea (adult) (pediatric): Secondary | ICD-10-CM | POA: Diagnosis not present

## 2014-12-18 DIAGNOSIS — E11649 Type 2 diabetes mellitus with hypoglycemia without coma: Secondary | ICD-10-CM | POA: Diagnosis not present

## 2014-12-18 DIAGNOSIS — I482 Chronic atrial fibrillation: Secondary | ICD-10-CM | POA: Diagnosis not present

## 2014-12-18 DIAGNOSIS — M25562 Pain in left knee: Secondary | ICD-10-CM | POA: Diagnosis not present

## 2014-12-18 DIAGNOSIS — I480 Paroxysmal atrial fibrillation: Secondary | ICD-10-CM | POA: Diagnosis not present

## 2014-12-18 DIAGNOSIS — E1165 Type 2 diabetes mellitus with hyperglycemia: Secondary | ICD-10-CM | POA: Diagnosis not present

## 2014-12-18 HISTORY — DX: Strain of other muscle(s) and tendon(s) at lower leg level, unspecified leg, initial encounter: S86.819A

## 2014-12-18 LAB — HEMOGLOBIN A1C
HEMOGLOBIN A1C: 8.8 % — AB (ref 4.8–5.6)
Mean Plasma Glucose: 206 mg/dL

## 2014-12-18 LAB — GLUCOSE, CAPILLARY
GLUCOSE-CAPILLARY: 156 mg/dL — AB (ref 70–99)
Glucose-Capillary: 82 mg/dL (ref 70–99)
Glucose-Capillary: 147 mg/dL — ABNORMAL HIGH (ref 70–99)
Glucose-Capillary: 154 mg/dL — ABNORMAL HIGH (ref 70–99)

## 2014-12-18 MED ORDER — RIVAROXABAN 20 MG PO TABS
20.0000 mg | ORAL_TABLET | Freq: Every day | ORAL | Status: AC
  Administered 2014-12-18: 20 mg via ORAL
  Filled 2014-12-18: qty 1

## 2014-12-18 MED ORDER — NITROGLYCERIN 0.4 MG SL SUBL
SUBLINGUAL_TABLET | SUBLINGUAL | Status: AC
  Administered 2014-12-18: 0.4 mg
  Filled 2014-12-18: qty 1

## 2014-12-18 MED ORDER — ENOXAPARIN SODIUM 150 MG/ML ~~LOC~~ SOLN
1.0000 mg/kg | Freq: Two times a day (BID) | SUBCUTANEOUS | Status: AC
  Administered 2014-12-19 – 2014-12-20 (×2): 260 mg via SUBCUTANEOUS
  Filled 2014-12-18 (×3): qty 2

## 2014-12-18 MED ORDER — ENOXAPARIN SODIUM 150 MG/ML ~~LOC~~ SOLN
1.0000 mg/kg | Freq: Two times a day (BID) | SUBCUTANEOUS | Status: DC
  Filled 2014-12-18 (×2): qty 2

## 2014-12-18 MED ORDER — OXYCODONE HCL 5 MG PO TABS
5.0000 mg | ORAL_TABLET | ORAL | Status: DC | PRN
  Administered 2014-12-18 – 2014-12-21 (×14): 10 mg via ORAL
  Filled 2014-12-18 (×14): qty 2

## 2014-12-18 MED ORDER — GI COCKTAIL ~~LOC~~
30.0000 mL | Freq: Once | ORAL | Status: AC
  Administered 2014-12-18: 30 mL via ORAL
  Filled 2014-12-18: qty 30

## 2014-12-18 NOTE — Significant Event (Signed)
Rapid Response Event Note  Overview: Time Called: 2000 Arrival Time: 2010 Event Type: Cardiac  Initial Focused Assessment:Patient c/o mid anterior chest pain 5/10. Morphine given prior to arrival. Alert warm and dry. Gradually pain decreased to 2/10  Interventions:EKG, NTG X 1 WITH SLIGHT BP DECREASED, GI COCKTAIL --V/S CHECKED FREQUENTLY AND STABLE. Labs ordered   Event Summary: Name of Physician Notified: Lenny PastelM CALLAHAN NP at 2015    at    Outcome: Transferred (Comment) [phone]([id number])  Event End Time: 2145  Bayard HuggerDenny, Gregorey Nabor B

## 2014-12-18 NOTE — Progress Notes (Signed)
Pt arrived on 4E and put on telemetry monitor. VSS and patient resting comfortably. I agree with previous RN assessment. Nader Boys, Lavone OrnSARA K, RN

## 2014-12-18 NOTE — Progress Notes (Signed)
Pt with c/o CP midsternal radiating across both sides of chest, describes as  alternating between aching and sharp, rates as 5, no shortness of breath pt warm and dry.  Placed on 2 L o2.  VSS, RR and RT called and responded immediately.  HHN given.  NP advised.  EKG ordered.  MSo4 2 mg IV given.  NP responded to scene.  EKG showed pt with alternating SR-Afib rate controlled. Pt verbalized decreasing pain.  Order to transfer pt to monitored unit.  Pt asleep shortly after pain med given. Report given to Huntley DecSara RN on 4E and pt transferred at 2145. Pt alert and verbalizing minimal aching pain of 1-2 .

## 2014-12-18 NOTE — Progress Notes (Signed)
[facility] progress note. Chief complaint. Chest pain. History of present illness. This 44 year old male in hospital with a patella tendon rupture. Patient has a prior history of congestive heart failure, hypertension, atrial fib/flutter. He complained to nursing of central chest pain without radiation, diaphoresis, or associated nausea. 12-lead EKG was obtained which does not look significantly different than prior EKG. The current EKG does not look indicative of acute ischemia. I came to see the patient at bedside. He complained of the burning pain to the central chest along with a sensation of pressure. He was given IV morphine and sublingual nitroglycerin with reported improvement in the pain. Vital signs. Temperature 97.9, pulse 96, respiration 18, blood pressure 140/72. O2 sats 100%. General appearance. Obese middle-aged male who is alert and in no distress. Cardiac. Somewhat heart sounds somewhat distant though rate and rhythm regular. Lungs. Breath sounds decreased but clear. Abdomen. Soft and obese with positive bowel sounds. Impression/plan. Problem #1. Chest pain. Current EKG does not appear significantly changed or indicative of acute ischemia. Nonetheless will repeat an EKG in approximately 6 hours to evaluate for any changes. We'll cycle troponins now and then every 6 hours for a total of 3 sets. We'll contact cardiology for any elevation in patient's troponin. We'll provide a GI cocktail as the patient believes this may be reflux. Will move to a telemetry floor for cardiac monitoring.

## 2014-12-18 NOTE — Progress Notes (Signed)
RT placed pt on [facility] with 3Lpm via ffm. Sterile water was added for humidification. Pt states he is comfortable and is tolerating CPAP well at this time. RT will continue to monitor as needed.

## 2014-12-18 NOTE — Progress Notes (Signed)
Pt felt like he "wasn't getting enough air"  CPAP with 3Lpm bled in (SpO2-94%). RT increased oxygen to 5Lpm. RT will continue to monitor as needed.

## 2014-12-18 NOTE — Progress Notes (Signed)
PROGRESS NOTE  [name] AVW:[id number] DOB: Apr 30, 1971 DOA: 12/16/2014 PCP: Emeterio Reeve, MD   Patient presented with his  BG falling down he went to get some soda and fell on the steps hitting his Left knee.  Patient reports that he was recently on prednisone for his asthma and he was on NOvolog sliding scale now that his prednisone has finished he started to have lower blood sugars. Per EMS BG was down to 3 today. In ER palin films of the knee and hip were unremarkable but patient unable to bear weight.    Assessment/Plan: Left patellar tendon rupture -pain control -Plan for Surgery Monday at [facility], unfortunately unable to get up with PT -continue knee immobilizer  -PT Eval- was ambulatory prior to fall - follow CBC  A-fib - continue amiodarone, metoprolol and Cardizem  -will need to stop Xarelto after todays dose and then use SQ lovenox till Sunday am, for Surgery Monday am  Asthma  -stable, continue albuterol as needed   OSA (obstructive sleep apnea) C Pap machine as per home parameters   DM -continue insulin 70/30 and SSI  Morbid obesity  DVT proph: on anticoagulation, see above  Code Status: full Family Communication: patient Disposition Plan: Tx to [facility] on Sunday for Surgery on Monday   Consultants:  ortho  Procedures:      HPI/Subjective: Pain in knee, just tried to get up with PT  Objective: Filed Vitals:   12/18/14 0843  BP: 113/61  Pulse:   Temp:   Resp:     Intake/Output Summary (Last 24 hours) at 12/18/14 1158 Last data filed at 12/18/14 0957  Gross per 24 hour  Intake   1320 ml  Output   2050 ml  Net   -730 ml   Filed Weights   12/16/14 1630 12/17/14 0005  Weight: 260.818 kg (575 lb) 258.55 kg (570 lb)    Exam:   General:  A+Ox3, NAD, morbidly obese  Cardiovascular: irr  Respiratory: diminished due to body habitus  Abdomen: +BS, obese  Musculoskeletal: left knee in ACE wrap  Data Reviewed: Basic Metabolic  Panel:  Recent Labs Lab 12/16/14 2159 12/17/14 0440  NA 144 145  K 4.0 3.8  CL 108 107  CO2  --  28  GLUCOSE 138* 161*  BUN 15 15  CREATININE 1.10 1.17  CALCIUM  --  8.5  MG  --  2.0  PHOS  --  4.0   Liver Function Tests:  Recent Labs Lab 12/17/14 0440  AST 17  ALT 12  ALKPHOS 43  BILITOT 0.9  PROT 6.0  ALBUMIN 3.2*   No results for input(s): LIPASE, AMYLASE in the last 168 hours. No results for input(s): AMMONIA in the last 168 hours. CBC:  Recent Labs Lab 12/16/14 2142 12/16/14 2159 12/17/14 0440  WBC 13.5*  --  10.2  HGB 13.5 15.0 12.1*  HCT 41.2 44.0 37.0*  MCV 83.7  --  84.1  PLT 207  --  179   Cardiac Enzymes: No results for input(s): CKTOTAL, CKMB, CKMBINDEX, TROPONINI in the last 168 hours. BNP (last 3 results)  Recent Labs  11/26/14 1628  BNP 81.8    ProBNP (last 3 results)  Recent Labs  03/25/14 2110  PROBNP 33.8    CBG:  Recent Labs Lab 12/17/14 1211 12/17/14 1654 12/17/14 2150 12/18/14 0742 12/18/14 1134  GLUCAP 95 121* 103* 82 147*    No results found for this or any previous visit (from the past 240  hour(s)).   Studies: Dg Knee 1-2 Views Left  12/17/2014   CLINICAL DATA:  Patient status post fall. Low blood sugar. Initial encounter.  EXAM: LEFT KNEE - 1-2 VIEW  COMPARISON:  Radiographs 12/16/2014  FINDINGS: The patella is high-riding. There is soft tissue swelling the location of the patellar tendon. No definite acute fracture. Mild degenerative changes medial compartment with joint space narrowing and osteophytosis.  IMPRESSION: Findings suspicious for patellar tendon rupture.   Electronically Signed   By: Annia Beltrew  Davis M.D.   On: 12/17/2014 16:25   Dg Knee 1-2 Views Right  12/17/2014   CLINICAL DATA:  Clinical suspicion for patella alta following [facility].  EXAM: RIGHT KNEE - 1-2 VIEW  COMPARISON:  None.  FINDINGS: The patella is in a normal location. No fracture, dislocation or  effusion seen. Mild medial and lateral spur formation and mild medial and lateral joint space narrowing. Minimal posterior patellar spur formation.  IMPRESSION: Degenerative changes.  No acute abnormality.   Electronically Signed   By: Beckie SaltsSteven  Reid M.D.   On: 12/17/2014 16:20   Dg Tibia/fibula Left  12/16/2014   CLINICAL DATA:  Fall down steps with knee and lower leg pain, initial encounter  EXAM: LEFT TIBIA AND FIBULA - 2 VIEW  COMPARISON:  None.  FINDINGS: There is no evidence of fracture or other focal bone lesions. Soft tissues are unremarkable.  IMPRESSION: No acute abnormality is noted.   Electronically Signed   By: Alcide CleverMark  Lukens M.D.   On: 12/16/2014 19:27   Dg Femur Min 2 Views Left  12/16/2014   CLINICAL DATA:  Recent fall down stairs with leg pain  EXAM: LEFT FEMUR 2 VIEWS  COMPARISON:  None.  FINDINGS: There is no evidence of fracture or other focal bone lesions. Soft tissues are unremarkable.  IMPRESSION: No acute abnormality is noted.   Electronically Signed   By: Alcide CleverMark  Lukens M.D.   On: 12/16/2014 19:45    Scheduled Meds: . amiodarone  200 mg Oral Daily  . amLODipine  10 mg Oral Daily  . buPROPion  300 mg Oral Daily  . diltiazem  240 mg Oral Daily  . divalproex  1,000 mg Oral Daily  . docusate sodium  100 mg Oral BID  . hydrALAZINE  10 mg Oral Daily  . insulin aspart  0-15 Units Subcutaneous TID WC  . insulin aspart  0-5 Units Subcutaneous QHS  . insulin aspart protamine- aspart  55 Units Subcutaneous BID WC  . lamoTRIgine  25 mg Oral Daily  . metoprolol succinate  50 mg Oral Daily  . rivaroxaban  20 mg Oral Q supper  . senna  1 tablet Oral BID   Continuous Infusions:  Antibiotics Given (last 72 hours)    None      Active Problems:   Diabetes   Morbid obesity   CHF (congestive heart failure)   OSA (obstructive sleep apnea)   A-fib   Asthma   Left knee pain   Hypoglycemia    Time spent: 25 min    [facility] RiverbendJOSEPH,Anett Ranker  [facility] Pager [phone]. If  7PM-7AM, please contact night-coverage at [email or web address], password [email or web address] 12/18/2014, 11:58 AM

## 2014-12-18 NOTE — Evaluation (Signed)
Physical Therapy Evaluation Patient Details Name: [name] MRN: [id number] DOB: 03/20/1971 Today's Date: 12/18/2014   History of Present Illness  [name].  [facility]  Clinical Impression  Pt s/p L patellar tendon rupture presents with decreased L LE strength/ROM, pain exacerbated with movement or WB, and obesity limiting functional mobility.  At this time pt is able to tolerate only min wt on L LE with standing and requires significant assist of 2 for all mobility tasks.  Pt is scheduled for surgery on Monday and will likely require rehab placement following.    Follow Up Recommendations SNF    Equipment Recommendations  None recommended by PT    Recommendations for Other Services OT consult     Precautions / Restrictions Precautions Precautions: Fall Required Braces or Orthoses: Knee Immobilizer - Left Knee Immobilizer - Left: On when out of bed or walking Restrictions Weight Bearing Restrictions: No      Mobility  Bed Mobility Overal bed mobility: +2 for physical assistance;Needs Assistance Bed Mobility: Supine to Sit;Sit to Supine     Supine to sit: Mod assist;+2 for physical assistance;+2 for safety/equipment Sit to supine: Mod assist;+2 for physical assistance;+2 for safety/equipment   General bed mobility comments: cues for sequencing and use of R LE to self assist  Transfers Overall transfer level: Needs assistance Equipment used: Rolling walker (2 wheeled) Transfers: Sit to/from Stand Sit to Stand: Mod assist;+2 physical assistance;+2 safety/equipment;From elevated surface         General transfer comment: cues for LE management and use of UEs to self assist - utilized bed to raise pt and allow standing  Ambulation/Gait Ambulation/Gait assistance: Min assist;+2 physical assistance;+2 safety/equipment Ambulation Distance (Feet): 5 Feet Assistive device: Rolling walker (2 wheeled) Gait  Pattern/deviations: Step-to pattern;Decreased step length - right;Decreased step length - left;Shuffle;Antalgic Gait velocity: decr   General Gait Details: Pt stepping up side of bed only for saftey.  Pt utilizing RW, bed behind LEs and assist of PTs to take short shuffling steps with significant increase in pain level  Stairs            Wheelchair Mobility    Modified Rankin (Stroke Patients Only)       Balance                                             Pertinent Vitals/Pain Pain Assessment: 0-10 Pain Score: 9  Pain Location: L thigh/knee Pain Descriptors / Indicators: Shooting Pain Intervention(s): Limited activity within patient's tolerance;Monitored during session;Premedicated before session;Patient requesting pain meds-RN notified    Home Living Family/patient expects to be discharged to:: Private residence Living Arrangements: Other relatives   Type of Home: House Home Access: Stairs to enter Entrance Stairs-Rails: Right Entrance Stairs-Number of Steps: 5 Home Layout: One level Home Equipment: Walker - 2 wheels Additional Comments: Pt states minimal assist available at home    Prior Function Level of Independence: Independent               Hand Dominance        Extremity/Trunk Assessment   Upper Extremity Assessment: Overall WFL for tasks assessed           Lower Extremity Assessment: LLE deficits/detail   LLE Deficits / Details: min strength with pt requiring full assist to move LE  Communication   Communication: No difficulties  Cognition Arousal/Alertness: Awake/alert Behavior During Therapy: WFL for tasks assessed/performed Overall Cognitive Status: Within Functional Limits for tasks assessed                      General Comments      Exercises        Assessment/Plan    PT Assessment Patient needs continued PT services  PT Diagnosis Difficulty walking   PT Problem List Decreased  strength;Decreased range of motion;Decreased activity tolerance;Decreased balance;Decreased mobility;Decreased knowledge of use of DME;Obesity;Pain  PT Treatment Interventions DME instruction;Gait training;Functional mobility training;Therapeutic activities;Therapeutic exercise;Patient/family education   PT Goals (Current goals can be found in the Care Plan section) Acute Rehab PT Goals Patient Stated Goal: Be able to walk PT Goal Formulation: With patient Time For Goal Achievement: 01/01/15 Potential to Achieve Goals: Fair    Frequency Min 3X/week   Barriers to discharge Decreased caregiver support      Co-evaluation               End of Session Equipment Utilized During Treatment: Left knee immobilizer Activity Tolerance: Patient limited by pain Patient left: in bed;with call bell/phone within reach Nurse Communication: Mobility status    Functional Assessment Tool Used: clinical judgement Functional Limitation: Mobility: Walking and moving around Mobility: Walking and Moving Around Current Status (Z6109): At least 60 percent but less than 80 percent impaired, limited or restricted Mobility: Walking and Moving Around Goal Status [phone]): At least 20 percent but less than 40 percent impaired, limited or restricted    Time: 0981-1914 PT Time Calculation (min) (ACUTE ONLY): 44 min   Charges:   PT Evaluation $Initial PT Evaluation Tier I: 1 Procedure PT Treatments $Therapeutic Activity: 8-22 mins   PT G Codes:   PT G-Codes **NOT FOR INPATIENT CLASS** Functional Assessment Tool Used: clinical judgement Functional Limitation: Mobility: Walking and moving around Mobility: Walking and Moving Around Current Status (N8295): At least 60 percent but less than 80 percent impaired, limited or restricted Mobility: Walking and Moving Around Goal Status [phone]): At least 20 percent but less than 40 percent impaired, limited or restricted    [name] 12/18/2014, 1:31  PM

## 2014-12-19 DIAGNOSIS — S86811D Strain of other muscle(s) and tendon(s) at lower leg level, right leg, subsequent encounter: Secondary | ICD-10-CM

## 2014-12-19 LAB — GLUCOSE, CAPILLARY
GLUCOSE-CAPILLARY: 104 mg/dL — AB (ref 70–99)
GLUCOSE-CAPILLARY: 113 mg/dL — AB (ref 70–99)
GLUCOSE-CAPILLARY: 123 mg/dL — AB (ref 70–99)
Glucose-Capillary: 131 mg/dL — ABNORMAL HIGH (ref 70–99)

## 2014-12-19 LAB — TROPONIN I
Troponin I: <0.03 ng/mL (ref <0.031)
Troponin I: <0.03 ng/mL (ref <0.031)
Troponin I: <0.03 ng/mL (ref <0.031)

## 2014-12-19 NOTE — Progress Notes (Signed)
Pt refused CPAP for tonight and prefers to wear 4Lpm nasal cannula SpO2 93%. RT will continue to monitor as needed.

## 2014-12-19 NOTE — Progress Notes (Signed)
PROGRESS NOTE  [name] ZOX:[id number] DOB: 01/16/1971 DOA: 12/16/2014 PCP: Emeterio ReeveWOLTERS,SHARON A, MD  From prior PN Patient presented with his  BG falling down he went to get some soda and fell on the steps hitting his Left knee.  Patient reports that he was recently on prednisone for his asthma and he was on NOvolog sliding scale now that his prednisone has finished he started to have lower blood sugars. Per EMS BG was down to 3 today. In ER palin films of the knee and hip were unremarkable but patient unable to bear weight.    Assessment/Plan: Left patellar tendon rupture - continue current pain control -[facility], unfortunately unable to get up with PT -continue knee immobilizer - Plan will be to transfer to [facility] next am.  -PT Eval- was ambulatory prior to fall - follow CBC  A-fib - continue amiodarone, metoprolol and Cardizem  -will need to stop Xarelto after todays dose and then use SQ lovenox till Sunday am, for Surgery Monday am  Asthma  -stable, continue albuterol as needed   OSA (obstructive sleep apnea) C Pap machine as per home parameters   DM -continue insulin 70/30 and SSI  Morbid obesity  DVT proph: on anticoagulation, see above  Code Status: full Family Communication: patient Disposition Plan: Tx to [facility] on Sunday for Surgery on Monday   Consultants:  ortho  Procedures: pending  HPI/Subjective: Patient states that pain is tolerable currently  Objective: Filed Vitals:   12/19/14 0646  BP: 150/84  Pulse: 94  Temp: 97.9 F (36.6 C)  Resp: 20    Intake/Output Summary (Last 24 hours) at 12/19/14 1117 Last data filed at 12/19/14 0903  Gross per 24 hour  Intake   1200 ml  Output   2350 ml  Net  -1150 ml   Filed Weights   12/16/14 1630 12/17/14 0005  Weight: 260.818 kg (575 lb) 258.55 kg (570 lb)    Exam:   General:  A+Ox3, NAD  Cardiovascular: s1 and s2 present, no rubs  Respiratory: diminished due to body  habitus, no wheezes  Abdomen: +BS, obese  Musculoskeletal: left knee in ACE wrap  Data Reviewed: Basic Metabolic Panel:  Recent Labs Lab 12/16/14 2159 12/17/14 0440  NA 144 145  K 4.0 3.8  CL 108 107  CO2  --  28  GLUCOSE 138* 161*  BUN 15 15  CREATININE 1.10 1.17  CALCIUM  --  8.5  MG  --  2.0  PHOS  --  4.0   Liver Function Tests:  Recent Labs Lab 12/17/14 0440  AST 17  ALT 12  ALKPHOS 43  BILITOT 0.9  PROT 6.0  ALBUMIN 3.2*   No results for input(s): LIPASE, AMYLASE in the last 168 hours. No results for input(s): AMMONIA in the last 168 hours. CBC:  Recent Labs Lab 12/16/14 2142 12/16/14 2159 12/17/14 0440  WBC 13.5*  --  10.2  HGB 13.5 15.0 12.1*  HCT 41.2 44.0 37.0*  MCV 83.7  --  84.1  PLT 207  --  179   Cardiac Enzymes:  Recent Labs Lab 12/18/14 2328 12/19/14 0320 12/19/14 0835  TROPONINI <0.03 <0.03 <0.03   BNP (last 3 results)  Recent Labs  11/26/14 1628  BNP 81.8    ProBNP (last 3 results)  Recent Labs  03/25/14 2110  PROBNP 33.8    CBG:  Recent Labs Lab 12/18/14 0742 12/18/14 1134 12/18/14 1651 12/18/14 2126 12/19/14 0747  GLUCAP 82  147* 156* 154* 131*    No results found for this or any previous visit (from the past 240 hour(s)).   Studies: Dg Knee 1-2 Views Left  01-14-15   CLINICAL DATA:  Patient status post fall. Low blood sugar. Initial encounter.  EXAM: LEFT KNEE - 1-2 VIEW  COMPARISON:  Radiographs 12/16/2014  FINDINGS: The patella is high-riding. There is soft tissue swelling the location of the patellar tendon. No definite acute fracture. Mild degenerative changes medial compartment with joint space narrowing and osteophytosis.  IMPRESSION: Findings suspicious for patellar tendon rupture.   Electronically Signed   By: Annia Belt M.D.   On: January 14, 2015 16:25   Dg Knee 1-2 Views Right  01-14-15   CLINICAL DATA:  Clinical suspicion for patella alta following a fall on the steps at [facility]  yesterday.  EXAM: RIGHT KNEE - 1-2 VIEW  COMPARISON:  None.  FINDINGS: The patella is in a normal location. No fracture, dislocation or effusion seen. Mild medial and lateral spur formation and mild medial and lateral joint space narrowing. Minimal posterior patellar spur formation.  IMPRESSION: Degenerative changes.  No acute abnormality.   Electronically Signed   By: Beckie Salts M.D.   On: 01/14/15 16:20    Scheduled Meds: . amiodarone  200 mg Oral Daily  . amLODipine  10 mg Oral Daily  . buPROPion  300 mg Oral Daily  . diltiazem  240 mg Oral Daily  . divalproex  1,000 mg Oral Daily  . docusate sodium  100 mg Oral BID  . enoxaparin (LOVENOX) injection  1 mg/kg Subcutaneous Q12H  . hydrALAZINE  10 mg Oral Daily  . insulin aspart  0-15 Units Subcutaneous TID WC  . insulin aspart  0-5 Units Subcutaneous QHS  . insulin aspart protamine- aspart  55 Units Subcutaneous BID WC  . lamoTRIgine  25 mg Oral Daily  . metoprolol succinate  50 mg Oral Daily  . senna  1 tablet Oral BID   Continuous Infusions:  Antibiotics Given (last 72 hours)    None      Active Problems:   Diabetes   Morbid obesity   CHF (congestive heart failure)   OSA (obstructive sleep apnea)   A-fib   Asthma   Left knee pain   Hypoglycemia   Patellar tendon rupture    Time spent: 25 min    Penny Pia  [facility] Pager [phone]. If 7PM-7AM, please contact night-coverage at [email or web address], password [facility] 12/19/2014, 11:17 AM  LOS: 1 day

## 2014-12-19 NOTE — Progress Notes (Signed)
Patient called out to state that he had chest tightness. Patient said that "I have tightness, not like last night". VS 123/67 95% on 2L and heart rate afib 62. MD notified. No new orders placed. Will continue to monitor patient.

## 2014-12-20 ENCOUNTER — Other Ambulatory Visit: Payer: Self-pay

## 2014-12-20 DIAGNOSIS — I482 Chronic atrial fibrillation: Secondary | ICD-10-CM

## 2014-12-20 LAB — GLUCOSE, CAPILLARY
Glucose-Capillary: 149 mg/dL — ABNORMAL HIGH (ref 70–99)
Glucose-Capillary: 126 mg/dL — ABNORMAL HIGH (ref 70–99)
Glucose-Capillary: 198 mg/dL — ABNORMAL HIGH (ref 70–99)
Glucose-Capillary: 145 mg/dL — ABNORMAL HIGH (ref 70–99)

## 2014-12-20 MED ORDER — DEXTROSE 5 % IV SOLN
3.0000 g | INTRAVENOUS | Status: AC
  Filled 2014-12-20: qty 3000

## 2014-12-20 MED ORDER — ACETAMINOPHEN 500 MG PO TABS
1000.0000 mg | ORAL_TABLET | Freq: Once | ORAL | Status: AC
  Administered 2014-12-21: 1000 mg via ORAL
  Filled 2014-12-20: qty 2

## 2014-12-20 MED ORDER — DEXTROSE-NACL 5-0.45 % IV SOLN
100.0000 mL/h | INTRAVENOUS | Status: DC
  Administered 2014-12-20: 100 mL/h via INTRAVENOUS
  Filled 2014-12-20: qty 1000

## 2014-12-20 NOTE — Progress Notes (Signed)
PROGRESS NOTE  [name]:[id number] DOB: [birth year] DOA: 12/16/2014 PCP: Emeterio Reeve, MD  From prior PN Patient presented with his  BG falling down he went to get some soda and fell on the steps hitting his Left knee.  Patient reports that he was recently on prednisone for his asthma and he was on NOvolog sliding scale now that his prednisone has finished he started to have lower blood sugars. Per EMS BG was down to 3 today. In ER palin films of the knee and hip were unremarkable but patient unable to bear weight.    Assessment/Plan: Left patellar tendon rupture - continue current pain control regimen - Plan for Surgery Monday at [facility], unfortunately unable to get up with PT - continue knee immobilizer - Plan will be to transfer to [facility] next am.  -PT Eval- was ambulatory prior to fall - follow CBC  A-fib - continue amiodarone, metoprolol and Cardizem  -Held Xarelto pt on SQ lovenox till Sunday am, for Surgery Monday am  Asthma  -stable, continue albuterol as needed   OSA (obstructive sleep apnea) C Pap machine as per home parameters   DM -continue insulin 70/30 and SSI  Morbid obesity  DVT proph: on anticoagulation, see above  Code Status: full Family Communication: patient Disposition Plan: Tx to [facility] for Surgery on Monday   Consultants:  ortho  Procedures: pending  HPI/Subjective: Patient states that pain is tolerable currently. No new complaints.  Objective: Filed Vitals:   12/20/14 0349  BP: 148/81  Pulse: 66  Temp: 97.7 F (36.5 C)  Resp: 18    Intake/Output Summary (Last 24 hours) at 12/20/14 1033 Last data filed at 12/20/14 0900  Gross per 24 hour  Intake   3280 ml  Output   2125 ml  Net   1155 ml   Filed Weights   12/16/14 1630 12/17/14 0005 12/20/14 0403  Weight: 260.818 kg (575 lb) 258.55 kg (570 lb) 267.622 kg (590 lb)    Exam:   General:  A+Ox3, NAD  Cardiovascular: s1 and s2 present, no rubs  Respiratory:  diminished due to body habitus, no wheezes  Abdomen: +BS, obese  Musculoskeletal: left knee in ACE wrap  Data Reviewed: Basic Metabolic Panel:  Recent Labs Lab 12/16/14 2159 12/17/14 0440  NA 144 145  K 4.0 3.8  CL 108 107  CO2  --  28  GLUCOSE 138* 161*  BUN 15 15  CREATININE 1.10 1.17  CALCIUM  --  8.5  MG  --  2.0  PHOS  --  4.0   Liver Function Tests:  Recent Labs Lab 12/17/14 0440  AST 17  ALT 12  ALKPHOS 43  BILITOT 0.9  PROT 6.0  ALBUMIN 3.2*   No results for input(s): LIPASE, AMYLASE in the last 168 hours. No results for input(s): AMMONIA in the last 168 hours. CBC:  Recent Labs Lab 12/16/14 2142 12/16/14 2159 12/17/14 0440  WBC 13.5*  --  10.2  HGB 13.5 15.0 12.1*  HCT 41.2 44.0 37.0*  MCV 83.7  --  84.1  PLT 207  --  179   Cardiac Enzymes:  Recent Labs Lab 12/18/14 2328 12/19/14 0320 12/19/14 0835  TROPONINI <0.03 <0.03 <0.03   BNP (last 3 results)  Recent Labs  11/26/14 1628  BNP 81.8    ProBNP (last 3 results)  Recent Labs  03/25/14 2110  PROBNP 33.8    CBG:  Recent Labs Lab 12/19/14 0747 12/19/14 1135 12/19/14 1627 12/19/14 2000 12/20/14  0741  GLUCAP 131* 104* 113* 123* 149*    No results found for this or any previous visit (from the past 240 hour(s)).   Studies: No results found.  Scheduled Meds: . amiodarone  200 mg Oral Daily  . amLODipine  10 mg Oral Daily  . buPROPion  300 mg Oral Daily  . diltiazem  240 mg Oral Daily  . divalproex  1,000 mg Oral Daily  . docusate sodium  100 mg Oral BID  . hydrALAZINE  10 mg Oral Daily  . insulin aspart  0-15 Units Subcutaneous TID WC  . insulin aspart  0-5 Units Subcutaneous QHS  . insulin aspart protamine- aspart  55 Units Subcutaneous BID WC  . lamoTRIgine  25 mg Oral Daily  . metoprolol succinate  50 mg Oral Daily  . senna  1 tablet Oral BID   Continuous Infusions:  Antibiotics Given (last 72 hours)    None      Active Problems:   Diabetes    Morbid obesity   CHF (congestive heart failure)   OSA (obstructive sleep apnea)   A-fib   Asthma   Left knee pain   Hypoglycemia   Patellar tendon rupture    Time spent: 25 min    Penny PiaVEGA, Amran Malter  [facility] Pager [phone]. If 7PM-7AM, please contact night-coverage at [email or web address], password [email or web address] 12/20/2014, 10:33 AM  LOS: 2 days

## 2014-12-20 NOTE — Progress Notes (Signed)
2025 [facility] in to transfer pt and all of his belongings.  Report given to Windell Mouldinguth RN at [facility].

## 2014-12-20 NOTE — Progress Notes (Signed)
Pt placed on CPAP via [facility] with 3 LPM O2 bleed in, current setting is 20 CMH20.  RT to monitor and assess as needed.

## 2014-12-21 ENCOUNTER — Encounter (HOSPITAL_COMMUNITY)
Admission: EM | Disposition: A | Discharge status: Skilled Nursing Facility | Payer: Self-pay | Source: Home / Self Care | Attending: Internal Medicine

## 2014-12-21 ENCOUNTER — Inpatient Hospital Stay (HOSPITAL_COMMUNITY): Payer: Medicare Other | Admitting: Certified Registered"

## 2014-12-21 ENCOUNTER — Encounter (HOSPITAL_COMMUNITY): Payer: Self-pay | Admitting: Anesthesiology

## 2014-12-21 DIAGNOSIS — E1165 Type 2 diabetes mellitus with hyperglycemia: Secondary | ICD-10-CM

## 2014-12-21 DIAGNOSIS — S86812D Strain of other muscle(s) and tendon(s) at lower leg level, left leg, subsequent encounter: Secondary | ICD-10-CM

## 2014-12-21 HISTORY — PX: PATELLAR TENDON REPAIR: SHX737

## 2014-12-21 LAB — GLUCOSE, CAPILLARY
GLUCOSE-CAPILLARY: 164 mg/dL — AB (ref 70–99)
GLUCOSE-CAPILLARY: 226 mg/dL — AB (ref 70–99)
Glucose-Capillary: 134 mg/dL — ABNORMAL HIGH (ref 70–99)
Glucose-Capillary: 158 mg/dL — ABNORMAL HIGH (ref 70–99)

## 2014-12-21 LAB — SURGICAL PCR SCREEN
MRSA, PCR: NEGATIVE
Staphylococcus aureus: NEGATIVE

## 2014-12-21 SURGERY — REPAIR, TENDON, PATELLAR
Anesthesia: General | Site: Knee | Laterality: Left

## 2014-12-21 MED ORDER — METHOCARBAMOL 500 MG PO TABS
500.0000 mg | ORAL_TABLET | Freq: Four times a day (QID) | ORAL | Status: DC | PRN
  Administered 2014-12-22 – 2014-12-23 (×4): 500 mg via ORAL
  Filled 2014-12-21 (×5): qty 1

## 2014-12-21 MED ORDER — METOCLOPRAMIDE HCL 10 MG PO TABS: 5.0000 mg | ORAL_TABLET | Freq: Three times a day (TID) | ORAL | Status: DC | PRN

## 2014-12-21 MED ORDER — ACETAMINOPHEN 325 MG PO TABS: 650.0000 mg | ORAL_TABLET | Freq: Four times a day (QID) | ORAL | Status: DC | PRN

## 2014-12-21 MED ORDER — 0.9 % SODIUM CHLORIDE (POUR BTL) OPTIME
TOPICAL | Status: DC | PRN
  Administered 2014-12-21: 1000 mL

## 2014-12-21 MED ORDER — ONDANSETRON HCL 4 MG/2ML IJ SOLN
INTRAMUSCULAR | Status: DC | PRN
  Administered 2014-12-21: 4 mg via INTRAVENOUS

## 2014-12-21 MED ORDER — LIDOCAINE HCL (CARDIAC) 20 MG/ML IV SOLN
INTRAVENOUS | Status: AC
  Filled 2014-12-21: qty 5

## 2014-12-21 MED ORDER — PROPOFOL 10 MG/ML IV BOLUS
INTRAVENOUS | Status: AC
  Filled 2014-12-21: qty 20

## 2014-12-21 MED ORDER — SUCCINYLCHOLINE CHLORIDE 20 MG/ML IJ SOLN
INTRAMUSCULAR | Status: DC | PRN
  Administered 2014-12-21: 160 mg via INTRAVENOUS

## 2014-12-21 MED ORDER — DEXMEDETOMIDINE HCL 200 MCG/2ML IV SOLN
INTRAVENOUS | Status: DC | PRN
  Administered 2014-12-21 (×4): 8 ug via INTRAVENOUS

## 2014-12-21 MED ORDER — METHOCARBAMOL 1000 MG/10ML IJ SOLN
500.0000 mg | Freq: Four times a day (QID) | INTRAVENOUS | Status: DC | PRN
  Filled 2014-12-21: qty 5

## 2014-12-21 MED ORDER — METHOCARBAMOL 500 MG PO TABS: 500.0000 mg | ORAL_TABLET | Freq: Four times a day (QID) | ORAL | Status: DC

## 2014-12-21 MED ORDER — FENTANYL CITRATE 0.05 MG/ML IJ SOLN
INTRAMUSCULAR | Status: DC | PRN
  Administered 2014-12-21: 100 ug via INTRAVENOUS

## 2014-12-21 MED ORDER — LIDOCAINE HCL (CARDIAC) 20 MG/ML IV SOLN
INTRAVENOUS | Status: DC | PRN
  Administered 2014-12-21: 50 mg via INTRAVENOUS

## 2014-12-21 MED ORDER — HYDROMORPHONE HCL 1 MG/ML IJ SOLN
1.0000 mg | INTRAMUSCULAR | Status: DC | PRN
  Administered 2014-12-21 – 2014-12-22 (×7): 1 mg via INTRAVENOUS
  Filled 2014-12-21 (×7): qty 1

## 2014-12-21 MED ORDER — ONDANSETRON HCL 4 MG/2ML IJ SOLN
INTRAMUSCULAR | Status: AC
  Filled 2014-12-21: qty 2

## 2014-12-21 MED ORDER — OXYCODONE-ACETAMINOPHEN 5-325 MG PO TABS: 1.0000 | ORAL_TABLET | ORAL | Status: DC | PRN

## 2014-12-21 MED ORDER — LACTATED RINGERS IV SOLN
INTRAVENOUS | Status: DC
  Administered 2014-12-21: 13:00:00 via INTRAVENOUS

## 2014-12-21 MED ORDER — PROPOFOL 10 MG/ML IV BOLUS
INTRAVENOUS | Status: DC | PRN
  Administered 2014-12-21: 250 mg via INTRAVENOUS

## 2014-12-21 MED ORDER — METOCLOPRAMIDE HCL 5 MG/ML IJ SOLN: 5.0000 mg | Freq: Three times a day (TID) | INTRAMUSCULAR | Status: DC | PRN

## 2014-12-21 MED ORDER — OXYCODONE HCL 5 MG PO TABS
5.0000 mg | ORAL_TABLET | ORAL | Status: DC | PRN
  Administered 2014-12-21 – 2014-12-22 (×3): 10 mg via ORAL
  Filled 2014-12-21 (×3): qty 2

## 2014-12-21 MED ORDER — ACETAMINOPHEN 650 MG RE SUPP: 650.0000 mg | Freq: Four times a day (QID) | RECTAL | Status: DC | PRN

## 2014-12-21 MED ORDER — DEXTROSE 5 % IV SOLN
3.0000 g | INTRAVENOUS | Status: AC
  Administered 2014-12-21: 3 g via INTRAVENOUS
  Filled 2014-12-21: qty 3000

## 2014-12-21 MED ORDER — ACETAMINOPHEN 10 MG/ML IV SOLN
INTRAVENOUS | Status: DC | PRN
  Administered 2014-12-21: 1000 mg via INTRAVENOUS

## 2014-12-21 MED ORDER — SUCCINYLCHOLINE CHLORIDE 20 MG/ML IJ SOLN
INTRAMUSCULAR | Status: AC
  Filled 2014-12-21: qty 1

## 2014-12-21 MED ORDER — FENTANYL CITRATE 0.05 MG/ML IJ SOLN
INTRAMUSCULAR | Status: AC
Start: 2014-12-21 — End: 2014-12-21
  Filled 2014-12-21: qty 5

## 2014-12-21 MED ORDER — ONDANSETRON HCL 4 MG/2ML IJ SOLN: 4.0000 mg | Freq: Four times a day (QID) | INTRAMUSCULAR | Status: DC | PRN

## 2014-12-21 MED ORDER — PROMETHAZINE HCL 25 MG/ML IJ SOLN: 6.2500 mg | INTRAMUSCULAR | Status: DC | PRN

## 2014-12-21 MED ORDER — ONDANSETRON HCL 4 MG PO TABS: 4.0000 mg | ORAL_TABLET | Freq: Four times a day (QID) | ORAL | Status: DC | PRN

## 2014-12-21 MED ORDER — MIDAZOLAM HCL 2 MG/2ML IJ SOLN
INTRAMUSCULAR | Status: AC
  Filled 2014-12-21: qty 2

## 2014-12-21 MED ORDER — LACTATED RINGERS IV SOLN
INTRAVENOUS | Status: DC | PRN
  Administered 2014-12-21: 13:00:00 via INTRAVENOUS

## 2014-12-21 MED ORDER — ACETAMINOPHEN 10 MG/ML IV SOLN
INTRAVENOUS | Status: AC
  Filled 2014-12-21: qty 100

## 2014-12-21 MED ORDER — FENTANYL CITRATE 0.05 MG/ML IJ SOLN: 25.0000 ug | INTRAMUSCULAR | Status: DC | PRN

## 2014-12-21 MED ORDER — MEPERIDINE HCL 25 MG/ML IJ SOLN: 6.2500 mg | INTRAMUSCULAR | Status: DC | PRN

## 2014-12-21 MED ORDER — PHENYLEPHRINE HCL 10 MG/ML IJ SOLN
10.0000 mg | INTRAVENOUS | Status: DC | PRN
  Administered 2014-12-21: 20 ug/min via INTRAVENOUS

## 2014-12-21 MED ORDER — CEFAZOLIN SODIUM-DEXTROSE 2-3 GM-% IV SOLR
2.0000 g | Freq: Four times a day (QID) | INTRAVENOUS | Status: AC
  Administered 2014-12-21 – 2014-12-22 (×3): 2 g via INTRAVENOUS
  Filled 2014-12-21 (×3): qty 50

## 2014-12-21 MED ORDER — KETOROLAC TROMETHAMINE 30 MG/ML IJ SOLN
INTRAMUSCULAR | Status: DC | PRN
  Administered 2014-12-21: 30 mg via INTRAVENOUS

## 2014-12-21 MED ORDER — DOCUSATE SODIUM 100 MG PO CAPS
100.0000 mg | ORAL_CAPSULE | Freq: Two times a day (BID) | ORAL | Status: DC
  Administered 2014-12-21 – 2014-12-23 (×4): 100 mg via ORAL
  Filled 2014-12-21 (×4): qty 1

## 2014-12-21 MED ORDER — BUPIVACAINE HCL 0.5 % IJ SOLN
INTRAMUSCULAR | Status: DC | PRN
  Administered 2014-12-21: 30 mL

## 2014-12-21 MED ORDER — DIPHENHYDRAMINE HCL 12.5 MG/5ML PO ELIX: 12.5000 mg | ORAL_SOLUTION | ORAL | Status: DC | PRN

## 2014-12-21 SURGICAL SUPPLY — 52 items
BANDAGE ESMARK 6X9 LF (GAUZE/BANDAGES/DRESSINGS) ×1 IMPLANT
BNDG COHESIVE 6X5 TAN STRL LF (GAUZE/BANDAGES/DRESSINGS) ×3 IMPLANT
BNDG ELASTIC 6X15 VLCR STRL LF (GAUZE/BANDAGES/DRESSINGS) ×3 IMPLANT
BNDG ESMARK 6X9 LF (GAUZE/BANDAGES/DRESSINGS) ×3
CLOSURE STERI-STRIP 1/2X4 (GAUZE/BANDAGES/DRESSINGS) ×2
CLOSURE WOUND 1/2 X4 (GAUZE/BANDAGES/DRESSINGS) ×1
CLSR STERI-STRIP ANTIMIC 1/2X4 (GAUZE/BANDAGES/DRESSINGS) ×4 IMPLANT
COVER SURGICAL LIGHT HANDLE (MISCELLANEOUS) ×3 IMPLANT
DRAPE IMP U-DRAPE 54X76 (DRAPES) ×6 IMPLANT
DRAPE U-SHAPE 47X51 STRL (DRAPES) ×3 IMPLANT
DRSG EMULSION OIL 3X3 NADH (GAUZE/BANDAGES/DRESSINGS) ×3 IMPLANT
DRSG MEPILEX BORDER 4X12 (GAUZE/BANDAGES/DRESSINGS) ×3 IMPLANT
ELECT REM PT RETURN 9FT ADLT (ELECTROSURGICAL) ×3
ELECTRODE REM PT RTRN 9FT ADLT (ELECTROSURGICAL) ×1 IMPLANT
GAUZE SPONGE 4X4 12PLY STRL (GAUZE/BANDAGES/DRESSINGS) ×3 IMPLANT
GLOVE BIO SURGEON STRL SZ7.5 (GLOVE) ×3 IMPLANT
GLOVE BIO SURGEON STRL SZ8 (GLOVE) ×3 IMPLANT
GLOVE BIOGEL PI IND STRL 8 (GLOVE) ×1 IMPLANT
GLOVE BIOGEL PI INDICATOR 8 (GLOVE) ×2
GLOVE ECLIPSE 7.0 STRL STRAW (GLOVE) ×3 IMPLANT
GLOVE SURG SS PI 6.5 STRL IVOR (GLOVE) ×3 IMPLANT
GOWN STRL REUS W/ TWL LRG LVL3 (GOWN DISPOSABLE) ×1 IMPLANT
GOWN STRL REUS W/ TWL XL LVL3 (GOWN DISPOSABLE) ×3 IMPLANT
GOWN STRL REUS W/TWL LRG LVL3 (GOWN DISPOSABLE) ×2
GOWN STRL REUS W/TWL XL LVL3 (GOWN DISPOSABLE) ×6
IMMOBILIZER KNEE 22 (SOFTGOODS) ×3 IMPLANT
IMMOBILIZER KNEE 22  40 CIR (ORTHOPEDIC SUPPLIES) ×2
IMMOBILIZER KNEE 22 40 CIR (ORTHOPEDIC SUPPLIES) ×1 IMPLANT
KIT BASIN OR (CUSTOM PROCEDURE TRAY) ×3 IMPLANT
KIT ROOM TURNOVER OR (KITS) ×3 IMPLANT
NS IRRIG 1000ML POUR BTL (IV SOLUTION) ×3 IMPLANT
PACK ORTHO EXTREMITY (CUSTOM PROCEDURE TRAY) ×3 IMPLANT
PAD ARMBOARD 7.5X6 YLW CONV (MISCELLANEOUS) ×6 IMPLANT
RETRIEVER SUT HEWSON (MISCELLANEOUS) ×3 IMPLANT
SPONGE LAP 18X18 X RAY DECT (DISPOSABLE) ×3 IMPLANT
STOCKINETTE IMPERVIOUS LG (DRAPES) ×3 IMPLANT
STRIP CLOSURE SKIN 1/2X4 (GAUZE/BANDAGES/DRESSINGS) ×2 IMPLANT
SUT FIBERWIRE #2 38 T-5 BLUE (SUTURE)
SUT MNCRL AB 4-0 PS2 18 (SUTURE) IMPLANT
SUT MON AB 2-0 CT1 27 (SUTURE) IMPLANT
SUT MON AB 2-0 CT1 36 (SUTURE) ×6 IMPLANT
SUT PDS AB 1 CTX 36 (SUTURE) ×3 IMPLANT
SUT VIC AB 0 CT1 27 (SUTURE) ×2
SUT VIC AB 0 CT1 27XBRD ANBCTR (SUTURE) ×1 IMPLANT
SUTURE FIBERWR #2 38 T-5 BLUE (SUTURE) IMPLANT
SYR 20CC LL (SYRINGE) IMPLANT
TAPE STRIPS DRAPE STRL (GAUZE/BANDAGES/DRESSINGS) ×3 IMPLANT
TUBE CONNECTING 12'X1/4 (SUCTIONS) ×1
TUBE CONNECTING 12X1/4 (SUCTIONS) ×2 IMPLANT
UNDERPAD 30X30 INCONTINENT (UNDERPADS AND DIAPERS) ×3 IMPLANT
WATER STERILE IRR 1000ML POUR (IV SOLUTION) ×3 IMPLANT
YANKAUER SUCT BULB TIP NO VENT (SUCTIONS) ×3 IMPLANT

## 2014-12-21 NOTE — OR Nursing (Signed)
Patient remained on inpatient bed for procedure per Dr.Tim Eulah PontMurphy.

## 2014-12-21 NOTE — Discharge Instructions (Signed)
Keep dressing clean and dry till follow up  Weight bearing as tolerated in the left leg while in the immobilizer at all times   Emergency Department Resource Guide 1) Find a Doctor and Pay Out of Pocket Although you won't have to find out who is covered by your insurance plan, it is a good idea to ask around and get recommendations. You will then need to call the office and see if the doctor you have chosen will accept you as a new patient and what types of options they offer for patients who are self-pay. Some doctors offer discounts or will set up payment plans for their patients who do not have insurance, but you will need to ask so you aren't surprised when you get to your appointment.  2) Contact Your Local Health Department Not all health departments have doctors that can see patients for sick visits, but many do, so it is worth a call to see if yours does. If you don't know where your local health department is, you can check in your phone book. The CDC also has a tool to help you locate your state's health department, and many state websites also have listings of all of their local health departments.  3) Find a Walk-in Clinic If your illness is not likely to be very severe or complicated, you may want to try a walk in clinic. These are popping up all over the country in pharmacies, drugstores, and shopping centers. They're usually staffed by nurse practitioners or physician assistants that have been trained to treat common illnesses and complaints. They're usually fairly quick and inexpensive. However, if you have serious medical issues or chronic medical problems, these are probably not your best option.  No Primary Care Doctor: - Call Health Connect at  [phone] - they can help you locate a primary care doctor that  accepts your insurance, provides certain services, etc. - Physician Referral Service- [phone]  Chronic Pain Problems: Organization         Address  Phone    Notes  [facility]  [phone] Patients need to be referred by their primary care doctor.   Medication Assistance: Organization         Address  Phone   Notes  [facility] [address] [place], [place] [address] [phone] --Must be a resident of [place] -- Must have NO insurance coverage whatsoever (no Medicaid/ Medicare, etc.) -- The pt. MUST have a primary care doctor that directs their care regularly and follows them in the community   [facility]ist  [phone]   [employer]  [phone]    Agencies that provide inexpensive medical care: Organization         Address  Phone   Notes  [facility]  [phone]   [facility]    [phone]   [facility] [address] [place], [place] [address] [phone]   [facility] [address], [place] [phone]   [facility]anned Parenthood    [phone]   [facility]    [phone]   [facility]  [address], [place] Phone:  [phone], Fax:  [phone]) [phone] Hours of Operation:  9 am - 6 pm, M-F.  Also accepts Medicaid/Medicare and self-pay.  [facility]  [address], [place] Phone: (  Phone: [phone], Fax: [phone]. Hours of Operation:  8:30 am - 5:30 pm, M-F.  Also accepts Medicaid and self-pay.  °[facility] [place] [address], [place] Phone: [phone]   °[facility] [address], [place], [place] [phone], Ext. [phone] Mondays & Thursdays: 7-9 AM.  First 15 patients are seen on a first come, first serve basis. °  ° °Medicaid-accepting [place] Providers: ° °Organization         Address  Phone   Notes  °[facility] [address], [place] [phone] Also accepts self-pay patients.  °[facility]  [address], [place] ° [phone]   °[facility] [address], [place] [phone]   °[facility] [address] [place] [address], [place] [phone]   °Veita Bland [address], [place]  ° [phone] Only accepts [place] Access Medicaid patients after they have their name applied to their card.  ° °Self-Pay (no insurance) in [place]: ° °Organization         Address  Phone   Notes  °Sickle Cell Patients, [facility] [address], [place] [phone]   °[facility] [address], [place] [phone]   °[facility] ° [address], [place] [phone]   °[facility]/Dr. Osei-Bonsu ° [address] [place] [address], [place] or [address], [place] [phone] Phone number for both [place] and [place] locations is the same.  °[facility] [address], [place] [phone]   °[facility] [address] [place] [address], [place] or [address] [phone] °([phone]   °[facility] [address], [place] [phone], phone; [phone], fax Sees patients 1st and 3rd Saturday of every month.  Must not qualify for public or private insurance (i.e. Medicaid, Medicare, [place] Health Choice, Veterans' Benefits) • Household income should be no more than 200% of the poverty level •The clinic cannot treat you if you are pregnant or think you are pregnant • Sexually transmitted diseases are not treated at the clinic.  ° ° °Dental Care: °Organization         Address  Phone  Notes  °[place] [facility] [address], [place] [phone] Accepts children up to age 21 who are enrolled in Medicaid or [place] Health Choice; pregnant women with a Medicaid card; and children who have  applied for Medicaid or [place] Health Choice, but were declined, whose parents can pay a reduced fee at time of service.  °[place] [facility] [place]  [address], [place] [phone] Accepts children up to age 21 who are enrolled in Medicaid or [place] Health Choice; pregnant women with a Medicaid card; and children who have applied for Medicaid or [place] Health Choice, but were declined, whose parents can pay a reduced fee at time of service.  °[facility] ° [address],  [phone] Patients are seen by appointment only. Walk-ins are not accepted. [facility] will see patients 18 years of age and older. °Monday - Tuesday (8am-5pm) °Most Wednesdays (8:30-5pm) °$30 per visit, cash only  °[facility] ° [address]   [place] [phone] Patients are seen by appointment only. Walk-ins are not accepted. [place] will see patients 9 years of age and older. One Wednesday Evening (Monthly: Volunteer Based).  $30 per visit, cash only  [place]  [phone] for adults; Children under age 35, call Graduate Pediatric Dentistry at [phone]. Children aged 56-14, please call [phone] to request a pediatric application.  Dental services are provided in all areas of dental care including fillings, crowns and bridges, complete and partial dentures, implants, gum treatment, root canals, and extractions. Preventive care is also provided. Treatment is provided to both adults and children. Patients are selected via a lottery and there is often a waiting list.   [facility] [address] [place]  [phone] [email or web address]   [facility] [address] [place], [place] [phone], Ext. [phone] Second and Fourth Thursday of each month, opens at 6:30 AM; Clinic ends at 9 AM.  Patients are seen on a first-come first-served basis, and a  limited number are seen during each clinic.   [facility]  [address] [place], [place] [phone]   Eligibility Requirements You must have lived in [place], [place], or [place] counties for at least the last three months.   You cannot be eligible for state or federal sponsored [employer], including [employer], [place], or [employer].   You generally cannot be eligible for healthcare insurance through your employer.    How to apply: Eligibility screenings are held every Tuesday and Wednesday afternoon from 1:00 pm until 4:00 pm. You do not need an appointment for the interview!  [facility] [address] [place], [place]   [place]  [place] Department  [place]  [phone]    Behavioral Health Resources in the Community: Intensive Outpatient Programs Organization         Address  Phone  Notes  [place] [address], [place], [place] [phone]   [facility] [address], [place], [place]   [facility] [address] [place], [place]   [place] [address],  [place], [place] or [phone]   Substance Abuse Resources Organization         Address  Phone  Notes  [facility]  9703762064   [place]  [phone]   [facility] [place]   Chinita Pester  [phone]   Residential & Outpatient Substance Abuse Program  [phone]   Psychological Services Organization         Address  Phone  Notes  [facility] [place]  [place]  [phone]   [place] [address] [place] or [phone]    Mobile Crisis Teams Organization          Address  Phone  Notes  [facility], Mobile Crisis Care Unit  [phone]   [facility]  [address] [place], [place]   [place] [address] [place] [phone]    Self-Help/Support Groups Organization         Address  Phone             Notes  [place]. of [place] - variety of support  groups  [phone]- [phone] Call for more information  Narcotics Anonymous (NA), [facility] [address], [employer] [place]  2 meetings at this location   Residential Sports administrator         Address  Phone  Notes  [facility] [address],    [place]  [phone]   [facility]  [address] [place] [address], [place], [place] [phone]   [facility] [address] [place], [place] [phone] Admissions: 8am-3pm M-F  [facility] [address],    [place], [place] [phone]   [facility] [address] [place], [place], [place] [phone]   [facility] [address],  [place], [place] [phone]   [facility] - Intensive Outpatient [address], [place], [place] [phone]   [facility] ([facility] [address] [place].,  [place], [place] [phone] or [phone]   [facility] ([facility]) [address] [place], [place] [phone] Accepts Medicaid  [facility] [address]  [place] [phone] Substance Abuse/Addiction Treatment   [facility] Organization         Address  Phone  Notes  [facility]  [phone]   Angie Fava, PhD [address] [place]vin Kn[place], [place]   [phone] or [phone]   [facility]   [address] [place], [place] [phone]   [facility] [address], [place], [place] [phone] Insurance/Medicaid/sponsorship  through [facility] and Families [address]                                    [place], [place] [phone] Therapy/tele-psych/case  [facility] [address], [place] [phone]    Dr. Lolly Mustache  [phone]   [facility]  United [employer] [facility] Dept. 1) [address], [place] 2) [address], [place] 3)  [address], [place] [phone]  [phone]   [facility] Child Abuse Hotline 260-036-5133 or [phone])[phone] (After Hours)      Take the prescription as directed.  Apply moist heat or ice to the area(s) of discomfort, for 15 minutes at a time, several times per day for the next few days.  Do not fall asleep on a heating or ice pack. Wear the knee brace and walk with your walker until you are seen in follow up. Call the Orthopedic doctor tomorrow morning to schedule a follow up appointment in the next 2 days.  Return to the Emergency Department immediately if worsening.

## 2014-12-21 NOTE — Transfer of Care (Signed)
Immediate Anesthesia Transfer of Care Note  Patient: [name]  Procedure(s) Performed: Procedure(s): PATELLA TENDON REPAIR (Left)  Patient Location: PACU  Anesthesia Type:General  Level of Consciousness: awake, alert , oriented and patient cooperative  Airway & Oxygen Therapy: Patient Spontanous Breathing and Patient connected to face mask oxygen  Post-op Assessment: Report given to RN, Post -op Vital signs reviewed and stable and Patient moving all extremities  Post vital signs: Reviewed and stable  Last Vitals:  Filed Vitals:   12/21/14 1209  BP: 131/74  Pulse: 61  Temp: 36.6 C  Resp: 18    Complications: No apparent anesthesia complications

## 2014-12-21 NOTE — Op Note (Addendum)
12/16/2014 - 12/21/2014  2:59 PM  PATIENT:  [name]    PRE-OPERATIVE DIAGNOSIS:  Left patella tendon rupture  POST-OPERATIVE DIAGNOSIS:  Same  PROCEDURE:  PATELLA TENDON REPAIR  SURGEON:  Tatum Corl, Jewel BaizeIMOTHY D, MD  ASSISTANT: Janalee DaneBrittney Kelly, PA-C, She was present and scrubbed throughout the case, critical for completion in a timely fashion, and for retraction, instrumentation, and closure.   ANESTHESIA:   gen  PREOPERATIVE INDICATIONS:  [name] is a  44 y.o. male with a diagnosis of Left patella tendon rupture who elected for surgical management.    The risks benefits and alternatives were discussed with the patient preoperatively including but not limited to the risks of infection, bleeding, nerve injury, cardiopulmonary complications, the need for revision surgery, among others, and the patient was willing to proceed.  OPERATIVE FINDINGS: complete rupture  BLOOD LOSS: min  TOURNIQUET TIME: none  OPERATIVE PROCEDURE:  Patient was identified in the preoperative holding area and site was marked by me He was transported to the operating theater and placed on the table in supine position taking care to pad all bony prominences. After a preincinduction time out anesthesia was induced. The left lower extremity was prepped and draped in normal sterile fashion and a pre-incision timeout was performed. He received ancef for preoperative antibiotics.   He is greater than 500lbs, this caused increased difficulty with positioning, approach, closure and manipulation intra-op.  I made an incision directly over his traumatic injury. I dissected down to the level of the peritenon and elevated skin flaps over top of this there were full-thickness.  I then incised the peritenon it was partially ruptured as well. Identified his rupture tendon.  I debrided tendon from the patella allowing a good bony bed for healing.  I then used 2 #2 FiberWire as a whipstitched up and back in the Tendon  leaving me with 4 total strands coming out.  I thoroughly irrigated the joint  Next I used a drill bit to drill 3 holes in the patella taking care to not penetrate the articular surface. I used a [place] suture passer to pass all 4 stitches through these holes.  The patella reapproximated well to the tendon. I tied the stitches over top of the bone bridge of the patella.  I then stressed the repair and it was stable to 80 degrees.  I then thoroughly irrigated the wound again.  I closed the ruptured peritenon as well as the surgically incised peritenon with an 0 Vicryl. Then closed the skin with a monocryl stitch  Sterile dressing was applied the knee was placed in a knee immobilizer and taken the PACU in stable condition.  POST OPERATIVE PLAN: WBAT in immobilizer, DVT px: early ambulation and chemical px.     This note was generated using a template and dragon dictation system. In light of that, I have reviewed the note and all aspects of it are applicable to this case. Any dictation errors are due to the computerized dictation system.

## 2014-12-21 NOTE — Anesthesia Postprocedure Evaluation (Signed)
  Anesthesia Post-op Note  Patient: [name]  Procedure(s) Performed: Procedure(s): PATELLA TENDON REPAIR (Left)  Patient Location:    Anesthesia Type:General  Level of Consciousness: awake and alert   Airway and Oxygen Therapy: Patient Spontanous Breathing and Patient connected to nasal cannula oxygen  Post-op Pain: mild  Post-op Assessment: Post-op Vital signs reviewed, Patient's Cardiovascular Status Stable, Respiratory Function Stable and Patent Airway  Post-op Vital Signs: Reviewed and stable  Last Vitals:  Filed Vitals:   12/21/14 1209  BP: 131/74  Pulse: 61  Temp: 36.6 C  Resp: 18    Complications: No apparent anesthesia complications

## 2014-12-21 NOTE — Anesthesia Procedure Notes (Signed)
Procedure Name: Intubation Date/Time: 12/21/2014 1:24 PM Performed by: Orlinda BlalockMCMILLEN, Yuleni Burich L Pre-anesthesia Checklist: Patient identified, Emergency Drugs available, Suction available, Patient being monitored and Timeout performed Patient Re-evaluated:Patient Re-evaluated prior to inductionOxygen Delivery Method: Circle system utilized Preoxygenation: Pre-oxygenation with 100% oxygen Intubation Type: IV induction and Cricoid Pressure applied Ventilation: Oral airway inserted - appropriate to patient size and Mask ventilation with difficulty Laryngoscope Size: Mac and 4 Grade View: Grade I Tube type: Oral Tube size: 8.0 mm Number of attempts: 1 Airway Equipment and Method: Stylet Placement Confirmation: ETT inserted through vocal cords under direct vision,  positive ETCO2 and breath sounds checked- equal and bilateral Secured at: 23 cm Tube secured with: Tape Dental Injury: Teeth and Oropharynx as per pre-operative assessment  Difficulty Due To: Difficulty was anticipated, Difficult Airway- due to large tongue and Difficult Airway- due to reduced neck mobility Comments: Pt morbidly obese grade 1 view with retraction on chest and crichoid pressure

## 2014-12-21 NOTE — Interval H&P Note (Signed)
History and Physical Interval Note:  12/21/2014 8:22 AM  Maurice MarchPaul J Colford  has presented today for surgery, with the diagnosis of Left patella tendon rupture  The various methods of treatment have been discussed with the patient and family. After consideration of risks, benefits and other options for treatment, the patient has consented to  Procedure(s): PATELLA TENDON REPAIR (Left) as a surgical intervention .  The patient's history has been reviewed, patient examined, no change in status, stable for surgery.  I have reviewed the patient's chart and labs.  Questions were answered to the patient's satisfaction.     Jaleigha Deane D

## 2014-12-21 NOTE — Anesthesia Preprocedure Evaluation (Addendum)
Anesthesia Evaluation  Patient identified by MRN, date of birth, ID band Patient awake    Reviewed: Allergy & Precautions, NPO status , Patient's Chart, lab work & pertinent test results  Airway Mallampati: III   Neck ROM: Full    Dental  (+) Teeth Intact, Dental Advisory Given   Pulmonary asthma , sleep apnea, Continuous Positive Airway Pressure Ventilation and Oxygen sleep apnea ,          Cardiovascular hypertension, Pt. 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    GI/Hepatic negative GI ROS, Neg liver ROS,   Endo/Other  diabetes, Type 2, Insulin Dependent  Renal/GU GFR 76     Musculoskeletal   Abdominal (+) + obese,   Peds  Hematology   Anesthesia Other Findings Morbid obesity, large panus  Reproductive/Obstetrics                           Anesthesia Physical Anesthesia Plan  ASA: IV  Anesthesia Plan: General   Post-op Pain Management:    Induction: Intravenous  Airway Management Planned: Oral ETT and Video Laryngoscope Planned  Additional Equipment:   Intra-op Plan:   Post-operative Plan:   Informed Consent: I have reviewed the patients History and Physical, chart, labs and discussed the procedure including the risks, benefits and alternatives for the proposed anesthesia with the patient or authorized representative who has indicated his/her understanding and acceptance.     Plan Discussed with:   Anesthesia Plan Comments: (Multimodal pain RX, [place])        Anesthesia Quick Evaluation

## 2014-12-21 NOTE — Progress Notes (Addendum)
PROGRESS NOTE    [name] ZOX:[id number] DOB: 03/26/1971 DOA: 12/16/2014 PCP: Emeterio ReeveWOLTERS,SHARON A, MD  HPI/Brief narrative 44 year old male with history of DM 2/IDDM, atrial fibrillation on Xarelto, chronic respiratory failure on home oxygen 2 L/m, OSA/OHS on nightly C Pap, morbid obesity, essential hypertension, chronic diastolic CHF, asthma, recently treated for asthma exacerbation with prednisone and newly started on NovoLog SSI, [facility] 12/16/14 after a fall when he went to get some soda. He felt like he was hypoglycemic. Patient states that his CBGs were in the mid 30s in ED. Diagnosed with left patellar tendon rupture and unable to bear weight. Hence [facility] for [facility] fixation by orthopedics on 3/21.   Assessment/Plan:  1. Left patellar tendon rupture: Sustained status post mechanical fall. Patient unable to get up with PT. Orthopedics plans surgical correction 3/21. Patient is at moderate risk for perioperative CV adverse events. Overall CV status however appears stable and patient may proceed with indicated surgery with close perioperative monitoring without any further workup. 2. PAF with controlled ventricular rate: EKG 12/20/14 without acute changes. Continue amiodarone, diltiazem and metoprolol. Xarelto on hold for surgery. Resume postoperatively when cleared by orthopedics. 3. Asthma: Stable. 4. Chronic hypoxic respiratory failure: On home oxygen 2 L/m. Continue. 5. OSA/OHS on nightly C Pap: Continue C Pap. 6. Uncontrolled type II DM: Recent hypoglycemia probably related to insulins. Hypoglycemia resolved. Continue 70/30 insulin and SSI. Monitor closely while nothing by mouth. On D5 IV infusion. AM 70/30 insulin held for surgery this afternoon. 7. Essential hypertension: Controlled on amlodipine, hydralazine, metoprolol and diltiazem. 8. Morbid obesity: 9. Chronic diastolic CHF: Compensated 10. Atypical chest pain: Had chest pain on  night of 3/18. Resolved without recurrence. EKG without acute changes. Troponin 3 negative.? GI etiology from GERD. Monitor. Previous episodes of recurrent atypical chest pain. As per primary cardiologist note 06/19/13: too large for cath, CT, nuclear study images is not adequate for dobutamine echo)  Code Status: Full Family Communication: None at bedside Disposition Plan: To be determined postoperatively.   Consultants:  Orthopedics  Procedures:  None  Antibiotics:  None   Subjective: Expected left knee pain. Denies dyspnea or chest pain.  Objective: Filed Vitals:   12/21/14 0239 12/21/14 0431 12/21/14 0607 12/21/14 0700  BP:      Pulse: 85     Temp:      TempSrc:      Resp: 20 20 20    Height:      Weight:    276.694 kg (610 lb)  SpO2: 95%      BP: 140/68 mmHg, temperature: 97.32F,  Intake/Output Summary (Last 24 hours) at 12/21/14 1017 Last data filed at 12/21/14 0600  Gross per 24 hour  Intake 2241.67 ml  Output   3000 ml  Net -758.33 ml   Filed Weights   12/17/14 0005 12/20/14 0403 12/21/14 0700  Weight: 258.55 kg (570 lb) 267.622 kg (590 lb) 276.694 kg (610 lb)     Exam:  General exam: Moderately built and morbidly obese male lying comfortably supine and slightly in left lateral position without distress. Respiratory system: Clear. No increased work of breathing. Cardiovascular system: S1 & S2 heard, irregularly irregular. No JVD, murmurs, gallops, clicks. Trace bilateral ankle edema. Gastrointestinal system: Abdomen is obese + + +/ nondistended, soft and nontender. Normal bowel sounds heard. Central nervous system: Alert and oriented. No focal neurological deficits. Extremities: Symmetric 5 x 5 power. Painful range of movements in left knee.  Data Reviewed: Basic Metabolic Panel:  Recent Labs Lab 12/16/14 2159 12/17/14 0440  NA 144 145  K 4.0 3.8  CL 108 107  CO2  --  28  GLUCOSE 138* 161*  BUN 15 15  CREATININE 1.10 1.17  CALCIUM  --   8.5  MG  --  2.0  PHOS  --  4.0   Liver Function Tests:  Recent Labs Lab 12/17/14 0440  AST 17  ALT 12  ALKPHOS 43  BILITOT 0.9  PROT 6.0  ALBUMIN 3.2*   No results for input(s): LIPASE, AMYLASE in the last 168 hours. No results for input(s): AMMONIA in the last 168 hours. CBC:  Recent Labs Lab 12/16/14 2142 12/16/14 2159 12/17/14 0440  WBC 13.5*  --  10.2  HGB 13.5 15.0 12.1*  HCT 41.2 44.0 37.0*  MCV 83.7  --  84.1  PLT 207  --  179   Cardiac Enzymes:  Recent Labs Lab 12/18/14 2328 12/19/14 0320 12/19/14 0835  TROPONINI <0.03 <0.03 <0.03   BNP (last 3 results)  Recent Labs  03/25/14 2110  PROBNP 33.8   CBG:  Recent Labs Lab 12/20/14 0741 12/20/14 1158 12/20/14 1640 12/20/14 2158 12/21/14 0657  GLUCAP 149* 126* 198* 145* 164*    Recent Results (from the past 240 hour(s))  Surgical pcr screen     Status: None   Collection Time: 12/21/14 12:38 AM  Result Value Ref Range Status   MRSA, PCR NEGATIVE NEGATIVE Final   Staphylococcus aureus NEGATIVE NEGATIVE Final    Comment:        The Xpert SA Assay (FDA approved for NASAL specimens in patients over 61 years of age), is one component of a comprehensive surveillance program.  Test performance has been validated by [facility] for patients greater than or equal to 77 year old. It is not intended to diagnose infection nor to guide or monitor treatment.            Studies: No results found.      Scheduled Meds: . acetaminophen  1,000 mg Oral Once  . amiodarone  200 mg Oral Daily  . amLODipine  10 mg Oral Daily  . buPROPion  300 mg Oral Daily  .  ceFAZolin (ANCEF) IV  3 g Intravenous On Call to OR  . diltiazem  240 mg Oral Daily  . divalproex  1,000 mg Oral Daily  . docusate sodium  100 mg Oral BID  . hydrALAZINE  10 mg Oral Daily  . insulin aspart  0-15 Units Subcutaneous TID WC  . insulin aspart  0-5 Units Subcutaneous QHS  . insulin aspart protamine- aspart  55 Units  Subcutaneous BID WC  . lamoTRIgine  25 mg Oral Daily  . metoprolol succinate  50 mg Oral Daily  . senna  1 tablet Oral BID   Continuous Infusions: . dextrose 5 % and 0.45% NaCl 100 mL/hr (12/21/14 0600)    Active Problems:   Diabetes   Morbid obesity   CHF (congestive heart failure)   OSA (obstructive sleep apnea)   A-fib   Asthma   Left knee pain   Hypoglycemia   Patellar tendon rupture    Time spent: 40 minutes.    Marcellus Scott, MD, FACP, FHM. [facility] Pager [phone]  If 7PM-7AM, please contact night-coverage [email or web address] Password [email or web address] 12/21/2014, 10:17 AM    LOS: 3 days

## 2014-12-21 NOTE — H&P (View-Only) (Signed)
I have reviewed his films and am concerned for possible patella alta indicating a patella tendon rupture. I have ordered knee films of both sides for comparison.   If he can d/c then f/u with me in clinic, if not I will perform formal consult within 24hrs.   Margarita RanaMURPHY, TIMOTHY D Cell: [phone]

## 2014-12-22 ENCOUNTER — Encounter (HOSPITAL_COMMUNITY): Payer: Self-pay | Admitting: Orthopedic Surgery

## 2014-12-22 DIAGNOSIS — I480 Paroxysmal atrial fibrillation: Secondary | ICD-10-CM | POA: Insufficient documentation

## 2014-12-22 DIAGNOSIS — J9611 Chronic respiratory failure with hypoxia: Secondary | ICD-10-CM

## 2014-12-22 DIAGNOSIS — IMO0002 Reserved for concepts with insufficient information to code with codable children: Secondary | ICD-10-CM | POA: Insufficient documentation

## 2014-12-22 DIAGNOSIS — E1165 Type 2 diabetes mellitus with hyperglycemia: Secondary | ICD-10-CM | POA: Insufficient documentation

## 2014-12-22 DIAGNOSIS — I10 Essential (primary) hypertension: Secondary | ICD-10-CM | POA: Insufficient documentation

## 2014-12-22 LAB — BASIC METABOLIC PANEL WITH GFR
Anion gap: 7 (ref 5–15)
Anion gap: 9 (ref 5–15)
BUN: 13 mg/dL (ref 6–23)
BUN: 14 mg/dL (ref 6–23)
CO2: 31 mmol/L (ref 19–32)
CO2: 30 mmol/L (ref 19–32)
Calcium: 8.2 mg/dL — ABNORMAL LOW (ref 8.4–10.5)
Calcium: 8.5 mg/dL (ref 8.4–10.5)
Chloride: 103 mmol/L (ref 96–112)
Chloride: 101 mmol/L (ref 96–112)
Creatinine, Ser: 1 mg/dL (ref 0.50–1.35)
Creatinine, Ser: 1 mg/dL (ref 0.50–1.35)
GFR calc Af Amer: >90 mL/min (ref >90)
GFR calc Af Amer: >90 mL/min (ref >90)
GFR calc non Af Amer: 90 mL/min — ABNORMAL LOW (ref >90)
GFR calc non Af Amer: 90 mL/min — ABNORMAL LOW (ref >90)
Glucose, Bld: 133 mg/dL — ABNORMAL HIGH (ref 70–99)
Glucose, Bld: 140 mg/dL — ABNORMAL HIGH (ref 70–99)
Potassium: 6 mmol/L — ABNORMAL HIGH (ref 3.5–5.1)
Potassium: 4.8 mmol/L (ref 3.5–5.1)
Sodium: 141 mmol/L (ref 135–145)
Sodium: 140 mmol/L (ref 135–145)

## 2014-12-22 LAB — GLUCOSE, CAPILLARY
GLUCOSE-CAPILLARY: 130 mg/dL — AB (ref 70–99)
Glucose-Capillary: 173 mg/dL — ABNORMAL HIGH (ref 70–99)
Glucose-Capillary: 157 mg/dL — ABNORMAL HIGH (ref 70–99)
Glucose-Capillary: 147 mg/dL — ABNORMAL HIGH (ref 70–99)
Glucose-Capillary: 181 mg/dL — ABNORMAL HIGH (ref 70–99)

## 2014-12-22 LAB — CBC
HCT: 38.5 % — ABNORMAL LOW (ref 39.0–52.0)
HEMOGLOBIN: 12.1 g/dL — AB (ref 13.0–17.0)
MCH: 27.2 pg (ref 26.0–34.0)
MCHC: 31.4 g/dL (ref 30.0–36.0)
MCV: 86.5 fL (ref 78.0–100.0)
Platelets: 210 10*3/uL (ref 150–400)
RBC: 4.45 MIL/uL (ref 4.22–5.81)
RDW: 16.4 % — ABNORMAL HIGH (ref 11.5–15.5)
WBC: 10.2 10*3/uL (ref 4.0–10.5)

## 2014-12-22 MED ORDER — HYDROMORPHONE HCL 1 MG/ML IJ SOLN
1.0000 mg | INTRAMUSCULAR | Status: DC | PRN
  Administered 2014-12-22 – 2014-12-23 (×3): 1 mg via INTRAVENOUS
  Filled 2014-12-22 (×3): qty 1

## 2014-12-22 MED ORDER — OXYCODONE HCL 5 MG PO TABS
5.0000 mg | ORAL_TABLET | ORAL | Status: DC | PRN
  Administered 2014-12-22 – 2014-12-23 (×4): 10 mg via ORAL
  Filled 2014-12-22 (×4): qty 2

## 2014-12-22 MED ORDER — RIVAROXABAN 20 MG PO TABS
20.0000 mg | ORAL_TABLET | Freq: Every day | ORAL | Status: DC
  Administered 2014-12-22 – 2014-12-23 (×2): 20 mg via ORAL
  Filled 2014-12-22 (×2): qty 1

## 2014-12-22 NOTE — Clinical Documentation Improvement (Signed)
  Orthopedic Surgeon   In op note "debrided tendon from the patella"  please specify type of debridement performed.  Thank you    Please clarify documentation in the medical record of "debridement."  ? Excisional vs. Nonexcisional?    Thank You,  Lavonda JumboLawanda J Erdem Naas ,RN Clinical Documentation Specialist:  [phone]  [facility]- Health Information Management

## 2014-12-22 NOTE — Progress Notes (Signed)
PROGRESS NOTE    [name] AOZ:[id number] DOB: 11/02/1970 DOA: 12/16/2014 PCP: Emeterio ReeveWOLTERS,SHARON A, MD  HPI/Brief narrative 44 year old male with history of DM 2/IDDM, atrial fibrillation on Xarelto, chronic respiratory failure on home oxygen 2 L/m, OSA/OHS on nightly C Pap, morbid obesity, essential hypertension, chronic diastolic CHF, asthma, recently treated for asthma exacerbation with prednisone and newly started on NovoLog SSI, [facility] to the [facility] on 12/16/14 after a fall when he went to get some soda. He felt like he was hypoglycemic. Patient states that his CBGs were in the mid 30s in ED. Diagnosed with left patellar tendon rupture and unable to bear weight. Hence [facility] by orthopedics on 3/21. S/p surgery 3/21. DC to SNF when stable   Assessment/Plan:  1. Left patellar tendon rupture: Sustained status post mechanical fall. Patient was unable to get up with PT. Orthopedics was consulted and patient underwent left patellar tendon repair on 3/21. Orthopedics recommends up with therapy, WBAT in left leg with knee immobilizer at all times. Discussed with Dr. Gardiner RhymeMurphy-clear to resume Xarelto 3/22. PT recommends SNF. 2. PAF with controlled ventricular rate: EKG 12/20/14 without acute changes. Continue amiodarone, diltiazem and metoprolol. Xarelto was held for surgery and will be resumed 3/22.  3. Asthma: Stable. Unable to determine type-defer to outpatient MDs. 4. Chronic hypoxic respiratory failure: On home oxygen 2 L/m. Continue. 5. OSA/OHS on nightly C Pap: Continue C Pap. 6. Uncontrolled type II DM: Recent hypoglycemia probably related to insulins. Hypoglycemia resolved. Continue 70/30 insulin and SSI. Reasonable control today. 7. Essential hypertension: Controlled on amlodipine, hydralazine, metoprolol and diltiazem. 8. Morbid obesity: 9. Chronic diastolic CHF: Compensated 10. Atypical chest pain: Had chest pain on night of 3/18. Resolved  without recurrence. EKG without acute changes. Troponin 3 negative.? GI etiology from GERD. Monitor. Previous episodes of recurrent atypical chest pain. As per primary cardiologist note 06/19/13: too large for cath, CT, nuclear study images is not adequate for dobutamine echo). No recurrence. 11. Hyperkalemia: No labs were available for review early this morning. Subsequent BMP shows potassium of 6 but normal creatinine (M.D. was not alerted regarding abnormal labs). Rule out lab error. We will repeat stat BMP to assess. If truly elevated, will check CK and provide Kayexalate with close BMP follow-up.  Code Status: Full Family Communication: None at bedside Disposition Plan: DC to SNF in 1-2 days   Consultants:  Orthopedics  Procedures:  Left patellar tendon repair 3/21  Antibiotics:  None   Subjective: Expected postop left knee pain. No reported chest pain or dyspnea.  Objective: Filed Vitals:   12/21/14 2042 12/22/14 0224 12/22/14 0451 12/22/14 0528  BP: 126/68   140/56  Pulse: 95 79  110  Temp: 97.9 F (36.6 C)   99.5 F (37.5 C)  TempSrc: Oral   Oral  Resp:  20    Height:      Weight:   171.006 kg (377 lb)   SpO2: 93% 94%  94%    Intake/Output Summary (Last 24 hours) at 12/22/14 1348 Last data filed at 12/22/14 0653  Gross per 24 hour  Intake   1181 ml  Output   1730 ml  Net   -549 ml   Filed Weights   12/20/14 0403 12/21/14 0700 12/22/14 0451  Weight: 267.622 kg (590 lb) 276.694 kg (610 lb) 171.006 kg (377 lb)     Exam:  General exam: Moderately built and morbidly obese male lying comfortably supine and slightly in  left lateral position without distress. Respiratory system: Clear. No increased work of breathing. Cardiovascular system: S1 & S2 heard, irregularly irregular. No JVD, murmurs, gallops, clicks. Trace bilateral ankle edema. Gastrointestinal system: Abdomen is obese + + +/ nondistended, soft and nontender. Normal bowel sounds heard. Central  nervous system: Alert and oriented. No focal neurological deficits. Extremities: Symmetric 5 x 5 power. Left knee dressing and immobilizer in tract.   Data Reviewed: Basic Metabolic Panel:  Recent Labs Lab 12/16/14 2159 12/17/14 0440 12/22/14 0605  NA 144 145 141  K 4.0 3.8 6.0*  CL 108 107 103  CO2  --  28 31  GLUCOSE 138* 161* 133*  BUN CREATININE 1.10 1.17 1.00  CALCIUM  --  8.5 8.2*  MG  --  2.0  --   PHOS  --  4.0  --    Liver Function Tests:  Recent Labs Lab 12/17/14 0440  AST 17  ALT 12  ALKPHOS 43  BILITOT 0.9  PROT 6.0  ALBUMIN 3.2*   No results for input(s): LIPASE, AMYLASE in the last 168 hours. No results for input(s): AMMONIA in the last 168 hours. CBC:  Recent Labs Lab 12/16/14 2142 12/16/14 2159 12/17/14 0440 12/22/14 0605  WBC 13.5*  --  10.2 10.2  HGB 13.5 15.0 12.1* 12.1*  HCT 41.2 44.0 37.0* 38.5*  MCV 83.7  --  84.1 86.5  PLT 207  --  179 210   Cardiac Enzymes:  Recent Labs Lab 12/18/14 2328 12/19/14 0320 12/19/14 0835  TROPONINI <0.03 <0.03 <0.03   BNP (last 3 results)  Recent Labs  03/25/14 2110  PROBNP 33.8   CBG:  Recent Labs Lab 12/21/14 1537 12/21/14 2151 12/22/14 0634 12/22/14 0948 12/22/14 1134  GLUCAP 158* 226* 130* 173* 157*    Recent Results (from the past 240 hour(s))  Surgical pcr screen     Status: None   Collection Time: 12/21/14 12:38 AM  Result Value Ref Range Status   MRSA, PCR NEGATIVE NEGATIVE Final   Staphylococcus aureus NEGATIVE NEGATIVE Final    Comment:        The Xpert SA Assay (FDA approved for NASAL specimens in patients over 98 years of age), is one component of a comprehensive surveillance program.  Test performance has been validated by [facility] for patients greater than or equal to 74 year old. It is not intended to diagnose infection nor to guide or monitor treatment.            Studies: No results found.      Scheduled Meds: . amiodarone   200 mg Oral Daily  . amLODipine  10 mg Oral Daily  . buPROPion  300 mg Oral Daily  . diltiazem  240 mg Oral Daily  . divalproex  1,000 mg Oral Daily  . docusate sodium  100 mg Oral BID  . hydrALAZINE  10 mg Oral Daily  . insulin aspart  0-15 Units Subcutaneous TID WC  . insulin aspart  0-5 Units Subcutaneous QHS  . insulin aspart protamine- aspart  55 Units Subcutaneous BID WC  . lamoTRIgine  25 mg Oral Daily  . metoprolol succinate  50 mg Oral Daily  . rivaroxaban  20 mg Oral Q supper  . senna  1 tablet Oral BID   Continuous Infusions: . lactated ringers 10 mL/hr at 12/22/14 1610    Active Problems:   Diabetes   Morbid obesity   CHF (congestive heart failure)   OSA (obstructive  sleep apnea)   A-fib   Asthma   Left knee pain   Hypoglycemia   Patellar tendon rupture    Time spent: 40 minutes.    Marcellus Scott, MD, FACP, FHM. [facility] Pager [phone]  If 7PM-7AM, please contact night-coverage [email or web address] Password [email or web address] 12/22/2014, 1:48 PM    LOS: 4 days

## 2014-12-22 NOTE — Progress Notes (Signed)
ANTICOAGULATION CONSULT NOTE - Initial Consult  Pharmacy Consult for xarelto Indication: atrial fibrillation  Allergies  Allergen Reactions  . Iodine Anaphylaxis and Other (See Comments)    Associated with the shellfish allergy  . Shellfish Allergy Anaphylaxis    Patient Measurements: Height: 6\' 3"  (190.5 cm) Weight: (!) 377 lb (171.006 kg) (on bariatric bed) IBW/kg (Calculated) : 84.5  Vital Signs: Temp: 99.5 F (37.5 C) (03/22 0528) Temp Source: Oral (03/22 0528) BP: 140/56 mmHg (03/22 0528) Pulse Rate: 110 (03/22 0528)  Labs:  Recent Labs  12/22/14 0605  HGB 12.1*  HCT 38.5*  PLT 210  CREATININE 1.00    Estimated Creatinine Clearance: 158.8 mL/min (by C-G formula based on Cr of 1).   Medical History: Past Medical History  Diagnosis Date  . CHF (congestive heart failure)     EF55-60%  . HTN (hypertension)   . Ptosis   . Atrial flutter   . Atrial fibrillation   . Morbid obesity   . DM2 (diabetes mellitus, type 2)   . Depression   . Chronic low back pain   . Asthma   . Vision abnormalities   . Abscess     right thigh  . Sleep apnea   . Chronic respiratory failure   . Obesity hypoventilation syndrome    Assessment: 43 yom on chronic xarelto for afib admitted s/p fall with knee pain. Now s/p patellar tendon repair yesterday to resume xarelto for anticoagulation. Pt has great renal fxn. H/H slightly low but platelets are WNL there are no signs of bleeding noted.   Goal of Therapy:  Therapeutic anticoagulation Monitor platelets by anticoagulation protocol: Yes   Plan:  - Xarelto 20mg  daily - F/u S&S of bleeding, renal fxn  *Pharmacy will sign off and follow peripherally. Thank you for the consult!  Lysle Pearlachel Donyetta Ogletree, PharmD, BCPS Pager # [phone] 12/22/2014 10:37 AM

## 2014-12-22 NOTE — Evaluation (Signed)
Physical Therapy Evaluation Patient Details Name: [name] J Datta MRN: [id number] DOB: 04/14/1971 Today's Date: 12/22/2014   History of Present Illness  Pt with h/o morbid obesity who fell and sustained L patellar tendon rupture. Pt s/p patellar tendon repair 3/21.  Clinical Impression  Pt to requires assist x 2 for transfers and OOB mobility. Ambulation limited by L knee pain. Awaiting orders to progress L knee ROM or initiated exercises. Pt unsafe to d/c home as he would be home alone during the day and he currently requires 2 people for safe mobility.    Follow Up Recommendations SNF    Equipment Recommendations  None recommended by PT    Recommendations for Other Services       Precautions / Restrictions Precautions Precautions: Fall Required Braces or Orthoses: Knee Immobilizer - Left Knee Immobilizer - Left: On at all times Restrictions Weight Bearing Restrictions: Yes LLE Weight Bearing: Weight bearing as tolerated      Mobility  Bed Mobility Overal bed mobility: +2 for physical assistance;Needs Assistance Bed Mobility: Supine to Sit     Supine to sit: Mod assist;+2 for physical assistance;HOB elevated     General bed mobility comments: assist for L LE and to bring hips to EOB  Transfers Overall transfer level: Needs assistance Equipment used: Rolling walker (2 wheeled) Transfers: Sit to/from [employer] CorporationStand;Stand Pivot Transfers Sit to Stand: Mod assist;+2 physical assistance;+2 safety/equipment;From elevated surface Stand pivot transfers: Min assist;+2 physical assistance;+2 safety/equipment       General transfer comment: bed extremely elevated, v/c's for hand placement, had to rock back and forth to gain momentum, pushed up from bed, assist to manage L LE  Ambulation/Gait             General Gait Details: pt took 5 steps to chair with wide base of support and increased support on RW  Stairs            Wheelchair Mobility    Modified Rankin (Stroke  Patients Only)       Balance                                             Pertinent Vitals/Pain Pain Assessment: 0-10 Pain Score: 10-Worst pain ever Pain Location: L knee Pain Intervention(s): Monitored during session    Home Living Family/patient expects to be discharged to:: Skilled nursing facility                 Additional Comments: pt was living at home with multilple family members    Prior Function Level of Independence: Independent         Comments: 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     Higher education careers adviserHand Dominance        Extremity/Trunk Assessment   Upper Extremity Assessment: Overall WFL for tasks assessed           Lower Extremity Assessment: LLE deficits/detail   LLE Deficits / Details: did not initiate ROM as there were no orders  Cervical / Trunk Assessment: Normal  Communication   Communication: No difficulties  Cognition Arousal/Alertness: Awake/alert Behavior During Therapy: WFL for tasks assessed/performed Overall Cognitive Status: Within Functional Limits for tasks assessed                      General Comments      Exercises  Assessment/Plan    PT Assessment Patient needs continued PT services  PT Diagnosis Difficulty walking;Acute pain   PT Problem List Decreased strength;Decreased range of motion;Decreased activity tolerance;Decreased balance;Decreased mobility;Decreased knowledge of use of DME;Obesity;Pain  PT Treatment Interventions DME instruction;Gait training;Functional mobility training;Therapeutic activities;Therapeutic exercise;Patient/family education   PT Goals (Current goals can be found in the Care Plan section) Acute Rehab PT Goals PT Goal Formulation: With patient Time For Goal Achievement: 01/29/15 Potential to Achieve Goals: Fair    Frequency Min 4X/week   Barriers to discharge Decreased caregiver support      Co-evaluation               End of Session  Equipment Utilized During Treatment: Gait belt;Left knee immobilizer Activity Tolerance: Patient limited by pain Patient left: in chair;with call bell/phone within reach Nurse Communication: Mobility status;Need for lift equipment         Time: 2130-8657 PT Time Calculation (min) (ACUTE ONLY): 42 min   Charges:   PT Evaluation $Initial PT Evaluation Tier I: 1 Procedure PT Treatments $Gait Training: 8-22 mins $Therapeutic Activity: 8-22 mins   PT G Codes:        Marcene Brawn 12/22/2014, 1:17 PM Lewis Shock, PT, DPT Pager #: [phone] Office #: [phone]

## 2014-12-22 NOTE — Progress Notes (Signed)
OT Cancellation Note  Patient Details Name: [name] MRN: [id number] DOB: 02/18/1971   Cancelled Treatment:    Reason Eval/Treat Not Completed: Other (comment) Pt is Medicare and current D/C plan is SNF. No apparent immediate acute care OT needs, therefore will defer OT to SNF. If OT eval is needed please call Acute Rehab Dept. at [phone] or text page OT at [phone].    Evette GeorgesLeonard, Demetries Coia Eva [phone] 12/22/2014, 7:44 AM

## 2014-12-22 NOTE — Clinical Documentation Improvement (Signed)
MD's, NP'S, and PA's  Patient has "asthma" if possible could more specificity be added to diagnosis if known.  Thank you  Possible Conditions  Severity:  Mild/Moderate/Severe (choose one)   and   Frequency: Intermittent/Persistent  Other  Not able to determine    Risk Factors: OHS/OSA, Morbid Obesity, GERD, Chronic Resp Failure    Treatment: oxygen, cont home nebulizer   Thank you,  Lavonda JumboLawanda J Daiya Tamer ,RN Clinical Documentation Specialist:  [phone]  [facility]- Health Information Management

## 2014-12-22 NOTE — Progress Notes (Signed)
     Subjective:  POD#1 Left patellar tendon repair.  Patient reports pain as mild to moderate.  Resting comfortably in chair.  Objective:   VITALS:   Filed Vitals:   12/21/14 2042 12/22/14 0224 12/22/14 0451 12/22/14 0528  BP: 126/68   140/56  Pulse: 95 79  110  Temp: 97.9 F (36.6 C)   99.5 F (37.5 C)  TempSrc: Oral   Oral  Resp:  20    Height:      Weight:   171.006 kg (377 lb)   SpO2: 93% 94%  94%    Neurologically intact ABD soft Neurovascular intact Sensation intact distally Intact pulses distally Incision: dressing C/D/I   Lab Results  Component Value Date   WBC 10.2 12/22/2014   HGB 12.1* 12/22/2014   HCT 38.5* 12/22/2014   MCV 86.5 12/22/2014   PLT 210 12/22/2014   BMET    Component Value Date/Time   NA 141 12/22/2014 0605   K 6.0* 12/22/2014 0605   CL 103 12/22/2014 0605   CO2 31 12/22/2014 0605   GLUCOSE 133* 12/22/2014 0605   BUN 13 12/22/2014 0605   CREATININE 1.00 12/22/2014 0605   CALCIUM 8.2* 12/22/2014 0605   GFRNONAA 90* 12/22/2014 0605   GFRAA >90 12/22/2014 0605     Assessment/Plan: 1 Day Post-Op   Active Problems:   Diabetes   Morbid obesity   CHF (congestive heart failure)   OSA (obstructive sleep apnea)   A-fib   Asthma   Left knee pain   Hypoglycemia   Patellar tendon rupture   Up with therapy WBAT in the left leg in the knee immobilizer at all times Continue Xarelto for DVT prophylaxis per medicine   Zareth Rippetoe Marie 12/22/2014, 12:39 PM Cell [phone]

## 2014-12-23 LAB — CBC
HCT: 35.4 % — ABNORMAL LOW (ref 39.0–52.0)
Hemoglobin: 11.4 g/dL — ABNORMAL LOW (ref 13.0–17.0)
MCH: 27.2 pg (ref 26.0–34.0)
MCHC: 32.2 g/dL (ref 30.0–36.0)
MCV: 84.5 fL (ref 78.0–100.0)
PLATELETS: 211 10*3/uL (ref 150–400)
RBC: 4.19 MIL/uL — ABNORMAL LOW (ref 4.22–5.81)
RDW: 16 % — ABNORMAL HIGH (ref 11.5–15.5)
WBC: 8.8 10*3/uL (ref 4.0–10.5)

## 2014-12-23 LAB — BASIC METABOLIC PANEL
Anion gap: 7 (ref 5–15)
BUN: 8 mg/dL (ref 6–23)
CALCIUM: 8.7 mg/dL (ref 8.4–10.5)
CO2: 32 mmol/L (ref 19–32)
Chloride: 101 mmol/L (ref 96–112)
Creatinine, Ser: 0.82 mg/dL (ref 0.50–1.35)
GFR calc Af Amer: >90 mL/min (ref >90)
GFR calc non Af Amer: >90 mL/min (ref >90)
Glucose, Bld: 118 mg/dL — ABNORMAL HIGH (ref 70–99)
Potassium: 4.2 mmol/L (ref 3.5–5.1)
Sodium: 140 mmol/L (ref 135–145)

## 2014-12-23 LAB — GLUCOSE, CAPILLARY
GLUCOSE-CAPILLARY: 117 mg/dL — AB (ref 70–99)
Glucose-Capillary: 133 mg/dL — ABNORMAL HIGH (ref 70–99)
Glucose-Capillary: 136 mg/dL — ABNORMAL HIGH (ref 70–99)

## 2014-12-23 MED ORDER — METHOCARBAMOL 500 MG PO TABS: 500.0000 mg | ORAL_TABLET | Freq: Four times a day (QID) | ORAL | Status: DC

## 2014-12-23 MED ORDER — OXYCODONE-ACETAMINOPHEN 5-325 MG PO TABS: 1.0000 | ORAL_TABLET | ORAL | Status: DC | PRN

## 2014-12-23 MED ORDER — ALPRAZOLAM 0.5 MG PO TABS: 0.5000 mg | ORAL_TABLET | Freq: Every day | ORAL | Status: AC | PRN

## 2014-12-23 MED ORDER — SENNA 8.6 MG PO TABS: 1.0000 | ORAL_TABLET | Freq: Two times a day (BID) | ORAL | Status: DC

## 2014-12-23 MED ORDER — INSULIN ASPART PROT & ASPART (70-30 MIX) 100 UNIT/ML ~~LOC~~ SUSP: 55.0000 [IU] | Freq: Two times a day (BID) | SUBCUTANEOUS | Status: DC

## 2014-12-23 NOTE — Progress Notes (Signed)
Pt is refusing to go on CPAP at this time. Will call when ready.

## 2014-12-23 NOTE — Clinical Social Work Placement (Addendum)
Clinical Social Work Department CLINICAL SOCIAL WORK PLACEMENT NOTE 12/23/2014  Patient:  [name],[name]  Account Number:  [id number] Admit date:  12/16/2014  Clinical Social Worker:  Mosie EpsteinEMILY S Mckenzi Buonomo, LCSWA  Date/time:  12/23/2014 11:16 AM  Clinical Social Work is seeking post-discharge placement for this patient at the following level of care:   SKILLED NURSING   (*CSW will update this form in Epic as items are completed)   12/23/2014  Patient/family provided with [facility] Department of Clinical Social Work's list of facilities offering this level of care within the geographic area requested by the patient (or if unable, by the patient's family).  12/23/2014  Patient/family informed of their freedom to choose among providers that offer the needed level of care, that participate in Medicare, Medicaid or managed care program needed by the patient, have an available bed and are willing to accept the patient.  12/23/2014  Patient/family informed of [facility]' ownership interest in [facility], as well as of the fact that they are under no obligation to receive care at this facility.  PASARR submitted to EDS on 12/23/2014 PASARR number received on 12/23/2014  FL2 transmitted to all facilities in geographic area requested by pt/family on  12/23/2014 FL2 transmitted to all facilities within larger geographic area on 12/23/2014  Patient informed that his/her managed care company has contracts with or will negotiate with  certain facilities, including the following:     Patient/family informed of bed offers received:  12/23/2014 [facility] Physician recommends and patient chooses bed at    [facility] on 12/23/2014  Patient to be transferred to facility by [facility] Patient and family notified of transfer on 12/23/2014 Name of family member notified:  Patient updated at bedside.  The following  physician request were entered in Epic:   Additional Comments:  Lily Kochermily Brentton Wardlow, LCSWA [phone]) Licensed Clinical Social Worker Orthopedics [phone]([id number]) and Surgical [phone]([id number])

## 2014-12-23 NOTE — Clinical Social Work Psychosocial (Signed)
Clinical Social Work Department BRIEF PSYCHOSOCIAL ASSESSMENT 12/23/2014  Patient:  [name], [name]     Account Number:  [id number]     Admit date:  12/16/2014  Clinical Social Worker:  Delrae Sawyers  Date/Time:  12/23/2014 11:08 AM  Referred by:  Physician  Date Referred:  12/23/2014 Referred for  SNF Placement   Other Referral:   none.   Interview type:  Patient Other interview type:   none.    PSYCHOSOCIAL DATA Living Status:  OTHER Admitted from facility:   Level of care:   Primary support name:  None given. Primary support relationship to patient:  NONE Degree of support available:   Patient reports patient lives with friends/roomates, but they are unable to provide support.    CURRENT CONCERNS Current Concerns  Post-Acute Placement   Other Concerns:   none.    SOCIAL WORK ASSESSMENT / PLAN CSW received referral for possible SNF placement at time of discharge. CSW met with patient at bedside to discuss discharge disposition planning. Patient informed CSW patient aware of PT recommendation and would prefer to be discharged to [facility], as patient has been there previously. CSW explained to patient that due to patient's current medical conditions (specifically patient's weight) patient may be discharged to an out-of-state facility. Patient stated patient was agreeable to placement out-of-state, but would prefer to complete rehabilitation locally. Per patient, patient feels unsafe returning home with home health services.    CSW spoke with [facility] admissions liaison regarding possible admission. CSW consulted with CSW Director regarding patient's discharge disposition.   Assessment/plan status:  Psychosocial Support/Ongoing Assessment of Needs Other assessment/ plan:   none.   Information/referral to community resources:   [place], [place], [place], and [employer].    CSW to possible  complete out-of-state referrals.    PATIENT'S/FAMILY'S RESPONSE TO PLAN OF CARE: Patient understanding and agreeable to CSW plan of care. Patient expressed no further questions or concerns at this time.       Lubertha Sayres, [place] [phone]) Licensed Clinical Social Worker Orthopedics [phone]) and Surgical [phone])

## 2014-12-23 NOTE — Progress Notes (Signed)
     Subjective:  POD#2 from left patellar tendon repair. Patient reports pain as mild to moderate.  Patient was able to get up and walk to the chair with PT yesterday.  Objective:   VITALS:   Filed Vitals:   12/22/14 0528 12/22/14 1633 12/22/14 2150 12/23/14 0522  BP: 140/56  124/59 137/47  Pulse: 110  63 105  Temp: 99.5 F (37.5 C)  98.7 F (37.1 C) 99.4 F (37.4 C)  TempSrc: Oral  Oral Oral  Resp:   18 18  Height:      Weight:  610 kg (1344 lb 12.9 oz)  610 kg (1344 lb 12.9 oz)  SpO2: 94%  95% 94%    Neurologically intact ABD soft Neurovascular intact Sensation intact distally Intact pulses distally Dorsiflexion/Plantar flexion intact Incision: dressing C/D/I   Lab Results  Component Value Date   WBC 10.2 12/22/2014   HGB 12.1* 12/22/2014   HCT 38.5* 12/22/2014   MCV 86.5 12/22/2014   PLT 210 12/22/2014   BMET    Component Value Date/Time   NA 140 12/22/2014 1540   K 4.8 12/22/2014 1540   CL 101 12/22/2014 1540   CO2 30 12/22/2014 1540   GLUCOSE 140* 12/22/2014 1540   BUN 14 12/22/2014 1540   CREATININE 1.00 12/22/2014 1540   CALCIUM 8.5 12/22/2014 1540   GFRNONAA 90* 12/22/2014 1540   GFRAA >90 12/22/2014 1540     Assessment/Plan: 2 Days Post-Op   Active Problems:   Diabetes   Morbid obesity   CHF (congestive heart failure)   OSA (obstructive sleep apnea)   A-fib   Asthma   Left knee pain   Hypoglycemia   Patellar tendon rupture   DM (diabetes mellitus), type 2, uncontrolled   Essential hypertension   Paroxysmal atrial fibrillation   Chronic respiratory failure with hypoxia   Up with therapy WBAT in the left leg, while in the knee immobilizer at all times Continue Xarelto for DVT prophylaxis per medicine PT recommends SNF, ok from ortho standpoint to be transferred once cleared by medicine   Aalivia Mcgraw Marie 12/23/2014, 7:56 AM Cell [phone]

## 2014-12-23 NOTE — Progress Notes (Signed)
Orthopedic Tech Progress Note Patient Details:  Maurice Marchaul J Solan 07/22/1971 [id number] Applied fiberglass posterior long leg splint to LLE.  Pulses, motion, sensation intact before and after splinting.  Capillary refill less than 2 seconds before and after splinting.  Per verbal order, left pt.'s Lt. foot free, ending splint just superior to ankle. Ortho Devices Type of Ortho Device: Post (long leg) splint Ortho Device/Splint Location: LLE Ortho Device/Splint Interventions: Application   Lesle ChrisGilliland, Enslie Sahota L 12/23/2014, 6:13 PM

## 2014-12-23 NOTE — Discharge Summary (Signed)
PATIENT DETAILS Name: [name] Age: 44 y.o. Sex: male Date of Birth: 1971-03-09 MRN: [id number]. Admitting Physician: Therisa Doyne, MD WUJ:WJXBJYN,WGNFAO A, MD  Admit Date: 12/16/2014 Discharge date: 12/23/2014  Recommendations for Outpatient Follow-up:  1. Repeat CBC and chemistries in 1-2 weeks 2. On home oxygen 2 L/m 3. Will need CPAP at night 4. And she'll follow-up with orthopedics-Dr. Eulah Pont in 1 week  PRIMARY DISCHARGE DIAGNOSIS:  Active Problems:   Diabetes   Morbid obesity   CHF (congestive heart failure)   OSA (obstructive sleep apnea)   A-fib   Asthma   Left knee pain   Hypoglycemia   Patellar tendon rupture   DM (diabetes mellitus), type 2, uncontrolled   Essential hypertension   Paroxysmal atrial fibrillation   Chronic respiratory failure with hypoxia      PAST MEDICAL HISTORY: Past Medical History  Diagnosis Date  . CHF (congestive heart failure)     EF55-60%  . HTN (hypertension)   . Ptosis   . Atrial flutter   . Atrial fibrillation   . Morbid obesity   . DM2 (diabetes mellitus, type 2)   . Depression   . Chronic low back pain   . Asthma   . Vision abnormalities   . Abscess     right thigh  . Sleep apnea   . Chronic respiratory failure   . Obesity hypoventilation syndrome     DISCHARGE MEDICATIONS: Current Discharge Medication List    START taking these medications   Details  insulin aspart protamine- aspart (NOVOLOG MIX 70/30) (70-30) 100 UNIT/ML injection Inject 0.55 mLs (55 Units total) into the skin 2 (two) times daily with a meal. Qty: 10 mL, Refills: 11    methocarbamol (ROBAXIN) 500 MG tablet Take 1 tablet (500 mg total) by mouth 4 (four) times daily. Qty: 60 tablet, Refills: 0    oxyCODONE-acetaminophen (PERCOCET) 5-325 MG per tablet Take 1-2 tablets by mouth every 4 (four) hours as needed for severe pain. Qty: 90 tablet, Refills: 0    senna (SENOKOT) 8.6 MG TABS tablet Take 1 tablet (8.6 mg total) by mouth 2  (two) times daily. Qty: 120 each, Refills: 0      CONTINUE these medications which have CHANGED   Details  ALPRAZolam (XANAX) 0.5 MG tablet Take 1 tablet (0.5 mg total) by mouth daily as needed for anxiety. Qty: 15 tablet, Refills: 0      CONTINUE these medications which have NOT CHANGED   Details  albuterol (VENTOLIN HFA) 108 (90 BASE) MCG/ACT inhaler Inhale 2 puffs into the lungs every 6 (six) hours as needed. Shortness of breath Qty: 1 Inhaler, Refills: 0    amiodarone (PACERONE) 200 MG tablet Take 200 mg by mouth daily.    amLODipine (NORVASC) 10 MG tablet Take 10 mg by mouth daily.     buPROPion (WELLBUTRIN XL) 300 MG 24 hr tablet Take 300 mg by mouth Daily.     cyclobenzaprine (FLEXERIL) 10 MG tablet Take 10 mg by mouth 3 (three) times daily as needed for muscle spasms.    DILT-XR 240 MG 24 hr capsule Take 1 capsule by mouth daily. Refills: 6    divalproex (DEPAKOTE) 500 MG DR tablet Take 1,000 mg by mouth daily.     hydrALAZINE (APRESOLINE) 10 MG tablet Take 1 tablet (10 mg total) by mouth every 8 (eight) hours. Qty: 90 tablet, Refills: 0    insulin aspart (NOVOLOG) 100 UNIT/ML injection Sliding scale   CBG 70 - 120: 0  units:  CBG 121 - 150: 2 units;  CBG 151 - 200: 3 units;  CBG 201 - 250: 5 units;  CBG 251 - 300: 8 units; CBG 301 - 350: 11 units; CBG 351 - 400: 15 units;   Use while on steroids/prednisone. Qty: 10 mL, Refills: 2    lamoTRIgine (LAMICTAL) 25 MG tablet Take 25 mg by mouth daily.     metoprolol (TOPROL-XL) 50 MG 24 hr tablet Take 50 mg by mouth daily.    Naftifine HCl (NAFTIN) 2 % CREA Apply 1 application topically daily. Apply to feet.    nitroGLYCERIN (NITROSTAT) 0.4 MG SL tablet Place 0.4 mg under the tongue once.    rivaroxaban (XARELTO) 20 MG TABS tablet Take 1 tablet (20 mg total) by mouth daily with supper. Qty: 30 tablet, Refills: 6      STOP taking these medications     HUMALOG MIX 75/25 (75-25) 100 UNIT/ML SUSP       azithromycin (ZITHROMAX) 500 MG tablet      ibuprofen (ADVIL,MOTRIN) 600 MG tablet      predniSONE (STERAPRED UNI-PAK) 10 MG tablet         ALLERGIES:   Allergies  Allergen Reactions  . Iodine Anaphylaxis and Other (See Comments)    Associated with the shellfish allergy  . Shellfish Allergy Anaphylaxis    BRIEF HPI:  See H&P, Labs, Consult and Test reports for all details in brief, patient 44 year old male with history of DM 2/IDDM, atrial fibrillation on Xarelto, chronic respiratory failure on home oxygen 2 L/m, OSA/OHS on nightly C Pap, morbid obesity, essential hypertension, chronic diastolic CHF, asthma, recently treated for asthma exacerbation with prednisone and newly started on NovoLog SSI, [facility] .Diagnosed with left patellar tendon rupture and unable to bear weight  CONSULTATIONS:   orthopedic surgery  PERTINENT RADIOLOGIC STUDIES: Dg Chest 2 View  11/28/2014   CLINICAL DATA:  Cough and shortness of breath for 3 days  EXAM: CHEST  2 VIEW  COMPARISON:  11/26/2014  FINDINGS: Cardiac shadow is mildly enlarged. Mild vascular congestion and pulmonary edema is again identified. The overall appearance is stable from the prior study. No new focal abnormality is seen.  IMPRESSION: No change from the previous exam.   Electronically Signed   By: Alcide Clever M.D.   On: 11/28/2014 10:41   Dg Chest 2 View  11/26/2014   CLINICAL DATA:  LEFT side chest pain, cough and shortness of breath today, history hypertension, type 2 diabetes, CHF, atrial fibrillation/flutter  EXAM: CHEST  2 VIEW  COMPARISON:  09/22/2014  FINDINGS: Enlargement of cardiac silhouette with pulmonary vascular congestion.  Asymmetric infiltrates RIGHT greater than LEFT favor asymmetric pulmonary edema over infection.  No pleural effusion or pneumothorax.  Respiratory motion artifacts degrade lateral view.  Scattered endplate spur formation thoracic spine.  IMPRESSION: Enlargement of  cardiac silhouette with pulmonary vascular congestion and asymmetric pulmonary infiltrates RIGHT greater than LEFT favor pulmonary edema over pneumonia.   Electronically Signed   By: Ulyses Southward M.D.   On: 11/26/2014 16:55   Dg Knee 1-2 Views Left  12/17/2014   CLINICAL DATA:  Patient status post fall. Low blood sugar. Initial encounter.  EXAM: LEFT KNEE - 1-2 VIEW  COMPARISON:  Radiographs 12/16/2014  FINDINGS: The patella is high-riding. There is soft tissue swelling the location of the patellar tendon. No definite acute fracture. Mild degenerative changes medial compartment with joint space narrowing and osteophytosis.  IMPRESSION: Findings  suspicious for patellar tendon rupture.   Electronically Signed   By: Annia Beltrew  Davis M.D.   On: 12/17/2014 16:25   Dg Knee 1-2 Views Right  12/17/2014   CLINICAL DATA:  Clinical suspicion for patella alta following a [facility] [employer] yesterday.  EXAM: RIGHT KNEE - 1-2 VIEW  COMPARISON:  None.  FINDINGS: The patella is in a normal location. No fracture, dislocation or effusion seen. Mild medial and lateral spur formation and mild medial and lateral joint space narrowing. Minimal posterior patellar spur formation.  IMPRESSION: Degenerative changes.  No acute abnormality.   Electronically Signed   By: Beckie SaltsSteven  Reid M.D.   On: 12/17/2014 16:20   Dg Tibia/fibula Left  12/16/2014   CLINICAL DATA:  Fall down steps with knee and lower leg pain, initial encounter  EXAM: LEFT TIBIA AND FIBULA - 2 VIEW  COMPARISON:  None.  FINDINGS: There is no evidence of fracture or other focal bone lesions. Soft tissues are unremarkable.  IMPRESSION: No acute abnormality is noted.   Electronically Signed   By: Alcide CleverMark  Lukens M.D.   On: 12/16/2014 19:27   Dg Femur Min 2 Views Left  12/16/2014   CLINICAL DATA:  Recent fall down stairs with leg pain  EXAM: LEFT FEMUR 2 VIEWS  COMPARISON:  None.  FINDINGS: There is no evidence of fracture or other focal bone lesions. Soft  tissues are unremarkable.  IMPRESSION: No acute abnormality is noted.   Electronically Signed   By: Alcide CleverMark  Lukens M.D.   On: 12/16/2014 19:45     PERTINENT LAB RESULTS: CBC:  Recent Labs  12/22/14 0605 12/23/14 0751  WBC 10.2 8.8  HGB 12.1* 11.4*  HCT 38.5* 35.4*  PLT 210 211   CMET CMP     Component Value Date/Time   NA 140 12/23/2014 0751   K 4.2 12/23/2014 0751   CL 101 12/23/2014 0751   CO2 32 12/23/2014 0751   GLUCOSE 118* 12/23/2014 0751   BUN 8 12/23/2014 0751   CREATININE 0.82 12/23/2014 0751   CALCIUM 8.7 12/23/2014 0751   PROT 6.0 12/17/2014 0440   ALBUMIN 3.2* 12/17/2014 0440   AST 17 12/17/2014 0440   ALT 12 12/17/2014 0440   ALKPHOS 43 12/17/2014 0440   BILITOT 0.9 12/17/2014 0440   GFRNONAA >90 12/23/2014 0751   GFRAA >90 12/23/2014 0751    GFR Estimated Creatinine Clearance: 262.4 mL/min (by C-G formula based on Cr of 0.82). No results for input(s): LIPASE, AMYLASE in the last 72 hours. No results for input(s): CKTOTAL, CKMB, CKMBINDEX, TROPONINI in the last 72 hours. Invalid input(s): POCBNP No results for input(s): DDIMER in the last 72 hours. No results for input(s): HGBA1C in the last 72 hours. No results for input(s): CHOL, HDL, LDLCALC, TRIG, CHOLHDL, LDLDIRECT in the last 72 hours. No results for input(s): TSH, T4TOTAL, T3FREE, THYROIDAB in the last 72 hours.  Invalid input(s): FREET3 No results for input(s): VITAMINB12, FOLATE, FERRITIN, TIBC, IRON, RETICCTPCT in the last 72 hours. Coags: No results for input(s): INR in the last 72 hours.  Invalid input(s): PT Microbiology: Recent Results (from the past 240 hour(s))  Surgical pcr screen     Status: None   Collection Time: 12/21/14 12:38 AM  Result Value Ref Range Status   MRSA, PCR NEGATIVE NEGATIVE Final   Staphylococcus aureus NEGATIVE NEGATIVE Final    Comment:        The Xpert SA Assay (FDA approved for NASAL  specimens in patients over 10 years of age), is one component of a  comprehensive surveillance program.  Test performance has been validated by [facility] for patients greater than or equal to 36 year old. It is not intended to diagnose infection nor to guide or monitor treatment.      BRIEF HOSPITAL COURSE:  1. Left patellar tendon rupture: Sustained status post mechanical fall. Orthopedics was consulted and patient underwent left patellar tendon repair on 3/21. Orthopedics recommends up with therapy, WBAT in left leg with knee immobilizer at all times. Per orthopedics, okay to resume Xarelto-restarted on 3/22. Seen by orthopedics, okay to discharge to SNF. Please and she'll follow-up with orthopedics in next 1-2 weeks. 2. PAF with controlled ventricular rate: EKG 12/20/14 without acute changes. Continue amiodarone, diltiazem and metoprolol. Xarelto was held for surgery and was resumed 3/22.  3. Asthma: Stable. Continue prior inhaler regimen 4. Chronic hypoxic respiratory failure: On home oxygen 2 L/m. Continue. 5. OSA/OHS on nightly C Pap: Continue C Pap. 6. Uncontrolled type II DM: Recent hypoglycemia probably related to insulins. Hypoglycemia resolved. Continue 70/30 insulin and SSI. 7. Essential hypertension: Controlled on amlodipine, hydralazine, metoprolol and diltiazem. 8. Morbid obesity: Counseled regarding importance of weight loss 9. Chronic diastolic CHF: Compensated 10. Atypical chest pain: EKG and troponins were negative. Likely GI etiology. Had chest pain on night of 3/18. As per primary cardiologist note 06/19/13: too large for cath, CT, nuclear study images is not adequate for dobutamine echo). No recurrence.   TODAY-DAY OF DISCHARGE:  Subjective:   [name] today has no headache,no chest abdominal pain,no new weakness tingling or numbness. Did use to have some pain at the operative site  Objective:   Blood pressure 137/47, pulse 105, temperature 99.4 F (37.4 C), temperature source Oral, resp. rate 18, height 6\' 3"  (1.905 m),  weight 276.694 kg (610 lb), SpO2 94 %.  Intake/Output Summary (Last 24 hours) at 12/23/14 1506 Last data filed at 12/23/14 0930  Gross per 24 hour  Intake 941.17 ml  Output   3650 ml  Net -2708.83 ml   Filed Weights   12/22/14 0451 12/22/14 1633 12/23/14 0522  Weight: 171.006 kg (377 lb) 610 kg (1344 lb 12.9 oz) 276.694 kg (610 lb)    Exam Awake Alert, Oriented *3, No new F.N deficits, Normal affect [place].AT,PERRAL Supple Neck,No JVD, No cervical lymphadenopathy appriciated.  Symmetrical Chest wall movement, Good air movement bilaterally, CTAB RRR,No Gallops,Rubs or new Murmurs, No Parasternal Heave +ve B.Sounds, Abd Soft, Non tender, No organomegaly appriciated, No rebound -guarding or rigidity. No Cyanosis, Clubbing or edema, No new Rash or bruise  DISCHARGE CONDITION: Stable  DISPOSITION: SNF  DISCHARGE INSTRUCTIONS:    Activity:  As tolerated with Full fall precautions use walker/cane & assistance as needed. Weightbearing as tolerated in the left lower extremity. In knee immobilizer.  Diet recommendation: Diabetic Diet Heart Healthy diet   Discharge Instructions    Call MD for:  severe uncontrolled pain    Complete by:  As directed      Diet - low sodium heart healthy    Complete by:  As directed      Diet Carb Modified    Complete by:  As directed      Increase activity slowly    Complete by:  As directed      Weight bearing as tolerated    Complete by:  As directed   Laterality:  left  Extremity:  Lower  In knee immobilizer  Follow-up Information    Follow up with Nestor Lewandowsky, MD. Schedule an appointment as soon as possible for a visit in 2 days.   Specialty:  Orthopedic Surgery   Contact information:   [address] [place] [address] [phone]       Follow up with MURPHY, Jewel Baize, MD In 1 week.   Specialty:  Orthopedic Surgery   Contact information:   [address] [place] [address] [phone]        Follow up with Emeterio Reeve, MD. Schedule an appointment as soon as possible for a visit in 2 weeks.   Specialty:  Family Medicine   Contact information:   [address] [place] [address] [phone]       Total Time spent on discharge equals 45 minutes.  SignedJeoffrey Massed 12/23/2014 3:06 PM

## 2014-12-23 NOTE — Discharge Planning (Signed)
[facility]. Patient updated at bedside.  [facility] Report number: [phone] Transportation: EMS ([address])  Marcelline Deistmily Sherise Geerdes, LCSWA [phone]) Licensed Clinical Social Worker Orthopedics [phone]([id number]) and Surgical [phone]([id number])

## 2014-12-23 NOTE — Progress Notes (Addendum)
Physical Therapy Treatment Patient Details Name: [name] MRN: [id number] DOB: August 28, 1971 Today's Date: 12/23/2014    History of Present Illness Pt with h/o morbid obesity who fell and sustained L patellar tendon rupture. Pt s/p patellar tendon repair 3/21.    PT Comments    Pt with 10/10 pain today however still tolerated ambulating to chair with L KI. Due to patient habitus KI does not fit properly due to unable to fit around thigh. Pt aware to no bend L knee regardless of positioning of KI. Acute PT to con't to follow to progress mobility as able.   Follow Up Recommendations  SNF     Equipment Recommendations  None recommended by PT    Recommendations for Other Services       Precautions / Restrictions Precautions Precautions: Fall Required Braces or Orthoses: Knee Immobilizer - Left Knee Immobilizer - Left: On at all times Restrictions Weight Bearing Restrictions: Yes LLE Weight Bearing: Weight bearing as tolerated    Mobility  Bed Mobility Overal bed mobility: +2 for physical assistance;Needs Assistance Bed Mobility: Supine to Sit     Supine to sit: Mod assist;+2 for physical assistance;HOB elevated     General bed mobility comments: assist for L LE managment, definite use of bed rails  Transfers Overall transfer level: Needs assistance Equipment used: Rolling walker (2 wheeled) Transfers: Sit to/from Stand Sit to Stand: Min assist;+2 physical assistance Stand pivot transfers: Min assist;+2 physical assistance;+2 safety/equipment       General transfer comment: bed extremely elevated, v/c's for hand placement, had to rock back and forth to gain momentum, pushed up from bed, assist to manage L LE  Ambulation/Gait Ambulation/Gait assistance: Min assist;+2 physical assistance Ambulation Distance (Feet): 5 Feet Assistive device: Rolling walker (2 wheeled) Gait Pattern/deviations: Step-to pattern     General Gait Details: pt took 5 steps to chair with  wide base of support and increased support on RW   Stairs            Wheelchair Mobility    Modified Rankin (Stroke Patients Only)       Balance                                    Cognition Arousal/Alertness: Awake/alert Behavior During Therapy: WFL for tasks assessed/performed Overall Cognitive Status: Within Functional Limits for tasks assessed                      Exercises      General Comments        Pertinent Vitals/Pain Pain Assessment: 0-10 Pain Score: 10-Worst pain ever Pain Location: L knee Pain Descriptors / Indicators: Shooting Pain Intervention(s): Monitored during session    Home Living                      Prior Function            PT Goals (current goals can now be found in the care plan section) Progress towards PT goals: Progressing toward goals    Frequency  Min 4X/week    PT Plan Current plan remains appropriate    Co-evaluation             End of Session Equipment Utilized During Treatment: Gait belt;Left knee immobilizer Activity Tolerance: Patient limited by pain Patient left: in chair;with call bell/phone within reach     Time: 1342-1417 PT  Time Calculation (min) (ACUTE ONLY): 35 min  Charges:  $Therapeutic Activity: 23-37 mins                    G Codes:      Marcene BrawnChadwell, Benino Korinek Marie 12/23/2014, 2:53 PM   Lewis ShockAshly Barbar Brede, PT, DPT Pager #: [phone] Office #: [phone]

## 2015-01-08 ENCOUNTER — Ambulatory Visit: Payer: Medicare Other | Admitting: Dietician

## 2015-01-13 ENCOUNTER — Ambulatory Visit: Payer: Medicare Other | Admitting: Pulmonary Disease

## 2015-02-08 ENCOUNTER — Ambulatory Visit: Payer: Medicare Other | Admitting: Dietician

## 2015-03-08 ENCOUNTER — Ambulatory Visit: Payer: Medicare Other | Admitting: Dietician

## 2015-05-07 ENCOUNTER — Telehealth: Payer: Self-pay | Admitting: Cardiology

## 2015-05-07 NOTE — Telephone Encounter (Signed)
Just discharged from rehab Monday post knee surgery.  Patient said that when is Home Health Nurse came out to see him on Wednesday his heart rate was 133 and possibly irreg.  Today with Home Health nurse check it was 61.  Patient could not specifically indicate what medications he had started since his discharge.  [facility] pharmacy called per patient permission.  Script for Norvasc, Dilt-XR 240 mg, and Torpol XL 50 mg were dropped of at the pharmacy 8/1, but have not been picked up yet  Returned call to patient.  He said he hasn't picked them up and started them back yet.   Advised patient to get them picked up and start today.  Instructed to call back if any problems questions or concerns or if is heart rate becomes elevated again or irregular

## 2015-05-07 NOTE — Telephone Encounter (Signed)
New message     Pt says afib is worse---HR 95-138.  Today is the 3rd day HR has been in the 130's.  Pt had knee surgery and has been in rehab for 3 months. Please advise

## 2015-05-09 ENCOUNTER — Encounter (HOSPITAL_COMMUNITY): Payer: Self-pay | Admitting: Emergency Medicine

## 2015-05-09 ENCOUNTER — Inpatient Hospital Stay (HOSPITAL_COMMUNITY)
Admission: EM | Admit: 2015-05-09 | Discharge: 2015-05-11 | DRG: 313 | Disposition: A | Discharge status: Home Health | Payer: Medicare Other | Attending: Internal Medicine | Admitting: Internal Medicine

## 2015-05-09 ENCOUNTER — Emergency Department (HOSPITAL_COMMUNITY): Payer: Medicare Other

## 2015-05-09 DIAGNOSIS — Z6841 Body Mass Index (BMI) 40.0 and over, adult: Secondary | ICD-10-CM | POA: Diagnosis not present

## 2015-05-09 DIAGNOSIS — I4891 Unspecified atrial fibrillation: Secondary | ICD-10-CM | POA: Diagnosis present

## 2015-05-09 DIAGNOSIS — E662 Morbid (severe) obesity with alveolar hypoventilation: Secondary | ICD-10-CM | POA: Diagnosis not present

## 2015-05-09 DIAGNOSIS — R079 Chest pain, unspecified: Secondary | ICD-10-CM | POA: Diagnosis present

## 2015-05-09 DIAGNOSIS — Z8249 Family history of ischemic heart disease and other diseases of the circulatory system: Secondary | ICD-10-CM

## 2015-05-09 DIAGNOSIS — J4530 Mild persistent asthma, uncomplicated: Secondary | ICD-10-CM | POA: Diagnosis not present

## 2015-05-09 DIAGNOSIS — Z91013 Allergy to seafood: Secondary | ICD-10-CM | POA: Diagnosis not present

## 2015-05-09 DIAGNOSIS — Z888 Allergy status to other drugs, medicaments and biological substances status: Secondary | ICD-10-CM

## 2015-05-09 DIAGNOSIS — F329 Major depressive disorder, single episode, unspecified: Secondary | ICD-10-CM | POA: Diagnosis not present

## 2015-05-09 DIAGNOSIS — G8929 Other chronic pain: Secondary | ICD-10-CM | POA: Diagnosis not present

## 2015-05-09 DIAGNOSIS — R072 Precordial pain: Secondary | ICD-10-CM | POA: Diagnosis not present

## 2015-05-09 DIAGNOSIS — I482 Chronic atrial fibrillation: Secondary | ICD-10-CM | POA: Diagnosis not present

## 2015-05-09 DIAGNOSIS — N179 Acute kidney failure, unspecified: Secondary | ICD-10-CM | POA: Diagnosis present

## 2015-05-09 DIAGNOSIS — Z79899 Other long term (current) drug therapy: Secondary | ICD-10-CM

## 2015-05-09 DIAGNOSIS — I5032 Chronic diastolic (congestive) heart failure: Secondary | ICD-10-CM | POA: Diagnosis not present

## 2015-05-09 DIAGNOSIS — I10 Essential (primary) hypertension: Secondary | ICD-10-CM | POA: Diagnosis present

## 2015-05-09 DIAGNOSIS — J9612 Chronic respiratory failure with hypercapnia: Secondary | ICD-10-CM | POA: Diagnosis present

## 2015-05-09 DIAGNOSIS — Z9981 Dependence on supplemental oxygen: Secondary | ICD-10-CM

## 2015-05-09 DIAGNOSIS — G4733 Obstructive sleep apnea (adult) (pediatric): Secondary | ICD-10-CM | POA: Diagnosis present

## 2015-05-09 DIAGNOSIS — I110 Hypertensive heart disease with heart failure: Secondary | ICD-10-CM | POA: Diagnosis not present

## 2015-05-09 DIAGNOSIS — J449 Chronic obstructive pulmonary disease, unspecified: Secondary | ICD-10-CM | POA: Diagnosis not present

## 2015-05-09 DIAGNOSIS — Z794 Long term (current) use of insulin: Secondary | ICD-10-CM | POA: Diagnosis not present

## 2015-05-09 DIAGNOSIS — J45909 Unspecified asthma, uncomplicated: Secondary | ICD-10-CM | POA: Diagnosis present

## 2015-05-09 DIAGNOSIS — E119 Type 2 diabetes mellitus without complications: Secondary | ICD-10-CM | POA: Diagnosis present

## 2015-05-09 DIAGNOSIS — R0789 Other chest pain: Principal | ICD-10-CM | POA: Diagnosis present

## 2015-05-09 DIAGNOSIS — Z7901 Long term (current) use of anticoagulants: Secondary | ICD-10-CM | POA: Diagnosis not present

## 2015-05-09 LAB — CBG MONITORING, ED: GLUCOSE-CAPILLARY: 292 mg/dL — AB (ref 65–99)

## 2015-05-09 LAB — BRAIN NATRIURETIC PEPTIDE: B NATRIURETIC PEPTIDE 5: 57.7 pg/mL (ref 0.0–100.0)

## 2015-05-09 LAB — D-DIMER, QUANTITATIVE: D-Dimer, Quant: 0.4 ug/mL-FEU (ref 0.00–0.48)

## 2015-05-09 LAB — CBC WITH DIFFERENTIAL/PLATELET
Basophils Absolute: 0 10*3/uL (ref 0.0–0.1)
Basophils Relative: 0 % (ref 0–1)
EOS ABS: 0.2 10*3/uL (ref 0.0–0.7)
Eosinophils Relative: 2 % (ref 0–5)
HCT: 42.2 % (ref 39.0–52.0)
Hemoglobin: 14.3 g/dL (ref 13.0–17.0)
LYMPHS PCT: 15 % (ref 12–46)
Lymphs Abs: 1.8 10*3/uL (ref 0.7–4.0)
MCH: 28.3 pg (ref 26.0–34.0)
MCHC: 33.9 g/dL (ref 30.0–36.0)
MCV: 83.6 fL (ref 78.0–100.0)
MONOS PCT: 8 % (ref 3–12)
Monocytes Absolute: 1 10*3/uL (ref 0.1–1.0)
NEUTROS ABS: 9.2 10*3/uL — AB (ref 1.7–7.7)
NEUTROS PCT: 75 % (ref 43–77)
Platelets: 194 10*3/uL (ref 150–400)
RBC: 5.05 MIL/uL (ref 4.22–5.81)
RDW: 16.1 % — AB (ref 11.5–15.5)
WBC: 12.2 10*3/uL — ABNORMAL HIGH (ref 4.0–10.5)

## 2015-05-09 LAB — APTT: APTT: 37 s (ref 24–37)

## 2015-05-09 LAB — I-STAT TROPONIN, ED: TROPONIN I, POC: 0 ng/mL (ref 0.00–0.08)

## 2015-05-09 LAB — BASIC METABOLIC PANEL
ANION GAP: 15 (ref 5–15)
BUN: 13 mg/dL (ref 6–20)
CO2: 22 mmol/L (ref 22–32)
Calcium: 9.1 mg/dL (ref 8.9–10.3)
Chloride: 101 mmol/L (ref 101–111)
Creatinine, Ser: 1.54 mg/dL — ABNORMAL HIGH (ref 0.61–1.24)
GFR calc Af Amer: >60 mL/min (ref >60)
GFR calc non Af Amer: 53 mL/min — ABNORMAL LOW (ref >60)
Glucose, Bld: 287 mg/dL — ABNORMAL HIGH (ref 65–99)
Potassium: 4.3 mmol/L (ref 3.5–5.1)
Sodium: 138 mmol/L (ref 135–145)

## 2015-05-09 LAB — PROTIME-INR
INR: 1.68 — ABNORMAL HIGH (ref 0.00–1.49)
Prothrombin Time: 19.8 seconds — ABNORMAL HIGH (ref 11.6–15.2)

## 2015-05-09 LAB — TROPONIN I

## 2015-05-09 MED ORDER — ONDANSETRON HCL 4 MG/2ML IJ SOLN: 4.0000 mg | Freq: Four times a day (QID) | INTRAMUSCULAR | Status: DC | PRN

## 2015-05-09 MED ORDER — ALPRAZOLAM 0.5 MG PO TABS: 0.5000 mg | ORAL_TABLET | Freq: Two times a day (BID) | ORAL | Status: DC | PRN

## 2015-05-09 MED ORDER — MORPHINE SULFATE 2 MG/ML IJ SOLN
2.0000 mg | INTRAMUSCULAR | Status: DC | PRN
  Administered 2015-05-09 – 2015-05-11 (×5): 2 mg via INTRAVENOUS
  Filled 2015-05-09 (×5): qty 1

## 2015-05-09 MED ORDER — FUROSEMIDE 40 MG PO TABS
40.0000 mg | ORAL_TABLET | Freq: Every day | ORAL | Status: DC
  Administered 2015-05-10 – 2015-05-11 (×2): 40 mg via ORAL
  Filled 2015-05-09: qty 2
  Filled 2015-05-09: qty 1

## 2015-05-09 MED ORDER — FUROSEMIDE 10 MG/ML IJ SOLN
40.0000 mg | Freq: Once | INTRAMUSCULAR | Status: AC
  Administered 2015-05-09: 40 mg via INTRAVENOUS
  Filled 2015-05-09: qty 4

## 2015-05-09 MED ORDER — CYCLOBENZAPRINE HCL 10 MG PO TABS: 10.0000 mg | ORAL_TABLET | Freq: Three times a day (TID) | ORAL | Status: DC | PRN

## 2015-05-09 MED ORDER — MORPHINE SULFATE 4 MG/ML IJ SOLN
4.0000 mg | Freq: Once | INTRAMUSCULAR | Status: AC
  Administered 2015-05-09: 4 mg via INTRAVENOUS
  Filled 2015-05-09: qty 1

## 2015-05-09 MED ORDER — NITROGLYCERIN 0.4 MG SL SUBL: 0.4000 mg | SUBLINGUAL_TABLET | SUBLINGUAL | Status: DC | PRN

## 2015-05-09 MED ORDER — ONDANSETRON HCL 4 MG/2ML IJ SOLN
4.0000 mg | Freq: Once | INTRAMUSCULAR | Status: AC
  Administered 2015-05-09: 4 mg via INTRAVENOUS
  Filled 2015-05-09: qty 2

## 2015-05-09 MED ORDER — ACETAMINOPHEN 325 MG PO TABS: 650.0000 mg | ORAL_TABLET | ORAL | Status: DC | PRN

## 2015-05-09 MED ORDER — RIVAROXABAN 20 MG PO TABS
20.0000 mg | ORAL_TABLET | Freq: Every day | ORAL | Status: DC
  Administered 2015-05-10 – 2015-05-11 (×2): 20 mg via ORAL
  Filled 2015-05-09 (×3): qty 1

## 2015-05-09 MED ORDER — INSULIN ASPART 100 UNIT/ML ~~LOC~~ SOLN: 0.0000 [IU] | Freq: Every day | SUBCUTANEOUS | Status: DC

## 2015-05-09 MED ORDER — OXYCODONE-ACETAMINOPHEN 5-325 MG PO TABS: 1.0000 | ORAL_TABLET | ORAL | Status: DC | PRN

## 2015-05-09 MED ORDER — DILTIAZEM HCL ER 240 MG PO CP24
240.0000 mg | ORAL_CAPSULE | Freq: Every day | ORAL | Status: DC
  Administered 2015-05-10 – 2015-05-11 (×2): 240 mg via ORAL
  Filled 2015-05-09 (×3): qty 1

## 2015-05-09 MED ORDER — LAMOTRIGINE 25 MG PO TABS
25.0000 mg | ORAL_TABLET | Freq: Every day | ORAL | Status: DC
  Administered 2015-05-10 (×2): 25 mg via ORAL
  Filled 2015-05-09 (×4): qty 1

## 2015-05-09 MED ORDER — AMLODIPINE BESYLATE 5 MG PO TABS: 10.0000 mg | ORAL_TABLET | Freq: Every day | ORAL | Status: DC

## 2015-05-09 MED ORDER — METOPROLOL SUCCINATE ER 50 MG PO TB24
50.0000 mg | ORAL_TABLET | Freq: Every day | ORAL | Status: DC
  Administered 2015-05-09 – 2015-05-11 (×3): 50 mg via ORAL
  Filled 2015-05-09: qty 1
  Filled 2015-05-09 (×2): qty 2

## 2015-05-09 MED ORDER — AMIODARONE HCL 200 MG PO TABS
200.0000 mg | ORAL_TABLET | Freq: Every day | ORAL | Status: DC
  Administered 2015-05-10 – 2015-05-11 (×2): 200 mg via ORAL
  Filled 2015-05-09 (×2): qty 1

## 2015-05-09 MED ORDER — INSULIN ASPART 100 UNIT/ML ~~LOC~~ SOLN
0.0000 [IU] | Freq: Three times a day (TID) | SUBCUTANEOUS | Status: DC
  Administered 2015-05-09: 8 [IU] via SUBCUTANEOUS
  Administered 2015-05-10: 2 [IU] via SUBCUTANEOUS
  Administered 2015-05-10 – 2015-05-11 (×4): 3 [IU] via SUBCUTANEOUS
  Filled 2015-05-09 (×2): qty 1

## 2015-05-09 MED ORDER — DIVALPROEX SODIUM 500 MG PO DR TAB
1000.0000 mg | DELAYED_RELEASE_TABLET | Freq: Every day | ORAL | Status: DC
  Administered 2015-05-09 – 2015-05-11 (×3): 1000 mg via ORAL
  Filled 2015-05-09 (×2): qty 4
  Filled 2015-05-09: qty 2

## 2015-05-09 MED ORDER — BUPROPION HCL ER (XL) 150 MG PO TB24
300.0000 mg | ORAL_TABLET | Freq: Every day | ORAL | Status: DC
  Administered 2015-05-10 – 2015-05-11 (×2): 300 mg via ORAL
  Filled 2015-05-09 (×2): qty 1
  Filled 2015-05-09: qty 2

## 2015-05-09 MED ORDER — INSULIN ASPART PROT & ASPART (70-30 MIX) 100 UNIT/ML ~~LOC~~ SUSP
30.0000 [IU] | Freq: Two times a day (BID) | SUBCUTANEOUS | Status: DC
  Administered 2015-05-10 – 2015-05-11 (×3): 30 [IU] via SUBCUTANEOUS
  Filled 2015-05-09 (×2): qty 10

## 2015-05-09 NOTE — Progress Notes (Signed)
xarelto per pharmacy for afib.  ANTICOAGULATION CONSULT NOTE - Initial Consult  Pharmacy Consult for xarelto Indication: atrial fibrillation  Allergies  Allergen Reactions  . Iodine Anaphylaxis and Other (See Comments)    Associated with the shellfish allergy  . Shellfish Allergy Anaphylaxis    Patient Measurements: Weight: (!) 588 lb (266.715 kg)  Vital Signs: Temp: 98.5 F (36.9 C) (08/07 1914) Temp Source: Oral (08/07 1914) BP: 147/76 mmHg (08/07 2100) Pulse Rate: 82 (08/07 2100)  Labs:  Recent Labs  05/09/15 1710  HGB 14.3  HCT 42.2  PLT 194  CREATININE 1.54*    Estimated Creatinine Clearance: 136.3 mL/min (by C-G formula based on Cr of 1.54).   Medical History: Past Medical History  Diagnosis Date  . CHF (congestive heart failure)     EF55-60%  . HTN (hypertension)   . Ptosis   . Atrial flutter   . Atrial fibrillation   . Morbid obesity   . DM2 (diabetes mellitus, type 2)   . Depression   . Chronic low back pain   . Asthma   . Vision abnormalities   . Abscess     right thigh  . Sleep apnea   . Chronic respiratory failure   . Obesity hypoventilation syndrome     Assessment: 43 YOM on xarelto PTA for afib, presented with SOB, chest pain. Pharmacy to resume xarelto. Pt. Takes 20 mg daily with breakfast. CBC wnl. Noted scr 1.54,  elevated from baseline, with est. crcl still > 60 ml/min  He took a dose this morning.  Goal of Therapy:  Monitor platelets by anticoagulation protocol: Yes   Plan:  Continue xarelto 20 mg daily Monitor renal function  Bayard Hugger, PharmD, BCPS  Clinical Pharmacist  Pager: [phone]   05/09/2015,10:24 PM

## 2015-05-09 NOTE — ED Notes (Signed)
Received pt from home with c/o called 911 for shortness of breath. Pt reports that he had chest pain earlier. Pt given 324 MG of ASA, 10 of albuterol, 5 of atrovent by EMS.

## 2015-05-09 NOTE — ED Notes (Signed)
Assisted pt with removing shoes. Pt states he would like water. Advised he is not allowed to have anything at this time.

## 2015-05-09 NOTE — ED Provider Notes (Signed)
History   Chief Complaint  Patient presents with  . Shortness of Breath  . Chest Pain    HPI 44 year old male with past medical history as below notable for CHF, hypertension, atrial fibrillation on Xarelto, morbid obesity, diabetes, no known coronary artery disease who presents to ED for evaluation of chest pain shortness of breath which began today. Patient reports he woke up this morning around 4 AM with mild substernal chest pressure and tightness. He reports having some shortness of breath at rest at that time which progressively worsened throughout the day. He notes he has not been out of the house for several days until today which exacerbated his symptoms. He reports becoming diaphoretic and having severe respiratory distress. EMS was called and on their arrival the new patient to be wheezing and gave patient albuterol and Atrovent. Patient reports improvement in his symptoms after that. He reports his symptoms today felt like asthma but was much worse than it typically feels. Patient also notes he is on Lasix and has had slight increase in his leg swelling but has not had any leg pain. Denies history of DVT/PE. He is taking all his medications including his a relative as instructed. He does report having a cough which is been mildly productive over the last 1 week and subjective fevers and chills. Denies any nausea, vomiting, abdominal pain, other symptoms. Modifying factors exertion worsens.  Severity: moderate.  Associated symptoms: as above.  Hx of similar symptoms: yes.    Past medical/surgical history, social history, medications, allergies and FH have been reviewed with patient and/or in documentation. Furthermore, if pt family or friend(s) present, additional historical information was obtained from them.  Past Medical History  Diagnosis Date  . CHF (congestive heart failure)     EF55-60%  . HTN (hypertension)   . Ptosis   . Atrial flutter   . Atrial fibrillation   . Morbid  obesity   . DM2 (diabetes mellitus, type 2)   . Depression   . Chronic low back pain   . Asthma   . Vision abnormalities   . Abscess     right thigh  . Sleep apnea   . Chronic respiratory failure   . Obesity hypoventilation syndrome    Past Surgical History  Procedure Laterality Date  . Tonsillectomy    . Cholecystectomy    . Tonsillectomy    . Irrigation and debridement abscess  10/13/2011    Procedure: IRRIGATION AND DEBRIDEMENT ABSCESS;  Surgeon: Emelia Loron, MD;  Location: [facility] OR;  Service: General;  Laterality: Right;  Irrigation and debridement right thigh abscess  . Application of wound vac      right thigh abscess  . Breath tek h pylori N/A 10/06/2014    Procedure: BREATH TEK H PYLORI;  Surgeon: Valarie Merino, MD;  Location: [facility];  Service: General;  Laterality: N/A;  . Patellar tendon repair Left 12/21/2014    Procedure: PATELLA TENDON REPAIR;  Surgeon: Sheral Apley, MD;  Location: [facility] OR;  Service: Orthopedics;  Laterality: Left;   Family History  Problem Relation Age of Onset  . Coronary artery disease Mother     25s; father had it in 45s; both deceased in their 69s    . Cancer Mother     liver  . Heart disease Father   . Cancer Father     prostate   History  Substance Use Topics  . Smoking status: Never Smoker   . Smokeless tobacco: Not on file  Comment: no significant tobacco use   . Alcohol Use: 0.6 oz/week    1 Standard drinks or equivalent per week     Comment: no significant use-- rare     Review of Systems Constitutional: - F/C, -fatigue. + diaphoresis HENT: - congestion, -rhinorrhea, -sore throat.   Eyes: - eye pain, -visual disturbance.  Respiratory: + cough, +SOB, -hemoptysis.   Cardiovascular: + CP, +palps.  Gastrointestinal: - N/V/D, -abd pain  Genitourinary: - flank pain, -dysuria, -frequency.  Musculoskeletal: - myalgia/arthritis, -joint swelling, -gait abnormality, -back pain, -neck pain/stiffness, -leg pain/swelling.   Skin: - rash/lesion.  Neurological: - focal weakness, -lightheadedness, -dizziness, -numbness, -HA.  All other systems reviewed and are negative.   Physical Exam  Physical Exam  ED Triage Vitals  Enc Vitals Group     BP 05/09/15 1656 123/71 mmHg     Pulse Rate 05/09/15 1656 94     Resp 05/09/15 1656 25     Temp 05/09/15 1652 98.5 F (36.9 C)     Temp Source 05/09/15 1652 Oral     SpO2 05/09/15 1648 97 %     Weight 05/09/15 1652 588 lb (266.715 kg)     Height --      Head Cir --      Peak Flow --      Pain Score --      Pain Loc --      Pain Edu? --      Excl. in GC? --    Constitutional: morbidly obese male, diaphoretic, in resp distress Head: Normocephalic and atraumatic.  Eyes: Extraocular motion intact, no scleral icterus Mouth: MMM, OP clear Neck: Supple without meningismus, mass, or overt JVD Respiratory: No respiratory distress. Normal WOB. No w/r/g. CV: RRR, no obvious murmurs.  Pulses +2 and symmetric. Euvolemic Abdomen: Soft, NT, ND, no r/g. No mass.  MSK: Extremities are atraumatic without deformity, ROM intact Skin: Warm, dry, intact without rash Neuro: AAOx4, MAE 5/5 sym, no focal deficit noted   ED Course  Procedures   Labs Reviewed  BASIC METABOLIC PANEL - Abnormal; Notable for the following:    Glucose, Bld 287 (*)    Creatinine, Ser 1.54 (*)    GFR calc non Af Amer 53 (*)    All other components within normal limits  CBC WITH DIFFERENTIAL/PLATELET - Abnormal; Notable for the following:    WBC 12.2 (*)    RDW 16.1 (*)    Neutro Abs 9.2 (*)    All other components within normal limits  PROTIME-INR - Abnormal; Notable for the following:    Prothrombin Time 19.8 (*)    INR 1.68 (*)    All other components within normal limits  CBG MONITORING, ED - Abnormal; Notable for the following:    Glucose-Capillary 292 (*)    All other components within normal limits  CULTURE, EXPECTORATED SPUTUM-ASSESSMENT  BRAIN NATRIURETIC PEPTIDE  D-DIMER,  QUANTITATIVE (NOT AT [facility])  TROPONIN I  APTT  HEMOGLOBIN A1C  TROPONIN I  TROPONIN I  STREP PNEUMONIAE URINARY ANTIGEN  LEGIONELLA ANTIGEN, URINE  I-STAT TROPOININ, ED   I personally reviewed and interpreted all labs.  Dg Chest Port 1 View  05/09/2015   CLINICAL DATA:  Eval PNA, edema. Hx HTN, diabetes, CHF, COPD, asthma, and bronchitis.  EXAM: PORTABLE CHEST - 1 VIEW  COMPARISON:  11/28/2014  FINDINGS: Cardiac silhouette is mildly enlarged. No mediastinal or hilar masses or convincing adenopathy.  Hazy opacity projects throughout the right hemi thorax may be from  the overlying soft tissues. There is right-sided vascular congestion. There are no areas of focal lung consolidation. No convincing pleural effusion and no pneumothorax.  IMPRESSION: 1. Right-sided vascular congestion mild hazy right hemi thorax opacity. There is mild cardiomegaly. Findings suggest asymmetric pulmonary edema. No convincing pneumonia.   Electronically Signed   By: Amie Portland M.D.   On: 05/09/2015 17:27   I personally viewed above image(s) which were used in my medical decision making. Formal interpretations by Radiology.   EKG Interpretation  Date/Time:  Sunday May 09 2015 16:50:15 EDT Ventricular Rate:  96 PR Interval:  195 QRS Duration: 94 QT Interval:  326 QTC Calculation: 412 R Axis:   172 Text Interpretation:  Right and left arm electrode reversal, interpretation assumes no reversal Sinus rhythm Low voltage with right axis deviation Consider anterolateral infarct ST elevation, consider inferior injury When compared with ECG of 12/20/2014, No significant change was found Confirmed by [facility]  MD, DAVID ([id number]) on 05/09/2015 5:01:56 PM       MDM: [name] is a 44 y.o. male with H&P as above who p/w CC:  Chest pain  Patient is hemodynamically stable and in mild respiratory distress on arrival. Patient appears diaphoretic and is with increased work of breathing. Decreased breath sounds secondary to  habitus and no signs of wheezing currently. Clinical picture is concerning for ACS, PE, CHF, pneumonia.  Screening labs, EKG, chest x-ray ordered EKG shows sinus rhythm, and limb lead reversal. Repeat EKG done and shows atrial flutter and no signs of acute ischemia. Patient has had QTC of 512. No ST elevation or depression noted.  Workup notable for negative d-dimer and asymmetric pulmonary edema on x-ray without signs of pneumonia. Troponin is undetectable.  Patient still reporting chest pain and difficult breathing. He is on 2 L oxygen. Patient will be admitted to stepdown for further management of his care.  Old records reviewed (if available). Labs and imaging reviewed personally by myself and considered in medical decision making if ordered.  Clinical Impression: 1. Chest pain, rule out acute myocardial infarction     Disposition: Admit  Condition: stable  I have discussed the results, Dx and Tx plan with the pt(& family if present). He/she/they expressed understanding and agree(s) with the plan.  Pt seen in conjunction with Dr. Dione Booze, MD  Ames Dura, DO [facility] Emergency Medicine Resident - PGY-3    Ames Dura, MD 05/10/15 Marcie Bal  Dione Booze, MD 05/10/15 2251

## 2015-05-09 NOTE — ED Notes (Signed)
MD at bedside. 

## 2015-05-09 NOTE — H&P (Addendum)
[facility] History and Physical  Patient: [name]  MRN: [id number]  DOB: 12/16/1970  DOS: the patient was seen and examined on 05/09/2015 PCP: Emeterio Reeve, MD  Referring physician: Dr. Preston Fleeting Chief Complaint: Chest pain  HPI: [name] is a 44 y.o. male with Past medical history of morbid obesity, afibrillation, diabetes mellitus, chronic diastolic dysfunction, essential hypertension, chronic anticoagulation, sleep apnea. The patient is presenting with complaints of chest pain. Patient was recently in the nursing home and discharged on week ago. The patient mentions that due to his knee pain. He has not been out of his house more frequently in the last few weeks. After one week he has run out of the house and while he was walking out suddenly started having complaints of shortness of breath this was followed by central chest pain which was sharp and stabbing this was followed by diaphoresis and dizziness and lightheadedness as well as nausea and episode of vomiting at which time they called EMS to bring the patient to the hospital. At the time of my evaluation the patient continues to have substernal chest pain which is sharp and stabbing worsening with movement worsening with deep breath worsening with pressure. He denies any fever or chills denies any cough. Denies any diarrhea or constipation or abdominal pain or acid reflux. He has multiple changes in his medications reported recently.  The patient is coming from home.  At his baseline ambulates with walker And is independent for most of his ADL manages her medication on his own.  Review of Systems: as mentioned in the history of present illness.  A comprehensive review of the other systems is negative.  Past Medical History  Diagnosis Date  . CHF (congestive heart failure)     EF55-60%  . HTN (hypertension)   . Ptosis   . Atrial flutter   . Atrial fibrillation   . Morbid obesity   . DM2 (diabetes mellitus,  type 2)   . Depression   . Chronic low back pain   . Asthma   . Vision abnormalities   . Abscess     right thigh  . Sleep apnea   . Chronic respiratory failure   . Obesity hypoventilation syndrome    Past Surgical History  Procedure Laterality Date  . Tonsillectomy    . Cholecystectomy    . Tonsillectomy    . Irrigation and debridement abscess  10/13/2011    Procedure: IRRIGATION AND DEBRIDEMENT ABSCESS;  Surgeon: Emelia Loron, MD;  Location: [facility] OR;  Service: General;  Laterality: Right;  Irrigation and debridement right thigh abscess  . Application of wound vac      right thigh abscess  . Breath tek h pylori N/A 10/06/2014    Procedure: BREATH TEK H PYLORI;  Surgeon: Valarie Merino, MD;  Location: [facility];  Service: General;  Laterality: N/A;  . Patellar tendon repair Left 12/21/2014    Procedure: PATELLA TENDON REPAIR;  Surgeon: Sheral Apley, MD;  Location: [facility] OR;  Service: Orthopedics;  Laterality: Left;   Social History:  reports that he has never smoked. He does not have any smokeless tobacco history on file. He reports that he drinks about 0.6 oz of alcohol per week. He reports that he does not use illicit drugs.  Allergies  Allergen Reactions  . Iodine Anaphylaxis and Other (See Comments)    Associated with the shellfish allergy  . Shellfish Allergy Anaphylaxis    Family History  Problem Relation Age of  Onset  . Coronary artery disease Mother     68s; father had it in 37s; both deceased in their 85s    . Cancer Mother     liver  . Heart disease Father   . Cancer Father     prostate    Prior to Admission medications   Medication Sig Start Date End Date Taking? Authorizing Provider  albuterol (VENTOLIN HFA) 108 (90 BASE) MCG/ACT inhaler Inhale 2 puffs into the lungs every 6 (six) hours as needed. Shortness of breath Patient taking differently: Inhale 2 puffs into the lungs every 6 (six) hours as needed for wheezing or shortness of breath.  03/30/14  Yes  Alison Murray, MD  ALPRAZolam Prudy Feeler) 0.5 MG tablet Take 1 tablet (0.5 mg total) by mouth daily as needed for anxiety. 12/23/14  Yes Shanker Levora Dredge, MD  amiodarone (PACERONE) 200 MG tablet Take 200 mg by mouth daily.   Yes Historical Provider, MD  buPROPion (WELLBUTRIN XL) 300 MG 24 hr tablet Take 300 mg by mouth Daily.  03/13/12  Yes Historical Provider, MD  cyclobenzaprine (FLEXERIL) 10 MG tablet Take 10 mg by mouth 3 (three) times daily as needed for muscle spasms.   Yes Historical Provider, MD  diltiazem (DILACOR XR) 240 MG 24 hr capsule Take 240 mg by mouth daily.   Yes Historical Provider, MD  divalproex (DEPAKOTE) 500 MG DR tablet Take 1,000 mg by mouth daily.    Yes Historical Provider, MD  furosemide (LASIX) 40 MG tablet Take 40 mg by mouth daily.   Yes Historical Provider, MD  hydrALAZINE (APRESOLINE) 10 MG tablet Take 1 tablet (10 mg total) by mouth every 8 (eight) hours. 03/30/14  Yes Alison Murray, MD  insulin lispro protamine-lispro (HUMALOG 75/25 MIX) (75-25) 100 UNIT/ML SUSP injection Inject 40-75 Units into the skin 3 (three) times daily before meals. Inject 75 units with breakfast, 40 units with lunch, 75 units with supper   Yes Historical Provider, MD  lamoTRIgine (LAMICTAL) 25 MG tablet Take 25 mg by mouth at bedtime.    Yes Historical Provider, MD  metoprolol (TOPROL-XL) 50 MG 24 hr tablet Take 50 mg by mouth daily. 05/10/11  Yes Lewayne Bunting, MD  Naftifine HCl (NAFTIN) 2 % CREA Apply 1 application topically daily. Apply to feet.   Yes Historical Provider, MD  nitroGLYCERIN (NITROSTAT) 0.4 MG SL tablet Place 0.4 mg under the tongue every 5 (five) minutes as needed for chest pain.    Yes Historical Provider, MD  oxyCODONE-acetaminophen (PERCOCET) 5-325 MG per tablet Take 1-2 tablets by mouth every 4 (four) hours as needed for severe pain. Patient taking differently: Take 1 tablet by mouth daily as needed for severe pain.  12/23/14  Yes Shanker Levora Dredge, MD  polyethylene glycol  (MIRALAX / GLYCOLAX) packet Take 17 g by mouth daily as needed (constipation).   Yes Historical Provider, MD  ramelteon (ROZEREM) 8 MG tablet Take 8 mg by mouth at bedtime as needed for sleep.   Yes Historical Provider, MD  rivaroxaban (XARELTO) 20 MG TABS tablet Take 1 tablet (20 mg total) by mouth daily with supper. Patient taking differently: Take 20 mg by mouth daily with breakfast.  06/26/14  Yes Simonne Martinet, NP  insulin aspart (NOVOLOG) 100 UNIT/ML injection Sliding scale   CBG 70 - 120: 0 units:  CBG 121 - 150: 2 units;  CBG 151 - 200: 3 units;  CBG 201 - 250: 5 units;  CBG 251 - 300: 8  units; CBG 301 - 350: 11 units; CBG 351 - 400: 15 units;   Use while on steroids/prednisone. Patient not taking: Reported on 05/09/2015 11/29/14   Nita Sells Mikhail, DO  insulin aspart protamine- aspart (NOVOLOG MIX 70/30) (70-30) 100 UNIT/ML injection Inject 0.55 mLs (55 Units total) into the skin 2 (two) times daily with a meal. Patient not taking: Reported on 05/09/2015 12/23/14   Maretta Bees, MD  methocarbamol (ROBAXIN) 500 MG tablet Take 1 tablet (500 mg total) by mouth 4 (four) times daily. Patient not taking: Reported on 05/09/2015 12/23/14   Maretta Bees, MD  senna (SENOKOT) 8.6 MG TABS tablet Take 1 tablet (8.6 mg total) by mouth 2 (two) times daily. Patient not taking: Reported on 05/09/2015 12/23/14   Maretta Bees, MD    Physical Exam: Filed Vitals:   05/09/15 2100 05/09/15 2200 05/09/15 2230 05/09/15 2300  BP: 147/76 124/89    Pulse: 82 82 81 82  Temp:      TempSrc:      Resp: 20 18 17 20   Weight:      SpO2: 92% 95% 95% 96%    General: Alert, Awake and Oriented to Time, Place and Person. Appear in mild distress Eyes: PERRL ENT: Oral Mucosa clear moist. Neck: no JVD Cardiovascular: S1 and S2 Present, no Murmur, Peripheral Pulses Present Respiratory: Bilateral Air entry equal and Decreased,  Clear to Auscultation, no Crackles, no wheezes Abdomen: Bowel Sound present,  Soft and non tender Skin: no Rash Extremities: Bilateral Pedal edema, no calf tenderness Neurologic: Grossly no focal neuro deficit.  Labs on Admission:  CBC:  Recent Labs Lab 05/09/15 1710  WBC 12.2*  NEUTROABS 9.2*  HGB 14.3  HCT 42.2  MCV 83.6  PLT 194    CMP     Component Value Date/Time   NA 138 05/09/2015 1710   K 4.3 05/09/2015 1710   CL 101 05/09/2015 1710   CO2 22 05/09/2015 1710   GLUCOSE 287* 05/09/2015 1710   BUN 13 05/09/2015 1710   CREATININE 1.54* 05/09/2015 1710   CALCIUM 9.1 05/09/2015 1710   PROT 6.0 12/17/2014 0440   ALBUMIN 3.2* 12/17/2014 0440   AST 17 12/17/2014 0440   ALT 12 12/17/2014 0440   ALKPHOS 43 12/17/2014 0440   BILITOT 0.9 12/17/2014 0440   GFRNONAA 53* 05/09/2015 1710   GFRAA >60 05/09/2015 1710    No results for input(s): LIPASE, AMYLASE in the last 168 hours.   Recent Labs Lab 05/09/15 2256  TROPONINI <0.03   BNP (last 3 results)  Recent Labs  11/26/14 1628 05/09/15 1710  BNP 81.8 57.7    ProBNP (last 3 results) No results for input(s): PROBNP in the last 8760 hours.   Radiological Exams on Admission: Dg Chest Port 1 View  05/09/2015   CLINICAL DATA:  Eval PNA, edema. Hx HTN, diabetes, CHF, COPD, asthma, and bronchitis.  EXAM: PORTABLE CHEST - 1 VIEW  COMPARISON:  11/28/2014  FINDINGS: Cardiac silhouette is mildly enlarged. No mediastinal or hilar masses or convincing adenopathy.  Hazy opacity projects throughout the right hemi thorax may be from the overlying soft tissues. There is right-sided vascular congestion. There are no areas of focal lung consolidation. No convincing pleural effusion and no pneumothorax.  IMPRESSION: 1. Right-sided vascular congestion mild hazy right hemi thorax opacity. There is mild cardiomegaly. Findings suggest asymmetric pulmonary edema. No convincing pneumonia.   Electronically Signed   By: Amie Portland M.D.   On: 05/09/2015 17:27  EKG: Independently reviewed. normal sinus rhythm,  nonspecific ST and T waves changes.  Assessment/Plan Principal Problem:   Chest pain Active Problems:   Morbid obesity   Long term current use of anticoagulant   OSA (obstructive sleep apnea)   A-fib   Asthma   Essential hypertension   1. Chest pain The patient is presenting with complaints of chest pain. His initial EKG was showing limb lead reversal with a repeat EKG does not show any acute abnormality. Troponins are negative. D-dimer is also negative. The patient is already on Xarelto. Patient pain appears to be musculoskeletal although possibility of ACS cannot be ruled out. Therefore the patient will be admitted in the hospital. Next and we will monitor him on serial troponin. Echocardiogram in the morning. Continue with Xarelto. Continue with aspirin.  2. Essential hypertension. The patient mentions he is taking amlodipine as well as Cardizem. At present I would discontinue amlodipine and continue rest of the other medications.  3. Obstructive sleep apnea. Continue C Pap daily at bedtime.  4. Atrial fibrillation. Continuing home medication as well as continuing anticoagulation.  5. Diabetes mellitus. Reducing home insulin dose to 30 mg twice a day in the setting of nothing by mouth and placing him on sliding scale.  6. Mood disorder. The patient has not been taking his Depakote as well as lamotrigine and last 1 week. I will resume them. Patient was requested not to discontinue this medication again in future without talking with his PCP.  7. Possible acute on chronic diastolic dysfunction. I will give him 40 mg of IV Lasix 1 and monitor ins and outs.  Advance goals of care discussion: Full code   DVT Prophylaxis: Chronic anticoagulation Nutrition: Nothing by mouth after midnight  Family Communication: family was present at bedside, opportunity was given to ask question and all questions were answered satisfactorily at the time of interview. Disposition:  Admitted as observation, step-down unit.  Author: Lynden Oxford, MD [facility] Pager: [phone] 05/09/2015  If 7PM-7AM, please contact night-coverage [email or web address] Password [email or web address]

## 2015-05-10 ENCOUNTER — Encounter (HOSPITAL_COMMUNITY): Payer: Self-pay | Admitting: *Deleted

## 2015-05-10 ENCOUNTER — Observation Stay (INDEPENDENT_AMBULATORY_CARE_PROVIDER_SITE_OTHER): Payer: Medicare Other

## 2015-05-10 DIAGNOSIS — R079 Chest pain, unspecified: Secondary | ICD-10-CM | POA: Diagnosis not present

## 2015-05-10 DIAGNOSIS — Z7901 Long term (current) use of anticoagulants: Secondary | ICD-10-CM | POA: Diagnosis not present

## 2015-05-10 DIAGNOSIS — I10 Essential (primary) hypertension: Secondary | ICD-10-CM

## 2015-05-10 DIAGNOSIS — I482 Chronic atrial fibrillation: Secondary | ICD-10-CM

## 2015-05-10 DIAGNOSIS — R072 Precordial pain: Secondary | ICD-10-CM | POA: Diagnosis not present

## 2015-05-10 LAB — COMPREHENSIVE METABOLIC PANEL
ALK PHOS: 50 U/L (ref 38–126)
ALT: 17 U/L (ref 17–63)
ANION GAP: 9 (ref 5–15)
AST: 24 U/L (ref 15–41)
Albumin: 3 g/dL — ABNORMAL LOW (ref 3.5–5.0)
BUN: 13 mg/dL (ref 6–20)
CO2: 29 mmol/L (ref 22–32)
Calcium: 8.4 mg/dL — ABNORMAL LOW (ref 8.9–10.3)
Chloride: 102 mmol/L (ref 101–111)
Creatinine, Ser: 1.18 mg/dL (ref 0.61–1.24)
GFR calc Af Amer: >60 mL/min (ref >60)
Glucose, Bld: 182 mg/dL — ABNORMAL HIGH (ref 65–99)
POTASSIUM: 4.3 mmol/L (ref 3.5–5.1)
SODIUM: 140 mmol/L (ref 135–145)
Total Bilirubin: 0.4 mg/dL (ref 0.3–1.2)
Total Protein: 6.2 g/dL — ABNORMAL LOW (ref 6.5–8.1)

## 2015-05-10 LAB — CBC WITH DIFFERENTIAL/PLATELET
BASOS PCT: 0 % (ref 0–1)
Basophils Absolute: 0 10*3/uL (ref 0.0–0.1)
EOS ABS: 0.2 10*3/uL (ref 0.0–0.7)
Eosinophils Relative: 2 % (ref 0–5)
HEMATOCRIT: 40.6 % (ref 39.0–52.0)
HEMOGLOBIN: 13.3 g/dL (ref 13.0–17.0)
LYMPHS ABS: 3.3 10*3/uL (ref 0.7–4.0)
Lymphocytes Relative: 33 % (ref 12–46)
MCH: 27.7 pg (ref 26.0–34.0)
MCHC: 32.8 g/dL (ref 30.0–36.0)
MCV: 84.6 fL (ref 78.0–100.0)
MONO ABS: 0.8 10*3/uL (ref 0.1–1.0)
Monocytes Relative: 8 % (ref 3–12)
NEUTROS PCT: 57 % (ref 43–77)
Neutro Abs: 5.5 10*3/uL (ref 1.7–7.7)
PLATELETS: 177 10*3/uL (ref 150–400)
RBC: 4.8 MIL/uL (ref 4.22–5.81)
RDW: 16.2 % — ABNORMAL HIGH (ref 11.5–15.5)
WBC: 9.8 10*3/uL (ref 4.0–10.5)

## 2015-05-10 LAB — MRSA PCR SCREENING: MRSA by PCR: NEGATIVE

## 2015-05-10 LAB — GLUCOSE, CAPILLARY
GLUCOSE-CAPILLARY: 144 mg/dL — AB (ref 65–99)
GLUCOSE-CAPILLARY: 184 mg/dL — AB (ref 65–99)
Glucose-Capillary: 108 mg/dL — ABNORMAL HIGH (ref 65–99)

## 2015-05-10 LAB — STREP PNEUMONIAE URINARY ANTIGEN: Strep Pneumo Urinary Antigen: NEGATIVE

## 2015-05-10 LAB — C-REACTIVE PROTEIN: CRP: 0.9 mg/dL (ref <1.0)

## 2015-05-10 LAB — TROPONIN I
Troponin I: <0.03 ng/mL (ref <0.031)
Troponin I: <0.03 ng/mL (ref <0.031)

## 2015-05-10 LAB — CBG MONITORING, ED
Glucose-Capillary: 181 mg/dL — ABNORMAL HIGH (ref 65–99)
Glucose-Capillary: 232 mg/dL — ABNORMAL HIGH (ref 65–99)

## 2015-05-10 LAB — SEDIMENTATION RATE: Sed Rate: 18 mm/hr — ABNORMAL HIGH (ref 0–16)

## 2015-05-10 MED ORDER — ASPIRIN EC 81 MG PO TBEC
81.0000 mg | DELAYED_RELEASE_TABLET | Freq: Every day | ORAL | Status: DC
  Administered 2015-05-10 – 2015-05-11 (×2): 81 mg via ORAL
  Filled 2015-05-10 (×2): qty 1

## 2015-05-10 MED ORDER — FUROSEMIDE 10 MG/ML IJ SOLN
40.0000 mg | Freq: Once | INTRAMUSCULAR | Status: AC
  Administered 2015-05-10: 40 mg via INTRAVENOUS
  Filled 2015-05-10: qty 4

## 2015-05-10 MED ORDER — PERFLUTREN LIPID MICROSPHERE
1.0000 mL | INTRAVENOUS | Status: AC | PRN
  Administered 2015-05-10: 3 mL via INTRAVENOUS
  Filled 2015-05-10: qty 10

## 2015-05-10 NOTE — Progress Notes (Signed)
PROGRESS NOTE  [name]:[id number] DOB: Apr 07, 1971 DOA: 05/09/2015 PCP: Emeterio Reeve, MD  HPI/Recap of past 51 hours: 44 year old male with past history of obstructive sleep apnea, morbid obesity and atrial fibrillation on chronic anticoagulation presented with several days of midsternal left chest pressure associated with shortness of breath and worsening with movement, nonradiating. Patient came into the emergency room and initial troponins unremarkable. Echocardiogram done unrevealing. Patient states chest pain feels a little bit better. At home he noted that when his chest pain was flaring up, he associated this with rapid atrial fibrillation. Cardiology consulted.  Assessment/Plan: Principal Problem:   Chest pain: Unclear etiology. Heart score at 5. Troponin is raining normal we'll cycle. Echocardiogram unrevealing. Cardiology consulted and will decide on stress test Active Problems:   Morbid obesity: Patient is criteria with BMI greater than 40    OSA (obstructive sleep apnea): Continue CPAP   A-fib with chads 2 score of 4: Continue eliquis   Asthma: Stable   Essential hypertension: Continue home meds   Code Status: Full code  Family Communication: Left message with family  Disposition Plan: Possible discharge tomorrow following stress test if planned by cardiology   Consultants:  Cardiology  Procedures:  Echocardiogram done 8/8: Unrevealing  Antibiotics:  None   Objective: BP 120/66 mmHg  Pulse 63  Temp(Src) 98.2 F (36.8 C) (Oral)  Resp 15  Ht  (1.93 m)  Wt 280.595 kg (618 lb 9.6 oz)  BMI 75.33 kg/m2  SpO2 93% No intake or output data in the 24 hours ending 05/10/15 1723 Filed Weights   05/09/15 1652 05/10/15 1321  Weight: 266.715 kg (588 lb) 280.595 kg (618 lb 9.6 oz)    Exam:   General:  Alert and oriented 3, no acute distress  Cardiovascular: Irregular rhythm, rate controlled  Respiratory: Decreased breath sounds at secondary  to body habitus  Abdomen: Soft, obese, nontender, positive bowel sounds  Musculoskeletal: No clubbing or cyanosis or edema   Data Reviewed: Basic Metabolic Panel:  Recent Labs Lab 05/09/15 1710 05/10/15 0604  NA 138 140  K 4.3 4.3  CL 101 102  CO2 22 29  GLUCOSE 287* 182*  BUN 13 13  CREATININE 1.54* 1.18  CALCIUM 9.1 8.4*   Liver Function Tests:  Recent Labs Lab 05/10/15 0604  AST 24  ALT 17  ALKPHOS 50  BILITOT 0.4  PROT 6.2*  ALBUMIN 3.0*   No results for input(s): LIPASE, AMYLASE in the last 168 hours. No results for input(s): AMMONIA in the last 168 hours. CBC:  Recent Labs Lab 05/09/15 1710 05/10/15 0604  WBC 12.2* 9.8  NEUTROABS 9.2* 5.5  HGB 14.3 13.3  HCT 42.2 40.6  MCV 83.6 84.6  PLT 194 177   Cardiac Enzymes:    Recent Labs Lab 05/09/15 2256 05/10/15 0303 05/10/15 0933  TROPONINI <0.03 <0.03 <0.03   BNP (last 3 results)  Recent Labs  11/26/14 1628 05/09/15 1710  BNP 81.8 57.7    ProBNP (last 3 results) No results for input(s): PROBNP in the last 8760 hours.  CBG:  Recent Labs Lab 05/09/15 2256 05/10/15 0044 05/10/15 0943 05/10/15 1324  GLUCAP 292* 232* 181* 144*    Recent Results (from the past 240 hour(s))  MRSA PCR Screening     Status: None   Collection Time: 05/10/15  1:41 PM  Result Value Ref Range Status   MRSA by PCR NEGATIVE NEGATIVE Final    Comment:        The  GeneXpert MRSA Assay (FDA approved for NASAL specimens only), is one component of a comprehensive MRSA colonization surveillance program. It is not intended to diagnose MRSA infection nor to guide or monitor treatment for MRSA infections.      Studies: No results found.  Scheduled Meds: . amiodarone  200 mg Oral Daily  . aspirin EC  81 mg Oral Daily  . buPROPion  300 mg Oral Daily  . diltiazem  240 mg Oral Daily  . divalproex  1,000 mg Oral Daily  . furosemide  40 mg Oral Daily  . insulin aspart  0-15 Units Subcutaneous TID WC  .  insulin aspart  0-5 Units Subcutaneous QHS  . insulin aspart protamine- aspart  30 Units Subcutaneous BID WC  . lamoTRIgine  25 mg Oral QHS  . metoprolol succinate  50 mg Oral Daily  . rivaroxaban  20 mg Oral Q breakfast    Continuous Infusions:    Time spent:  25 minutes  Hollice Espy  [facility] Pager [phone]. If 7PM-7AM, please contact night-coverage at [email or web address], password [facility] 05/10/2015, 5:23 PM  LOS: 1 day

## 2015-05-10 NOTE — ED Notes (Signed)
CBG Taken = 181 

## 2015-05-10 NOTE — ED Notes (Signed)
Patient asked for and received a pitcher of ice water, toothbrush and tooth paste.

## 2015-05-10 NOTE — ED Notes (Signed)
Attempted report 

## 2015-05-10 NOTE — Progress Notes (Signed)
Echocardiogram 2D Echocardiogram has been performed.  [name] 05/10/2015, 12:36 PM

## 2015-05-11 DIAGNOSIS — I10 Essential (primary) hypertension: Secondary | ICD-10-CM | POA: Diagnosis not present

## 2015-05-11 DIAGNOSIS — R072 Precordial pain: Secondary | ICD-10-CM | POA: Diagnosis not present

## 2015-05-11 DIAGNOSIS — R079 Chest pain, unspecified: Secondary | ICD-10-CM | POA: Diagnosis not present

## 2015-05-11 DIAGNOSIS — I482 Chronic atrial fibrillation: Secondary | ICD-10-CM | POA: Diagnosis not present

## 2015-05-11 DIAGNOSIS — Z7901 Long term (current) use of anticoagulants: Secondary | ICD-10-CM | POA: Diagnosis not present

## 2015-05-11 LAB — LEGIONELLA ANTIGEN, URINE

## 2015-05-11 LAB — BLOOD GAS, ARTERIAL
ACID-BASE EXCESS: 5.2 mmol/L — AB (ref 0.0–2.0)
Bicarbonate: 30.9 mEq/L — ABNORMAL HIGH (ref 20.0–24.0)
DRAWN BY: 129711
O2 CONTENT: 4 L/min
O2 Saturation: 91.8 %
PATIENT TEMPERATURE: 98.6
TCO2: 32.7 mmol/L (ref 0–100)
pCO2 arterial: 60.8 mmHg (ref 35.0–45.0)
pH, Arterial: 7.326 — ABNORMAL LOW (ref 7.350–7.450)
pO2, Arterial: 65.3 mmHg — ABNORMAL LOW (ref 80.0–100.0)

## 2015-05-11 LAB — GLUCOSE, CAPILLARY
GLUCOSE-CAPILLARY: 166 mg/dL — AB (ref 65–99)
GLUCOSE-CAPILLARY: 203 mg/dL — AB (ref 65–99)
Glucose-Capillary: 197 mg/dL — ABNORMAL HIGH (ref 65–99)

## 2015-05-11 LAB — HEMOGLOBIN A1C
HEMOGLOBIN A1C: 8.9 % — AB (ref 4.8–5.6)
MEAN PLASMA GLUCOSE: 209 mg/dL

## 2015-05-11 LAB — TROPONIN I

## 2015-05-11 MED ORDER — METOPROLOL SUCCINATE ER 25 MG PO TB24: 25.0000 mg | ORAL_TABLET | Freq: Every day | ORAL | Status: DC

## 2015-05-11 MED ORDER — ALBUTEROL SULFATE (2.5 MG/3ML) 0.083% IN NEBU
5.0000 mg | INHALATION_SOLUTION | RESPIRATORY_TRACT | Status: DC | PRN
  Administered 2015-05-11: 5 mg via RESPIRATORY_TRACT
  Filled 2015-05-11: qty 6

## 2015-05-11 MED ORDER — FUROSEMIDE 10 MG/ML IJ SOLN
40.0000 mg | Freq: Once | INTRAMUSCULAR | Status: AC
  Administered 2015-05-11: 40 mg via INTRAVENOUS
  Filled 2015-05-11: qty 4

## 2015-05-11 NOTE — Progress Notes (Signed)
Patient agreed to wear Bipap tonight.  Set patient up with full face mask and 16.0 cmh20.  Patient states this is his home settings.  Patient tolerating well at this time will continue to monitor.

## 2015-05-11 NOTE — Progress Notes (Signed)
Patient Name: [name] Date of Encounter: 05/11/2015  Principal Problem:   Chest pain Active Problems:   Morbid obesity   Long term current use of anticoagulant   OSA (obstructive sleep apnea)   A-fib   Asthma   Essential hypertension   Primary Cardiologist: Dr Jens Som Patient Profile: 44 yo 600 lb man w/ D-CHF, HTN, A fib & flutter, DM, OHS, chest pain rx medically, was admitted 08/07 with chest pain.   SUBJECTIVE: Chest pain had improved when he went home and was able to sleep on his stomach. It is hurting now, worse with deep inspiration.  OBJECTIVE Filed Vitals:   05/10/15 2050 05/11/15 0017 05/11/15 0400 05/11/15 0805  BP: 129/58 106/49 146/90 121/58  Pulse: 70 79  67  Temp: 97.7 F (36.5 C) 98.3 F (36.8 C) 98.3 F (36.8 C) 97.8 F (36.6 C)  TempSrc: Oral Oral Oral Oral  Resp: Height:      Weight:      SpO2: 93% 95%      Intake/Output Summary (Last 24 hours) at 05/11/15 1059 Last data filed at 05/11/15 1001  Gross per 24 hour  Intake    600 ml  Output    600 ml  Net      0 ml   Filed Weights   05/09/15 1652 05/10/15 1321  Weight: 588 lb (266.715 kg) 618 lb 9.6 oz (280.595 kg)    PHYSICAL EXAM General: Well developed, well nourished, male in no acute distress. Head: Normocephalic, atraumatic.  Neck: Supple without bruits, JVD not able to assess 2nd body habitus. Lungs:  Resp regular and unlabored, decreased BS bases. Heart: irreg irreg, S1, S2, no S3, S4, or murmur; no rub. Abdomen: Soft, non-tender, non-distended, BS + x 4.  Extremities: No clubbing, cyanosis, trace edema.  Neuro: Alert and oriented X 3. Moves all extremities spontaneously. Psych: Normal affect.  LABS: CBC: Recent Labs  05/09/15 1710 05/10/15 0604  WBC 12.2* 9.8  NEUTROABS 9.2* 5.5  HGB 14.3 13.3  HCT 42.2 40.6  MCV 83.6 84.6  PLT 194 177   INR: Recent Labs  05/09/15 2256  INR 1.68*   Basic Metabolic Panel: Recent Labs  05/09/15 1710  05/10/15 0604  NA 138 140  K 4.3 4.3  CL 101 102  CO2 22 29  GLUCOSE 287* 182*  BUN 13 13  CREATININE 1.54* 1.18  CALCIUM 9.1 8.4*   Liver Function Tests: Recent Labs  05/10/15 0604  AST 24  ALT 17  ALKPHOS 50  BILITOT 0.4  PROT 6.2*  ALBUMIN 3.0*   Cardiac Enzymes: Recent Labs  05/09/15 2256 05/10/15 0303 05/10/15 0933  TROPONINI <0.03 <0.03 <0.03    Recent Labs  05/09/15 1718  TROPIPOC 0.00   BNP:  B NATRIURETIC PEPTIDE  Date/Time Value Ref Range Status  05/09/2015 05:10 PM 57.7 0.0 - 100.0 pg/mL Final  11/26/2014 04:28 PM 81.8 0.0 - 100.0 pg/mL Final   D-dimer: Recent Labs  05/09/15 1710  DDIMER 0.40   Hemoglobin A1C: Recent Labs  05/09/15 2256  HGBA1C 8.9*   TELE:  Atrial fib, brady at times to 30s, ?worse while sleeping?  Radiology/Studies: Dg Chest Port 1 View  05/09/2015   CLINICAL DATA:  Eval PNA, edema. Hx HTN, diabetes, CHF, COPD, asthma, and bronchitis.  EXAM: PORTABLE CHEST - 1 VIEW  COMPARISON:  11/28/2014  FINDINGS: Cardiac silhouette is mildly enlarged. No mediastinal or hilar masses or convincing adenopathy.  Hazy  opacity projects throughout the right hemi thorax may be from the overlying soft tissues. There is right-sided vascular congestion. There are no areas of focal lung consolidation. No convincing pleural effusion and no pneumothorax.  IMPRESSION: 1. Right-sided vascular congestion mild hazy right hemi thorax opacity. There is mild cardiomegaly. Findings suggest asymmetric pulmonary edema. No convincing pneumonia.   Electronically Signed   By: Amie Portland M.D.   On: 05/09/2015 17:27     Current Medications:  . amiodarone  200 mg Oral Daily  . aspirin EC  81 mg Oral Daily  . buPROPion  300 mg Oral Daily  . diltiazem  240 mg Oral Daily  . divalproex  1,000 mg Oral Daily  . furosemide  40 mg Oral Daily  . insulin aspart  0-15 Units Subcutaneous TID WC  . insulin aspart  0-5 Units Subcutaneous QHS  . insulin aspart protamine-  aspart  30 Units Subcutaneous BID WC  . lamoTRIgine  25 mg Oral QHS  . metoprolol succinate  50 mg Oral Daily  . rivaroxaban  20 mg Oral Q breakfast      ASSESSMENT AND PLAN: Principal Problem:   Chest pain - ez negative MI - atypical features include worse with deep inspiration, prolonged pain without ez or ECG changes - he describes it as a tightness. - continue current therapy with CRF control encouraged - reluctant to start narcotics, especially with respiratory issues.    Abnormal CXR - results above, MD to review - pt feels some SOB and CXR shows haziness. - O2 needs higher than normal, pt states was only using 2lpm qhs until in rehab, Now on daytime as well - currently 3lpm, Sats were 87% upon entering the room, but pt was sleeping (said no to CPAP right now) - got Lasix 40 mg IV 08/08, will give a dose of IV Lasix 40 mg now    A-fib - slow VR at times, probably while sleeping - has been on amio think since 2014 - also on Dilt 240 and Toprol XL 50 mg - continue home meds at current doses  Otherwise, per IM Active Problems:   Morbid obesity   Long term current use of anticoagulant   OSA (obstructive sleep apnea)   Asthma   Essential hypertension   Signed, Theodore Demark , PA-C 10:59 AM 05/11/2015  Agree with note by Theodore Demark PA-C  Non cardiac CP. Pleuritic. Enz neg. CAF on Xarelto. D-Dimer nl. Exam benign. Pts size precludes Non invasive functional testing or cath. Cont med Rx. Doubt PE since already on NOAC.    Runell Gess, M.D., FACP, [facility], [facility] [address] [place], [place]  [address]  [phone] 05/11/2015 1:05 PM

## 2015-05-11 NOTE — Discharge Instructions (Signed)
Oxygen Use at Home Oxygen can be prescribed for home use. The prescription will show the flow rate. This is how much oxygen is to be used per minute. This will be listed in liters per minute (LPM or L/M). A liter is a metric measurement of volume. You will use oxygen therapy as directed. It can be used while exercising, sleeping, or at rest. You may need oxygen continuously. Your health care provider may order a blood oxygen test (arterial blood gas or pulse oximetry test) that will show what your oxygen level is. Your health care provider will use these measurements to learn about your needs and follow your progress. Home oxygen therapy is commonly used on patients with various lung (pulmonary) related conditions. Some of these conditions include:  Asthma.  Lung cancer.  Pneumonia.  Emphysema.  Chronic bronchitis.  Cystic fibrosis.  Other lung diseases.  Pulmonary fibrosis.  Occupational lung disease.  Heart failure.  Chronic obstructive pulmonary disease (COPD). 3 COMMON WAYS OF PROVIDING OXYGEN THERAPY  Gas: The gas form of oxygen is put into variously sized cylinders or tanks. The cylinders or oxygen tanks contain compressed oxygen. The cylinder is equipped with a regulator that controls the flow rate. Because the flow of oxygen out of the cylinder is constant, an oxygen conserving device may be attached to the system to avoid waste. This device releases the gas only when you inhale and cuts it off when you exhale. Oxygen can be provided in a small cylinder that can be carried with you. Large tanks are heavy and are only for stationary use. After use, empty tanks must be exchanged for full tanks.  Liquid: The liquid form of oxygen is put into a container similar to a thermos. When released, the liquid converts to a gas and you breathe it in just like the compressed gas. This storage method takes up less space than the compressed gas cylinder, and you can transfer the liquid to a  small, portable vessel at home. Liquid oxygen is more expensive than the compressed gas, and the vessel vents when not in use. An oxygen conserving device may be built into the vessel to conserve the oxygen. Liquid oxygen is very cold, around 297 below zero.  Oxygen concentrator: This medical device filters oxygen from room air and gives almost 100% oxygen to the patient. Oxygen concentrators are powered by electricity. Benefits of this system are:  It does not need to be resupplied.  It is not as costly as liquid oxygen.  Extra tubing permits the user to move around easier. There are several types of small, portable oxygen systems available which can help you remain active and mobile. You must have a cylinder of oxygen as a backup in the event of a power failure. Advise your electric power company that you are on oxygen therapy in order to get priority service when there is a power failure. OXYGEN DELIVERY DEVICES There are 3 common ways to deliver oxygen to your body.  Nasal cannula. This is a 2-pronged device inserted in the nostrils that is connected to tubing carrying the oxygen. The tubing can rest on the ears or be attached to the frame of eyeglasses.  Mask. People who need a high flow of oxygen generally use a mask.  Transtracheal catheter. Transtracheal oxygen therapy requires the insertion of a small, flexible tube (catheter) in the windpipe (trachea). This catheter is held in place by a necklace. Since transtracheal oxygen bypasses the mouth, nose, and throat, a humidifier is   absolutely required at flow rates of 1 LPM or greater. OXYGEN USE SAFETY TIPS  Never smoke while using oxygen. Oxygen does not burn or explode, but flammable materials will burn faster in the presence of oxygen.  Keep a fire extinguisher close by. Let your fire department know that you have oxygen in your home.  Warn visitors not to smoke near you when you are using oxygen. Put up "no smoking" signs in your  home where you most often use the oxygen.  When you go to a restaurant with your portable oxygen source, ask to be seated in the nonsmoking section.  Stay at least 5 feet away from gas stoves, candles, lighted fireplaces, or other heat sources.  Do not use materials that burn easily (flammable) while using your oxygen.  If you use an oxygen cylinder, make sure it is secured to some fixed object or in a stand. If you use liquid oxygen, make sure the vessel is kept upright to keep the oxygen from pouring out. Liquid oxygen is so cold it can hurt your skin.  If you use an oxygen concentrator, call your electric company so you will be given priority service if your power goes out. Avoid using extension cords, if possible.  Regularly test your smoke detectors at home to make sure they work. If you receive care in your home from a nurse or other health care provider, he or she may also check to make sure your smoke detectors work. GUIDELINES FOR CLEANING YOUR EQUIPMENT  Wash the nasal prongs with a liquid soap. Thoroughly rinse them once or twice a week.  Replace the prongs every 2 to 4 weeks. If you have an infection (cold, pneumonia) change them when you are well.  Your health care provider will give you instructions on how to clean your transtracheal catheter.  The humidifier bottle should be washed with soap and warm water and rinsed thoroughly between each refill. Air-dry the bottle before filling it with sterile or distilled water. The bottle and its top should be disinfected after they are cleaned.  If you use an oxygen concentrator, unplug the unit. Then wipe down the cabinet with a damp cloth and dry it daily. The air filter should be cleaned at least twice a week.  Follow your home medical equipment and service company's directions for cleaning the compressor filter. HOME CARE INSTRUCTIONS   Do not change the flow of oxygen unless directed by your health care provider.  Do not use  alcohol or other sedating drugs unless instructed. They slow your breathing rate.  Do not use materials that burn easily (flammable) while using your oxygen.  Always keep a spare tank of oxygen. Plan ahead for holidays when you may not be able to get a prescription filled.  Use water-based lubricants on your lips or nostrils. Do not use an oil-based product like petroleum jelly.  To prevent your cheeks or the skin behind your ears from becoming irritated, tuck some gauze under the tubing.  If you have persistent redness under your nose, call your health care provider.  When you no longer need oxygen, your doctor will have the oxygen discontinued. Oxygen is not addicting or habit forming.  Use the oxygen as instructed. Too much oxygen can be harmful and too little will not give you the benefit you need.  Shortness of breath is not always from a lack of oxygen. If your oxygen level is not the cause of your shortness of breath, taking oxygen will   not help. SEEK MEDICAL CARE IF:   You have frequent headaches.  You have shortness of breath or a lasting cough.  You have anxiety.  You are confused.  You are drowsy or sleepy all the time.  You develop an illness which aggravates your breathing.  You cannot exercise.  You are restless.  You have blue lips or fingernails.  You have difficult or irregular breathing and it is getting worse.  You have a fever. Document Released: 12/09/2003 Document Revised: 02/02/2014 Document Reviewed: 04/30/2013 ExitCare Patient Information 2015 [employer]. This information is not intended to replace advice given to you by your health care provider. Make sure you discuss any questions you have with your health care provider.  

## 2015-05-11 NOTE — Consult Note (Signed)
CARDIOLOGY CONSULT NOTE      Patient ID: [name] MRN: [id number] DOB/AGE: 10/10/1970 44 y.o.  Admit date: 05/09/2015 Referring PhysicianSendil Mordecai Rasmussen, MD Primary PhysicianWOLTERS,SHARON A, MD Primary Cardiologist Dr. Jens Som Reason for Consultation chest discomfort  HPI: 44 year old man with a history of morbid obesity and diastolic heart failure. He has had multiple episodes of atypical chest pain. Evaluation has been limited due to his weight. He again had discomfort in his chest and back. The pain in his back is moving down into his abdomen. At this point, he states that his pain has improved. Before coming to the hospital, his whole body was hurting. He felt the pain in his chest if he pushed on his chest and a certain way. There is no associated nausea or vomiting. There is no excessive diaphoresis. Certainly movements of his upper chest also make the pain worse.  He also reports worsening leg swelling.  Review of systems complete and found to be negative unless listed above   Past Medical History  Diagnosis Date  . CHF (congestive heart failure)     EF55-60%  . HTN (hypertension)   . Ptosis   . Atrial flutter   . Atrial fibrillation   . Morbid obesity   . DM2 (diabetes mellitus, type 2)   . Depression   . Chronic low back pain   . Asthma   . Vision abnormalities   . Abscess     right thigh  . Sleep apnea   . Chronic respiratory failure   . Obesity hypoventilation syndrome     Family History  Problem Relation Age of Onset  . Coronary artery disease Mother     1s; father had it in 32s; both deceased in their 86s    . Cancer Mother     liver  . Heart disease Father   . Cancer Father     prostate    History   Social History  . Marital Status: Divorced    Spouse Name: N/A  . Number of Children: N/A  . Years of Education: N/A   Occupational History  . unemployeed    Social History Main Topics  . Smoking status: Never Smoker   . Smokeless tobacco:  Not on file     Comment: no significant tobacco use   . Alcohol Use: 0.6 oz/week    1 Standard drinks or equivalent per week     Comment: no significant use-- rare  . Drug Use: No  . Sexual Activity: Not on file   Other Topics Concern  . Not on file   Social History Narrative   Lives in [place]; works full time at [employer].    Occasionally takes echinacea and B12 supplements.    Regular diet; has not exercised regularly over the last 3 months.     Past Surgical History  Procedure Laterality Date  . Tonsillectomy    . Cholecystectomy    . Tonsillectomy    . Irrigation and debridement abscess  10/13/2011    Procedure: IRRIGATION AND DEBRIDEMENT ABSCESS;  Surgeon: Emelia Loron, MD;  Location: [facility] OR;  Service: General;  Laterality: Right;  Irrigation and debridement right thigh abscess  . Application of wound vac      right thigh abscess  . Breath tek h pylori N/A 10/06/2014    Procedure: BREATH TEK H PYLORI;  Surgeon: Valarie Merino, MD;  Location: [facility];  Service: General;  Laterality: N/A;  . Patellar tendon repair Left 12/21/2014  Procedure: PATELLA TENDON REPAIR;  Surgeon: Sheral Apley, MD;  Location: [facility] OR;  Service: Orthopedics;  Laterality: Left;     Prescriptions prior to admission  Medication Sig Dispense Refill Last Dose  . albuterol (VENTOLIN HFA) 108 (90 BASE) MCG/ACT inhaler Inhale 2 puffs into the lungs every 6 (six) hours as needed. Shortness of breath (Patient taking differently: Inhale 2 puffs into the lungs every 6 (six) hours as needed for wheezing or shortness of breath. ) 1 Inhaler 0 05/09/2015 at 1630  . ALPRAZolam (XANAX) 0.5 MG tablet Take 1 tablet (0.5 mg total) by mouth daily as needed for anxiety. 15 tablet 0 week ago  . amiodarone (PACERONE) 200 MG tablet Take 200 mg by mouth daily.   05/09/2015 at Unknown time  . buPROPion (WELLBUTRIN XL) 300 MG 24 hr tablet Take 300 mg by mouth Daily.    05/09/2015 at Unknown time  . cyclobenzaprine (FLEXERIL)  10 MG tablet Take 10 mg by mouth 3 (three) times daily as needed for muscle spasms.   05/09/2015 at am  . diltiazem (DILACOR XR) 240 MG 24 hr capsule Take 240 mg by mouth daily.   05/09/2015 at Unknown time  . divalproex (DEPAKOTE) 500 MG DR tablet Take 1,000 mg by mouth daily.    prior to previous admission  . furosemide (LASIX) 40 MG tablet Take 40 mg by mouth daily.   05/09/2015 at Unknown time  . hydrALAZINE (APRESOLINE) 10 MG tablet Take 1 tablet (10 mg total) by mouth every 8 (eight) hours. 90 tablet 0 05/09/2015 at 930  . insulin lispro protamine-lispro (HUMALOG 75/25 MIX) (75-25) 100 UNIT/ML SUSP injection Inject 40-75 Units into the skin 3 (three) times daily before meals. Inject 75 units with breakfast, 40 units with lunch, 75 units with supper   prior to previous admission  . lamoTRIgine (LAMICTAL) 25 MG tablet Take 25 mg by mouth at bedtime.    prior to previous admission  . metoprolol (TOPROL-XL) 50 MG 24 hr tablet Take 50 mg by mouth daily.   05/09/2015 at 930  . Naftifine HCl (NAFTIN) 2 % CREA Apply 1 application topically daily. Apply to feet.   prior to previous admission  . nitroGLYCERIN (NITROSTAT) 0.4 MG SL tablet Place 0.4 mg under the tongue every 5 (five) minutes as needed for chest pain.    2 years ago  . oxyCODONE-acetaminophen (PERCOCET) 5-325 MG per tablet Take 1-2 tablets by mouth every 4 (four) hours as needed for severe pain. (Patient taking differently: Take 1 tablet by mouth daily as needed for severe pain. ) 30 tablet 0 05/09/2015 at 930  . polyethylene glycol (MIRALAX / GLYCOLAX) packet Take 17 g by mouth daily as needed (constipation).   April 2016  . ramelteon (ROZEREM) 8 MG tablet Take 8 mg by mouth at bedtime as needed for sleep.   prior to previous admission  . rivaroxaban (XARELTO) 20 MG TABS tablet Take 1 tablet (20 mg total) by mouth daily with supper. (Patient taking differently: Take 20 mg by mouth daily with breakfast. ) 30 tablet 6 05/09/2015 at Unknown time  . insulin  aspart (NOVOLOG) 100 UNIT/ML injection Sliding scale   CBG 70 - 120: 0 units:  CBG 121 - 150: 2 units;  CBG 151 - 200: 3 units;  CBG 201 - 250: 5 units;  CBG 251 - 300: 8 units; CBG 301 - 350: 11 units; CBG 351 - 400: 15 units;   Use while on steroids/prednisone. (Patient not taking: Reported  on 05/09/2015) 10 mL 2 Not Taking at Unknown time  . insulin aspart protamine- aspart (NOVOLOG MIX 70/30) (70-30) 100 UNIT/ML injection Inject 0.55 mLs (55 Units total) into the skin 2 (two) times daily with a meal. (Patient not taking: Reported on 05/09/2015) 10 mL 11 Not Taking at Unknown time  . methocarbamol (ROBAXIN) 500 MG tablet Take 1 tablet (500 mg total) by mouth 4 (four) times daily. (Patient not taking: Reported on 05/09/2015) 20 tablet 0 Not Taking at Unknown time  . senna (SENOKOT) 8.6 MG TABS tablet Take 1 tablet (8.6 mg total) by mouth 2 (two) times daily. (Patient not taking: Reported on 05/09/2015) 120 each 0 Not Taking at Unknown time    Physical Exam: Vitals:   Filed Vitals:   05/10/15 2050 05/11/15 0017 05/11/15 0400 05/11/15 0805  BP: 129/58 106/49 146/90 121/58  Pulse: 70 79  67  Temp: 97.7 F (36.5 C) 98.3 F (36.8 C) 98.3 F (36.8 C) 97.8 F (36.6 C)  TempSrc: Oral Oral Oral Oral  Resp: Height:      Weight:      SpO2: 93% 95%     I&O's:   Intake/Output Summary (Last 24 hours) at 05/11/15 0859 Last data filed at 05/11/15 0848  Gross per 24 hour  Intake    600 ml  Output      0 ml  Net    600 ml   Physical exam:  [place]/AT EOMI No JVD, No carotid bruit RRR S1S2  No wheezing Soft. NT, nondistended, morbidly obese No edema. Skin changes on both lower extremities No focal motor or sensory deficits Normal affect  Labs:   Lab Results  Component Value Date   WBC 9.8 05/10/2015   HGB 13.3 05/10/2015   HCT 40.6 05/10/2015   MCV 84.6 05/10/2015   PLT 177 05/10/2015    Recent Labs Lab 05/10/15 0604  NA 140  K 4.3  CL 102  CO2 29  BUN 13    CREATININE 1.18  CALCIUM 8.4*  PROT 6.2*  BILITOT 0.4  ALKPHOS 50  ALT 17  AST 24  GLUCOSE 182*   Lab Results  Component Value Date   CKTOTAL 110 04/20/2012   CKMB 1.7 04/20/2012   TROPONINI <0.03 05/10/2015    Lab Results  Component Value Date   CHOL 149 03/26/2014   CHOL 164 04/20/2012   CHOL  04/20/2010    156        ATP III CLASSIFICATION:  <200     mg/dL   Desirable  161-096  mg/dL   Borderline High  >=045    mg/dL   High          Lab Results  Component Value Date   HDL 34* 03/26/2014   HDL 55 04/20/2012   HDL 40 04/20/2010   Lab Results  Component Value Date   LDLCALC 82 03/26/2014   LDLCALC 88 04/20/2012   LDLCALC  04/20/2010    81        Total Cholesterol/HDL:CHD Risk Coronary Heart Disease Risk Table                     Men   Women  1/2 Average Risk   3.4   3.3  Average Risk       5.0   4.4  2 X Average Risk   9.6   7.1  3 X Average Risk  23.4   11.0  Use the calculated Patient Ratio above and the CHD Risk Table to determine the patient's CHD Risk.        ATP III CLASSIFICATION (LDL):  <100     mg/dL   Optimal  811-914  mg/dL   Near or Above                    Optimal  130-159  mg/dL   Borderline  782-956  mg/dL   High  >213     mg/dL   Very High   Lab Results  Component Value Date   TRIG 167* 03/26/2014   TRIG 105 04/20/2012   TRIG 173* 04/20/2010   Lab Results  Component Value Date   CHOLHDL 4.4 03/26/2014   CHOLHDL 3.0 04/20/2012   CHOLHDL 3.9 04/20/2010   No results found for: LDLDIRECT    Radiology: Possible pulmonary edema EKG: Sinus rhythm, no ST segment changes  ASSESSMENT AND PLAN:  Principal Problem:   Chest pain Active Problems:   Morbid obesity   Long term current use of anticoagulant   OSA (obstructive sleep apnea)   A-fib   Asthma   Essential hypertension  Several atypical features of the chest discomfort. Stress testing would be suboptimal due to his size. If he rules out for MI, would continue  medical therapy. If he rules in, would have to consider cardiac cath. I'm not sure that we would be able to do a cardiac cath on him at [facility] due to the weight limit of the table.  Atrial fibrillation: Maintaining sinus rhythm at this time. Continue Xarelto for stroke prevention.  Diastolic heart failure: Continue Lasix. He may need a dose of IV Lasix as well given his leg swelling. Signed:   Fredric Mare, MD, [facility] 05/11/2015, 8:59 AM

## 2015-05-11 NOTE — Care Management Note (Signed)
Case Management Note  Patient Details  Name: [name] MRN: [id number] Date of Birth: 09-17-1971  Subjective/Objective: Pt plan for d/c today. Previously active with [facility] for [facility] Services: RN, PT and Aide.                     Action/Plan: CM did call Antionette Char in reference to pt being d/c today. CM also call Liaison for [facility] for DME Bariatric Hospital bed. Delivery will be set up for home. Pt states he has a rollator at home since 2014. He wanted to see if he could get a RW and CM did state it would need to be 5 yrs in between purchase. No further needs from CM at this time.    Expected Discharge Date:                  Expected Discharge Plan:  Home w Home Health Services  In-House Referral:  NA  Discharge planning Services  CM Consult  Post Acute Care Choice:  Home Health, Resumption of Svcs/PTA Provider Choice offered to:  Patient  DME Arranged:  Hospital bed DME Agency:  [facility]  HH Arranged:  RN, PT, Nurse's Aide HH Agency:  [facility]  Status of Service:  Completed, signed off  Medicare Important Message Given:    Date Medicare IM Given:    Medicare IM give by:    Date Additional Medicare IM Given:    Additional Medicare Important Message give by:     If discussed at Long Length of Stay Meetings, dates discussed:    Additional Comments:  Gala Lewandowsky, RN 05/11/2015, 3:52 PM

## 2015-05-11 NOTE — Discharge Summary (Signed)
Discharge Summary  [name] ZOX:[id number] DOB: March 03, 1971  PCP: Emeterio Reeve, MD  Admit date: 05/09/2015 Discharge date: 05/11/2015  Time spent: 25 minutes  Recommendations for Outpatient Follow-up:  1. Patient will follow up with his PCP in the next few weeks 2. Patient advised not to increase oxygen above 3 L unless advised by his physician  Discharge Diagnoses:  Active Hospital Problems   Diagnosis Date Noted  . Chest pain 04/19/2012  . Essential hypertension   . Asthma 03/27/2014  . A-fib 04/19/2012  . OSA (obstructive sleep apnea) 04/19/2012  . Long term current use of anticoagulant 11/14/2010  . Morbid obesity 06/17/2007    Resolved Hospital Problems   Diagnosis Date Noted Date Resolved  No resolved problems to display.    Discharge Condition: Improved, being discharged home  Diet recommendation: Heart healthy  Filed Weights   05/09/15 1652 05/10/15 1321  Weight: 266.715 kg (588 lb) 280.595 kg (618 lb 9.6 oz)    History of present illness:  44 year old male with past history of obstructive sleep apnea, morbid obesity and atrial fibrillation on chronic anticoagulation presented with several days of midsternal left chest pressure associated with shortness of breath and worsening with movement, nonradiating. Patient came into the emergency room and initial troponins unremarkable. Echocardiogram done unrevealing. Patient states chest pain feels a little bit better. At home he noted that when his chest pain was flaring up, he associated this with rapid atrial fibrillation. Cardiology consulted.  Hospital Course:  Principal Problem:   Chest pain: See my cardiology. Enzymes 3 negative. Heart score of 5. Cardiology advised that unless patient ruled in, further intervention would not be needed. Furthermore, if patient did rule in, there was concern about whether he would be able to have heart catheterization done given his severe morbid obesity. However, enzymes normal  and chest pain felt to not be cardiac. In fact, patient would then start to complain of shortness of breath although oxygen saturation saturations normal. Suspect that he may have some anxiety component. Active Problems:   Morbid obesity: Patient meets criteria given BMI greater than 40 : Chronic respiratory failure with hypercarbia: With patient's complaints of shortness of breath, arterial blood gas checked. Patient noted to have some mild CO2 retention. He normally was on 2 L and when feeling short of breath at home had increased himself to 4 L. Advised him to increase his oxygen no higher than 3 L for concerns of CO2 retention and hypercarbia   OSA (obstructive sleep apnea): Patient continued on CPAP   A-fib with chads 2 score of 4, continued on eliquis   Asthma   Essential hypertension: Stable   Procedures:  Echocardiogram done 8/8: Normal  Consultations:  Cardiology  Discharge Exam: BP 121/58 mmHg  Pulse 67  Temp(Src) 97.8 F (36.6 C) (Oral)  Resp 20  Ht 6\' 4"  (1.93 m)  Wt 280.595 kg (618 lb 9.6 oz)  BMI 75.33 kg/m2  SpO2 95%  General: Alert and oriented 3, mildly anxious Cardiovascular: Irregular rhythm, rate controlled Respiratory: Decreased breath sounds throughout secondary to body habitus  Discharge Instructions You were cared for by a hospitalist during your hospital stay. If you have any questions about your discharge medications or the care you received while you were in the hospital after you are discharged, you can call the unit and asked to speak with the hospitalist on call if the hospitalist that took care of you is not available. Once you are discharged, your primary care physician will  handle any further medical issues. Please note that NO REFILLS for any discharge medications will be authorized once you are discharged, as it is imperative that you return to your primary care physician (or establish a relationship with a primary care physician if you do not  have one) for your aftercare needs so that they can reassess your need for medications and monitor your lab values.  Discharge Instructions    Diet - low sodium heart healthy    Complete by:  As directed      Discharge instructions    Complete by:  As directed   Do not increase oxygen past 3 liters unless the doctor tells you to.     Increase activity slowly    Complete by:  As directed             Medication List    STOP taking these medications        insulin aspart protamine- aspart (70-30) 100 UNIT/ML injection  Commonly known as:  NOVOLOG MIX 70/30     methocarbamol 500 MG tablet  Commonly known as:  ROBAXIN      TAKE these medications        albuterol 108 (90 BASE) MCG/ACT inhaler  Commonly known as:  VENTOLIN HFA  Inhale 2 puffs into the lungs every 6 (six) hours as needed. Shortness of breath     ALPRAZolam 0.5 MG tablet  Commonly known as:  XANAX  Take 1 tablet (0.5 mg total) by mouth daily as needed for anxiety.     amiodarone 200 MG tablet  Commonly known as:  PACERONE  Take 200 mg by mouth daily.     buPROPion 300 MG 24 hr tablet  Commonly known as:  WELLBUTRIN XL  Take 300 mg by mouth Daily.     cyclobenzaprine 10 MG tablet  Commonly known as:  FLEXERIL  Take 10 mg by mouth 3 (three) times daily as needed for muscle spasms.     diltiazem 240 MG 24 hr capsule  Commonly known as:  DILACOR XR  Take 240 mg by mouth daily.     divalproex 500 MG DR tablet  Commonly known as:  DEPAKOTE  Take 1,000 mg by mouth daily.     furosemide 40 MG tablet  Commonly known as:  LASIX  Take 40 mg by mouth daily.     hydrALAZINE 10 MG tablet  Commonly known as:  APRESOLINE  Take 1 tablet (10 mg total) by mouth every 8 (eight) hours.     insulin aspart 100 UNIT/ML injection  Commonly known as:  novoLOG  Sliding scale   CBG 70 - 120: 0 units:  CBG 121 - 150: 2 units;  CBG 151 - 200: 3 units;  CBG 201 - 250: 5 units;  CBG 251 - 300: 8 units; CBG 301 - 350: 11  units; CBG 351 - 400: 15 units;   Use while on steroids/prednisone.     insulin lispro protamine-lispro (75-25) 100 UNIT/ML Susp injection  Commonly known as:  HUMALOG 75/25 MIX  Inject 40-75 Units into the skin 3 (three) times daily before meals. Inject 75 units with breakfast, 40 units with lunch, 75 units with supper     lamoTRIgine 25 MG tablet  Commonly known as:  LAMICTAL  Take 25 mg by mouth at bedtime.     metoprolol succinate 50 MG 24 hr tablet  Commonly known as:  TOPROL-XL  Take 50 mg by mouth daily.     NAFTIN  2 % Crea  Generic drug:  Naftifine HCl  Apply 1 application topically daily. Apply to feet.     nitroGLYCERIN 0.4 MG SL tablet  Commonly known as:  NITROSTAT  Place 0.4 mg under the tongue every 5 (five) minutes as needed for chest pain.     oxyCODONE-acetaminophen 5-325 MG per tablet  Commonly known as:  PERCOCET  Take 1-2 tablets by mouth every 4 (four) hours as needed for severe pain.     polyethylene glycol packet  Commonly known as:  MIRALAX / GLYCOLAX  Take 17 g by mouth daily as needed (constipation).     ramelteon 8 MG tablet  Commonly known as:  ROZEREM  Take 8 mg by mouth at bedtime as needed for sleep.     rivaroxaban 20 MG Tabs tablet  Commonly known as:  XARELTO  Take 1 tablet (20 mg total) by mouth daily with supper.     senna 8.6 MG Tabs tablet  Commonly known as:  SENOKOT  Take 1 tablet (8.6 mg total) by mouth 2 (two) times daily.       Allergies  Allergen Reactions  . Iodine Anaphylaxis and Other (See Comments)    Associated with the shellfish allergy  . Shellfish Allergy Anaphylaxis       Follow-up Information    Follow up with Emeterio Reeve, MD. Go on 05/18/2015.   Specialty:  Family Medicine   Why:  for hospital follow up @ 1:00pm    Contact information:   [address] [place] [address] [phone]       Follow up with [facility].   Specialty:  Home Health Services   Why:   Registered Nurse, Physical Therapy and Aide.    Contact information:   [address] [place] [address] [phone]       Follow up with [facility].   Why:  Bariatric Hospital Bed.    Contact information:   [address] [place] [address] [phone]        The results of significant diagnostics from this hospitalization (including imaging, microbiology, ancillary and laboratory) are listed below for reference.    Significant Diagnostic Studies: Dg Chest Port 1 View  05/09/2015   CLINICAL DATA:  Eval PNA, edema. Hx HTN, diabetes, CHF, COPD, asthma, and bronchitis.  EXAM: PORTABLE CHEST - 1 VIEW  COMPARISON:  11/28/2014  FINDINGS: Cardiac silhouette is mildly enlarged. No mediastinal or hilar masses or convincing adenopathy.  Hazy opacity projects throughout the right hemi thorax may be from the overlying soft tissues. There is right-sided vascular congestion. There are no areas of focal lung consolidation. No convincing pleural effusion and no pneumothorax.  IMPRESSION: 1. Right-sided vascular congestion mild hazy right hemi thorax opacity. There is mild cardiomegaly. Findings suggest asymmetric pulmonary edema. No convincing pneumonia.   Electronically Signed   By: Amie Portland M.D.   On: 05/09/2015 17:27    Microbiology: Recent Results (from the past 240 hour(s))  MRSA PCR Screening     Status: None   Collection Time: 05/10/15  1:41 PM  Result Value Ref Range Status   MRSA by PCR NEGATIVE NEGATIVE Final    Comment:        The GeneXpert MRSA Assay (FDA approved for NASAL specimens only), is one component of a comprehensive MRSA colonization surveillance program. It is not intended to diagnose MRSA infection nor to guide or monitor treatment for MRSA  infections.      Labs: Basic Metabolic Panel:  Recent Labs Lab 05/09/15 1710 05/10/15 0604  NA 138 140  K 4.3 4.3  CL 101 102  CO2 22 29  GLUCOSE 287* 182*  BUN 13 13    CREATININE 1.54* 1.18  CALCIUM 9.1 8.4*   Liver Function Tests:  Recent Labs Lab 05/10/15 0604  AST 24  ALT 17  ALKPHOS 50  BILITOT 0.4  PROT 6.2*  ALBUMIN 3.0*   No results for input(s): LIPASE, AMYLASE in the last 168 hours. No results for input(s): AMMONIA in the last 168 hours. CBC:  Recent Labs Lab 05/09/15 1710 05/10/15 0604  WBC 12.2* 9.8  NEUTROABS 9.2* 5.5  HGB 14.3 13.3  HCT 42.2 40.6  MCV 83.6 84.6  PLT 194 177   Cardiac Enzymes:  Recent Labs Lab 05/09/15 2256 05/10/15 0303 05/10/15 0933 05/11/15 1141  TROPONINI <0.03 <0.03 <0.03 <0.03   BNP: BNP (last 3 results)  Recent Labs  11/26/14 1628 05/09/15 1710  BNP 81.8 57.7    ProBNP (last 3 results) No results for input(s): PROBNP in the last 8760 hours.  CBG:  Recent Labs Lab 05/10/15 1654 05/10/15 2054 05/11/15 0726 05/11/15 1155 05/11/15 1636  GLUCAP 184* 108* 197* 166* 203*       Signed:  Kalese Ensz K  [facility] 05/11/2015, 7:34 PM

## 2015-05-19 NOTE — Patient Outreach (Signed)
[facility]) Care Management  05/19/2015  EULICE RUTLEDGE November 10, 1970 [id number]   Assignment per High Risk List to [name], RN.  Thanks, Corrie Mckusick. Sharlee Blew [facility] Care Management [facility] CM Assistant Phone: [phone] Fax: [phone]

## 2015-05-27 ENCOUNTER — Other Ambulatory Visit: Payer: Self-pay

## 2015-05-27 NOTE — Patient Outreach (Signed)
[facility]) Care Management  05/27/2015  [name] 1971/01/09 [id number]   Telephonic Care Management Note:  Screening   Referral Date:  05/19/15 Referral Source:  MD Referral Issue:  CHF, COPD, 2 admissions in past 12 months per data as of 03/02/15.   Outreach call #1 to patient.  Patient not reached.   RN CM left HIPAA compliant voice message with name and #.  RN CM will reschedule for next outreach call within one week.    [name], RN, BSN, [facility], CCM  [facility] Management Coordinator [phone] Direct [phone] Cell [phone] Office [phone] Fax

## 2015-05-31 ENCOUNTER — Other Ambulatory Visit: Payer: Self-pay

## 2015-05-31 DIAGNOSIS — E669 Obesity, unspecified: Principal | ICD-10-CM

## 2015-05-31 DIAGNOSIS — E1169 Type 2 diabetes mellitus with other specified complication: Secondary | ICD-10-CM

## 2015-05-31 NOTE — Patient Outreach (Addendum)
[facility]) Care Management  05/31/2015  [name] 03-May-1971 [id number]   Telephonic Care Management Note: Screening   Referral Date: 05/19/15 Referral Source: MD Referral Issue: CHF, COPD, 2 admissions in past 12 months per data as of 03/02/15.   Outreach call #2 to patient. Patient reached.  Social:   Lives in his home alone.  Student at [employer].  Patient has taken the semester off due to rehab needs. Patient states H/O falling on [employer] [facility] and rupturing his patella tendon 12/2014.   [facility]  Admission:  12/16/14 - 12/23/14  Dr. Eulah Pont completed knee surgery 12/21/14  Patient discharged [facility] 12/23/14- 05/03/15  Patient states he was told by the [facility] SW they use [place] but he did not choose this provider; states he would have chosen [facility]. States he has not been feeling well over the last few days; but he is visiting his aunt who he does not see very often.  Patient states he missed his follow-up appt on Friday with his PCP because it "slipped his mind".  States he is active with Web designer, PT, SW, HHA services and notified them he would not be back home until Wednesday or Thursday of this week (8/31 or 9/1).   Falls: None since discharge Caregiver:  None Family Support:  Sister's and brother's who live 5-10 minutes away.  Emergency Contact:  Betsy Pries, Sister,  Advanced Directive: None DME: Rolator, (bariatric walker/insurance will not pay for due to paying for Rolator).  BSC/Shower Chair, reacher, Corrin Parker,  Motorized W/C pending due to lack of information. Provider: __unknown by patient.  States out of  [place], [place]  [phone] Contact: Ian Malkin.   Medications:  More than 10  Consent: RN CM provided introduction to [facility] services.  Patient agreed to services.  RN CM advised that [facility] Community RN CM will contact patient within the next 10 business days to schedule home  visit.  Adherence Issues:   RN CM advised patient in the importance of compliance with treatment program.  Advised in importance to remain compliant and available for [facility] appointments in order to continue with rehab progression.  Advised very difficult to monitor patient and identify needs if patient is not available for [facility] visits or keeping MD appt's.   RN CM encouraged to resume plan of care as soon as possible.  RN CM instructed patient to locate name of W/C provider and provide update back to RN CM.     Plan RN CM sent referral to [facility] Community RN CM services.  RN CM identifies patient high risk for admission; H/O 3 admissions, 1 ED visit, and 1 SNF/REHAB 7 3/4  months.  MD Appointment non-adherence HH Services non-adherence DME coordination needs:  [employer] W/C. Follow-up with patient to see if patient rescheduled 05/28/15 missed appointment with Dr. Paulino Rily.  Follow-up with patient to see if patient has name of Electric W/C provider.   RN CM notified [facility] - case opened - Level 4.   [name], RN, BSN, [facility], CCM  [facility] Management Coordinator [phone] Direct [phone] Cell [phone] Office [phone] Fax

## 2015-05-31 NOTE — Patient Outreach (Signed)
[facility]) Care Management  05/31/2015  WILBUR OAKLAND 1970-12-30 [id number]   Request from [name], RN to assign Community RN, assigned Emilia Beck, RN.  Thanks, Corrie Mckusick. Sharlee Blew [facility] Care Management [facility] CM Assistant Phone: [phone] Fax: [phone]

## 2015-06-02 ENCOUNTER — Other Ambulatory Visit: Payer: Self-pay

## 2015-06-02 NOTE — Patient Outreach (Signed)
  Initial telephone made to patient for community care coordination.  patient gave name and address with birth date with each telephone connection. Patient was agreeable to telephonic community care coordination.    Did not have a good experience at [facility] Estanislado Spire, Social Worker, [facility]. [phone])  Social Worker from [place] need to contact  [employer], [phone]. Call made to social worker to coordinate time for home visit with patient.  SW states she has patient on her on schedule to see him today, patient refused to give location, only stated he was near [place], would not give location.  Call then made to patient to advise him of information collected.  Patient states he is not home, refused to give his location only that he is near [place], [place].  This RNCM then called SW to advise her of patients request to call him to schedule an appointment and his refusal to give address where he is currently   [facility] [place] [phone]-wheelchair.  Spoke with Olegario Messier who states agency is awaiting return of paperwork verifying need for wheelchair from primary care physician.  Olegario Messier advised by this RNCM patient has appointment with primary care physician on tomorrow, patient stated earlier in this conversation he plans to have paperwork filled during his visit.    Plan: Follow up with patient via telephone during week of September 12

## 2015-06-03 ENCOUNTER — Other Ambulatory Visit: Payer: Self-pay

## 2015-06-03 NOTE — Patient Outreach (Signed)
[facility]) Care Management  06/03/2015  [name] 03-12-1971 [id number]    Telephonic Care Management Note  Inbound voice mail message from patient providing contact information for W/C Provider:   [facility] Contact: Ian Malkin [phone] Patient states SW, Estanislado Spire at [facility] initiated the request.   [name], RN, BSN, [facility], CCM  [facility] Care Management Care Management Coordinator [phone] Direct [phone] Cell [phone] Office [phone] Fax

## 2015-06-14 ENCOUNTER — Other Ambulatory Visit: Payer: Self-pay

## 2015-06-14 NOTE — Patient Outreach (Signed)
This RNCM was successful in making contact with patient. Patient identified himself using HIPPA identifiers by providing date of birth and address.  This RNCM and patient discuss patient's progress which includes completion with nursing services through [place].  Patient, however, continues to work with physical therapy through [place].    Patient continues to wait on delivery of motorized wheelchair, after paperwork sent in verifying the need for wheelchair with motorized capabilities.  This RNCM and patient agreed to home visit on Tuesday, September 20 at 10am.  Plan: Home visit on September 20 at 10am

## 2015-06-22 ENCOUNTER — Other Ambulatory Visit: Payer: Self-pay

## 2015-06-22 NOTE — Patient Outreach (Signed)
[facility]) Care Management  06/22/2015  [name] 1971-08-24 [id number]   This patient arrived at patient's home for scheduled home visit for assessment of need for community care coordination. Patient stated he forgot about the appointment, requested appointment be rescheduled. Patient asked to be called later today to reschedule.   Plan: Telephone call to reschedule appointment.

## 2015-06-24 ENCOUNTER — Other Ambulatory Visit: Payer: Medicare Other

## 2015-06-24 NOTE — Patient Outreach (Signed)
Unsuccessful attempt made to contact patietn to reschedule home visit from earlier this week.  Will make another attempt again tomorrow, September 23

## 2015-06-25 ENCOUNTER — Ambulatory Visit: Payer: Medicare Other

## 2015-06-25 ENCOUNTER — Other Ambulatory Visit: Payer: Self-pay

## 2015-06-25 NOTE — Patient Outreach (Signed)
Unsuccessful attempt made to contact patient via telephone to schedule home visit, HIPPA compliant message left at  # [phone]. This RNCM went to patient's home earlier this week for scheduled home visit and patient requested that the visit be rescheduled because he forgot about it.   Will attempt to contact patient on Wednesday, September 28 to reschedule home visit

## 2015-06-29 ENCOUNTER — Other Ambulatory Visit: Payer: Self-pay

## 2015-06-29 NOTE — Patient Outreach (Signed)
This RNCM was unsuccessful in making telephone contact with patient by calling [phone]. Several unsuccessful attempts have been made.  Will make final attempt to contact patient on Friday, September 30.

## 2015-07-05 ENCOUNTER — Other Ambulatory Visit: Payer: Self-pay

## 2015-07-05 NOTE — Patient Outreach (Signed)
Successful contact made with patient to schedule home visit. Patient identified himself using HIPPA identifiers by providing date of birth and address.  Patient agreed to home visit on later October for further assessment of community care coordination.

## 2015-07-16 ENCOUNTER — Ambulatory Visit: Payer: Medicare Other | Admitting: Dietician

## 2015-07-20 ENCOUNTER — Emergency Department (HOSPITAL_COMMUNITY): Payer: Medicare Other

## 2015-07-20 ENCOUNTER — Other Ambulatory Visit: Payer: Self-pay

## 2015-07-20 ENCOUNTER — Encounter (HOSPITAL_COMMUNITY): Payer: Self-pay | Admitting: Emergency Medicine

## 2015-07-20 ENCOUNTER — Inpatient Hospital Stay (HOSPITAL_COMMUNITY)
Admission: EM | Admit: 2015-07-20 | Discharge: 2015-07-26 | DRG: 208 | Disposition: A | Discharge status: Other Acute Inpatient Hospital | Payer: Medicare Other | Attending: Internal Medicine | Admitting: Internal Medicine

## 2015-07-20 DIAGNOSIS — Z7901 Long term (current) use of anticoagulants: Secondary | ICD-10-CM

## 2015-07-20 DIAGNOSIS — E1165 Type 2 diabetes mellitus with hyperglycemia: Secondary | ICD-10-CM | POA: Diagnosis present

## 2015-07-20 DIAGNOSIS — E872 Acidosis: Secondary | ICD-10-CM | POA: Diagnosis present

## 2015-07-20 DIAGNOSIS — N179 Acute kidney failure, unspecified: Secondary | ICD-10-CM | POA: Diagnosis present

## 2015-07-20 DIAGNOSIS — E1101 Type 2 diabetes mellitus with hyperosmolarity with coma: Secondary | ICD-10-CM

## 2015-07-20 DIAGNOSIS — Z91013 Allergy to seafood: Secondary | ICD-10-CM

## 2015-07-20 DIAGNOSIS — G9340 Encephalopathy, unspecified: Secondary | ICD-10-CM | POA: Diagnosis present

## 2015-07-20 DIAGNOSIS — Z6841 Body Mass Index (BMI) 40.0 and over, adult: Secondary | ICD-10-CM

## 2015-07-20 DIAGNOSIS — Z794 Long term (current) use of insulin: Secondary | ICD-10-CM | POA: Diagnosis not present

## 2015-07-20 DIAGNOSIS — I959 Hypotension, unspecified: Secondary | ICD-10-CM | POA: Diagnosis present

## 2015-07-20 DIAGNOSIS — B370 Candidal stomatitis: Secondary | ICD-10-CM | POA: Diagnosis present

## 2015-07-20 DIAGNOSIS — I10 Essential (primary) hypertension: Secondary | ICD-10-CM | POA: Diagnosis present

## 2015-07-20 DIAGNOSIS — J9690 Respiratory failure, unspecified, unspecified whether with hypoxia or hypercapnia: Secondary | ICD-10-CM | POA: Diagnosis present

## 2015-07-20 DIAGNOSIS — L89329 Pressure ulcer of left buttock, unspecified stage: Secondary | ICD-10-CM | POA: Diagnosis present

## 2015-07-20 DIAGNOSIS — I4892 Unspecified atrial flutter: Secondary | ICD-10-CM | POA: Diagnosis present

## 2015-07-20 DIAGNOSIS — J9600 Acute respiratory failure, unspecified whether with hypoxia or hypercapnia: Secondary | ICD-10-CM | POA: Diagnosis present

## 2015-07-20 DIAGNOSIS — E876 Hypokalemia: Secondary | ICD-10-CM | POA: Diagnosis present

## 2015-07-20 DIAGNOSIS — J189 Pneumonia, unspecified organism: Secondary | ICD-10-CM | POA: Diagnosis present

## 2015-07-20 DIAGNOSIS — J9621 Acute and chronic respiratory failure with hypoxia: Secondary | ICD-10-CM | POA: Diagnosis present

## 2015-07-20 DIAGNOSIS — Z8249 Family history of ischemic heart disease and other diseases of the circulatory system: Secondary | ICD-10-CM

## 2015-07-20 DIAGNOSIS — E870 Hyperosmolality and hypernatremia: Secondary | ICD-10-CM | POA: Diagnosis present

## 2015-07-20 DIAGNOSIS — G4733 Obstructive sleep apnea (adult) (pediatric): Secondary | ICD-10-CM | POA: Diagnosis present

## 2015-07-20 DIAGNOSIS — J45909 Unspecified asthma, uncomplicated: Secondary | ICD-10-CM | POA: Diagnosis present

## 2015-07-20 DIAGNOSIS — Z79891 Long term (current) use of opiate analgesic: Secondary | ICD-10-CM | POA: Diagnosis not present

## 2015-07-20 DIAGNOSIS — E11621 Type 2 diabetes mellitus with foot ulcer: Secondary | ICD-10-CM | POA: Diagnosis present

## 2015-07-20 DIAGNOSIS — J810 Acute pulmonary edema: Secondary | ICD-10-CM | POA: Diagnosis not present

## 2015-07-20 DIAGNOSIS — Z800 Family history of malignant neoplasm of digestive organs: Secondary | ICD-10-CM | POA: Diagnosis not present

## 2015-07-20 DIAGNOSIS — I110 Hypertensive heart disease with heart failure: Secondary | ICD-10-CM | POA: Diagnosis present

## 2015-07-20 DIAGNOSIS — L89319 Pressure ulcer of right buttock, unspecified stage: Secondary | ICD-10-CM | POA: Diagnosis present

## 2015-07-20 DIAGNOSIS — J9622 Acute and chronic respiratory failure with hypercapnia: Principal | ICD-10-CM

## 2015-07-20 DIAGNOSIS — Z79899 Other long term (current) drug therapy: Secondary | ICD-10-CM

## 2015-07-20 DIAGNOSIS — I5033 Acute on chronic diastolic (congestive) heart failure: Secondary | ICD-10-CM | POA: Diagnosis present

## 2015-07-20 DIAGNOSIS — J9602 Acute respiratory failure with hypercapnia: Secondary | ICD-10-CM | POA: Diagnosis not present

## 2015-07-20 DIAGNOSIS — G8929 Other chronic pain: Secondary | ICD-10-CM | POA: Diagnosis present

## 2015-07-20 DIAGNOSIS — E1100 Type 2 diabetes mellitus with hyperosmolarity without nonketotic hyperglycemic-hyperosmolar coma (NKHHC): Secondary | ICD-10-CM | POA: Diagnosis present

## 2015-07-20 DIAGNOSIS — J811 Chronic pulmonary edema: Secondary | ICD-10-CM

## 2015-07-20 DIAGNOSIS — I4891 Unspecified atrial fibrillation: Secondary | ICD-10-CM | POA: Diagnosis present

## 2015-07-20 DIAGNOSIS — I480 Paroxysmal atrial fibrillation: Secondary | ICD-10-CM | POA: Diagnosis present

## 2015-07-20 DIAGNOSIS — L97519 Non-pressure chronic ulcer of other part of right foot with unspecified severity: Secondary | ICD-10-CM | POA: Diagnosis present

## 2015-07-20 DIAGNOSIS — Y95 Nosocomial condition: Secondary | ICD-10-CM | POA: Diagnosis present

## 2015-07-20 DIAGNOSIS — IMO0002 Reserved for concepts with insufficient information to code with codable children: Secondary | ICD-10-CM | POA: Diagnosis present

## 2015-07-20 DIAGNOSIS — L8990 Pressure ulcer of unspecified site, unspecified stage: Secondary | ICD-10-CM | POA: Insufficient documentation

## 2015-07-20 DIAGNOSIS — M545 Low back pain: Secondary | ICD-10-CM | POA: Diagnosis present

## 2015-07-20 DIAGNOSIS — Z8042 Family history of malignant neoplasm of prostate: Secondary | ICD-10-CM

## 2015-07-20 DIAGNOSIS — Z7409 Other reduced mobility: Secondary | ICD-10-CM | POA: Diagnosis not present

## 2015-07-20 DIAGNOSIS — Z91048 Other nonmedicinal substance allergy status: Secondary | ICD-10-CM

## 2015-07-20 DIAGNOSIS — R0602 Shortness of breath: Secondary | ICD-10-CM | POA: Diagnosis present

## 2015-07-20 DIAGNOSIS — E662 Morbid (severe) obesity with alveolar hypoventilation: Secondary | ICD-10-CM | POA: Diagnosis present

## 2015-07-20 HISTORY — DX: Strain of other muscle(s) and tendon(s) at lower leg level, unspecified leg, initial encounter: S86.819A

## 2015-07-20 HISTORY — DX: Cellulitis of unspecified part of limb: L03.119

## 2015-07-20 LAB — BLOOD GAS, ARTERIAL
ACID-BASE EXCESS: 1.5 mmol/L (ref 0.0–2.0)
Bicarbonate: 31.3 mEq/L — ABNORMAL HIGH (ref 20.0–24.0)
Drawn by: 39899
FIO2: 1
O2 Saturation: 89.7 %
PH ART: 7.225 — AB (ref 7.350–7.450)
Patient temperature: 98.6
TCO2: 29 mmol/L (ref 0–100)
pCO2 arterial: 78.5 mmHg (ref 35.0–45.0)
pO2, Arterial: 74.9 mmHg — ABNORMAL LOW (ref 80.0–100.0)

## 2015-07-20 LAB — CBC WITH DIFFERENTIAL/PLATELET
BASOS ABS: 0 10*3/uL (ref 0.0–0.1)
BASOS PCT: 0 %
Basophils Absolute: 0 10*3/uL (ref 0.0–0.1)
Basophils Relative: 0 %
EOS ABS: 0 10*3/uL (ref 0.0–0.7)
Eosinophils Absolute: 0.1 10*3/uL (ref 0.0–0.7)
Eosinophils Relative: 0 %
Eosinophils Relative: 1 %
HCT: 45.8 % (ref 39.0–52.0)
HEMATOCRIT: 40.5 % (ref 39.0–52.0)
HEMOGLOBIN: 14.7 g/dL (ref 13.0–17.0)
HEMOGLOBIN: 13.1 g/dL (ref 13.0–17.0)
Lymphocytes Relative: 9 %
Lymphocytes Relative: 16 %
Lymphs Abs: 1.3 10*3/uL (ref 0.7–4.0)
Lymphs Abs: 2.3 10*3/uL (ref 0.7–4.0)
MCH: 27.9 pg (ref 26.0–34.0)
MCH: 27.7 pg (ref 26.0–34.0)
MCHC: 32.1 g/dL (ref 30.0–36.0)
MCHC: 32.3 g/dL (ref 30.0–36.0)
MCV: 87.1 fL (ref 78.0–100.0)
MCV: 85.6 fL (ref 78.0–100.0)
MONO ABS: 0.9 10*3/uL (ref 0.1–1.0)
Monocytes Absolute: 0.8 10*3/uL (ref 0.1–1.0)
Monocytes Relative: 5 %
Monocytes Relative: 6 %
NEUTROS ABS: 11 10*3/uL — AB (ref 1.7–7.7)
NEUTROS PCT: 86 %
NEUTROS PCT: 77 %
Neutro Abs: 12.5 10*3/uL — ABNORMAL HIGH (ref 1.7–7.7)
PLATELETS: 307 10*3/uL (ref 150–400)
Platelets: 315 10*3/uL (ref 150–400)
RBC: 5.26 MIL/uL (ref 4.22–5.81)
RBC: 4.73 MIL/uL (ref 4.22–5.81)
RDW: 17.5 % — ABNORMAL HIGH (ref 11.5–15.5)
RDW: 17.4 % — AB (ref 11.5–15.5)
WBC: 14.6 10*3/uL — AB (ref 4.0–10.5)
WBC: 14.3 10*3/uL — AB (ref 4.0–10.5)

## 2015-07-20 LAB — BASIC METABOLIC PANEL
ANION GAP: 6 (ref 5–15)
BUN: 18 mg/dL (ref 6–20)
CHLORIDE: 102 mmol/L (ref 101–111)
CO2: 34 mmol/L — ABNORMAL HIGH (ref 22–32)
Calcium: 8.8 mg/dL — ABNORMAL LOW (ref 8.9–10.3)
Creatinine, Ser: 1.21 mg/dL (ref 0.61–1.24)
GFR calc non Af Amer: >60 mL/min (ref >60)
Glucose, Bld: 344 mg/dL — ABNORMAL HIGH (ref 65–99)
POTASSIUM: 4.4 mmol/L (ref 3.5–5.1)
SODIUM: 142 mmol/L (ref 135–145)

## 2015-07-20 LAB — COMPREHENSIVE METABOLIC PANEL
ALT: 22 U/L (ref 17–63)
ANION GAP: 12 (ref 5–15)
AST: 33 U/L (ref 15–41)
Albumin: 3.9 g/dL (ref 3.5–5.0)
Alkaline Phosphatase: 96 U/L (ref 38–126)
BILIRUBIN TOTAL: 1.6 mg/dL — AB (ref 0.3–1.2)
BUN: 23 mg/dL — ABNORMAL HIGH (ref 6–20)
CHLORIDE: 96 mmol/L — AB (ref 101–111)
CO2: 28 mmol/L (ref 22–32)
Calcium: 8.8 mg/dL — ABNORMAL LOW (ref 8.9–10.3)
Creatinine, Ser: 1.45 mg/dL — ABNORMAL HIGH (ref 0.61–1.24)
GFR calc non Af Amer: 57 mL/min — ABNORMAL LOW (ref >60)
Glucose, Bld: 673 mg/dL (ref 65–99)
POTASSIUM: 5.1 mmol/L (ref 3.5–5.1)
Sodium: 136 mmol/L (ref 135–145)
Total Protein: 8.4 g/dL — ABNORMAL HIGH (ref 6.5–8.1)

## 2015-07-20 LAB — I-STAT CHEM 8, ED
BUN: 25 mg/dL — ABNORMAL HIGH (ref 6–20)
CALCIUM ION: 1.12 mmol/L (ref 1.12–1.23)
CHLORIDE: 95 mmol/L — AB (ref 101–111)
CREATININE: 1.3 mg/dL — AB (ref 0.61–1.24)
GLUCOSE: 692 mg/dL — AB (ref 65–99)
HCT: 53 % — ABNORMAL HIGH (ref 39.0–52.0)
Hemoglobin: 18 g/dL — ABNORMAL HIGH (ref 13.0–17.0)
Potassium: 5.1 mmol/L (ref 3.5–5.1)
Sodium: 137 mmol/L (ref 135–145)
TCO2: 30 mmol/L (ref 0–100)

## 2015-07-20 LAB — TROPONIN I: Troponin I: 0.04 ng/mL — ABNORMAL HIGH (ref <0.031)

## 2015-07-20 LAB — URINALYSIS, ROUTINE W REFLEX MICROSCOPIC
BILIRUBIN URINE: NEGATIVE
BILIRUBIN URINE: NEGATIVE
Glucose, UA: >1000 mg/dL — AB
Glucose, UA: 100 mg/dL — AB
Hgb urine dipstick: NEGATIVE
Hgb urine dipstick: NEGATIVE
KETONES UR: NEGATIVE mg/dL
Ketones, ur: NEGATIVE mg/dL
Leukocytes, UA: NEGATIVE
Leukocytes, UA: NEGATIVE
NITRITE: NEGATIVE
Nitrite: NEGATIVE
PH: 5.5 (ref 5.0–8.0)
Protein, ur: 30 mg/dL — AB
Protein, ur: NEGATIVE mg/dL
SPECIFIC GRAVITY, URINE: 1.027 (ref 1.005–1.030)
SPECIFIC GRAVITY, URINE: 1.008 (ref 1.005–1.030)
UROBILINOGEN UA: 1 mg/dL (ref 0.0–1.0)
UROBILINOGEN UA: 0.2 mg/dL (ref 0.0–1.0)
pH: 5 (ref 5.0–8.0)

## 2015-07-20 LAB — URINE MICROSCOPIC-ADD ON

## 2015-07-20 LAB — MAGNESIUM: MAGNESIUM: 1.9 mg/dL (ref 1.7–2.4)

## 2015-07-20 LAB — LIPASE, BLOOD: LIPASE: 21 U/L — AB (ref 22–51)

## 2015-07-20 LAB — CBG MONITORING, ED
GLUCOSE-CAPILLARY: 538 mg/dL — AB (ref 65–99)
GLUCOSE-CAPILLARY: 480 mg/dL — AB (ref 65–99)

## 2015-07-20 LAB — MRSA PCR SCREENING: MRSA by PCR: NEGATIVE

## 2015-07-20 LAB — BRAIN NATRIURETIC PEPTIDE: B Natriuretic Peptide: 312.7 pg/mL — ABNORMAL HIGH (ref 0.0–100.0)

## 2015-07-20 LAB — GLUCOSE, CAPILLARY: Glucose-Capillary: 454 mg/dL — ABNORMAL HIGH (ref 65–99)

## 2015-07-20 LAB — PROCALCITONIN: PROCALCITONIN: 0.29 ng/mL

## 2015-07-20 LAB — PROTIME-INR
INR: 1.32 (ref 0.00–1.49)
PROTHROMBIN TIME: 16.5 s — AB (ref 11.6–15.2)

## 2015-07-20 LAB — TRIGLYCERIDES: Triglycerides: 174 mg/dL — ABNORMAL HIGH (ref <150)

## 2015-07-20 LAB — PHOSPHORUS: PHOSPHORUS: 2.5 mg/dL (ref 2.5–4.6)

## 2015-07-20 MED ORDER — FUROSEMIDE 10 MG/ML IJ SOLN
80.0000 mg | Freq: Three times a day (TID) | INTRAMUSCULAR | Status: DC
  Administered 2015-07-20 – 2015-07-22 (×6): 80 mg via INTRAVENOUS
  Filled 2015-07-20 (×7): qty 8

## 2015-07-20 MED ORDER — VANCOMYCIN HCL 10 G IV SOLR
1250.0000 mg | Freq: Two times a day (BID) | INTRAVENOUS | Status: DC
  Administered 2015-07-21 – 2015-07-23 (×5): 1250 mg via INTRAVENOUS
  Filled 2015-07-20 (×5): qty 1250

## 2015-07-20 MED ORDER — ETOMIDATE 2 MG/ML IV SOLN
INTRAVENOUS | Status: AC
  Administered 2015-07-20: 17:00:00
  Filled 2015-07-20: qty 20

## 2015-07-20 MED ORDER — SODIUM CHLORIDE 0.9 % IV SOLN
INTRAVENOUS | Status: DC
  Filled 2015-07-20: qty 2.5

## 2015-07-20 MED ORDER — SODIUM CHLORIDE 0.9 % IV SOLN
INTRAVENOUS | Status: DC
  Administered 2015-07-20: 16:00:00 via INTRAVENOUS

## 2015-07-20 MED ORDER — INSULIN REGULAR BOLUS VIA INFUSION
0.0000 [IU] | Freq: Three times a day (TID) | INTRAVENOUS | Status: DC
  Filled 2015-07-20: qty 10

## 2015-07-20 MED ORDER — FENTANYL CITRATE (PF) 100 MCG/2ML IJ SOLN: 100.0000 ug | INTRAMUSCULAR | Status: DC | PRN

## 2015-07-20 MED ORDER — PROPOFOL 10 MG/ML IV BOLUS
100.0000 mg | Freq: Once | INTRAVENOUS | Status: AC
  Administered 2015-07-20: 100 mg via INTRAVENOUS

## 2015-07-20 MED ORDER — ALBUTEROL SULFATE (2.5 MG/3ML) 0.083% IN NEBU: 2.5000 mg | INHALATION_SOLUTION | Freq: Four times a day (QID) | RESPIRATORY_TRACT | Status: DC | PRN

## 2015-07-20 MED ORDER — ONDANSETRON HCL 4 MG/2ML IJ SOLN: 4.0000 mg | Freq: Four times a day (QID) | INTRAMUSCULAR | Status: DC | PRN

## 2015-07-20 MED ORDER — FENTANYL CITRATE (PF) 100 MCG/2ML IJ SOLN
100.0000 ug | INTRAMUSCULAR | Status: DC | PRN
  Filled 2015-07-20: qty 2

## 2015-07-20 MED ORDER — RANITIDINE HCL 150 MG/10ML PO SYRP
150.0000 mg | ORAL_SOLUTION | Freq: Two times a day (BID) | ORAL | Status: DC
  Administered 2015-07-20 – 2015-07-23 (×5): 150 mg
  Filled 2015-07-20 (×8): qty 10

## 2015-07-20 MED ORDER — FENTANYL CITRATE (PF) 100 MCG/2ML IJ SOLN
INTRAMUSCULAR | Status: AC
  Filled 2015-07-20: qty 2

## 2015-07-20 MED ORDER — HEPARIN SODIUM (PORCINE) 5000 UNIT/ML IJ SOLN
5000.0000 [IU] | Freq: Three times a day (TID) | INTRAMUSCULAR | Status: DC
  Administered 2015-07-20 – 2015-07-21 (×2): 5000 [IU] via SUBCUTANEOUS
  Filled 2015-07-20 (×4): qty 1

## 2015-07-20 MED ORDER — BISACODYL 10 MG RE SUPP: 10.0000 mg | Freq: Every day | RECTAL | Status: DC | PRN

## 2015-07-20 MED ORDER — VANCOMYCIN HCL 10 G IV SOLR
2500.0000 mg | Freq: Once | INTRAVENOUS | Status: AC
  Administered 2015-07-20: 2500 mg via INTRAVENOUS
  Filled 2015-07-20: qty 1000

## 2015-07-20 MED ORDER — ROCURONIUM BROMIDE 50 MG/5ML IV SOLN
INTRAVENOUS | Status: AC
  Filled 2015-07-20: qty 2

## 2015-07-20 MED ORDER — SODIUM CHLORIDE 0.9 % IV SOLN: 250.0000 mL | INTRAVENOUS | Status: DC | PRN

## 2015-07-20 MED ORDER — STERILE WATER FOR INJECTION IJ SOLN
INTRAMUSCULAR | Status: AC
  Administered 2015-07-20: 17:00:00
  Filled 2015-07-20: qty 10

## 2015-07-20 MED ORDER — LIDOCAINE HCL (CARDIAC) 20 MG/ML IV SOLN
INTRAVENOUS | Status: AC
  Filled 2015-07-20: qty 5

## 2015-07-20 MED ORDER — PIPERACILLIN-TAZOBACTAM 3.375 G IVPB
3.3750 g | Freq: Three times a day (TID) | INTRAVENOUS | Status: DC
  Administered 2015-07-20 – 2015-07-23 (×8): 3.375 g via INTRAVENOUS
  Filled 2015-07-20 (×8): qty 50

## 2015-07-20 MED ORDER — PROPOFOL 1000 MG/100ML IV EMUL
INTRAVENOUS | Status: AC
  Filled 2015-07-20: qty 100

## 2015-07-20 MED ORDER — SUCCINYLCHOLINE CHLORIDE 20 MG/ML IJ SOLN
INTRAMUSCULAR | Status: AC
  Filled 2015-07-20: qty 1

## 2015-07-20 MED ORDER — SODIUM CHLORIDE 0.9 % IV SOLN
INTRAVENOUS | Status: DC
  Administered 2015-07-20: 5.4 [IU]/h via INTRAVENOUS
  Administered 2015-07-22: 4.8 [IU]/h via INTRAVENOUS
  Filled 2015-07-20: qty 2.5

## 2015-07-20 MED ORDER — DEXTROSE-NACL 5-0.45 % IV SOLN
INTRAVENOUS | Status: DC
  Administered 2015-07-21: 05:00:00 via INTRAVENOUS

## 2015-07-20 MED ORDER — IPRATROPIUM-ALBUTEROL 0.5-2.5 (3) MG/3ML IN SOLN: 3.0000 mL | Freq: Once | RESPIRATORY_TRACT | Status: DC

## 2015-07-20 MED ORDER — FENTANYL CITRATE (PF) 100 MCG/2ML IJ SOLN
100.0000 ug | Freq: Once | INTRAMUSCULAR | Status: AC
  Administered 2015-07-20: 100 ug via INTRAVENOUS

## 2015-07-20 MED ORDER — ACETAMINOPHEN 325 MG PO TABS
650.0000 mg | ORAL_TABLET | ORAL | Status: DC | PRN
  Administered 2015-07-22 – 2015-07-23 (×2): 650 mg via ORAL
  Filled 2015-07-20 (×2): qty 2

## 2015-07-20 MED ORDER — PROPOFOL 1000 MG/100ML IV EMUL
5.0000 ug/kg/min | INTRAVENOUS | Status: DC
  Administered 2015-07-20: 20 ug/kg/min via INTRAVENOUS
  Administered 2015-07-20: 25 ug/kg/min via INTRAVENOUS
  Administered 2015-07-21 (×2): 15 ug/kg/min via INTRAVENOUS
  Administered 2015-07-21: 25 ug/kg/min via INTRAVENOUS
  Administered 2015-07-21 (×7): 35 ug/kg/min via INTRAVENOUS
  Administered 2015-07-22: 20.05 ug/kg/min via INTRAVENOUS
  Administered 2015-07-22: 35 ug/kg/min via INTRAVENOUS
  Administered 2015-07-22: 30 ug/kg/min via INTRAVENOUS
  Administered 2015-07-22: 20 ug/kg/min via INTRAVENOUS
  Administered 2015-07-22: 35 ug/kg/min via INTRAVENOUS
  Filled 2015-07-20 (×2): qty 100
  Filled 2015-07-20: qty 300
  Filled 2015-07-20 (×2): qty 100
  Filled 2015-07-20: qty 200
  Filled 2015-07-20 (×2): qty 100
  Filled 2015-07-20: qty 200
  Filled 2015-07-20 (×4): qty 100

## 2015-07-20 MED ORDER — BENZOCAINE 20 % MT SOLN
OROMUCOSAL | Status: AC
  Administered 2015-07-20: 16:00:00
  Filled 2015-07-20: qty 57

## 2015-07-20 MED ORDER — ASPIRIN 300 MG RE SUPP: 300.0000 mg | RECTAL | Status: AC

## 2015-07-20 MED ORDER — ASPIRIN 81 MG PO CHEW: 324.0000 mg | CHEWABLE_TABLET | ORAL | Status: AC

## 2015-07-20 MED ORDER — PIPERACILLIN-TAZOBACTAM 3.375 G IVPB 30 MIN
3.3750 g | Freq: Once | INTRAVENOUS | Status: AC
  Administered 2015-07-20: 3.375 g via INTRAVENOUS
  Filled 2015-07-20: qty 50

## 2015-07-20 MED ORDER — LORAZEPAM 2 MG/ML IJ SOLN
INTRAMUSCULAR | Status: AC
  Administered 2015-07-20: 16:00:00
  Filled 2015-07-20: qty 1

## 2015-07-20 MED ORDER — FAMOTIDINE 40 MG/5ML PO SUSR
20.0000 mg | Freq: Two times a day (BID) | ORAL | Status: DC
  Filled 2015-07-20: qty 2.5

## 2015-07-20 MED ORDER — SENNOSIDES 8.8 MG/5ML PO SYRP
5.0000 mL | ORAL_SOLUTION | Freq: Two times a day (BID) | ORAL | Status: DC | PRN
  Filled 2015-07-20: qty 5

## 2015-07-20 MED ORDER — DEXTROSE 50 % IV SOLN: 25.0000 mL | INTRAVENOUS | Status: DC | PRN

## 2015-07-20 MED ORDER — PROPOFOL 1000 MG/100ML IV EMUL
0.0000 ug/kg/min | INTRAVENOUS | Status: DC
  Administered 2015-07-21 (×2): 35 ug/kg/min via INTRAVENOUS
  Filled 2015-07-20 (×2): qty 100

## 2015-07-20 NOTE — ED Notes (Signed)
Per EMS: Pt from home.  Morbidly obese.  Pt has been unable to get out of his bed for 4 days.  Pt was laying on a mattress on the floor, one side of the mattress contained empty [employer] wrappers and the other was completely saturated to the floor with urine and feces.  Pt covered in excrement on arrival.  Pt taken to decon room.  Pt was 77% on RA.  C/o SOB with CP.  CBG 564.  12 unremarkable, just sinus tach.

## 2015-07-20 NOTE — Procedures (Signed)
Intubation Procedure Note [name] J Chaudhari [id number] 02/22/1971  Procedure: Intubation Indications: Respiratory insufficiency  Procedure Details Consent: Risks of procedure as well as the alternatives and risks of each were explained to the (patient/caregiver).  Consent for procedure obtained. Time Out: Verified patient identification, verified procedure, site/side was marked, verified correct patient position, special equipment/implants available, medications/allergies/relevent history reviewed, required imaging and test results available.  Performed  Maximum sterile technique was used including cap, gloves, gown, hand hygiene and mask.  MAC and 4    Evaluation Hemodynamic Status: BP stable throughout; O2 sats: stable throughout Patient's Current Condition: stable Complications: No apparent complications Patient did tolerate procedure well. Chest X-ray ordered to verify placement.  CXR: pending.   [name],Evan J. 07/20/2015  Awake intubation Glide - massive vomit throughout, fungal>? Sprayed throat, then etomidate once ett passed cords  Evan Rossettianiel J. [name] [name], MD, FACP Pgr: [phone] [place]]

## 2015-07-20 NOTE — Procedures (Signed)
Central Venous Catheter Insertion Procedure Note Maurice Marchaul J Roettger [id number] 06/04/1971  Procedure: Insertion of Central Venous Catheter Indications: Assessment of intravascular volume, Drug and/or fluid administration and Frequent blood sampling  Procedure Details Consent: Risks of procedure as well as the alternatives and risks of each were explained to the (patient/caregiver).  Consent for procedure obtained. Time Out: Verified patient identification, verified procedure, site/side was marked, verified correct patient position, special equipment/implants available, medications/allergies/relevent history reviewed, required imaging and test results available.  Performed  Maximum sterile technique was used including antiseptics, cap, gloves, gown, hand hygiene, mask and sheet. Skin prep: Chlorhexidine; local anesthetic administered A antimicrobial bonded/coated triple lumen catheter was placed in the right internal jugular vein using the Seldinger technique.  Evaluation Blood flow good Complications: No apparent complications Patient did tolerate procedure well. Chest X-ray ordered to verify placement.  CXR: pending.  Nelda BucksFEINSTEIN,DANIEL J. 07/20/2015, 4:48 PM  US Stat Emergent No access  [place] Rossettianiel J. Tyson [name], MD, FACP Pgr: [phone] [place] [facility]

## 2015-07-20 NOTE — ED Provider Notes (Signed)
CSN: [id number]     Arrival date & time 07/20/15  1257 History   First MD Initiated Contact with Patient 07/20/15 1306     Chief Complaint  Patient presents with  . Shortness of Breath  . Hyperglycemia     (Consider location/radiation/quality/duration/timing/severity/associated sxs/prior Treatment) HPI Patient reports that he's been getting short of breath over the course of about a week. He reports he has had some amount of coughing but not excessively so. He denies he has been experiencing chest pain. He reports he was doing okay but last night he failed to put on his C Pap. He reports he was visiting with a friend and he thinks he just forgot to do it. Today he's been more short of breath and weak. He reports when the home nurse came to check on him today, she also noted that his feet were more swollen. Past Medical History  Diagnosis Date  . CHF (congestive heart failure) (HCC)     EF55-60%  . HTN (hypertension)   . Ptosis   . Atrial flutter (HCC)   . Atrial fibrillation (HCC)   . Morbid obesity (HCC)   . DM2 (diabetes mellitus, type 2) (HCC)   . Depression   . Chronic low back pain   . Asthma   . Vision abnormalities   . Abscess     right thigh  . Sleep apnea   . Chronic respiratory failure (HCC)   . Obesity hypoventilation syndrome [facility])    Past Surgical History  Procedure Laterality Date  . Tonsillectomy    . Cholecystectomy    . Tonsillectomy    . Irrigation and debridement abscess  10/13/2011    Procedure: IRRIGATION AND DEBRIDEMENT ABSCESS;  Surgeon: Emelia Loron, MD;  Location: [facility] OR;  Service: General;  Laterality: Right;  Irrigation and debridement right thigh abscess  . Application of wound vac      right thigh abscess  . Breath tek h pylori N/A 10/06/2014    Procedure: BREATH TEK H PYLORI;  Surgeon: Valarie Merino, MD;  Location: [facility];  Service: General;  Laterality: N/A;  . Patellar tendon repair Left 12/21/2014    Procedure: PATELLA TENDON  REPAIR;  Surgeon: Sheral Apley, MD;  Location: [facility] OR;  Service: Orthopedics;  Laterality: Left;   Family History  Problem Relation Age of Onset  . Coronary artery disease Mother     42s; father had it in 53s; both deceased in their 29s    . Cancer Mother     liver  . Heart disease Father   . Cancer Father     prostate   Social History  Substance Use Topics  . Smoking status: Never Smoker   . Smokeless tobacco: None     Comment: no significant tobacco use   . Alcohol Use: 0.6 oz/week    1 Standard drinks or equivalent per week     Comment: no significant use-- rare    Review of Systems  10 Systems reviewed and are negative for acute change except as noted in the HPI.   Allergies  Iodine and Shellfish allergy  Home Medications   Prior to Admission medications   Medication Sig Start Date End Date Taking? Authorizing Provider  albuterol (VENTOLIN HFA) 108 (90 BASE) MCG/ACT inhaler Inhale 2 puffs into the lungs every 6 (six) hours as needed. Shortness of breath Patient taking differently: Inhale 2 puffs into the lungs every 6 (six) hours as needed for wheezing or shortness of breath.  03/30/14   Alison Murray, MD  ALPRAZolam Prudy Feeler) 0.5 MG tablet Take 1 tablet (0.5 mg total) by mouth daily as needed for anxiety. 12/23/14   Shanker Levora Dredge, MD  amiodarone (PACERONE) 200 MG tablet Take 200 mg by mouth daily.    Historical Provider, MD  buPROPion (WELLBUTRIN XL) 300 MG 24 hr tablet Take 300 mg by mouth Daily.  03/13/12   Historical Provider, MD  cyclobenzaprine (FLEXERIL) 10 MG tablet Take 10 mg by mouth 3 (three) times daily as needed for muscle spasms.    Historical Provider, MD  diltiazem (DILACOR XR) 240 MG 24 hr capsule Take 240 mg by mouth daily.    Historical Provider, MD  divalproex (DEPAKOTE) 500 MG DR tablet Take 1,000 mg by mouth daily.     Historical Provider, MD  furosemide (LASIX) 40 MG tablet Take 40 mg by mouth daily.    Historical Provider, MD  hydrALAZINE  (APRESOLINE) 10 MG tablet Take 1 tablet (10 mg total) by mouth every 8 (eight) hours. 03/30/14   Alison Murray, MD  insulin aspart (NOVOLOG) 100 UNIT/ML injection Sliding scale   CBG 70 - 120: 0 units:  CBG 121 - 150: 2 units;  CBG 151 - 200: 3 units;  CBG 201 - 250: 5 units;  CBG 251 - 300: 8 units; CBG 301 - 350: 11 units; CBG 351 - 400: 15 units;   Use while on steroids/prednisone. Patient not taking: Reported on 05/09/2015 11/29/14   Nita Sells Mikhail, DO  insulin lispro protamine-lispro (HUMALOG 75/25 MIX) (75-25) 100 UNIT/ML SUSP injection Inject 40-75 Units into the skin 3 (three) times daily before meals. Inject 75 units with breakfast, 40 units with lunch, 75 units with supper    Historical Provider, MD  lamoTRIgine (LAMICTAL) 25 MG tablet Take 25 mg by mouth at bedtime.     Historical Provider, MD  metoprolol (TOPROL-XL) 50 MG 24 hr tablet Take 50 mg by mouth daily. 05/10/11   Lewayne Bunting, MD  Naftifine HCl (NAFTIN) 2 % CREA Apply 1 application topically daily. Apply to feet.    Historical Provider, MD  nitroGLYCERIN (NITROSTAT) 0.4 MG SL tablet Place 0.4 mg under the tongue every 5 (five) minutes as needed for chest pain.     Historical Provider, MD  oxyCODONE-acetaminophen (PERCOCET) 5-325 MG per tablet Take 1-2 tablets by mouth every 4 (four) hours as needed for severe pain. Patient taking differently: Take 1 tablet by mouth daily as needed for severe pain.  12/23/14   Shanker Levora Dredge, MD  polyethylene glycol (MIRALAX / Ethelene Hal) packet Take 17 g by mouth daily as needed (constipation).    Historical Provider, MD  ramelteon (ROZEREM) 8 MG tablet Take 8 mg by mouth at bedtime as needed for sleep.    Historical Provider, MD  rivaroxaban (XARELTO) 20 MG TABS tablet Take 1 tablet (20 mg total) by mouth daily with supper. Patient taking differently: Take 20 mg by mouth daily with breakfast.  06/26/14   Simonne Martinet, NP  senna (SENOKOT) 8.6 MG TABS tablet Take 1 tablet (8.6 mg total) by  mouth 2 (two) times daily. Patient not taking: Reported on 05/09/2015 12/23/14   Maretta Bees, MD   BP 167/91 mmHg  Pulse 125  Resp 32  Wt 645 lb (292.57 kg)  SpO2 96% Physical Exam  Constitutional:  Patient is morbidly obese. He has moderate increased work of breathing. Patient is speaking in short full sentences. He is oriented.  HENT:  Head: Normocephalic and atraumatic.  Nose: Nose normal.  Tongue has some white plaque on it.  Eyes:  Patient has bilateral lid edema. Right greater than left. There is some crusting in the lashes on the right.  Cardiovascular:  Heart rate is tachycardic. Secondary to respiratory noise and patient body habitus, I cannot appreciate specific rub murmur or gallop.  Pulmonary/Chest:  Patient has tachypnea. Body habitus is very large and obscures chest wall motion. I cannot appreciate adequate breath sounds to discern Rales or rhonchi.  Abdominal:  Abdomen is morbidly obese. The pannus is firm.  Musculoskeletal: He exhibits edema.  Neurological:  Patient is somnolent but awakens to light voice. He is oriented 3. He follows commands to move but is very limited by body habitus.  Skin: Skin is warm and dry.    ED Course  Procedures (including critical care time) Labs Review Labs Reviewed  COMPREHENSIVE METABOLIC PANEL - Abnormal; Notable for the following:    Chloride 96 (*)    Glucose, Bld 673 (*)    BUN 23 (*)    Creatinine, Ser 1.45 (*)    Calcium 8.8 (*)    Total Protein 8.4 (*)    Total Bilirubin 1.6 (*)    GFR calc non Af Amer 57 (*)    All other components within normal limits  BRAIN NATRIURETIC PEPTIDE - Abnormal; Notable for the following:    B Natriuretic Peptide 312.7 (*)    All other components within normal limits  LIPASE, BLOOD - Abnormal; Notable for the following:    Lipase 21 (*)    All other components within normal limits  TROPONIN I - Abnormal; Notable for the following:    Troponin I 0.04 (*)    All other components  within normal limits  CBC WITH DIFFERENTIAL/PLATELET - Abnormal; Notable for the following:    WBC 14.6 (*)    RDW 17.5 (*)    Neutro Abs 12.5 (*)    All other components within normal limits  PROTIME-INR - Abnormal; Notable for the following:    Prothrombin Time 16.5 (*)    All other components within normal limits  URINALYSIS, ROUTINE W REFLEX MICROSCOPIC (NOT AT [facility]) - Abnormal; Notable for the following:    Glucose, UA >1000 (*)    Protein, ur 30 (*)    All other components within normal limits  BLOOD GAS, ARTERIAL - Abnormal; Notable for the following:    pH, Arterial 7.225 (*)    pCO2 arterial 78.5 (*)    pO2, Arterial 74.9 (*)    Bicarbonate 31.3 (*)    All other components within normal limits  I-STAT CHEM 8, ED - Abnormal; Notable for the following:    Chloride 95 (*)    BUN 25 (*)    Creatinine, Ser 1.30 (*)    Glucose, Bld 692 (*)    Hemoglobin 18.0 (*)    HCT 53.0 (*)    All other components within normal limits  CULTURE, BLOOD (ROUTINE X 2)  CULTURE, BLOOD (ROUTINE X 2)  URINE MICROSCOPIC-ADD ON  I-STAT CG4 LACTIC ACID, ED    Imaging Review Dg Chest Port 1 View  07/20/2015  CLINICAL DATA:  Shortness of breath. EXAM: PORTABLE CHEST 1 VIEW COMPARISON:  May 19, 2015. FINDINGS: Stable cardiomegaly. No pneumothorax is noted. Central pulmonary vascular congestion is noted. Increased bilateral lung opacities are noted concerning for edema or possibly pneumonia, with right greater than left. Lung bases are not well visualized due to body habitus.  Visualized portion of bony thorax is intact. IMPRESSION: Central pulmonary vascular congestion is noted with bilateral lung opacities concerning for edema or pneumonia. Images are limited due to body habitus. Electronically Signed   By: Lupita RaiderJames  Green Jr, M.D.   On: 07/20/2015 14:02   I have personally reviewed and evaluated these images and lab results as part of my medical decision-making.   EKG Interpretation   Date/Time:   Tuesday July 20 2015 13:01:32 EDT Ventricular Rate:  128 PR Interval:  84 QRS Duration: 93 QT Interval:  328 QTC Calculation: 479 R Axis:   -158 Text Interpretation:  Sinus tachycardia Anterolateral infarct, old  INCREASED COVING ST BUT PRESENT ON OLD.  Confirmed by Donnald GarrePfeiffer, MD, Lebron ConnersMarcy  [phone]) on 07/20/2015 1:08:31 PM     Consult (15:12): Patient's case was reviewed Dr. Meda KlinefelterYacomb. We reviewed diagnostic results and patient clinical condition. At this time we will continue with BiPAP and he will be assessed by critical care team in the emergency department.  CRITICAL CARE Performed by: Arby BarrettePfeiffer, Malarie Tappen   Total critical care time: 45  Critical care time was exclusive of separately billable procedures and treating other patients.  Critical care was necessary to treat or prevent imminent or life-threatening deterioration.  Critical care was time spent personally by me on the following activities: development of treatment plan with patient and/or surrogate as well as nursing, discussions with consultants, evaluation of patient's response to treatment, examination of patient, obtaining history from patient or surrogate, ordering and performing treatments and interventions, ordering and review of laboratory studies, ordering and review of radiographic studies, pulse oximetry and re-evaluation of patient's condition. MDM   Final diagnoses:  Healthcare-associated pneumonia  Acute on chronic respiratory failure with hypercapnia (HCC)  Hyperosmolar non-ketotic state in patient with type 2 diabetes mellitus (HCC)   Patient arrived with hypoxia and complains of dyspnea. Findings were suggestive of combined hypoventilation hypercapnia and probable pneumonia. Patient was placed on BiPAP and antibiotics initiated. Insulin drip also ordered for hyperglycemic hyperosmolar state. The patient did maintaining intact mental status able to identify his surroundings and specifically requested trial of BiPAP  before moving directly to intubation. The patient's case was reviewed with Dr. Meda KlinefelterYacomb, with plan for critical care physician evaluation in the emergency department to determine ongoing management with BiPAP versus intubation. Dr. Tyson [name] came to the emergency department and evaluated the patient. At that time he assumed care and proceeded to intubate the patient  for ICU admission.    Arby BarretteMarcy Linton Stolp, MD 07/20/15 [phone]

## 2015-07-20 NOTE — Progress Notes (Signed)
Utilization Review completed.  Teren Franckowiak RN CM  

## 2015-07-20 NOTE — Consult Note (Addendum)
   [facility]   07/20/2015  Maurice Marchaul J Vaca 12/20/1970 [id number]   [facility] patient coming to the ED despite efforts to get patient to see Primary Care MD today at 2 pm. Call was made to confirm appointment was cancelled. Please see [facility] under chart review and notes in EPIC. Made ED RNCM aware that [facility]. [facility] also informed Clinical research associatewriter that APS was called for patient's living conditions. [facility] RNCM also reports patient has been to SNF in the past. If patient qualifies and is agreeable, SNF may be a better option for him at discharge, if he is admitted to hospital. Will continue to follow.   Addendum: Came to ED to visit patient. He was just intubated. Will continue to follow. Unable to speak with patient at this time.  Raiford NobleAtika Haile Bosler, MSN-Ed, RN,BSN [facility] Hospital Liaison [phone]

## 2015-07-20 NOTE — ED Notes (Signed)
X RAY AT THE BEDSIDE

## 2015-07-20 NOTE — H&P (Signed)
PULMONARY / CRITICAL CARE MEDICINE   Name: [name] MRN: [id number] DOB: 10-Aug-1971    ADMISSION DATE:  07/20/2015 CONSULTATION DATE:  07/20/15   REFERRING MD :  EDP   CHIEF COMPLAINT:  Hypercarbic Respiratory Failure  INITIAL PRESENTATION: 44 y/o M with PMH of morbid obesity, CHF, OSH/OSA who presented to [facility] via EMS on 10/18 with complaints of shortness of breath with chest pain.  Failed bipap therapy and was intubated.    STUDIES:    SIGNIFICANT EVENTS: 10/18  Admit with SOB & chest pain.  Failed bipap therapy in ER and was intubated (awake per Dr. Tyson Alias)   HISTORY OF PRESENT ILLNESS:  The patient is altered on BiPAP -information obtained from staff and chart review.   44 y/o M with a PMH of morbid obesity, CHF, HTN, atrial fibrillation/flutter, DM 2, depression, chronic respiratory failure secondary to obesity hypoventilation syndrome who presented to [facility] on 10/18 via EMS with complaints of shortness of breath and chest pain.  ER notes reflect that the patient activated EMS for complaints of chest pain and shortness of breath. On arrival to the patient's home he was apparently lying on a mattress on the floor. One side of the mattress contained into [employer] wrappers and the other side was completely saturated with urine and feces. He was covered in next permanent on arrival to the ER and was taken to the decontamination room. Initial room air saturations were noted to be 77%. CBG 564.  He reportedly wears CPAP however on arrival stated he did not wear it last night. Apparently a home nurse came to check in on him the day of admission and noted that his legs were more swollen than usual.  ABG was assessed with pH of 7.22 / 78.5 / 74.9 / 31.3 .  He was placed on BiPAP therapy.  The patient became progressively more somnolent. Critical care medicine was consulted for evaluation.  After evaluation, decision was made to perform an awake intubation in the ER.  Central line was  placed in the setting of poor venous access.     PAST MEDICAL HISTORY :   has a past medical history of CHF (congestive heart failure) (HCC); HTN (hypertension); Ptosis; Atrial flutter (HCC); Atrial fibrillation (HCC); Morbid obesity (HCC); DM2 (diabetes mellitus, type 2) (HCC); Depression; Chronic low back pain; Asthma; Vision abnormalities; Abscess; Sleep apnea; Chronic respiratory failure (HCC); and Obesity hypoventilation syndrome (HCC).  has past surgical history that includes Tonsillectomy; Cholecystectomy; Tonsillectomy; Irrigation and debridement abscess (10/13/2011); Application if wound vac; Breath tek h pylori (N/A, 10/06/2014); and Patellar tendon repair (Left, 12/21/2014).   Prior to Admission medications   Medication Sig Start Date End Date Taking? Authorizing Provider  albuterol (VENTOLIN HFA) 108 (90 BASE) MCG/ACT inhaler Inhale 2 puffs into the lungs every 6 (six) hours as needed. Shortness of breath Patient taking differently: Inhale 2 puffs into the lungs every 6 (six) hours as needed for wheezing or shortness of breath.  03/30/14   Alison Murray, MD  ALPRAZolam Prudy Feeler) 0.5 MG tablet Take 1 tablet (0.5 mg total) by mouth daily as needed for anxiety. 12/23/14   Shanker Levora Dredge, MD  amiodarone (PACERONE) 200 MG tablet Take 200 mg by mouth daily.    Historical Provider, MD  buPROPion (WELLBUTRIN XL) 300 MG 24 hr tablet Take 300 mg by mouth Daily.  03/13/12   Historical Provider, MD  cyclobenzaprine (FLEXERIL) 10 MG tablet Take 10 mg by mouth 3 (three) times daily as needed  for muscle spasms.    Historical Provider, MD  diltiazem (DILACOR XR) 240 MG 24 hr capsule Take 240 mg by mouth daily.    Historical Provider, MD  divalproex (DEPAKOTE) 500 MG DR tablet Take 1,000 mg by mouth daily.     Historical Provider, MD  furosemide (LASIX) 40 MG tablet Take 40 mg by mouth daily.    Historical Provider, MD  hydrALAZINE (APRESOLINE) 10 MG tablet Take 1 tablet (10 mg total) by mouth every 8 (eight)  hours. 03/30/14   Alison Murray, MD  insulin aspart (NOVOLOG) 100 UNIT/ML injection Sliding scale   CBG 70 - 120: 0 units:  CBG 121 - 150: 2 units;  CBG 151 - 200: 3 units;  CBG 201 - 250: 5 units;  CBG 251 - 300: 8 units; CBG 301 - 350: 11 units; CBG 351 - 400: 15 units;   Use while on steroids/prednisone. Patient not taking: Reported on 05/09/2015 11/29/14   Nita Sells Mikhail, DO  insulin lispro protamine-lispro (HUMALOG 75/25 MIX) (75-25) 100 UNIT/ML SUSP injection Inject 40-75 Units into the skin 3 (three) times daily before meals. Inject 75 units with breakfast, 40 units with lunch, 75 units with supper    Historical Provider, MD  lamoTRIgine (LAMICTAL) 25 MG tablet Take 25 mg by mouth at bedtime.     Historical Provider, MD  metoprolol (TOPROL-XL) 50 MG 24 hr tablet Take 50 mg by mouth daily. 05/10/11   Lewayne Bunting, MD  Naftifine HCl (NAFTIN) 2 % CREA Apply 1 application topically daily. Apply to feet.    Historical Provider, MD  nitroGLYCERIN (NITROSTAT) 0.4 MG SL tablet Place 0.4 mg under the tongue every 5 (five) minutes as needed for chest pain.     Historical Provider, MD  oxyCODONE-acetaminophen (PERCOCET) 5-325 MG per tablet Take 1-2 tablets by mouth every 4 (four) hours as needed for severe pain. Patient taking differently: Take 1 tablet by mouth daily as needed for severe pain.  12/23/14   Shanker Levora Dredge, MD  polyethylene glycol (MIRALAX / Ethelene Hal) packet Take 17 g by mouth daily as needed (constipation).    Historical Provider, MD  ramelteon (ROZEREM) 8 MG tablet Take 8 mg by mouth at bedtime as needed for sleep.    Historical Provider, MD  rivaroxaban (XARELTO) 20 MG TABS tablet Take 1 tablet (20 mg total) by mouth daily with supper. Patient taking differently: Take 20 mg by mouth daily with breakfast.  06/26/14   Simonne Martinet, NP  senna (SENOKOT) 8.6 MG TABS tablet Take 1 tablet (8.6 mg total) by mouth 2 (two) times daily. Patient not taking: Reported on 05/09/2015 12/23/14    Maretta Bees, MD   Allergies  Allergen Reactions  . Iodine Anaphylaxis and Other (See Comments)    Associated with the shellfish allergy  . Shellfish Allergy Anaphylaxis    FAMILY HISTORY:  indicated that his mother is deceased. He indicated that his father is deceased.    SOCIAL HISTORY:  reports that he has never smoked. He does not have any smokeless tobacco history on file. He reports that he drinks about 0.6 oz of alcohol per week. He reports that he does not use illicit drugs.  REVIEW OF SYSTEMS:  Unable to complete as patient is altered on bipap  SUBJECTIVE:  Less responsive, hypoxic, on NIMV  VITAL SIGNS: Pulse Rate:  [109-130] 109 (10/18 1654) Resp:  [0-40] 0 (10/18 1654) BP: (86-177)/(42-109) 104/87 mmHg (10/18 1654) SpO2:  [77 %-98 %] 98 % (  10/18 1654) FiO2 (%):  [100 %] 100 % (10/18 1605) Weight:  [645 lb (292.57 kg)] 645 lb (292.57 kg) (10/18 1301)   HEMODYNAMICS:     VENTILATOR SETTINGS: Vent Mode:  [-] PRVC FiO2 (%):  [100 %] 100 % Set Rate:  [26 bmp] 26 bmp Vt Set:  [600 mL] 600 mL PEEP:  [5 cmH20] 5 cmH20 Plateau Pressure:  [31 cmH20] 31 cmH20   INTAKE / OUTPUT: No intake or output data in the 24 hours ending 07/20/15 1729  PHYSICAL EXAMINATION: General:  Morbidly obese male on bipap, somnolent  Neuro:  Minimal arousal to voice, opens eyes and drifts back to sleep HEENT:  MM pink/moist, unable to assess JVD Cardiovascular:  s1s2 rrr, tachy, distant tones  Lungs:  Even/non-labored, shallow, diminished breath sounds  Abdomen:  Obese/soft, bsx4 active  Musculoskeletal:  No acute deformities  Skin:  Warm/dry, generalized edema  LABS:  CBC  Recent Labs Lab 07/20/15 1404 07/20/15 1416  WBC 14.6*  --   HGB 14.7 18.0*  HCT 45.8 53.0*  PLT 307  --    Coag's  Recent Labs Lab 07/20/15 1404  INR 1.32   BMET  Recent Labs Lab 07/20/15 1404 07/20/15 1416  NA 136 137  K 5.1 5.1  CL 96* 95*  CO2 28  --   BUN 23* 25*  CREATININE  1.45* 1.30*  GLUCOSE 673* 692*   Electrolytes  Recent Labs Lab 07/20/15 1404  CALCIUM 8.8*   Sepsis Markers No results for input(s): LATICACIDVEN, PROCALCITON, O2SATVEN in the last 168 hours.   ABG  Recent Labs Lab 07/20/15 1410 07/20/15 1705  PHART 7.225* 7.399  PCO2ART 78.5* 39.6  PO2ART 74.9* 107*   Liver Enzymes  Recent Labs Lab 07/20/15 1404  AST 33  ALT 22  ALKPHOS 96  BILITOT 1.6*  ALBUMIN 3.9   Cardiac Enzymes  Recent Labs Lab 07/20/15 1404  TROPONINI 0.04*   Glucose No results for input(s): GLUCAP in the last 168 hours.  Imaging Dg Chest Portable 1 View  07/20/2015  CLINICAL DATA:  Respiratory distress. EXAM: PORTABLE CHEST 1 VIEW COMPARISON:  Same day. FINDINGS: Stable cardiomegaly. Evaluation of lung bases is limited due to body habitus. Stable bilateral lung opacities are noted as described on prior exam. Right internal jugular catheter is noted with distal tip in the expected position of the right innominate vein. No pneumothorax is noted. Endotracheal tube is seen projected over the tracheal air shadow with distal tip 8 cm above the carina. IMPRESSION: Limited examination due to body habitus. Stable right lung opacities with right greater than left. Endotracheal tube is in grossly good position. Right internal jugular catheter is noted with distal tip in expected position of right innominate vein. Electronically Signed   By: Lupita RaiderJames  Green Jr, M.D.   On: 07/20/2015 16:41   Dg Chest Port 1 View  07/20/2015  CLINICAL DATA:  Shortness of breath. EXAM: PORTABLE CHEST 1 VIEW COMPARISON:  May 19, 2015. FINDINGS: Stable cardiomegaly. No pneumothorax is noted. Central pulmonary vascular congestion is noted. Increased bilateral lung opacities are noted concerning for edema or possibly pneumonia, with right greater than left. Lung bases are not well visualized due to body habitus. Visualized portion of bony thorax is intact. IMPRESSION: Central pulmonary  vascular congestion is noted with bilateral lung opacities concerning for edema or pneumonia. Images are limited due to body habitus. Electronically Signed   By: Lupita RaiderJames  Green Jr, M.D.   On: 07/20/2015 14:02  ASSESSMENT / PLAN:  PULMONARY OETT 10/18 >>  A: Acute on Chronic Hypercarbic Respiratory Failure Obesity Hypoventilation Syndrome / Obstructive Sleep Apnea  R>L Airspace disease  P:   MV support, Vt 600, R 26, PEEP 5, FiO2 to support sats > 92% Trend CXR  Adjust ETT (1 cm advancement) PRN albuterol  Lasix TID  Daily SBT / WUA  R/o PNA, see ID   CARDIOVASCULAR CVL R IJ 10/18 >>  A:  Hypotension - secondary to sedation, resolving  Hx HTN, AF/A-Flutter, CHF (05/10/15 ECHO >> EF 55-60%, difficult study due to habitus) P:  ICU monitoring  NS @ KVO  RENAL A:   AKI  P:   Trend BMP / UOP  Place foley for strict I/O's  NS @ 50 ml/hr  GASTROINTESTINAL A:   Morbid Obesity  P:   Nutrition consult  NPO PPI  Insert OGT   HEMATOLOGIC A:   Mild Leukocytosis  P:  Trend CBC DVT prophylaxis:  Heparin SQ  INFECTIOUS A:   R>L Opacities on CXR P:   BCx2 10/18 >>  UC 10/18 >>  Sputum 10/18 >>   Vanco, start date 10/18, day 1/x Zosyn, start date 10/18, day 1/x  Assess PCT  PRN tylenol for temp 101.5 or greater  ENDOCRINE A:   DM II / Hyperglycemia - negative ketones on UA P:   ICU hyperglycemia protocol  Assess A1c  NEUROLOGIC A:   Acute Encephalopathy - secondary to hypercarbic respiratory failure  P:   RASS goal: 0 Propofol for sedation  PRN fentanyl    FAMILY  - Updates: No family at bedside.    - Inter-disciplinary family meet or Palliative Care meeting due by:  10/25  Canary Brim, NP-C [facility] Pgr: [phone] or if no answer [phone] 07/20/2015, 5:29 PM   STAFF NOTE: I, Rory Percy, MD FACP have personally reviewed patient's available data, including medical history, events of note, physical examination and  test results as part of my evaluation. I have discussed with resident/NP and other care providers such as pharmacist, RN and RRT. In addition, I personally evaluated patient and elicited key findings of: appears as gross volume overload, aspiration vomitus,  In setting OSA / OHS, he is failing NIMV, will aspirate and worsen, required emergent intubation, cover vanc, zosyn for abx, send sputum, line placed emergent right after awake intubation, assess cvp, lasix to neg balance, may need [place] rt chest, neg balance goals, stat pcxr, stat abg assessment, likely to need peep recruitment if he responds, neg 2 liters goal, change prop to fent, int versed The patient is critically ill with multiple organ systems failure and requires high complexity decision making for assessment and support, frequent evaluation and titration of therapies, application of advanced monitoring technologies and extensive interpretation of multiple databases.   Critical Care Time devoted to patient care services described in this note is 40 Minutes. This time reflects time of care of this signee: Rory Percy, MD FACP. This critical care time does not reflect procedure time, or teaching time or supervisory time of PA/NP/Med student/Med Resident etc but could involve care discussion time. Rest per NP/medical resident whose note is outlined above and that I agree with   [name]. Tyson Alias, MD, FACP Pgr: [phone]  Pulmonary & Critical Care 07/20/2015 7:44 PM

## 2015-07-20 NOTE — ED Notes (Signed)
Decision was made to intubate.  Dr. Tyson [name] did an awake intubation with lidocaine spray in the back of the throat.  Glidescope was used.  Vocal cords visualized.  Pt began to gag and grab for the tube.  Lost IV access in the process.  2 mg of ativan given IM.  Central line placed at the bedside.  20 of etomidate given once central line was placed.

## 2015-07-20 NOTE — ED Notes (Signed)
Bed: [id number] Expected date: 07/20/15 Expected time:  Means of arrival:  Comments: Ems-SHOB

## 2015-07-20 NOTE — Patient Outreach (Signed)
[facility] Care Management  07/20/2015  [name] 12/29/1970 [id number]   This RNCM was successful in meeting patient for this initial home visit for an assessment for community care coordination. patient was lying in bed on his side, wrapped in a bedspread.  Patient did not make eye contact. Patient's house strong human waste, was cluttered with discarded fast food wrappers, soiled clothing and furniture in disrepair. Patient stated he has not felt good for the last 4 days. Stated he felt like his chest was restricted.    Call made to primary care physician's office for same day appointment.  Was able to get an appointment for today at pm with Ms. Williford, PAC for Dr. Paulino RilyWolters (in her absence.  Receptionist consulted with triage nurse who advised patient to go to the ED.  Patient agreed, called a friend for transportation to [facility] ([facility]).  Spoke to Acute Care Manager for [facility] in an effort to fast track patient.  Acute Case Manager was to speak to ED charge Nurse to request she call patient to see if case could be triaged.    Call made to [facility], [facility] Hospital Liaison, to advise her of patient's intent.  While this RNCM was giving report to [place], ambulance/[facility] went to patent's home .  This RNCM was told that 911 was activated by patient.    This RNCM and fire emergency agreed that Adult Protective Services should be notified because of patient's living conditions and his condition.    Call made again to update Raiford NobleAtika Hall and to advise her patient will be coming to ED via ambulance as soon as a bariatric stretcher arrives as patient's weight is 700 pounds or more by his admission.  [facility] to give report, had to leave message as not investigators were available to take report at this time.

## 2015-07-20 NOTE — Progress Notes (Addendum)
[facility] ED CM consulted by [facility] staff when they were attempting to provide him with a 07/20/15 2 pm appt with Mila PalmerSharon Wolters -[place]  1050 Cm called pt at [phone], no answer. CM left a voice message for pt to return a call to ED CM  ED CM spoke with ED charge RN about trying to get pt to this appt [facility]   [facility] - ED CM updated by ED charge RN on pt status O2 sat 77% Pt was found in stool CM updated [facility] staff on pt status and request an appointment change

## 2015-07-20 NOTE — ED Notes (Signed)
Unable to collect labs at this time they are working with patient.

## 2015-07-20 NOTE — Progress Notes (Signed)
ANTIBIOTIC CONSULT NOTE - INITIAL  Pharmacy Consult for vancomycin and zosyn Indication: pneumonia  Allergies  Allergen Reactions  . Iodine Anaphylaxis and Other (See Comments)    Associated with the shellfish allergy  . Shellfish Allergy Anaphylaxis    Patient Measurements: Weight: (!) 645 lb (292.57 kg)   Vital Signs: BP: 167/91 mmHg (10/18 1447) Pulse Rate: 125 (10/18 1447) Intake/Output from previous day:   Intake/Output from this shift:    Labs:  Recent Labs  07/20/15 1404 07/20/15 1416  WBC 14.6*  --   HGB 14.7 18.0*  PLT 307  --   CREATININE 1.45* 1.30*   Estimated Creatinine Clearance: 171.6 mL/min (by C-G formula based on Cr of 1.3). No results for input(s): VANCOTROUGH, VANCOPEAK, VANCORANDOM, GENTTROUGH, GENTPEAK, GENTRANDOM, TOBRATROUGH, TOBRAPEAK, TOBRARND, AMIKACINPEAK, AMIKACINTROU, AMIKACIN in the last 72 hours.   Microbiology: No results found for this or any previous visit (from the past 720 hour(s)).  Medical History: Past Medical History  Diagnosis Date  . CHF (congestive heart failure) (HCC)     EF55-60%  . HTN (hypertension)   . Ptosis   . Atrial flutter (HCC)   . Atrial fibrillation (HCC)   . Morbid obesity (HCC)   . DM2 (diabetes mellitus, type 2) (HCC)   . Depression   . Chronic low back pain   . Asthma   . Vision abnormalities   . Abscess     right thigh  . Sleep apnea   . Chronic respiratory failure (HCC)   . Obesity hypoventilation syndrome [facility](HCC)    Assessment: Patient's a 44 y.o obese M who presented to the ED on 10/18 with c/o SOB and cough. CXR showed bilateral opacities with concern for PNA or edema.  Patient's now intubated.  To start broad abx for r/o PNA.   Goal of Therapy:  Vancomycin trough level 15-20 mcg/ml  Plan:  - zosyn 3.375 gm IV q8h infuse over 4 hours - vancomycin 2500 mg IV x1, then 1250 mg IV q12h.  Will check steady state level - f/u renal function and cultures  Dakotah Orrego P 07/20/2015,4:49  PM

## 2015-07-21 ENCOUNTER — Inpatient Hospital Stay (HOSPITAL_COMMUNITY): Payer: Medicare Other

## 2015-07-21 LAB — BLOOD GAS, ARTERIAL
ACID-BASE EXCESS: 10.5 mmol/L — AB (ref 0.0–2.0)
Acid-Base Excess: 3.2 mmol/L — ABNORMAL HIGH (ref 0.0–2.0)
Acid-Base Excess: 10.6 mmol/L — ABNORMAL HIGH (ref 0.0–2.0)
BICARBONATE: 29.5 meq/L — AB (ref 20.0–24.0)
Bicarbonate: 34.6 mEq/L — ABNORMAL HIGH (ref 20.0–24.0)
Bicarbonate: 36.7 mEq/L — ABNORMAL HIGH (ref 20.0–24.0)
Drawn by: 39899
Drawn by: 11249
Drawn by: 257701
FIO2: 1
FIO2: 0.6
FIO2: 0.6
LHR: 26 {breaths}/min
LHR: 18 {breaths}/min
MECHVT: 600 mL
MECHVT: 600 mL
O2 SAT: 95.2 %
O2 SAT: 93 %
O2 Saturation: 90.5 %
PATIENT TEMPERATURE: 100
PATIENT TEMPERATURE: 98.6
PCO2 ART: 44.4 mmHg (ref 35.0–45.0)
PCO2 ART: 56.9 mmHg — AB (ref 35.0–45.0)
PEEP/CPAP: 5 cmH2O
PEEP/CPAP: 8 cmH2O
PEEP: 5 cmH2O
PO2 ART: 60.4 mmHg — AB (ref 80.0–100.0)
PO2 ART: 71.8 mmHg — AB (ref 80.0–100.0)
Patient temperature: 98.6
RATE: 26 resp/min
TCO2: 26.8 mmol/L (ref 0–100)
TCO2: 29.9 mmol/L (ref 0–100)
TCO2: 32.6 mmol/L (ref 0–100)
VT: 600 mL
pCO2 arterial: 55.5 mmHg — ABNORMAL HIGH (ref 35.0–45.0)
pH, Arterial: 7.346 — ABNORMAL LOW (ref 7.350–7.450)
pH, Arterial: 7.507 — ABNORMAL HIGH (ref 7.350–7.450)
pH, Arterial: 7.426 (ref 7.350–7.450)
pO2, Arterial: 85.3 mmHg (ref 80.0–100.0)

## 2015-07-21 LAB — CBC
HEMATOCRIT: 41.5 % (ref 39.0–52.0)
Hemoglobin: 13.2 g/dL (ref 13.0–17.0)
MCH: 27.3 pg (ref 26.0–34.0)
MCHC: 31.8 g/dL (ref 30.0–36.0)
MCV: 85.9 fL (ref 78.0–100.0)
PLATELETS: 313 10*3/uL (ref 150–400)
RBC: 4.83 MIL/uL (ref 4.22–5.81)
RDW: 17.6 % — AB (ref 11.5–15.5)
WBC: 14.3 10*3/uL — AB (ref 4.0–10.5)

## 2015-07-21 LAB — GLUCOSE, CAPILLARY
GLUCOSE-CAPILLARY: 116 mg/dL — AB (ref 65–99)
GLUCOSE-CAPILLARY: 121 mg/dL — AB (ref 65–99)
GLUCOSE-CAPILLARY: 128 mg/dL — AB (ref 65–99)
GLUCOSE-CAPILLARY: 131 mg/dL — AB (ref 65–99)
GLUCOSE-CAPILLARY: 132 mg/dL — AB (ref 65–99)
GLUCOSE-CAPILLARY: 142 mg/dL — AB (ref 65–99)
GLUCOSE-CAPILLARY: 162 mg/dL — AB (ref 65–99)
GLUCOSE-CAPILLARY: 174 mg/dL — AB (ref 65–99)
GLUCOSE-CAPILLARY: 334 mg/dL — AB (ref 65–99)
GLUCOSE-CAPILLARY: 368 mg/dL — AB (ref 65–99)
GLUCOSE-CAPILLARY: 400 mg/dL — AB (ref 65–99)
GLUCOSE-CAPILLARY: 403 mg/dL — AB (ref 65–99)
Glucose-Capillary: 323 mg/dL — ABNORMAL HIGH (ref 65–99)
Glucose-Capillary: 312 mg/dL — ABNORMAL HIGH (ref 65–99)
Glucose-Capillary: 140 mg/dL — ABNORMAL HIGH (ref 65–99)
Glucose-Capillary: 172 mg/dL — ABNORMAL HIGH (ref 65–99)
Glucose-Capillary: 184 mg/dL — ABNORMAL HIGH (ref 65–99)
Glucose-Capillary: 145 mg/dL — ABNORMAL HIGH (ref 65–99)
Glucose-Capillary: 140 mg/dL — ABNORMAL HIGH (ref 65–99)
Glucose-Capillary: 127 mg/dL — ABNORMAL HIGH (ref 65–99)
Glucose-Capillary: 129 mg/dL — ABNORMAL HIGH (ref 65–99)

## 2015-07-21 LAB — BASIC METABOLIC PANEL
ANION GAP: 7 (ref 5–15)
ANION GAP: 9 (ref 5–15)
Anion gap: 7 (ref 5–15)
Anion gap: 8 (ref 5–15)
BUN: 17 mg/dL (ref 6–20)
BUN: 16 mg/dL (ref 6–20)
BUN: 15 mg/dL (ref 6–20)
BUN: 15 mg/dL (ref 6–20)
CALCIUM: 9 mg/dL (ref 8.9–10.3)
CHLORIDE: 99 mmol/L — AB (ref 101–111)
CHLORIDE: 98 mmol/L — AB (ref 101–111)
CO2: 36 mmol/L — AB (ref 22–32)
CO2: 37 mmol/L — ABNORMAL HIGH (ref 22–32)
CO2: 37 mmol/L — ABNORMAL HIGH (ref 22–32)
CO2: 36 mmol/L — ABNORMAL HIGH (ref 22–32)
CREATININE: 1.15 mg/dL (ref 0.61–1.24)
CREATININE: 1.2 mg/dL (ref 0.61–1.24)
Calcium: 8.6 mg/dL — ABNORMAL LOW (ref 8.9–10.3)
Calcium: 8.2 mg/dL — ABNORMAL LOW (ref 8.9–10.3)
Calcium: 8 mg/dL — ABNORMAL LOW (ref 8.9–10.3)
Chloride: 100 mmol/L — ABNORMAL LOW (ref 101–111)
Chloride: 99 mmol/L — ABNORMAL LOW (ref 101–111)
Creatinine, Ser: 1.1 mg/dL (ref 0.61–1.24)
Creatinine, Ser: 1.21 mg/dL (ref 0.61–1.24)
GFR calc Af Amer: >60 mL/min (ref >60)
GFR calc Af Amer: >60 mL/min (ref >60)
GFR calc Af Amer: >60 mL/min (ref >60)
GFR calc non Af Amer: >60 mL/min (ref >60)
GLUCOSE: 149 mg/dL — AB (ref 65–99)
GLUCOSE: 139 mg/dL — AB (ref 65–99)
GLUCOSE: 142 mg/dL — AB (ref 65–99)
Glucose, Bld: 193 mg/dL — ABNORMAL HIGH (ref 65–99)
POTASSIUM: 3.4 mmol/L — AB (ref 3.5–5.1)
POTASSIUM: 3.6 mmol/L (ref 3.5–5.1)
POTASSIUM: 3.5 mmol/L (ref 3.5–5.1)
Potassium: 3.6 mmol/L (ref 3.5–5.1)
Sodium: 143 mmol/L (ref 135–145)
Sodium: 143 mmol/L (ref 135–145)
Sodium: 144 mmol/L (ref 135–145)
Sodium: 143 mmol/L (ref 135–145)

## 2015-07-21 LAB — PHOSPHORUS: Phosphorus: 1.8 mg/dL — ABNORMAL LOW (ref 2.5–4.6)

## 2015-07-21 LAB — PROCALCITONIN: Procalcitonin: 0.23 ng/mL

## 2015-07-21 LAB — MAGNESIUM: Magnesium: 1.8 mg/dL (ref 1.7–2.4)

## 2015-07-21 MED ORDER — DILTIAZEM HCL 60 MG PO TABS
60.0000 mg | ORAL_TABLET | Freq: Four times a day (QID) | ORAL | Status: DC
  Administered 2015-07-21 – 2015-07-26 (×19): 60 mg via ORAL
  Filled 2015-07-21 (×16): qty 1
  Filled 2015-07-21: qty 2
  Filled 2015-07-21 (×2): qty 1
  Filled 2015-07-21: qty 2
  Filled 2015-07-21: qty 1

## 2015-07-21 MED ORDER — ANTISEPTIC ORAL RINSE SOLUTION (CORINZ)
7.0000 mL | Freq: Four times a day (QID) | OROMUCOSAL | Status: DC
  Administered 2015-07-21 – 2015-07-22 (×6): 7 mL via OROMUCOSAL

## 2015-07-21 MED ORDER — POTASSIUM CHLORIDE 20 MEQ/15ML (10%) PO SOLN
40.0000 meq | Freq: Every day | ORAL | Status: DC
  Administered 2015-07-21 – 2015-07-24 (×4): 40 meq
  Filled 2015-07-21 (×4): qty 30

## 2015-07-21 MED ORDER — DILTIAZEM HCL 60 MG PO TABS: 60.0000 mg | ORAL_TABLET | Freq: Four times a day (QID) | ORAL | Status: DC

## 2015-07-21 MED ORDER — METOPROLOL SUCCINATE ER 25 MG PO TB24
50.0000 mg | ORAL_TABLET | Freq: Every day | ORAL | Status: DC
  Filled 2015-07-21: qty 2

## 2015-07-21 MED ORDER — DILTIAZEM HCL ER 240 MG PO CP24
240.0000 mg | ORAL_CAPSULE | Freq: Every day | ORAL | Status: DC
  Filled 2015-07-21: qty 1
  Filled 2015-07-21: qty 2
  Filled 2015-07-21: qty 1

## 2015-07-21 MED ORDER — CHLORHEXIDINE GLUCONATE 0.12% ORAL RINSE (MEDLINE KIT)
15.0000 mL | Freq: Two times a day (BID) | OROMUCOSAL | Status: DC
  Administered 2015-07-21 – 2015-07-23 (×5): 15 mL via OROMUCOSAL

## 2015-07-21 MED ORDER — RIVAROXABAN 20 MG PO TABS
20.0000 mg | ORAL_TABLET | Freq: Every day | ORAL | Status: DC
  Administered 2015-07-21 – 2015-07-26 (×6): 20 mg via ORAL
  Filled 2015-07-21 (×5): qty 1

## 2015-07-21 MED ORDER — VITAL HIGH PROTEIN PO LIQD
1000.0000 mL | ORAL | Status: DC
  Administered 2015-07-21 – 2015-07-22 (×3): 1000 mL
  Filled 2015-07-21 (×3): qty 1000

## 2015-07-21 MED ORDER — METOPROLOL TARTRATE 25 MG PO TABS
25.0000 mg | ORAL_TABLET | Freq: Two times a day (BID) | ORAL | Status: DC
  Administered 2015-07-21 – 2015-07-26 (×10): 25 mg via ORAL
  Filled 2015-07-21 (×10): qty 1

## 2015-07-21 NOTE — Progress Notes (Signed)
Initial Nutrition Assessment  DOCUMENTATION CODES:   Morbid obesity  INTERVENTION:   TF recommendations w/ current Propofol infusion:  60 ml Prostat QID + 30 ml Prostat daily (9 total)  Tube feeding regimen + Propofol infusion provides 2291 kcal (56% of needs), 135 grams of protein (61% of needs), and 0 ml of H2O.  With current Propofol rate, cannot meet protein needs at this time.  RD to continue to adjust as needed based in Propofol use.  NUTRITION DIAGNOSIS:   Inadequate oral intake related to inability to eat as evidenced by NPO status.  GOAL:   Provide needs based on ASPEN/SCCM guidelines  MONITOR:   Vent status, Labs, Weight trends, TF tolerance, Skin, I & O's  REASON FOR ASSESSMENT:   Consult Assessment of nutrition requirement/status (TF recommendations)  ASSESSMENT:   44 y/o M with PMH of morbid obesity, CHF, OSH/OSA [facility] with complaints of shortness of breath with chest pain. Failed bipap therapy and was intubated.   Pt with high Propofol rate providing most of calorie needs. Per RN, he will try to lower Propofol rate today. With Propofol rate, TF will not meet patient's protein needs.  Patient is currently intubated on ventilator support MV: 11.1 L/min Temp (24hrs), Avg:99.9 F (37.7 C), Min:99.3 F (37.4 C), Max:100.2 F (37.9 C)  Propofol: 52.7 ml/hr -> provides 1391 fat kcal  Labs reviewed: CBGs: 131-145 Low K  Diet Order:  Diet NPO time specified  Skin:  Reviewed, no issues  Last BM:  10/18  Height:   Ht Readings from Last 1 Encounters:  07/21/15 6\' 4"  (1.93 m)    Weight:   Wt Readings from Last 1 Encounters:  07/21/15 632 lb 11.2 oz (286.991 kg)    Ideal Body Weight:  91.8 kg  BMI:  Body mass index is 77.05 kg/(m^2).  Estimated Nutritional Needs:   Kcal:  2019-2295  Protein:  220-230g  Fluid:  2.5L/day  EDUCATION NEEDS:   No education needs identified at this time  Tilda FrancoLindsey Jozelynn Danielson,  MS, RD, LDN Pager: [phone] After Hours Pager: [phone]

## 2015-07-21 NOTE — Progress Notes (Signed)
PULMONARY / CRITICAL CARE MEDICINE   Name: [name] MRN: [id number] DOB: [birth year]    ADMISSION DATE:  07/20/2015 CONSULTATION DATE:  07/20/15   REFERRING MD :  EDP   CHIEF COMPLAINT:  Hypercarbic Respiratory Failure  INITIAL PRESENTATION: 44 y/o M with PMH of morbid obesity, CHF, OSH/OSA who presented to [facility] via EMS on 10/18 with complaints of shortness of breath with chest pain.  Failed bipap therapy and was intubated.    STUDIES:    SIGNIFICANT EVENTS: 10/18  Admit with SOB & chest pain.  Failed bipap therapy in ER and was intubated (awake per Dr. Tyson Alias)   SUBJECTIVE:  RN reports ST in 120's, peak pressures elevated on vent, intermittently agitated on  propofol   VITAL SIGNS: Temp:  [99.7 F (37.6 C)-100.2 F (37.9 C)] 100 F (37.8 C) (10/19 0800) Pulse Rate:  [58-134] 92 (10/19 0400) Resp:  [0-40] 28 (10/19 0800) BP: (86-177)/(30-127) 103/30 mmHg (10/19 0800) SpO2:  [77 %-100 %] 95 % (10/19 0400) FiO2 (%):  [60 %-100 %] 60 % (10/19 0741) Weight:  [632 lb 11.2 oz (286.991 kg)-645 lb (292.57 kg)] 632 lb 11.2 oz (286.991 kg) (10/19 0400)   HEMODYNAMICS: CVP:  [11 mmHg-27 mmHg] 17 mmHg   VENTILATOR SETTINGS: Vent Mode:  [-] PRVC FiO2 (%):  [60 %-100 %] 60 % Set Rate:  [26 bmp] 26 bmp Vt Set:  [600 mL] 600 mL PEEP:  [5 cmH20] 5 cmH20 Plateau Pressure:  [31 cmH20-34 cmH20] 31 cmH20   INTAKE / OUTPUT:  Intake/Output Summary (Last 24 hours) at 07/21/15 0900 Last data filed at 07/21/15 0741  Gross per 24 hour  Intake 1969.04 ml  Output   6925 ml  Net -4955.96 ml    PHYSICAL EXAMINATION: General:  Morbidly obese male on vent, sedated  Neuro:  Sedated on propofol  HEENT:  MM pink/moist, unable to assess JVD Cardiovascular:  s1s2 rrr, tachy, distant tones  Lungs:  Even/non-labored, shallow, distant breath sounds  Abdomen:  Obese/soft, bsx4 active  Musculoskeletal:  No acute deformities  Skin:  Warm/dry, generalized edema  LABS:  CBC  Recent  Labs Lab 07/20/15 1404 07/20/15 1416 07/20/15 2230 07/21/15 0420  WBC 14.6*  --  14.3* 14.3*  HGB 14.7 18.0* 13.1 13.2  HCT 45.8 53.0* 40.5 41.5  PLT 307  --  315 313   Coag's  Recent Labs Lab 07/20/15 1404  INR 1.32   BMET  Recent Labs Lab 07/20/15 1404 07/20/15 1416 07/20/15 2230 07/21/15 0420  NA 136 137 142 143  K 5.1 5.1 4.4 3.6  CL 96* 95* 102 100*  CO2 28  --  34* 36*  BUN 23* 25* 18 17  CREATININE 1.45* 1.30* 1.21 1.15  GLUCOSE 673* 692* 344* 149*   Electrolytes  Recent Labs Lab 07/20/15 1404 07/20/15 2230 07/21/15 0420  CALCIUM 8.8* 8.8* 9.0  MG  --  1.9 1.8  PHOS  --  2.5 1.8*   Sepsis Markers  Recent Labs Lab 07/20/15 2230 07/21/15 0420  PROCALCITON 0.29 0.23     ABG  Recent Labs Lab 07/20/15 1410 07/20/15 1705 07/21/15 0440  PHART 7.225* 7.346* 7.507*  PCO2ART 78.5* 55.5* 44.4  PO2ART 74.9* 85.3 60.4*   Liver Enzymes  Recent Labs Lab 07/20/15 1404  AST 33  ALT 22  ALKPHOS 96  BILITOT 1.6*  ALBUMIN 3.9   Cardiac Enzymes  Recent Labs Lab 07/20/15 1404  TROPONINI 0.04*   Glucose  Recent Labs Lab 07/20/15 2300 07/21/15  0005 07/21/15 0110 07/21/15 0215 07/21/15 0318 07/21/15 0740  GLUCAP 400* 403* 334* 323* 312* 128*    Imaging Dg Chest Port 1 View  07/21/2015  CLINICAL DATA:  Acute onset of respiratory failure. Initial encounter. EXAM: PORTABLE CHEST 1 VIEW COMPARISON:  Chest radiograph performed 07/20/2015 FINDINGS: The patient's endotracheal tube is seen ending 3-4 cm above the carina. A right IJ line is noted ending about the mid SVC. An enteric tube is noted extending below the diaphragm. A small to moderate right-sided pleural effusion is noted. Diffuse right-sided airspace opacification and mild left basilar airspace opacity may reflect asymmetric pulmonary edema or pneumonia. ARDS cannot be excluded. No pneumothorax is seen. The cardiomediastinal silhouette is borderline normal in size. No acute osseous  abnormalities are seen. IMPRESSION: 1. Endotracheal tube seen ending 3-4 cm above the carina. 2. Small to moderate right-sided pleural effusion noted. Right-sided airspace opacification and mild left basilar airspace opacity may reflect asymmetric pulmonary edema or pneumonia. ARDS cannot be excluded. Electronically Signed   By: Roanna Raider M.D.   On: 07/21/2015 05:44   Dg Chest Portable 1 View  07/20/2015  CLINICAL DATA:  Respiratory distress. EXAM: PORTABLE CHEST 1 VIEW COMPARISON:  Same day. FINDINGS: Stable cardiomegaly. Evaluation of lung bases is limited due to body habitus. Stable bilateral lung opacities are noted as described on prior exam. Right internal jugular catheter is noted with distal tip in the expected position of the right innominate vein. No pneumothorax is noted. Endotracheal tube is seen projected over the tracheal air shadow with distal tip 8 cm above the carina. IMPRESSION: Limited examination due to body habitus. Stable right lung opacities with right greater than left. Endotracheal tube is in grossly good position. Right internal jugular catheter is noted with distal tip in expected position of right innominate vein. Electronically Signed   By: Lupita Raider, M.D.   On: 07/20/2015 16:41   Dg Chest Port 1 View  07/20/2015  CLINICAL DATA:  Shortness of breath. EXAM: PORTABLE CHEST 1 VIEW COMPARISON:  May 19, 2015. FINDINGS: Stable cardiomegaly. No pneumothorax is noted. Central pulmonary vascular congestion is noted. Increased bilateral lung opacities are noted concerning for edema or possibly pneumonia, with right greater than left. Lung bases are not well visualized due to body habitus. Visualized portion of bony thorax is intact. IMPRESSION: Central pulmonary vascular congestion is noted with bilateral lung opacities concerning for edema or pneumonia. Images are limited due to body habitus. Electronically Signed   By: Lupita Raider, M.D.   On: 07/20/2015 14:02      ASSESSMENT / PLAN:  PULMONARY OETT 10/18 >>  A: Acute on Chronic Hypercarbic Respiratory Failure Obesity Hypoventilation Syndrome / Obstructive Sleep Apnea  R>L Airspace disease  P:   MV support, Vt 600, R 18, PEEP 8, FiO2 to support sats > 92% Reduce rate, increase peep as above Assess follow up ABG Trend CXR  PRN albuterol  Lasix TID  Daily SBT / WUA  R/o PNA, see ID   CARDIOVASCULAR CVL R IJ 10/18 >>  A:  Hypotension - secondary to sedation, resolving  Hx HTN, AF/A-Flutter, CHF (05/10/15 ECHO >> EF 55-60%, difficult study due to habitus) P:  ICU monitoring  Continue xarelto Resume diltiazem, toprolol  Assess EKG   RENAL A:   AKI  At Risk Hypokalemia  P:   Trend BMP / UOP  Place foley for strict I/O's  NS @ 50 ml/hr with insulin protocol  KCL 40 mEq QD  GASTROINTESTINAL A:   Morbid Obesity  P:   Nutrition consult  Consider TF in am 10/20 NPO Ranitidine for SUP Insert OGT   HEMATOLOGIC A:   Mild Leukocytosis  P:  Trend CBC DVT prophylaxis:  Heparin SQ  INFECTIOUS A:   R>L Opacities on CXR P:   BCx2 10/18 >>  UA 10/18 >> neg UC 10/18 >>  Sputum 10/18 >>   Vanco, start date 10/18, day 2/x Zosyn, start date 10/18, day 2/x  Trend PCT  PRN tylenol for temp 101.5 or greater  ENDOCRINE A:   DM II / Hyperglycemia - negative ketones on UA P:   ICU hyperglycemia protocol  Assess A1c > 8.9  NEUROLOGIC A:   Acute Encephalopathy - secondary to hypercarbic respiratory failure  P:   RASS goal: -1 Propofol for sedation  PRN fentanyl    FAMILY  - Updates: No family at bedside.    - Inter-disciplinary family meet or Palliative Care meeting due by:  10/25   Canary BrimBrandi Shellia Hartl, NP-C [facility] Pgr: [phone] or if no answer [phone] 07/21/2015, 9:00 AM

## 2015-07-22 ENCOUNTER — Inpatient Hospital Stay (HOSPITAL_COMMUNITY): Payer: Medicare Other

## 2015-07-22 DIAGNOSIS — J810 Acute pulmonary edema: Secondary | ICD-10-CM

## 2015-07-22 LAB — BASIC METABOLIC PANEL
Anion gap: 11 (ref 5–15)
BUN: 16 mg/dL (ref 6–20)
CHLORIDE: 94 mmol/L — AB (ref 101–111)
CO2: 38 mmol/L — AB (ref 22–32)
CREATININE: 1.26 mg/dL — AB (ref 0.61–1.24)
Calcium: 8.3 mg/dL — ABNORMAL LOW (ref 8.9–10.3)
GFR calc Af Amer: >60 mL/min (ref >60)
GFR calc non Af Amer: >60 mL/min (ref >60)
GLUCOSE: 217 mg/dL — AB (ref 65–99)
POTASSIUM: 3.7 mmol/L (ref 3.5–5.1)
SODIUM: 143 mmol/L (ref 135–145)

## 2015-07-22 LAB — GLUCOSE, CAPILLARY
GLUCOSE-CAPILLARY: 216 mg/dL — AB (ref 65–99)
GLUCOSE-CAPILLARY: 278 mg/dL — AB (ref 65–99)
GLUCOSE-CAPILLARY: 240 mg/dL — AB (ref 65–99)
GLUCOSE-CAPILLARY: 135 mg/dL — AB (ref 65–99)
GLUCOSE-CAPILLARY: 154 mg/dL — AB (ref 65–99)
GLUCOSE-CAPILLARY: 201 mg/dL — AB (ref 65–99)
Glucose-Capillary: 223 mg/dL — ABNORMAL HIGH (ref 65–99)
Glucose-Capillary: 260 mg/dL — ABNORMAL HIGH (ref 65–99)
Glucose-Capillary: 262 mg/dL — ABNORMAL HIGH (ref 65–99)
Glucose-Capillary: 171 mg/dL — ABNORMAL HIGH (ref 65–99)
Glucose-Capillary: 192 mg/dL — ABNORMAL HIGH (ref 65–99)
Glucose-Capillary: 205 mg/dL — ABNORMAL HIGH (ref 65–99)
Glucose-Capillary: 146 mg/dL — ABNORMAL HIGH (ref 65–99)
Glucose-Capillary: 161 mg/dL — ABNORMAL HIGH (ref 65–99)
Glucose-Capillary: 206 mg/dL — ABNORMAL HIGH (ref 65–99)

## 2015-07-22 LAB — CBC
HEMATOCRIT: 40.5 % (ref 39.0–52.0)
HEMOGLOBIN: 12.9 g/dL — AB (ref 13.0–17.0)
MCH: 27.7 pg (ref 26.0–34.0)
MCHC: 31.9 g/dL (ref 30.0–36.0)
MCV: 86.9 fL (ref 78.0–100.0)
Platelets: 278 10*3/uL (ref 150–400)
RBC: 4.66 MIL/uL (ref 4.22–5.81)
RDW: 18 % — AB (ref 11.5–15.5)
WBC: 15.4 10*3/uL — ABNORMAL HIGH (ref 4.0–10.5)

## 2015-07-22 LAB — URINE CULTURE: CULTURE: NO GROWTH

## 2015-07-22 LAB — HEMOGLOBIN A1C
Hgb A1c MFr Bld: 10.8 % — ABNORMAL HIGH (ref 4.8–5.6)
MEAN PLASMA GLUCOSE: 263 mg/dL

## 2015-07-22 LAB — PHOSPHORUS: Phosphorus: 3.7 mg/dL (ref 2.5–4.6)

## 2015-07-22 LAB — PROCALCITONIN: Procalcitonin: 0.17 ng/mL

## 2015-07-22 LAB — MAGNESIUM: Magnesium: 1.6 mg/dL — ABNORMAL LOW (ref 1.7–2.4)

## 2015-07-22 MED ORDER — INSULIN ASPART 100 UNIT/ML ~~LOC~~ SOLN
0.0000 [IU] | SUBCUTANEOUS | Status: DC
  Administered 2015-07-22: 7 [IU] via SUBCUTANEOUS
  Administered 2015-07-22: 4 [IU] via SUBCUTANEOUS
  Administered 2015-07-22 – 2015-07-23 (×3): 7 [IU] via SUBCUTANEOUS
  Administered 2015-07-23: 15 [IU] via SUBCUTANEOUS
  Administered 2015-07-23: 7 [IU] via SUBCUTANEOUS
  Administered 2015-07-23: 4 [IU] via SUBCUTANEOUS
  Administered 2015-07-23: 7 [IU] via SUBCUTANEOUS
  Administered 2015-07-24: 11 [IU] via SUBCUTANEOUS
  Administered 2015-07-24 (×2): 4 [IU] via SUBCUTANEOUS

## 2015-07-22 MED ORDER — MAGNESIUM SULFATE 2 GM/50ML IV SOLN
2.0000 g | Freq: Once | INTRAVENOUS | Status: AC
Start: 2015-07-22 — End: 2015-07-22
  Administered 2015-07-22: 2 g via INTRAVENOUS
  Filled 2015-07-22: qty 50

## 2015-07-22 MED ORDER — INSULIN GLARGINE 100 UNIT/ML ~~LOC~~ SOLN
25.0000 [IU] | Freq: Two times a day (BID) | SUBCUTANEOUS | Status: DC
  Administered 2015-07-22: 20 [IU] via SUBCUTANEOUS
  Administered 2015-07-22 – 2015-07-23 (×2): 25 [IU] via SUBCUTANEOUS
  Filled 2015-07-22 (×4): qty 0.25

## 2015-07-22 MED ORDER — SUCCINYLCHOLINE CHLORIDE 20 MG/ML IJ SOLN
INTRAMUSCULAR | Status: AC
  Filled 2015-07-22: qty 1

## 2015-07-22 MED ORDER — MIDAZOLAM HCL 2 MG/2ML IJ SOLN
INTRAMUSCULAR | Status: DC
Start: 2015-07-22 — End: 2015-07-22
  Filled 2015-07-22: qty 4

## 2015-07-22 MED ORDER — PHENOL 1.4 % MT LIQD
1.0000 | OROMUCOSAL | Status: DC | PRN
  Filled 2015-07-22: qty 177

## 2015-07-22 MED ORDER — NYSTATIN 100000 UNIT/ML MT SUSP
5.0000 mL | Freq: Four times a day (QID) | OROMUCOSAL | Status: DC
  Administered 2015-07-22 – 2015-07-24 (×11): 500000 [IU] via ORAL
  Filled 2015-07-22 (×11): qty 5

## 2015-07-22 MED ORDER — INSULIN GLARGINE 100 UNIT/ML ~~LOC~~ SOLN
5.0000 [IU] | Freq: Every day | SUBCUTANEOUS | Status: DC
  Administered 2015-07-22: 5 [IU] via SUBCUTANEOUS
  Filled 2015-07-22: qty 0.05

## 2015-07-22 MED ORDER — LIDOCAINE HCL (CARDIAC) 20 MG/ML IV SOLN
INTRAVENOUS | Status: AC
  Filled 2015-07-22: qty 5

## 2015-07-22 MED ORDER — PRO-STAT SUGAR FREE PO LIQD
60.0000 mL | Freq: Every day | ORAL | Status: DC
Start: 2015-07-22 — End: 2015-07-23
  Administered 2015-07-23: 60 mL
  Filled 2015-07-22: qty 60

## 2015-07-22 MED ORDER — INSULIN GLARGINE 100 UNIT/ML ~~LOC~~ SOLN: 25.0000 [IU] | Freq: Two times a day (BID) | SUBCUTANEOUS | Status: DC

## 2015-07-22 MED ORDER — ETOMIDATE 2 MG/ML IV SOLN
INTRAVENOUS | Status: AC
  Filled 2015-07-22: qty 20

## 2015-07-22 MED ORDER — METOPROLOL TARTRATE 1 MG/ML IV SOLN: 2.5000 mg | INTRAVENOUS | Status: DC | PRN

## 2015-07-22 MED ORDER — ROCURONIUM BROMIDE 50 MG/5ML IV SOLN
INTRAVENOUS | Status: AC
  Filled 2015-07-22: qty 2

## 2015-07-22 MED ORDER — VITAL HIGH PROTEIN PO LIQD
1000.0000 mL | ORAL | Status: DC
  Filled 2015-07-22: qty 1000

## 2015-07-22 MED ORDER — FENTANYL CITRATE (PF) 100 MCG/2ML IJ SOLN
INTRAMUSCULAR | Status: AC
  Filled 2015-07-22: qty 4

## 2015-07-22 MED ORDER — INSULIN ASPART 100 UNIT/ML ~~LOC~~ SOLN: 4.0000 [IU] | SUBCUTANEOUS | Status: DC

## 2015-07-22 MED ORDER — FUROSEMIDE 10 MG/ML IJ SOLN
5.0000 mg/h | INTRAVENOUS | Status: DC
  Administered 2015-07-22: 8 mg/h via INTRAVENOUS
  Administered 2015-07-23: 5 mg/h via INTRAVENOUS
  Filled 2015-07-22: qty 25
  Filled 2015-07-22: qty 20
  Filled 2015-07-22: qty 25

## 2015-07-22 NOTE — Progress Notes (Signed)
Nutrition Follow-up  DOCUMENTATION CODES:   Morbid obesity  INTERVENTION:   Decrease Vital HP to 15 ml/hr. 60 ml Prostat 5 times daily.  Tube feeding regimen + current Propofol infusion provides 2289 kcal (55% of needs), 182 g protein (83% of needs) and 301 ml free water. With current Propofol rate, unable to meet all of protein needs.  RD to continue to monitor and adjust based on Propofol use.  NUTRITION DIAGNOSIS:   Inadequate oral intake related to inability to eat as evidenced by NPO status.  Ongoing.  GOAL:   Provide needs based on ASPEN/SCCM guidelines  Not meeting.  MONITOR:   Vent status, Labs, Weight trends, TF tolerance, Skin, I & O's  REASON FOR ASSESSMENT:   Consult Enteral/tube feeding initiation and management  ASSESSMENT:   44 y/o M with PMH of morbid obesity, CHF, OSH/OSA who [facility] with complaints of shortness of breath with chest pain. Failed bipap therapy and was intubated.   Tube feeding was started 10/19 on Vital HP. Vital HP now infusing @ 40 ml/hr, this is providing 960 kcal and 84g protein. RD adjusted TF order to better meet protein and calorie needs. Unable to meet all of protein needs d/t high Propofol rate.  Patient is currently intubated on ventilator support MV: 13.2 L/min Temp (24hrs), Avg:99.9 F (37.7 C), Min:99.3 F (37.4 C), Max:100.6 F (38.1 C)  Propofol: 35.2 ml/hr -> provides 929 fat kcal  Labs reviewed: CBGs: 135-192 Low Mg Phos WNL   Diet Order:  Diet NPO time specified  Skin:  Reviewed, no issues  Last BM:  10/18  Height:   Ht Readings from Last 1 Encounters:  07/21/15 6\' 4"  (1.93 m)    Weight:   Wt Readings from Last 1 Encounters:  07/22/15 607 lb (275.333 kg)    Ideal Body Weight:  91.8 kg  BMI:  Body mass index is 73.92 kg/(m^2).  Estimated Nutritional Needs:   Kcal:  2019-2295  Protein:  220-230g  Fluid:  2.5L/day  EDUCATION NEEDS:   No education needs  identified at this time  Tilda FrancoLindsey Cletis Muma, MS, RD, LDN Pager: [phone] After Hours Pager: [phone]

## 2015-07-22 NOTE — Progress Notes (Addendum)
CSW consulted to assist with possible SNF placement at d/c. Pt remains on BIPAP. CSW met with Ailene Ards from [facility] [employer]. Ms. Dema Severin provided a release of information form ( in chart ). Clinical info provided to Ms. White. CSW will remain in contact with APS worker.   Werner Lean LCSW [phone]

## 2015-07-22 NOTE — Progress Notes (Addendum)
Inpatient Diabetes Program Recommendations  AACE/ADA: New Consensus Statement on Inpatient Glycemic Control (2015)  Target Ranges:  Prepandial:   less than 140 mg/dL      Peak postprandial:   less than 180 mg/dL (1-2 hours)      Critically ill patients:  140 - 180 mg/dL    Admit with: Respiratory Failure/ Hyperglycemia  History: DM, CHF, OSA  Home DM Meds: Humalog 75/25 Mix insulin- 75 units Breakfast/ 40 units Lunch/ 75 units Dinner  Current Insulin Orders: IV Insulin Drip     -Note patient started on Vital HP continuous tube feedings yesterday round 4pm.    -Glucose levels started to rise later in the day likely due to the addition of the continuous tube feedings.  -Expect sugars to stabilize with the IV insulin drip as the Multiplier adjusts with the MattellucoStabilizer software.     MD- When patient is ready to transition off the IV Insulin drip, patient will need a large amount of insulin to keep his CBGs under control.    Do not recommend using patient's home 75/25 insulin while he is hospitalized.  Would use Levemir or Lantus along with Novolog.     --Will follow patient during hospitalization--  Ambrose FinlandJeannine Johnston Tashawn Greff RN, MSN, CDE Diabetes Coordinator Inpatient Glycemic Control Team Team Pager: [phone] (8a-5p)

## 2015-07-22 NOTE — Progress Notes (Signed)
S:  Called by ICU staff reporting patient self extubated during SBT/WUA   O:  Blood pressure 122/53, pulse 98, temperature 100.8 F (38.2 C), temperature source Core (Comment), resp. rate 20, height 6\' 4"  (1.93 m), weight 607 lb (275.333 kg), SpO2 93 %.  Patient sedate but opens eyes and follows commands.  Speech clear - reports sore throat.  MAE.  Even/non-labored respirations on NRB.    A:  Self-Extubation  Hypercarbic Respiratory Failure  P: Initiate BiPAP 18/10 with FiO2 to keep saturations 90-95%.  Once pt more alert, ok to allow for break D/c sedating medications  Chloraseptic spray  Nystatin swish & swallow (? Thrush vs secretions on prior intubation) Push pulmonary hygiene - IS, mobilize as able / upright positioning  NPO   Evan BrimBrandi Shamon Lobo, NP-C  Pulmonary & Critical Care Pgr: [phone] or if no answer [phone] 07/22/2015, 11:19 AM

## 2015-07-22 NOTE — Progress Notes (Signed)
Patient placed on BIPAP HS on 20/10 and 50%FIO2. Patient tolerating well sat 95%. Patient in no distress. RT will continue to monitor.

## 2015-07-22 NOTE — Progress Notes (Signed)
ABG D/C'd per verbal order Dr. Kendrick FriesMcQuaid.

## 2015-07-22 NOTE — Progress Notes (Signed)
RT called to patient bedside. Patient self-extubated and was ventilated via BVM and then placed on NRB. Dr. Kendrick FriesMcQuaid and Canary BrimBrandi Ollis, NP called to bedside to assess patient. Trial of BiPAP is started at 18/+10. Patient is tachypneic, but not labored and vitals remain stable.  Patient is following commands, but still sleepy. ABG scheduled for 30 min after SBT to be drawn 30 mins post initiation of BiPAP. RT will continue to monitor patient.

## 2015-07-22 NOTE — Patient Outreach (Signed)
[facility] Care Management  07/22/2015  Maurice Marchaul J Michetti 07/24/1971 [id number]   Adult Protective Services report taken by Rosalio MacadamiaBrandy Thomas, [facility].

## 2015-07-22 NOTE — Progress Notes (Signed)
PULMONARY / CRITICAL CARE MEDICINE   Name: [name] MRN: [id number] DOB: 01/30/1971    ADMISSION DATE:  07/20/2015 CONSULTATION DATE:  07/20/15   REFERRING MD :  EDP   CHIEF COMPLAINT:  Hypercarbic Respiratory Failure  INITIAL PRESENTATION: 44 y/o M with PMH of morbid obesity, CHF, OSH/OSA who [facility] with complaints of shortness of breath with chest pain.  Failed bipap therapy and was intubated.    STUDIES:    SIGNIFICANT EVENTS: 10/18  Admit with SOB & chest pain.  Failed bipap therapy in ER and was intubated (awake per Dr. Tyson [name]) 10/19  ST in 120's, elevated peak pressures on vent, intermittent agitation.  Vent settings later changed from PRVC to PCV   SUBJECTIVE:  RN reports no acute events - pt remains on insulin gtt, propofol 20, CVP 16. Tmax 100.8  VITAL SIGNS: Temp:  [99.3 F (37.4 C)-100.8 F (38.2 C)] 100.8 F (38.2 C) (10/20 1015) Pulse Rate:  [68-138] 98 (10/20 1015) Resp:  [13-43] 20 (10/20 1015) BP: (83-137)/(22-90) 122/53 mmHg (10/20 1000) SpO2:  [91 %-98 %] 93 % (10/20 1015) FiO2 (%):  [50 %-60 %] 50 % (10/20 0811) Weight:  [607 lb (275.333 kg)] 607 lb (275.333 kg) (10/20 0414)   HEMODYNAMICS: CVP:  [14 mmHg-17 mmHg] 16 mmHg   VENTILATOR SETTINGS: Vent Mode:  [-] PCV FiO2 (%):  [50 %-60 %] 50 % Set Rate:  [18 bmp] 18 bmp Vt Set:  [600 mL] 600 mL PEEP:  [8 cmH20] 8 cmH20 Plateau Pressure:  [24 cmH20-30 cmH20] 30 cmH20   INTAKE / OUTPUT:  Intake/Output Summary (Last 24 hours) at 07/22/15 1034 Last data filed at 07/22/15 1000  Gross per 24 hour  Intake 2817.06 ml  Output   7060 ml  Net -4242.94 ml    PHYSICAL EXAMINATION: General:  Morbidly obese male on vent, sedated  Neuro:  Sedated on propofol, raises eyebrows when name called  HEENT:  MM pink/moist, unable to assess JVD Cardiovascular:  s1s2 rrr, tachy, distant tones  Lungs:  Even/non-labored, shallow, distant breath sounds  Abdomen:  Obese/soft, bsx4  active  Musculoskeletal:  No acute deformities  Skin:  Warm/dry, generalized edema  LABS:  CBC  Recent Labs Lab 07/20/15 2230 07/21/15 0420 07/22/15 0430  WBC 14.3* 14.3* 15.4*  HGB 13.1 13.2 12.9*  HCT 40.5 41.5 40.5  PLT 315 313 278   Coag's  Recent Labs Lab 07/20/15 1404  INR 1.32   BMET  Recent Labs Lab 07/21/15 0943 07/21/15 1420 07/21/15 1826  NA 143 144 143  K 3.4* 3.6 3.5  CL 99* 99* 98*  CO2 37* 37* 36*  BUN 16 15 15   CREATININE 1.10 1.20 1.21  GLUCOSE 139* 142* 193*   Electrolytes  Recent Labs Lab 07/20/15 2230 07/21/15 0420 07/21/15 0943 07/21/15 1420 07/21/15 1826 07/22/15 0430  CALCIUM 8.8* 9.0 8.6* 8.2* 8.0*  --   MG 1.9 1.8  --   --   --  1.6*  PHOS 2.5 1.8*  --   --   --  3.7   Sepsis Markers  Recent Labs Lab 07/20/15 2230 07/21/15 0420 07/22/15 0430  PROCALCITON 0.29 0.23 0.17     ABG  Recent Labs Lab 07/20/15 1705 07/21/15 0440 07/21/15 1100  PHART 7.346* 7.507* 7.426  PCO2ART 55.5* 44.4 56.9*  PO2ART 85.3 60.4* 71.8*   Liver Enzymes  Recent Labs Lab 07/20/15 1404  AST 33  ALT 22  ALKPHOS 96  BILITOT  1.6*  ALBUMIN 3.9   Cardiac Enzymes  Recent Labs Lab 07/20/15 1404  TROPONINI 0.04*   Glucose  Recent Labs Lab 07/22/15 0251 07/22/15 0356 07/22/15 0506 07/22/15 0620 07/22/15 0729 07/22/15 0843  GLUCAP 278* 240* 205* 192* 171* 135*    Imaging Dg Chest Port 1 View  07/22/2015  CLINICAL DATA:  Respiratory failure.  Intubation. EXAM: PORTABLE CHEST 1 VIEW COMPARISON:  07/21/2015. FINDINGS: Limited view of the chest obtained. Mid joint right hemi thorax not imaged. Low lung volumes. Persistent diffuse infiltrate right lung. Persistent left lower lobe infiltrate and/or atelectasis. No pneumothorax visualized . IMPRESSION: 1.  Limited view of the chest.  Lines and tubes in stable position. 2. Low lung volumes. Persistent diffuse right lung and left lower lobe infiltrate. No interim change. 3.   Persistent cardiomegaly. Electronically Signed   By: Maisie Fus  Register   On: 07/22/2015 07:31     ASSESSMENT / PLAN:  PULMONARY OETT 10/18 >>  A: Acute on Chronic Hypercarbic Respiratory Failure Obesity Hypoventilation Syndrome / Obstructive Sleep Apnea  R>L Airspace disease - ? Effusions vs airspace diease.  PCT low.   P:   MV support, PCV - PC 23 / PEEP 8 Wean PEEP / FiO2 for sats > 90% Trial SBT with follow up ABG 30 minutes post.  Hopeful for extubation 10/20 Trend CXR  PRN albuterol  Change to lasix gtt 10/20  Daily SBT / WUA  R/o PNA, see ID   CARDIOVASCULAR CVL R IJ 10/18 >>  A:  Hypotension - secondary to sedation, resolving  Hx HTN, AF/A-Flutter, CHF (05/10/15 ECHO >> EF 55-60%, difficult study due to habitus) P:  ICU monitoring  Continue xarelto Continue diltiazem, toprolol   RENAL A:   AKI  At Risk Hypokalemia with Diuresis   Hypomagnesemia P:   Trend BMP / UOP  Place foley for strict I/O's  NS @ 50 ml/hr with insulin protocol  KCL 40 mEq QD  Replace electrolytes as indicated, Mg+ 10/20  GASTROINTESTINAL A:   Morbid Obesity  P:   Nutrition consult  TF per nutrition  NPO Ranitidine for SUP OGT   HEMATOLOGIC A:   Mild Leukocytosis  P:  Trend CBC DVT prophylaxis:  Heparin SQ  INFECTIOUS A:   R>L Opacities on CXR - ? Effusion vs airspace disease, PCT low P:   BCx2 10/18 >>  UA 10/18 >> neg UC 10/18 >> neg Sputum 10/18 >>   Vanco, start date 10/18, day 3/x Zosyn, start date 10/18, day 3/x  Trend PCT  PRN tylenol for temp 101.5 or greater Consider d/c abx in am 10/21  ENDOCRINE A:   DM II / Hyperglycemia - negative ketones on UA.  A1c > 8.9 P:   Transition off insulin gtt Add basal insulin @ 0.2 units per kg, conservative 50 units total QD   NEUROLOGIC A:   Acute Encephalopathy - secondary to hypercarbic respiratory failure  P:   RASS goal: -1 Propofol for sedation  PRN fentanyl  Daily WUA / SBT    FAMILY  - Updates: No  family at bedside.    - Inter-disciplinary family meet or Palliative Care meeting due by:  10/25   Canary Brim, NP-C [place] [facility] Pgr: [phone] or if no answer [phone] 07/22/2015, 10:34 AM

## 2015-07-22 NOTE — Progress Notes (Signed)
Date: July 22, 2015 Chart reviewed for concurrent status and case management needs. Will continue to follow patient for changes and needs: Remains on vent support weaning not started. Marcelle Smilinghonda Davis, RN, BSN, [place]   [phone]

## 2015-07-23 ENCOUNTER — Inpatient Hospital Stay (HOSPITAL_COMMUNITY): Payer: Medicare Other

## 2015-07-23 LAB — CBC
HEMATOCRIT: 42.8 % (ref 39.0–52.0)
HEMOGLOBIN: 13.4 g/dL (ref 13.0–17.0)
MCH: 27.3 pg (ref 26.0–34.0)
MCHC: 31.3 g/dL (ref 30.0–36.0)
MCV: 87.3 fL (ref 78.0–100.0)
Platelets: 280 10*3/uL (ref 150–400)
RBC: 4.9 MIL/uL (ref 4.22–5.81)
RDW: 17.6 % — ABNORMAL HIGH (ref 11.5–15.5)
WBC: 13.6 10*3/uL — ABNORMAL HIGH (ref 4.0–10.5)

## 2015-07-23 LAB — BASIC METABOLIC PANEL
ANION GAP: 8 (ref 5–15)
BUN: 15 mg/dL (ref 6–20)
CHLORIDE: 94 mmol/L — AB (ref 101–111)
CO2: 40 mmol/L — ABNORMAL HIGH (ref 22–32)
Calcium: 8.2 mg/dL — ABNORMAL LOW (ref 8.9–10.3)
Creatinine, Ser: 1.2 mg/dL (ref 0.61–1.24)
Glucose, Bld: 246 mg/dL — ABNORMAL HIGH (ref 65–99)
POTASSIUM: 3.8 mmol/L (ref 3.5–5.1)
SODIUM: 142 mmol/L (ref 135–145)

## 2015-07-23 LAB — BLOOD GAS, ARTERIAL
ACID-BASE EXCESS: 12.5 mmol/L — AB (ref 0.0–2.0)
BICARBONATE: 39.3 meq/L — AB (ref 20.0–24.0)
Delivery systems: POSITIVE
Drawn by: 345601
EXPIRATORY PAP: 10
FIO2: 0.5
INSPIRATORY PAP: 18
LHR: 8 {breaths}/min
Mode: POSITIVE
O2 SAT: 95.7 %
PATIENT TEMPERATURE: 99.1
PCO2 ART: 60.7 mmHg — AB (ref 35.0–45.0)
PH ART: 7.429 (ref 7.350–7.450)
TCO2: 34.5 mmol/L (ref 0–100)
pO2, Arterial: 85.2 mmHg (ref 80.0–100.0)

## 2015-07-23 LAB — GLUCOSE, CAPILLARY
GLUCOSE-CAPILLARY: 212 mg/dL — AB (ref 65–99)
GLUCOSE-CAPILLARY: 229 mg/dL — AB (ref 65–99)
GLUCOSE-CAPILLARY: 250 mg/dL — AB (ref 65–99)
Glucose-Capillary: 191 mg/dL — ABNORMAL HIGH (ref 65–99)
Glucose-Capillary: 210 mg/dL — ABNORMAL HIGH (ref 65–99)
Glucose-Capillary: 302 mg/dL — ABNORMAL HIGH (ref 65–99)

## 2015-07-23 LAB — MAGNESIUM: Magnesium: 1.9 mg/dL (ref 1.7–2.4)

## 2015-07-23 MED ORDER — PRO-STAT SUGAR FREE PO LIQD
30.0000 mL | Freq: Three times a day (TID) | ORAL | Status: DC
  Administered 2015-07-23 – 2015-07-26 (×8): 30 mL via ORAL
  Filled 2015-07-23 (×13): qty 30

## 2015-07-23 MED ORDER — IBUPROFEN 200 MG PO TABS
400.0000 mg | ORAL_TABLET | Freq: Once | ORAL | Status: AC
  Administered 2015-07-23: 400 mg via ORAL
  Filled 2015-07-23: qty 2

## 2015-07-23 MED ORDER — BOOST / RESOURCE BREEZE PO LIQD
1.0000 | Freq: Three times a day (TID) | ORAL | Status: DC
  Administered 2015-07-23 – 2015-07-26 (×9): 1 via ORAL

## 2015-07-23 MED ORDER — CHLORHEXIDINE GLUCONATE 0.12 % MT SOLN
15.0000 mL | Freq: Two times a day (BID) | OROMUCOSAL | Status: DC
  Administered 2015-07-24 – 2015-07-25 (×2): 15 mL via OROMUCOSAL
  Filled 2015-07-23 (×7): qty 15

## 2015-07-23 MED ORDER — CETYLPYRIDINIUM CHLORIDE 0.05 % MT LIQD
7.0000 mL | Freq: Two times a day (BID) | OROMUCOSAL | Status: DC
  Administered 2015-07-25 – 2015-07-26 (×4): 7 mL via OROMUCOSAL

## 2015-07-23 MED ORDER — INSULIN GLARGINE 100 UNIT/ML ~~LOC~~ SOLN
30.0000 [IU] | Freq: Two times a day (BID) | SUBCUTANEOUS | Status: DC
  Administered 2015-07-23: 30 [IU] via SUBCUTANEOUS
  Filled 2015-07-23 (×2): qty 0.3

## 2015-07-23 MED ORDER — CETYLPYRIDINIUM CHLORIDE 0.05 % MT LIQD
7.0000 mL | Freq: Two times a day (BID) | OROMUCOSAL | Status: DC
  Administered 2015-07-23: 7 mL via OROMUCOSAL

## 2015-07-23 NOTE — Progress Notes (Signed)
CRITICAL VALUE ALERT  Critical value received:  CO2 60.7  Date of notification:  07/23/2015  Time of notification:  0410  Critical value read back:Yes.    Nurse who received alert:  Effie BerkshireAlex Issis Lindseth  MD notified (1st page):  [place]rum  Time of first page:  [phone]  MD notified (2nd page):  Time of second page:  Responding MD:  Delton CoombesByrum  Time MD responded:  [phone]

## 2015-07-23 NOTE — Progress Notes (Signed)
Inpatient Diabetes Program Recommendations  AACE/ADA: New Consensus Statement on Inpatient Glycemic Control (2015)  Target Ranges:  Prepandial:   less than 140 mg/dL      Peak postprandial:   less than 180 mg/dL (1-2 hours)      Critically ill patients:  140 - 180 mg/dL    Results for Maurice MarchCLARK, Saint J (MRN [id number]) as of 07/23/2015 09:03  Ref. Range 07/22/2015 12:09 07/22/2015 16:14 07/22/2015 19:58  Glucose-Capillary Latest Ref Range: 65-99 mg/dL 657161 (H) 846206 (H) 962201 (H)    Results for Maurice MarchCLARK, Waris J (MRN [id number]) as of 07/23/2015 09:03  Ref. Range 07/22/2015 23:41 07/23/2015 04:14 07/23/2015 07:58  Glucose-Capillary Latest Ref Range: 65-99 mg/dL 401191 (H) 027210 (H) 253212 (H)    Admit with: Respiratory Failure/ Hyperglycemia  History: DM, CHF, OSA  Home DM Meds: Humalog 75/25 Mix insulin- 75 units Breakfast/ 40 units Lunch/ 75 units Dinner  Current Insulin Orders: Lantus 25 units bid      Novolog Resistant SSI (0-20 units) Q4 hours    MD- Please consider increasing Lantus to 35 units bid (increase total dose to 0.25 units/kg dosing)    --Will follow patient during hospitalization--  Ambrose FinlandJeannine Johnston Kyair Ditommaso RN, MSN, CDE Diabetes Coordinator Inpatient Glycemic Control Team Team Pager: [phone] (8a-5p)

## 2015-07-23 NOTE — Progress Notes (Signed)
Patient placed on BIPAP HS on 18/10 and 50%FIO2. Patient tolerating well sat 99%. Patient in no distress. RT will continue to monitor as needed.

## 2015-07-23 NOTE — Progress Notes (Signed)
PULMONARY / CRITICAL CARE MEDICINE   Name: [name] MRN: [id number] DOB: 08-22-1971    ADMISSION DATE:  07/20/2015 CONSULTATION DATE:  07/20/15   REFERRING MD :  EDP   CHIEF COMPLAINT:  Hypercarbic Respiratory Failure  INITIAL PRESENTATION: 44 y/o M with PMH of morbid obesity, CHF, OSH/OSA who presented to [facility] via EMS on 10/18 with complaints of shortness of breath with chest pain.  Failed bipap therapy and was intubated.  Self extubated 10/20.    STUDIES:    SIGNIFICANT EVENTS: 10/18  Admit with SOB & chest pain.  Failed bipap therapy in ER and was intubated (awake per Dr. Tyson [name]) 10/19  ST in 120's, elevated peak pressures on vent, intermittent agitation.  Vent settings later changed from [facility] to PCV  10/20  Self extubated  SUBJECTIVE:  Pt denies pain / SOB.  More alert this am.  ABG noted 7.429/60.7.  Tmax 100.8, HR 120's-130's - dose of cardizem held last pm due to concern for swallowing & drowsiness.  Pt requesting to get out of bed.    VITAL SIGNS: Temp:  [98.8 F (37.1 C)-101.1 F (38.4 C)] 99 F (37.2 C) (10/21 1000) Pulse Rate:  [51-136] 136 (10/21 1000) Resp:  [0-46] 24 (10/21 1000) BP: (100-153)/(47-86) 113/65 mmHg (10/21 1000) SpO2:  [88 %-99 %] 98 % (10/21 1000) FiO2 (%):  [50 %-55 %] 50 % (10/21 0830) Weight:  [601 lb (272.612 kg)] 601 lb (272.612 kg) (10/21 0443)   HEMODYNAMICS: CVP:  [9 mmHg-14 mmHg] 12 mmHg   VENTILATOR SETTINGS: Vent Mode:  [-] BIPAP FiO2 (%):  [50 %-55 %] 50 % Set Rate:  [8 bmp] 8 bmp PEEP:  [10 cmH20] 10 cmH20   INTAKE / OUTPUT:  Intake/Output Summary (Last 24 hours) at 07/23/15 1049 Last data filed at 07/23/15 0900  Gross per 24 hour  Intake 1101.33 ml  Output   8710 ml  Net -7608.67 ml    PHYSICAL EXAMINATION: General:  Morbidly obese male in NAD Neuro:  Awake, alert, MAE HEENT:  MM pink/moist, unable to assess JVD Cardiovascular:  s1s2 rrr, tachy / irregular, distant tones  Lungs:  Even/non-labored, shallow,  distant breath sounds  Abdomen:  Obese/soft, bsx4 active  Musculoskeletal:  No acute deformities  Skin:  Warm/dry, generalized edema  LABS:  CBC  Recent Labs Lab 07/21/15 0420 07/22/15 0430 07/23/15 0420  WBC 14.3* 15.4* 13.6*  HGB 13.2 12.9* 13.4  HCT 41.5 40.5 42.8  PLT 313 278 280   Coag's  Recent Labs Lab 07/20/15 1404  INR 1.32   BMET  Recent Labs Lab 07/21/15 1420 07/21/15 1826 07/22/15 1415  NA 144 143 143  K 3.6 3.5 3.7  CL 99* 98* 94*  CO2 37* 36* 38*  BUN CREATININE 1.20 1.21 1.26*  GLUCOSE 142* 193* 217*   Electrolytes  Recent Labs Lab 07/20/15 2230 07/21/15 0420  07/21/15 1420 07/21/15 1826 07/22/15 0430 07/22/15 1415  CALCIUM 8.8* 9.0  < > 8.2* 8.0*  --  8.3*  MG 1.9 1.8  --   --   --  1.6*  --   PHOS 2.5 1.8*  --   --   --  3.7  --   < > = values in this interval not displayed.   Sepsis Markers  Recent Labs Lab 07/20/15 2230 07/21/15 0420 07/22/15 0430  PROCALCITON 0.29 0.23 0.17    ABG  Recent Labs Lab 07/21/15 0440 07/21/15 1100 07/23/15 0358  PHART 7.507* 7.426  7.429  PCO2ART 44.4 56.9* 60.7*  PO2ART 60.4* 71.8* 85.2   Liver Enzymes  Recent Labs Lab 07/20/15 1404  AST 33  ALT 22  ALKPHOS 96  BILITOT 1.6*  ALBUMIN 3.9   Cardiac Enzymes  Recent Labs Lab 07/20/15 1404  TROPONINI 0.04*   Glucose  Recent Labs Lab 07/22/15 1209 07/22/15 1614 07/22/15 1958 07/22/15 2341 07/23/15 0414 07/23/15 0758  GLUCAP 161* 206* 201* 191* 210* 212*    Imaging No results found.   ASSESSMENT / PLAN:  PULMONARY OETT 10/18 >> 10/20 (self extubated) A: Acute on Chronic Hypercarbic Respiratory Failure - lives at ~ 60 pCO2 Obesity Hypoventilation Syndrome / Obstructive Sleep Apnea  R>L Airspace disease - ? Effusions vs airspace diease.  PCT low.   P:   Mandatory BiPAP QHS & PRN daytime sleep  Trend CXR  Wean O2 for sats > 90%, baseline home O2 of 3L PRN albuterol  Continue lasix gtt for negative  balance, reduce to  /hr 10/21  Daily SBT / WUA  R/o PNA, see ID   CARDIOVASCULAR CVL R IJ 10/18 >>  A:  Hypotension - secondary to sedation, resolving  Hx HTN, AF/A-Flutter, CHF (05/10/15 ECHO >> EF 55-60%, difficult study due to habitus) P:  ICU monitoring  Continue xarelto Continue diltiazem, toprolol  Lasix gtt as above   RENAL A:   AKI  At Risk Hypokalemia with Diuresis   Hypomagnesemia P:   Trend BMP / UOP  Place foley for strict I/O's  KCL 40 mEq QD  Replace electrolytes as indicated Repeat BMP now to review serum cr / lytes  GASTROINTESTINAL A:   Morbid Obesity  P:   Nutrition consult  D/c ranitidine post extubation  HEMATOLOGIC A:   Mild Leukocytosis  P:  Trend CBC DVT prophylaxis:  Heparin SQ  INFECTIOUS A:   R>L Opacities on CXR - ? Effusion vs airspace disease, PCT low Oral Thrush  P:   BCx2 10/18 >>  UA 10/18 >> neg UC 10/18 >> neg Sputum 10/18 >>   Vanco, start date 10/18, day 4/4 Zosyn, start date 10/18, day 4/4  Trend PCT  PRN tylenol for temp 101.5 or greater D/c abx 10/21, monitor off Nystatin   ENDOCRINE A:   DM II / Hyperglycemia - negative ketones on UA.  A1c > 8.9.  Basal insulin requirement ~ 80 units lantus per day P:   Increase lantus to 30 BID for total of 60 QD Basal insulin @ 0.2 units per kg Resistant SSI  Hold home medications  NEUROLOGIC A:   Acute Encephalopathy - secondary to hypercarbic respiratory failure  P:   PRN fentanyl  Daily WUA / SBT  PT consult   FAMILY  - Updates: No family at bedside.    - Inter-disciplinary family meet or Palliative Care meeting due by:  10/25   Evan Brim, NP-C [facility] Pgr: [phone] or if no answer [phone] 07/23/2015, 10:49 AM   Attending:  I have seen and examined the patient with nurse practitioner/resident and agree with the note above.   [name] is awake and alert today, eating, wants to get out of bed; was on BIPAP all night  after self extubating yesterday Lungs with crackles but normal effort, non-labored breathing.  CXR > improved aeration in left lung, airspace disease noted in right lung still  Acute respiratory failure with hypoxemia> due to acute pulmonary edema> continue diuresis today, hold antibiotics, continue lasix gtt; BMET OK, continue KCL supp  with lasix gtt Morbid obesity with deconditioning, consult PT  Rest as above  [facility]  Heber CarolinaBrent McQuaid, MD [place] [facility] Pager: [phone] Cell: [phone] After 3pm or if no response, call [phone]

## 2015-07-23 NOTE — Progress Notes (Signed)
Nutrition Follow-up  DOCUMENTATION CODES:   Morbid obesity  INTERVENTION:  - Will order Boost Breeze TID, each supplement provides 250 kcal and 9 grams of protein - Will decrease Prostat to 30 mL TID, each supplement provides 100 kcal and 15 grams of protein - Diet advancement as medically feasible - RD will continue to monitor for needs  NUTRITION DIAGNOSIS:   Inadequate protein intake related to other (see comment) (current diet order) as evidenced by other (see comment) (CLD does not meet protein needs). -updated  GOAL:   Patient will meet greater than or equal to 90% of their needs -unmet  MONITOR:   Diet advancement, Supplement acceptance, Weight trends, Labs, Skin, I & O's  ASSESSMENT:   44 y/o M with PMH of morbid obesity, CHF, OSH/OSA who [facility] with complaints of shortness of breath with chest pain. Failed bipap therapy and was intubated.   10/21 Pt extubated yesterday evening and diet advanced to CLD. Nutrition needs updated s/p extubation. Pt very drowsy this AM. He states had some liquids this AM but unable to state what he had. Pt needed to be awoken several times and unable to stay awake for more than a few seconds each time.  Will need to determine PTA information at follow-up. Will order supplements to aid in protein consumption. Not meeting needs. Medications reviewed. Labs reviewed; CBGs: 135-278 mg/dL, Cl: 94 mmol/L, creatinine elevated, Ca: 8.3 mg/dL.   10/20 - Tube feeding was started 10/19 on Vital HP.  - Vital HP now infusing @ 40 ml/hr, this is providing 960 kcal and 84g protein. - RD adjusted TF order to better meet protein and calorie needs. Unable to meet all of protein needs d/t high Propofol rate. - Patient is currently intubated on ventilator support - MV: 13.2 L/min; Propofol: 35.2 ml/hr (929 kcal)  Diet Order:  Diet clear liquid Room service appropriate?: Yes; Fluid consistency:: Thin  Skin:  Reviewed, no  issues  Last BM:  10/18  Height:   Ht Readings from Last 1 Encounters:  07/21/15 6\' 4"  (1.93 m)    Weight:   Wt Readings from Last 1 Encounters:  07/23/15 601 lb (272.612 kg)    Ideal Body Weight:  91.8 kg  BMI:  Body mass index is 73.19 kg/(m^2).  Estimated Nutritional Needs:   Kcal:  2400-2600  Protein:  140-155 grams  Fluid:  2.5L/day  EDUCATION NEEDS:   No education needs identified at this time     Trenton GammonJessica Zianne Schubring, RD, LDN Inpatient Clinical Dietitian Pager # [phone] After hours/weekend pager # [phone]

## 2015-07-23 NOTE — Progress Notes (Deleted)
Second attempt to give report to receiving [facility] Unable to talk to RN. Secretary is made aware that carelink is on their way to get pt.  

## 2015-07-23 NOTE — Progress Notes (Signed)
ANTIBIOTIC CONSULT NOTE - follow-up  Pharmacy Consult for vancomycin and zosyn Indication: pneumonia  Allergies  Allergen Reactions  . Iodine Anaphylaxis and Other (See Comments)    Associated with the shellfish allergy  . Shellfish Allergy Anaphylaxis    Patient Measurements: Height:  (193 cm) Weight: (!) 601 lb (272.612 kg) IBW/kg (Calculated) : 86.8   Vital Signs: Temp: 99 F (37.2 C) (10/21 1000) Temp Source: Core (Comment) (10/21 0505) BP: 113/65 mmHg (10/21 1000) Pulse Rate: 136 (10/21 1000) Intake/Output from previous day: 10/20 0701 - 10/21 0700 In: 1417.3 [P.O.:110; I.V.:297.3; NG/GT:180; IV Piggyback:700] Out: 8325 [Urine:8325] Intake/Output from this shift: Total I/O In: 24 [I.V.:24] Out: 760 [Urine:760]  Labs:  Recent Labs  07/21/15 0420  07/21/15 1420 07/21/15 1826 07/22/15 0430 07/22/15 1415 07/23/15 0420  WBC 14.3*  --   --   --  15.4*  --  13.6*  HGB 13.2  --   --   --  12.9*  --  13.4  PLT 313  --   --   --  278  --  280  CREATININE 1.15  < > 1.20 1.21  --  1.26*  --   < > = values in this interval not displayed. Estimated Creatinine Clearance: 168.7 mL/min (by C-G formula based on Cr of 1.26). No results for input(s): VANCOTROUGH, VANCOPEAK, VANCORANDOM, GENTTROUGH, GENTPEAK, GENTRANDOM, TOBRATROUGH, TOBRAPEAK, TOBRARND, AMIKACINPEAK, AMIKACINTROU, AMIKACIN in the last 72 hours.   Microbiology: Recent Results (from the past 720 hour(s))  Culture, blood (routine x 2)     Status: None (Preliminary result)   Collection Time: 07/20/15 10:15 AM  Result Value Ref Range Status   Specimen Description BLOOD LEFT HAND  Final   Special Requests IN PEDIATRIC BOTTLE 3CC  Final   Culture   Final    NO GROWTH 2 DAYS Performed at [facility]    Report Status PENDING  Incomplete  Culture, blood (routine x 2)     Status: None (Preliminary result)   Collection Time: 07/20/15  2:04 PM  Result Value Ref Range Status   Specimen Description  BLOOD LEFT ANTECUBITAL  Final   Special Requests BOTTLES DRAWN AEROBIC AND ANAEROBIC 5CC  Final   Culture   Final    NO GROWTH 2 DAYS Performed at [facility]    Report Status PENDING  Incomplete  MRSA PCR Screening     Status: None   Collection Time: 07/20/15 10:01 PM  Result Value Ref Range Status   MRSA by PCR NEGATIVE NEGATIVE Final    Comment:        The GeneXpert MRSA Assay (FDA approved for NASAL specimens only), is one component of a comprehensive MRSA colonization surveillance program. It is not intended to diagnose MRSA infection nor to guide or monitor treatment for MRSA infections.   Urine culture     Status: None   Collection Time: 07/20/15 10:39 PM  Result Value Ref Range Status   Specimen Description URINE, CATHETERIZED  Final   Special Requests NONE  Final   Culture   Final    NO GROWTH 2 DAYS Performed at [facility]    Report Status 07/22/2015 FINAL  Final    Medical History: Past Medical History  Diagnosis Date  . CHF (congestive heart failure) (HCC)     EF55-60%  . HTN (hypertension)   . Ptosis   . Atrial flutter (HCC)   . Atrial fibrillation (HCC)   . Morbid obesity (HCC)   .  DM2 (diabetes mellitus, type 2) (HCC)   . Depression   . Chronic low back pain   . Asthma   . Vision abnormalities   . Abscess     right thigh  . Sleep apnea   . Chronic respiratory failure (HCC)   . Obesity hypoventilation syndrome [facility](HCC)    Assessment: Patient's a 44 y.o obese M who presented to the ED on 10/18 with c/o SOB and cough. CXR showed bilateral opacities with concern for PNA or edema.  Patient's now intubated.  To start broad abx for r/o PNA.   10/18 vanc>> 10/18 zosyn>.  10/18: urine: NGF 10/18: Bloodx2: NGTD  Renal: Scr stable  WBC improved Tm = 100.8  Goal of Therapy:  Vancomycin trough level 15-20 mcg/ml  Plan:  Day #4 antibiotics - zosyn 3.375 gm IV q8h infuse over 4 hours - vancomycin 1250 mg IV q12h.   - Will check  steady state level if vancomycin to continue  Juliette Alcideustin Zeigler, PharmD, BCPS.   Pager: [phone] 07/23/2015,10:39 AM

## 2015-07-23 NOTE — Progress Notes (Signed)
eLink Physician-Brief Progress Note Patient Name: [name] DOB: 10/20/1970 MRN: [id number]   Date of Service  07/23/2015  HPI/Events of Note  Patient c/o back and leg pain. No relief with Tylenol.  eICU Interventions  Will order Motrin 400 mg PO now.      Intervention Category Intermediate Interventions: Pain - evaluation and management  [name] 07/23/2015, 10:09 PM

## 2015-07-23 NOTE — Progress Notes (Signed)
eLink Physician-Brief Progress Note Patient Name: [name] DOB: 11/15/1970 MRN: [id number]   Date of Service  07/23/2015  HPI/Events of Note  Called regarding pt's MS. He is tolerating BiPAP but RN does not believe he can safely swallow diltiazem. Will hold this dose and follow, w regard to his MS will continue BiPAp and chack ABG in am. He is at risk for recurrent resp failure, ultimately trach  eICU Interventions       Intervention Category Evaluation Type: Other  Evan Marcelli S. 07/23/2015, 12:14 AM

## 2015-07-24 ENCOUNTER — Encounter (HOSPITAL_COMMUNITY): Payer: Self-pay | Admitting: Internal Medicine

## 2015-07-24 DIAGNOSIS — Z6841 Body Mass Index (BMI) 40.0 and over, adult: Secondary | ICD-10-CM

## 2015-07-24 DIAGNOSIS — L8990 Pressure ulcer of unspecified site, unspecified stage: Secondary | ICD-10-CM

## 2015-07-24 DIAGNOSIS — Z794 Long term (current) use of insulin: Secondary | ICD-10-CM

## 2015-07-24 DIAGNOSIS — E1165 Type 2 diabetes mellitus with hyperglycemia: Secondary | ICD-10-CM

## 2015-07-24 DIAGNOSIS — I480 Paroxysmal atrial fibrillation: Secondary | ICD-10-CM

## 2015-07-24 DIAGNOSIS — E662 Morbid (severe) obesity with alveolar hypoventilation: Secondary | ICD-10-CM

## 2015-07-24 DIAGNOSIS — I5033 Acute on chronic diastolic (congestive) heart failure: Secondary | ICD-10-CM

## 2015-07-24 DIAGNOSIS — G9340 Encephalopathy, unspecified: Secondary | ICD-10-CM

## 2015-07-24 DIAGNOSIS — G4733 Obstructive sleep apnea (adult) (pediatric): Secondary | ICD-10-CM

## 2015-07-24 LAB — BASIC METABOLIC PANEL
Anion gap: 6 (ref 5–15)
BUN: 18 mg/dL (ref 6–20)
CALCIUM: 8.3 mg/dL — AB (ref 8.9–10.3)
CHLORIDE: 94 mmol/L — AB (ref 101–111)
CO2: 43 mmol/L — AB (ref 22–32)
CREATININE: 1.23 mg/dL (ref 0.61–1.24)
GFR calc non Af Amer: >60 mL/min (ref >60)
GLUCOSE: 157 mg/dL — AB (ref 65–99)
Potassium: 3.5 mmol/L (ref 3.5–5.1)
Sodium: 143 mmol/L (ref 135–145)

## 2015-07-24 LAB — CBC
HEMATOCRIT: 41.2 % (ref 39.0–52.0)
Hemoglobin: 12.9 g/dL — ABNORMAL LOW (ref 13.0–17.0)
MCH: 27.6 pg (ref 26.0–34.0)
MCHC: 31.3 g/dL (ref 30.0–36.0)
MCV: 88.2 fL (ref 78.0–100.0)
PLATELETS: 293 10*3/uL (ref 150–400)
RBC: 4.67 MIL/uL (ref 4.22–5.81)
RDW: 17.2 % — AB (ref 11.5–15.5)
WBC: 12.2 10*3/uL — ABNORMAL HIGH (ref 4.0–10.5)

## 2015-07-24 LAB — GLUCOSE, CAPILLARY
GLUCOSE-CAPILLARY: 154 mg/dL — AB (ref 65–99)
Glucose-Capillary: 275 mg/dL — ABNORMAL HIGH (ref 65–99)
Glucose-Capillary: 159 mg/dL — ABNORMAL HIGH (ref 65–99)
Glucose-Capillary: 226 mg/dL — ABNORMAL HIGH (ref 65–99)
Glucose-Capillary: 332 mg/dL — ABNORMAL HIGH (ref 65–99)
Glucose-Capillary: 322 mg/dL — ABNORMAL HIGH (ref 65–99)

## 2015-07-24 LAB — MAGNESIUM: Magnesium: 1.9 mg/dL (ref 1.7–2.4)

## 2015-07-24 MED ORDER — INSULIN ASPART 100 UNIT/ML ~~LOC~~ SOLN
0.0000 [IU] | Freq: Every day | SUBCUTANEOUS | Status: DC
  Administered 2015-07-24: 4 [IU] via SUBCUTANEOUS

## 2015-07-24 MED ORDER — INSULIN GLARGINE 100 UNIT/ML ~~LOC~~ SOLN
35.0000 [IU] | Freq: Two times a day (BID) | SUBCUTANEOUS | Status: DC
  Administered 2015-07-24 – 2015-07-26 (×5): 35 [IU] via SUBCUTANEOUS
  Filled 2015-07-24 (×5): qty 0.35

## 2015-07-24 MED ORDER — FUROSEMIDE 10 MG/ML IJ SOLN
60.0000 mg | Freq: Two times a day (BID) | INTRAMUSCULAR | Status: DC
  Administered 2015-07-24 – 2015-07-25 (×3): 60 mg via INTRAVENOUS
  Filled 2015-07-24 (×3): qty 6

## 2015-07-24 MED ORDER — ALPRAZOLAM 0.25 MG PO TABS
0.2500 mg | ORAL_TABLET | Freq: Once | ORAL | Status: AC
  Administered 2015-07-24: 0.25 mg via ORAL
  Filled 2015-07-24: qty 1

## 2015-07-24 MED ORDER — NYSTATIN 100000 UNIT/GM EX POWD
Freq: Three times a day (TID) | CUTANEOUS | Status: DC
  Administered 2015-07-24: 23:00:00 via TOPICAL
  Administered 2015-07-24 (×2): 1 g via TOPICAL
  Administered 2015-07-25 – 2015-07-26 (×5): via TOPICAL
  Filled 2015-07-24 (×2): qty 15

## 2015-07-24 MED ORDER — INSULIN ASPART 100 UNIT/ML ~~LOC~~ SOLN
0.0000 [IU] | Freq: Three times a day (TID) | SUBCUTANEOUS | Status: DC
  Administered 2015-07-24: 7 [IU] via SUBCUTANEOUS
  Administered 2015-07-24: 11 [IU] via SUBCUTANEOUS

## 2015-07-24 NOTE — Evaluation (Signed)
Physical Therapy Evaluation Patient Details Name: Evan [name] MRN: [id number] DOB: 1970-11-25 Today's Date: [name]  Evan [name]  44 y/o M with PMH of morbid obesity, CHF, OSH/OSA who presented to [facility] via EMS on 10/18 with complaints of shortness of breath with chest pain. Failed bipap therapy and was intubated.   Clinical Impression  Patient is pleasant and motivated. The limitation currently is   appropriate safe patient  lift pad not available for >600 pounds. Patient would also need to  Be in a room with a maxisky equipment to accommodate > 600# which he currently is not. The patient has not stood for about 1 week and attempts to stand from Bariatric bed is deemed unsafe. RN aware of this and Dr. Darnelle [name].is aware. Patient will benefit from PT to address problems listed  In note below.    Follow Up Recommendations LTACH;Supervision/Assistance - 24 hour    Equipment Recommendations  None recommended by PT    Recommendations for Other Services       Precautions / Restrictions        Mobility  Bed Mobility Overal bed mobility: Needs Assistance;+2 for physical assistance;+ 2 for safety/equipment Bed Mobility: Rolling;Supine to Sit Rolling: Mod assist;+2 for safety/equipment   Supine to sit: +2 for safety/equipment;Mod assist     General bed mobility comments: practiced sitting upright in the bed with use of rails. Due to body habtus could sit only partially with max effort. uses leg momentum for assist with rolling.  Transfers                 General transfer comment: [place] due to required Safe Patient Handling equipment is not available. No sling and needs maxisky that accommodates >600 pounds  Ambulation/Gait                Stairs            Wheelchair Mobility    Modified Rankin (Stroke Patients Only)       Balance                                             Pertinent Vitals/Pain      Home Living  Family/patient expects to be discharged to:: Private residence Living Arrangements: Non-relatives/Friends             Home Equipment: Environmental consultant - 4 wheels;Electric scooter;Shower seat      Prior Function Level of Independence: Needs assistance;Independent with assistive device(s)      ADL's / Homemaking Assistance Needed: only sponge bathes        Hand Dominance        Extremity/Trunk Assessment   Upper Extremity Assessment: LUE deficits/detail       LUE Deficits / Details: decreased strength L shoulder flexion but does reach back for rails to assist sliding up in bed   Lower Extremity Assessment: RLE deficits/detail;Generalized weakness RLE Deficits / Details: patient states+ MY lLEFT FOOT FEELS NUMB" . Noted decreased active dorsiflexion  at a 1+ /5 and 2/5 plantarflexion. also has large blister on plantar foefoot and great toe on plantar surface.    Cervical / Trunk Assessment: Other exceptions  Communication      Cognition  General Comments      Exercises General Exercises - Lower Extremity Quad Sets: AROM;Both;10 reps      Assessment/Plan    PT Assessment Patient needs continued PT services  PT Diagnosis Difficulty walking;Generalized weakness   PT Problem List Decreased strength;Decreased range of motion;Decreased activity tolerance;Decreased mobility;Obesity;Impaired sensation;Decreased knowledge of precautions;Decreased safety awareness;Decreased knowledge of use of DME;Decreased skin integrity  PT Treatment Interventions DME instruction;Functional mobility training;Therapeutic activities;Therapeutic exercise;Patient/family education   PT Goals (Current goals can be found in the Care Plan section) Acute Rehab PT Goals Patient Stated Goal: to get OOB, get back to school PT Goal Formulation: With patient Time For Goal Achievement: 08/07/15 Potential to Achieve Goals: Good    Frequency Min 3X/week   Barriers to  discharge Decreased caregiver support      Co-evaluation               End of Session   Activity Tolerance: Patient tolerated treatment well Patient left: in bed;with call bell/phone within reach Nurse Communication: Mobility status;Need for lift equipment (spoke with RN about the concerns for any attmpts to stand patient due to not having appropriate lift equipment for safety of patient and staff. MD aware also. )         Time: 1610-9604: 0954-1032 PT Time Calculation (min) (ACUTE ONLY): 38 min   Charges:   PT Evaluation $Initial PT Evaluation Tier I: 1 Procedure PT Treatments $Therapeutic Activity: 23-37 mins   PT G Codes:        Evan [name], Evan [name], 12:56 PM Evan KelchKaren Lileigh [name] PT [phone]

## 2015-07-24 NOTE — Progress Notes (Addendum)
Progress Note   [name] Meenach ZOX:[id number] DOB: 07/16/1971 DOA: 07/20/2015 PCP: Emeterio ReeveWOLTERS,SHARON A, MD   Brief Narrative:   [name] Kump is an 44 y.o. male with a PMH of morbid obesity, atrial fibrillation, hypertension, diabetes, CHF with EF of 55-60 percent by echo done 05/10/15 who was admitted 07/20/15 with acute on chronic hypercapnic respiratory failure requiring intubation after failing BiPAP therapy. He self extubated 07/22/15. He was managed by the critical care team until 07/24/15 when [facility]  Assessment/Plan:   Principal Problem:   Acute on chronic respiratory failure with hypercapnia (HCC) in the setting of obesity hypoventilation syndrome and obstructive sleep apnea - Intubated 07/20/15-07/22/15. Now on BiPAP when necessary at bedtime and when sleeping. - Continue supplemental oxygen. Uses 3 L of oxygen at baseline and he has been weaned to this. - Continue bronchodilators as needed. - Respiratory status stable.  Active Problems:   Blisters right ventral foot, right posterior leg / pressure sore - Wound care RN consult requested.    Thrush - Continue nystatin.    Acute on chronic diastolic CHF - Currently being managed with a Lasix drip at 5 mL/hour.  Switch to 60 mg Lasix IV BID. - Weight down approximately 30 pounds since admission but I/O balanced over the past 24 hours.    Hypomagnesemia - Repleted.    Acute encephalopathy secondary to hypercarbic respiratory failure - Resolving.    Morbid obesity with body mass index of 60.0-69.9 in adult [facility](HCC) - Dietary counseling requested per dietitian. Counseled 07/23/15.    HTN (hypertension), benign - Continue Cardizem, Lasix, metoprolol.    PAF (paroxysmal atrial fibrillation) (HCC) - Continue chronic anticoagulation with Xarelto. - Continue diltiazem and Toprol for rate control.    DM (diabetes mellitus), type 2, uncontrolled (HCC)/  Hyperosmolar non-ketotic state in patient with  type 2 diabetes mellitus (HCC) - Hemoglobin A1c is 8.9%. - Currently being managed by Lantus 30 units twice a day, and resistant scale SSI. CBGs 212-302. - Increase Lantus to 35 units twice a day. - We'll ask diabetes coordinator to see.    Healthcare-associated pneumonia - Blood cultures, sputum cultures negative to date. - Continue vancomycin and Zosyn.    DVT Prophylaxis - Lovenox ordered.  Family Communication: No family at the bedside. Disposition Plan: Home when respiratory status stable and patient mobile.  Lives alone.  May need SNF.  PT unable to get patient up. Code Status:     Code Status Orders        Start     Ordered   07/20/15 1628  Full code   Continuous     07/20/15 1633      IV Access:    Peripheral IV   Procedures and diagnostic studies:   Dg Chest Port 1 View  07/23/2015  CLINICAL DATA:  Persistent severe shortness of breath. Chronic respiratory failure. EXAM: PORTABLE CHEST 1 VIEW COMPARISON:  Multiple previous chest x-rays. FINDINGS: The heart remains enlarged. The endotracheal tube is been removed. The NG tube is been removed. The right IJ catheter is stable. Persistent asymmetric airspace process in the right lung. This could be asymmetric pulmonary edema or infiltrates. IMPRESSION: Removal of the ET and NG tubes. Persistent cardiac enlargement and asymmetric airspace process. Electronically Signed   By: Rudie MeyerP.  Gallerani M.D.   On: 07/23/2015 13:20   Dg Chest Port 1 View  07/22/2015  CLINICAL DATA:  Respiratory failure.  Intubation. EXAM: PORTABLE CHEST 1 VIEW COMPARISON:  07/21/2015. FINDINGS: Limited view of the chest obtained. Mid joint right hemi thorax not imaged. Low lung volumes. Persistent diffuse infiltrate right lung. Persistent left lower lobe infiltrate and/or atelectasis. No pneumothorax visualized . IMPRESSION: 1.  Limited view of the chest.  Lines and tubes in stable position. 2. Low lung volumes. Persistent diffuse right lung and left  lower lobe infiltrate. No interim change. 3.  Persistent cardiomegaly. Electronically Signed   By: Maisie Fus  Register   On: 07/22/2015 07:31   Dg Chest Port 1 View  07/21/2015  CLINICAL DATA:  Acute onset of respiratory failure. Initial encounter. EXAM: PORTABLE CHEST 1 VIEW COMPARISON:  Chest radiograph performed 07/20/2015 FINDINGS: The patient's endotracheal tube is seen ending 3-4 cm above the carina. A right IJ line is noted ending about the mid SVC. An enteric tube is noted extending below the diaphragm. A small to moderate right-sided pleural effusion is noted. Diffuse right-sided airspace opacification and mild left basilar airspace opacity may reflect asymmetric pulmonary edema or pneumonia. ARDS cannot be excluded. No pneumothorax is seen. The cardiomediastinal silhouette is borderline normal in size. No acute osseous abnormalities are seen. IMPRESSION: 1. Endotracheal tube seen ending 3-4 cm above the carina. 2. Small to moderate right-sided pleural effusion noted. Right-sided airspace opacification and mild left basilar airspace opacity may reflect asymmetric pulmonary edema or pneumonia. ARDS cannot be excluded. Electronically Signed   By: Roanna Raider M.D.   On: 07/21/2015 05:44   Dg Chest Portable 1 View  07/20/2015  CLINICAL DATA:  Respiratory distress. EXAM: PORTABLE CHEST 1 VIEW COMPARISON:  Same day. FINDINGS: Stable cardiomegaly. Evaluation of lung bases is limited due to body habitus. Stable bilateral lung opacities are noted as described on prior exam. Right internal jugular catheter is noted with distal tip in the expected position of the right innominate vein. No pneumothorax is noted. Endotracheal tube is seen projected over the tracheal air shadow with distal tip 8 cm above the carina. IMPRESSION: Limited examination due to body habitus. Stable right lung opacities with right greater than left. Endotracheal tube is in grossly good position. Right internal jugular catheter is noted  with distal tip in expected position of right innominate vein. Electronically Signed   By: Lupita Raider, M.D.   On: 07/20/2015 16:41   Dg Chest Port 1 View  07/20/2015  CLINICAL DATA:  Shortness of breath. EXAM: PORTABLE CHEST 1 VIEW COMPARISON:  May 19, 2015. FINDINGS: Stable cardiomegaly. No pneumothorax is noted. Central pulmonary vascular congestion is noted. Increased bilateral lung opacities are noted concerning for edema or possibly pneumonia, with right greater than left. Lung bases are not well visualized due to body habitus. Visualized portion of bony thorax is intact. IMPRESSION: Central pulmonary vascular congestion is noted with bilateral lung opacities concerning for edema or pneumonia. Images are limited due to body habitus. Electronically Signed   By: Lupita Raider, M.D.   On: 07/20/2015 14:02     Medical Consultants:    PCCM  Anti-Infectives:   Anti-infectives    Start     Dose/Rate Route Frequency Ordered Stop   07/21/15 0600  vancomycin (VANCOCIN) 1,250 mg in sodium chloride 0.9 % 250 mL IVPB  Status:  Discontinued     1,250 mg 166.7 mL/hr over 90 Minutes Intravenous Every 12 hours 07/20/15 1657 07/23/15 1108   07/20/15 2100  piperacillin-tazobactam (ZOSYN) IVPB 3.375 g  Status:  Discontinued     3.375 g 12.5 mL/hr over 240 Minutes Intravenous Every 8 hours 07/20/15  1648 07/23/15 1108   07/20/15 1700  vancomycin (VANCOCIN) 2,500 mg in sodium chloride 0.9 % 500 mL IVPB     2,500 mg 250 mL/hr over 120 Minutes Intravenous  Once 07/20/15 1646 07/20/15 1920   07/20/15 1515  piperacillin-tazobactam (ZOSYN) IVPB 3.375 g     3.375 g 100 mL/hr over 30 Minutes Intravenous  Once 07/20/15 1503 07/20/15 1656      Subjective:   Evan March is awake and alert.  Breathing better.  Hungry, wants to eat.  Reports some back and leg pain.  Given Motrin for this yesterday after Tylenol failed to alleviate it.  PT unable to mobilize the patient.  Objective:    Filed  Vitals:   07/24/15 0700 07/24/15 0739 07/24/15 0800 07/24/15 0900  BP: 131/67  134/81 140/82  Pulse: 86 80 91 90  Temp: 98.4 F (36.9 C) 98.8 F (37.1 C) 99 F (37.2 C) 99.1 F (37.3 C)  TempSrc:  Core (Comment)    Resp: 35 26 14 21   Height:      Weight:      SpO2: 97% 94% 95% 94%    Intake/Output Summary (Last 24 hours) at 07/24/15 0951 Last data filed at 07/24/15 0900  Gross per 24 hour  Intake 2788.15 ml  Output   1640 ml  Net 1148.15 ml   Filed Weights   07/21/15 0400 07/22/15 0414 07/23/15 0443  Weight: 286.991 kg (632 lb 11.2 oz) 275.333 kg (607 lb) 272.612 kg (601 lb)    Exam: Gen:  NAD, morbidly obese Cardiovascular:  HSIR, No M/R/G Respiratory:  Lungs diminished Gastrointestinal:  Abdomen soft, NT/ND, + BS Extremities: + Edema, large fluid filled hemorrhagic blister to ventral right foot, blisters with clear fluid right posterior lower leg   Data Reviewed:    Labs: Basic Metabolic Panel:  Recent Labs Lab 07/20/15 2230 07/21/15 0420  07/21/15 1420 07/21/15 1826 07/22/15 0430 07/22/15 1415 07/23/15 1130 07/24/15 0512  NA 142 143  < > 144 143  --  143 142 143  K 4.4 3.6  < > 3.6 3.5  --  3.7 3.8 3.5  CL 102 100*  < > 99* 98*  --  94* 94* 94*  CO2 34* 36*  < > 37* 36*  --  38* 40* 43*  GLUCOSE 344* 149*  < > 142* 193*  --  217* 246* 157*  BUN 18 17  < > 15 15  --  16 15 18   CREATININE 1.21 1.15  < > 1.20 1.21  --  1.26* 1.20 1.23  CALCIUM 8.8* 9.0  < > 8.2* 8.0*  --  8.3* 8.2* 8.3*  MG 1.9 1.8  --   --   --  1.6*  --  1.9 1.9  PHOS 2.5 1.8*  --   --   --  3.7  --   --   --   < > = values in this interval not displayed. GFR Estimated Creatinine Clearance: 172.8 mL/min (by C-G formula based on Cr of 1.23). Liver Function Tests:  Recent Labs Lab 07/20/15 1404  AST 33  ALT 22  ALKPHOS 96  BILITOT 1.6*  PROT 8.4*  ALBUMIN 3.9    Recent Labs Lab 07/20/15 1404  LIPASE 21*   Coagulation profile  Recent Labs Lab 07/20/15 1404  INR  1.32    CBC:  Recent Labs Lab 07/20/15 1404  07/20/15 2230 07/21/15 0420 07/22/15 0430 07/23/15 0420 07/24/15 0512  WBC 14.6*  --  14.3* 14.3* 15.4* 13.6* 12.2*  NEUTROABS 12.5*  --  11.0*  --   --   --   --   HGB 14.7  < > 13.1 13.2 12.9* 13.4 12.9*  HCT 45.8  < > 40.5 41.5 40.5 42.8 41.2  MCV 87.1  --  85.6 85.9 86.9 87.3 88.2  PLT 307  --  315 313 278 280 293  < > = values in this interval not displayed. Cardiac Enzymes:  Recent Labs Lab 07/20/15 1404  TROPONINI 0.04*   CBG:  Recent Labs Lab 07/23/15 1546 07/23/15 2002 07/23/15 2357 07/24/15 0455 07/24/15 0758  GLUCAP 250* 302* 275* 159* 154*   Sepsis Labs:  Recent Labs Lab 07/20/15 2230 07/21/15 0420 07/22/15 0430 07/23/15 0420 07/24/15 0512  PROCALCITON 0.29 0.23 0.17  --   --   WBC 14.3* 14.3* 15.4* 13.6* 12.2*   Microbiology Recent Results (from the past 240 hour(s))  Culture, blood (routine x 2)     Status: None (Preliminary result)   Collection Time: 07/20/15 10:15 AM  Result Value Ref Range Status   Specimen Description BLOOD LEFT HAND  Final   Special Requests IN PEDIATRIC BOTTLE 3CC  Final   Culture   Final    NO GROWTH 4 DAYS Performed at [facility]    Report Status PENDING  Incomplete  Culture, blood (routine x 2)     Status: None (Preliminary result)   Collection Time: 07/20/15  2:04 PM  Result Value Ref Range Status   Specimen Description BLOOD LEFT ANTECUBITAL  Final   Special Requests BOTTLES DRAWN AEROBIC AND ANAEROBIC 5CC  Final   Culture   Final    NO GROWTH 4 DAYS Performed at [facility]    Report Status PENDING  Incomplete  MRSA PCR Screening     Status: None   Collection Time: 07/20/15 10:01 PM  Result Value Ref Range Status   MRSA by PCR NEGATIVE NEGATIVE Final    Comment:        The GeneXpert MRSA Assay (FDA approved for NASAL specimens only), is one component of a comprehensive MRSA colonization surveillance program. It is not intended to  diagnose MRSA infection nor to guide or monitor treatment for MRSA infections.   Urine culture     Status: None   Collection Time: 07/20/15 10:39 PM  Result Value Ref Range Status   Specimen Description URINE, CATHETERIZED  Final   Special Requests NONE  Final   Culture   Final    NO GROWTH 2 DAYS Performed at [facility]    Report Status 07/22/2015 FINAL  Final     Medications:   . antiseptic oral rinse  7 mL Mouth Rinse q12n4p  . chlorhexidine  15 mL Mouth Rinse BID  . diltiazem  60 mg Oral 4 times per day  . feeding supplement  1 Container Oral TID BM  . feeding supplement (PRO-STAT SUGAR FREE 64)  30 mL Oral TID BM  . furosemide  60 mg Intravenous Q12H  . insulin aspart  0-20 Units Subcutaneous TID WC  . insulin aspart  0-5 Units Subcutaneous QHS  . insulin glargine  35 Units Subcutaneous BID  . metoprolol tartrate  25 mg Oral BID  . nystatin  5 mL Oral QID  . potassium chloride  40 mEq Per Tube Daily  . rivaroxaban  20 mg Oral Q breakfast   Continuous Infusions:    Time spent: 35 minutes.  The patient is medically  complex with multiple co-morbidities and requires high complexity decision making.    LOS: 4 days   Hyrum Shaneyfelt  [facility] Pager [phone]. If unable to reach me by pager, please call my cell phone at [phone].  *Please refer to [email or web address], password [email or web address] to get updated schedule on who will round on this patient, as hospitalists switch teams weekly. If 7PM-7AM, please contact night-coverage at [email or web address], password [email or web address] for any overnight needs.  07/24/2015, 9:51 AM

## 2015-07-24 NOTE — Progress Notes (Signed)
[facility] Electrolyte Replacement Protocol  Patient Name: [name] DOB: 1970-11-24 MRN: [id number]  Date of Service  07/24/2015   HPI/Events of Note    Recent Labs Lab 07/20/15 2230 07/21/15 0420  07/21/15 1420 07/21/15 1826 07/22/15 0430 07/22/15 1415 07/23/15 1130 07/24/15 0512  NA 142 143  < > 144 143  --  143 142 143  K 4.4 3.6  < > 3.6 3.5  --  3.7 3.8 3.5  CL 102 100*  < > 99* 98*  --  94* 94* 94*  CO2 34* 36*  < > 37* 36*  --  38* 40* 43*  GLUCOSE 344* 149*  < > 142* 193*  --  217* 246* 157*  BUN 18 17  < > 15 15  --  _0 CREATININE 1.21 1.15  < > 1.20 1.21  --  1.26* 1.20 1.23  CALCIUM 8.8* 9.0  < > 8.2* 8.0*  --  8.3* 8.2* 8.3*  MG 1.9 1.8  --   --   --  1.6*  --  1.9 1.9  PHOS 2.5 1.8*  --   --   --  3.7  --   --   --   < > = values in this interval not displayed.  Estimated Creatinine Clearance: 172.8 mL/min (by C-G formula based on Cr of 1.23).  Intake/Output      10/21 0701 - 10/22 0700   P.O. 2200   I.V. (mL/kg) 337.2 (1.2)   Total Intake(mL/kg) 2537.2 (9.3)   Urine (mL/kg/hr) 2320 (0.4)   Total Output 2320   Net +217.2        - I/O DETAILED x24h    Total I/O In: 1060 [P.O.:910; I.V.:150] Out: 610 [Urine:610] - I/O THIS SHIFT    ASSESSMENT   eICURN Interventions     Electrolyte protocol criteria NOT met. Lab values NOT replaced per protocol. MD notified Dr. _____ eMD    ASSESSMENT: [place], Suhaila Troiano Nicole 07/24/2015, 6:42 AM

## 2015-07-24 NOTE — Progress Notes (Signed)
Received pt to room 1420 from ICU, alert and oriented, placed on telemetry and continuous pulse ox, call light with in reach, oriented to unit, VSS

## 2015-07-24 NOTE — Progress Notes (Signed)
Patient has fluid filled blisters on right buttock, bilateral, posterior thighs, and lateral legs. Covered blisters with foam dressings, until evaluated by the wound care nurse.

## 2015-07-24 NOTE — Progress Notes (Signed)
BIPAP set up at bedside.  Patient not ready at this time. Will call when ready to be placed on.

## 2015-07-24 NOTE — Progress Notes (Signed)
Inpatient Diabetes Program Recommendations  AACE/ADA: New Consensus Statement on Inpatient Glycemic Control (2015)  Target Ranges:  Prepandial:   less than 140 mg/dL      Peak postprandial:   less than 180 mg/dL (1-2 hours)      Critically ill patients:  140 - 180 mg/dL   Review of Glycemic Control:  Results for Maurice MarchCLARK, Amarien J (MRN [id number]) as of 07/24/2015 17:38  Ref. Range 07/24/2015 07:58 07/24/2015 11:42 07/24/2015 15:43  Glucose-Capillary Latest Ref Range: 65-99 mg/dL 409154 (H) 811226 (H) 914332 (H)    Diabetes history: Type 2 diabetes Home DM Meds: Humalog 75/25 Mix insulin- 75 units Breakfast/ 40 units Lunch/ 75 units Dinner  Current Insulin Orders: Lantus 25 units bid  Novolog Resistant SSI (0-20 units) Q4 hours Inpatient Diabetes Program Recommendations:   Referral received.  Will see patient on 10/24.  May consider adding Novolog meal coverage 10 units tid with meals to cover post-prandial blood sugars.    Thanks, Beryl MeagerJenny Montie Swiderski, RN, BC-ADM Inpatient Diabetes Coordinator Pager [phone] (8a-5p)

## 2015-07-24 NOTE — Progress Notes (Signed)
Placed patient on BIPAP (black box) via [facility] 18/10 with 2.5 Lpm O2 bleed in.  Patient tolerating well at this time.

## 2015-07-24 NOTE — Progress Notes (Signed)
Handoff report called to [facility], Charity fundraiserN.  Patient transferring via hospital bed to room 1420.

## 2015-07-25 DIAGNOSIS — J9602 Acute respiratory failure with hypercapnia: Secondary | ICD-10-CM

## 2015-07-25 LAB — CULTURE, BLOOD (ROUTINE X 2)
CULTURE: NO GROWTH
Culture: NO GROWTH

## 2015-07-25 LAB — GLUCOSE, CAPILLARY
Glucose-Capillary: 198 mg/dL — ABNORMAL HIGH (ref 65–99)
Glucose-Capillary: 299 mg/dL — ABNORMAL HIGH (ref 65–99)
Glucose-Capillary: 216 mg/dL — ABNORMAL HIGH (ref 65–99)
Glucose-Capillary: 234 mg/dL — ABNORMAL HIGH (ref 65–99)

## 2015-07-25 LAB — BASIC METABOLIC PANEL
Anion gap: 9 (ref 5–15)
BUN: 17 mg/dL (ref 6–20)
CALCIUM: 8.6 mg/dL — AB (ref 8.9–10.3)
CHLORIDE: 92 mmol/L — AB (ref 101–111)
CO2: 39 mmol/L — ABNORMAL HIGH (ref 22–32)
CREATININE: 1.01 mg/dL (ref 0.61–1.24)
Glucose, Bld: 253 mg/dL — ABNORMAL HIGH (ref 65–99)
Potassium: 3.4 mmol/L — ABNORMAL LOW (ref 3.5–5.1)
SODIUM: 140 mmol/L (ref 135–145)

## 2015-07-25 MED ORDER — SODIUM CHLORIDE 0.9 % IJ SOLN
10.0000 mL | INTRAMUSCULAR | Status: DC | PRN
  Administered 2015-07-25: 10 mL
  Filled 2015-07-25: qty 40

## 2015-07-25 MED ORDER — FUROSEMIDE 40 MG PO TABS
80.0000 mg | ORAL_TABLET | Freq: Every day | ORAL | Status: DC
  Administered 2015-07-26: 80 mg via ORAL
  Filled 2015-07-25: qty 2

## 2015-07-25 MED ORDER — INSULIN ASPART 100 UNIT/ML ~~LOC~~ SOLN
0.0000 [IU] | Freq: Three times a day (TID) | SUBCUTANEOUS | Status: DC
  Administered 2015-07-25: 7 [IU] via SUBCUTANEOUS
  Administered 2015-07-25: 4 [IU] via SUBCUTANEOUS
  Administered 2015-07-25 – 2015-07-26 (×2): 11 [IU] via SUBCUTANEOUS
  Administered 2015-07-26: 7 [IU] via SUBCUTANEOUS
  Administered 2015-07-26: 11 [IU] via SUBCUTANEOUS

## 2015-07-25 MED ORDER — INSULIN ASPART 100 UNIT/ML ~~LOC~~ SOLN
6.0000 [IU] | Freq: Three times a day (TID) | SUBCUTANEOUS | Status: DC
  Administered 2015-07-25 – 2015-07-26 (×5): 6 [IU] via SUBCUTANEOUS

## 2015-07-25 MED ORDER — INSULIN ASPART 100 UNIT/ML ~~LOC~~ SOLN
0.0000 [IU] | Freq: Every day | SUBCUTANEOUS | Status: DC
  Administered 2015-07-25: 3 [IU] via SUBCUTANEOUS

## 2015-07-25 MED ORDER — POTASSIUM CHLORIDE CRYS ER 20 MEQ PO TBCR
20.0000 meq | EXTENDED_RELEASE_TABLET | Freq: Three times a day (TID) | ORAL | Status: DC
  Administered 2015-07-25 – 2015-07-26 (×4): 20 meq via ORAL
  Filled 2015-07-25 (×5): qty 1

## 2015-07-25 NOTE — Progress Notes (Signed)
Progress Note   DEVRON COHICK ZOX:[id number] DOB: 08-30-1971 DOA: 07/20/2015 PCP: Emeterio Reeve, MD   Brief Narrative:   OVA MEEGAN is an 44 y.o. male with a PMH of morbid obesity, atrial fibrillation, hypertension, diabetes, CHF with EF of 55-60 percent by echo done 05/10/15 who was admitted 07/20/15 with acute on chronic hypercapnic respiratory failure requiring intubation after failing BiPAP therapy. He self extubated 07/22/15. He was managed by the critical care team until 07/24/15 when his care was transferred to [facility].  Assessment/Plan:   Principal Problem:   Acute on chronic respiratory failure with hypercapnia (HCC) in the setting of obesity hypoventilation syndrome and obstructive sleep apnea - Intubated 07/20/15-07/22/15. Now on BiPAP when necessary at bedtime and when sleeping. - Continue supplemental oxygen. Uses 3 L of oxygen at baseline and he has been weaned to this. - Continue bronchodilators as needed. - Respiratory status stable.  Active Problems:   Blisters right ventral foot, right posterior leg / pressure sore - Wound care RN consult requested.    Thrush - Treated with nystatin.    Acute on chronic diastolic CHF - Initially managed with a Lasix drip at 5 mL/hour.  Now on 60 mg Lasix IV BID. We'll switch to 80 mg by mouth daily. - Weight down approximately 30 pounds since admission but I/O balanced over the past 24 hours.    Hypomagnesemia - Repleted.    Acute encephalopathy secondary to hypercarbic respiratory failure - Resolving.    Morbid obesity with body mass index of 60.0-69.9 in adult [facility]) - Dietary counseling requested per dietitian. Counseled 07/27/15.    HTN (hypertension), benign - Continue Cardizem, Lasix, metoprolol.    PAF (paroxysmal atrial fibrillation) (HCC) - Continue chronic anticoagulation with Xarelto. - Continue diltiazem and Toprol for rate control.    DM (diabetes mellitus), type 2, uncontrolled (HCC)/  Hyperosmolar  non-ketotic state in patient with type 2 diabetes mellitus (HCC) - Hemoglobin A1c is 8.9%. - Currently being managed by Lantus 35 units twice a day, and resistant scale SSI. CBGs [id number]. - Add meal coverage. - We'll ask diabetes coordinator to see.    Possible Healthcare-associated pneumonia - Blood cultures, sputum cultures negative to date. - Vancomycin and Zosyn discontinued 07-27-15.    DVT Prophylaxis - Lovenox ordered.  Family Communication: No family at the bedside. Disposition Plan: ? LTAC versus SNF for rehabilitation.  Lives alone.  PT unable to get patient up. Likely medically stable for discharge 07/26/15 if he has a bed at a facility that can manage his care given his morbid obesity and current immobility. Code Status:     Code Status Orders        Start     Ordered   07/20/15 1628  Full code   Continuous     07/20/15 1633      IV Access:    IJ left neck: DC 07/25/15.   Procedures and diagnostic studies:   Dg Chest Port 1 View  07/27/15  CLINICAL DATA:  Persistent severe shortness of breath. Chronic respiratory failure. EXAM: PORTABLE CHEST 1 VIEW COMPARISON:  Multiple previous chest x-rays. FINDINGS: The heart remains enlarged. The endotracheal tube is been removed. The NG tube is been removed. The right IJ catheter is stable. Persistent asymmetric airspace process in the right lung. This could be asymmetric pulmonary edema or infiltrates. IMPRESSION: Removal of the ET and NG tubes. Persistent cardiac enlargement and asymmetric airspace process. Electronically Signed   By: Orlene Plum.D.  On: 07/23/2015 13:20   Dg Chest Port 1 View  07/22/2015  CLINICAL DATA:  Respiratory failure.  Intubation. EXAM: PORTABLE CHEST 1 VIEW COMPARISON:  07/21/2015. FINDINGS: Limited view of the chest obtained. Mid joint right hemi thorax not imaged. Low lung volumes. Persistent diffuse infiltrate right lung. Persistent left lower lobe infiltrate and/or atelectasis. No  pneumothorax visualized . IMPRESSION: 1.  Limited view of the chest.  Lines and tubes in stable position. 2. Low lung volumes. Persistent diffuse right lung and left lower lobe infiltrate. No interim change. 3.  Persistent cardiomegaly. Electronically Signed   By: Maisie Fus  Register   On: 07/22/2015 07:31     Medical Consultants:    PCCM  Anti-Infectives:   Anti-infectives    Start     Dose/Rate Route Frequency Ordered Stop   07/21/15 0600  vancomycin (VANCOCIN) 1,250 mg in sodium chloride 0.9 % 250 mL IVPB  Status:  Discontinued     1,250 mg 166.7 mL/hr over 90 Minutes Intravenous Every 12 hours 07/20/15 1657 07/23/15 1108   07/20/15 2100  piperacillin-tazobactam (ZOSYN) IVPB 3.375 g  Status:  Discontinued     3.375 g 12.5 mL/hr over 240 Minutes Intravenous Every 8 hours 07/20/15 1648 07/23/15 1108   07/20/15 1700  vancomycin (VANCOCIN) 2,500 mg in sodium chloride 0.9 % 500 mL IVPB     2,500 mg 250 mL/hr over 120 Minutes Intravenous  Once 07/20/15 1646 07/20/15 1920   07/20/15 1515  piperacillin-tazobactam (ZOSYN) IVPB 3.375 g     3.375 g 100 mL/hr over 30 Minutes Intravenous  Once 07/20/15 1503 07/20/15 1656      Subjective:   Maurice March feels okay today. He is tolerating his diet advancement with no nausea or vomiting. No complaints of dyspnea. No cough. Has some discomfort to his right leg/foot.  Objective:    Filed Vitals:   07/24/15 1932 07/24/15 2028 07/25/15 0441 07/25/15 0500  BP:  113/58 111/55   Pulse:  111 103   Temp:  98 F (36.7 C) 98.3 F (36.8 C)   TempSrc:  Oral Oral   Resp:  19 19   Height:  (1.905 m)     Weight: 280.777 kg (619 lb)   278.509 kg (614 lb)  SpO2:  96% 94%     Intake/Output Summary (Last 24 hours) at 07/25/15 0745 Last data filed at 07/25/15 0447  Gross per 24 hour  Intake   3560 ml  Output   3450 ml  Net    110 ml   Filed Weights   07/23/15 0443 07/24/15 1932 07/25/15 0500  Weight: 272.612 kg (601 lb) 280.777 kg (619 lb)  278.509 kg (614 lb)    Exam: Gen:  NAD, morbidly obese Cardiovascular:  HSIR, No M/R/G Respiratory:  Lungs diminished Gastrointestinal:  Abdomen soft, NT/ND, + BS Extremities: + Edema, large fluid filled hemorrhagic blister to ventral right foot, blisters with clear fluid right posterior lower leg   Data Reviewed:    Labs: Basic Metabolic Panel:  Recent Labs Lab 07/20/15 2230 07/21/15 0420  07/21/15 1826 07/22/15 0430 07/22/15 1415 07/23/15 1130 07/24/15 0512 07/25/15 0430  NA 142 143  < > 143  --  143 142 143 140  K 4.4 3.6  < > 3.5  --  3.7 3.8 3.5 3.4*  CL 102 100*  < > 98*  --  94* 94* 94* 92*  CO2 34* 36*  < > 36*  --  38* 40* 43* 39*  GLUCOSE 344*  149*  < > 193*  --  217* 246* 157* 253*  BUN 18 17  < > 15  --  16 15 18 17   CREATININE 1.21 1.15  < > 1.21  --  1.26* 1.20 1.23 1.01  CALCIUM 8.8* 9.0  < > 8.0*  --  8.3* 8.2* 8.3* 8.6*  MG 1.9 1.8  --   --  1.6*  --  1.9 1.9  --   PHOS 2.5 1.8*  --   --  3.7  --   --   --   --   < > = values in this interval not displayed. GFR Estimated Creatinine Clearance: 211.8 mL/min (by C-G formula based on Cr of 1.01). Liver Function Tests:  Recent Labs Lab 07/20/15 1404  AST 33  ALT 22  ALKPHOS 96  BILITOT 1.6*  PROT 8.4*  ALBUMIN 3.9    Recent Labs Lab 07/20/15 1404  LIPASE 21*   Coagulation profile  Recent Labs Lab 07/20/15 1404  INR 1.32    CBC:  Recent Labs Lab 07/20/15 1404  07/20/15 2230 07/21/15 0420 07/22/15 0430 07/23/15 0420 07/24/15 0512  WBC 14.6*  --  14.3* 14.3* 15.4* 13.6* 12.2*  NEUTROABS 12.5*  --  11.0*  --   --   --   --   HGB 14.7  < > 13.1 13.2 12.9* 13.4 12.9*  HCT 45.8  < > 40.5 41.5 40.5 42.8 41.2  MCV 87.1  --  85.6 85.9 86.9 87.3 88.2  PLT 307  --  315 313 278 280 293  < > = values in this interval not displayed. Cardiac Enzymes:  Recent Labs Lab 07/20/15 1404  TROPONINI 0.04*   CBG:  Recent Labs Lab 07/24/15 0455 07/24/15 0758 07/24/15 1142  07/24/15 1543 07/24/15 2026  GLUCAP 159* 154* 226* 332* 322*   Sepsis Labs:  Recent Labs Lab 07/20/15 2230 07/21/15 0420 07/22/15 0430 07/23/15 0420 07/24/15 0512  PROCALCITON 0.29 0.23 0.17  --   --   WBC 14.3* 14.3* 15.4* 13.6* 12.2*   Microbiology Recent Results (from the past 240 hour(s))  Culture, blood (routine x 2)     Status: None (Preliminary result)   Collection Time: 07/20/15 10:15 AM  Result Value Ref Range Status   Specimen Description BLOOD LEFT HAND  Final   Special Requests IN PEDIATRIC BOTTLE 3CC  Final   Culture   Final    NO GROWTH 4 DAYS Performed at [facility]    Report Status PENDING  Incomplete  Culture, blood (routine x 2)     Status: None (Preliminary result)   Collection Time: 07/20/15  2:04 PM  Result Value Ref Range Status   Specimen Description BLOOD LEFT ANTECUBITAL  Final   Special Requests BOTTLES DRAWN AEROBIC AND ANAEROBIC 5CC  Final   Culture   Final    NO GROWTH 4 DAYS Performed at [facility]    Report Status PENDING  Incomplete  MRSA PCR Screening     Status: None   Collection Time: 07/20/15 10:01 PM  Result Value Ref Range Status   MRSA by PCR NEGATIVE NEGATIVE Final    Comment:        The GeneXpert MRSA Assay (FDA approved for NASAL specimens only), is one component of a comprehensive MRSA colonization surveillance program. It is not intended to diagnose MRSA infection nor to guide or monitor treatment for MRSA infections.   Urine culture     Status: None   Collection  Time: 07/20/15 10:39 PM  Result Value Ref Range Status   Specimen Description URINE, CATHETERIZED  Final   Special Requests NONE  Final   Culture   Final    NO GROWTH 2 DAYS Performed at [facility]    Report Status 07/22/2015 FINAL  Final     Medications:   . antiseptic oral rinse  7 mL Mouth Rinse q12n4p  . chlorhexidine  15 mL Mouth Rinse BID  . diltiazem  60 mg Oral 4 times per day  . feeding supplement  1  Container Oral TID BM  . feeding supplement (PRO-STAT SUGAR FREE 64)  30 mL Oral TID BM  . furosemide  60 mg Intravenous BID  . insulin aspart  0-20 Units Subcutaneous TID WC  . insulin aspart  0-5 Units Subcutaneous QHS  . insulin glargine  35 Units Subcutaneous BID  . metoprolol tartrate  25 mg Oral BID  . nystatin   Topical TID  . potassium chloride  40 mEq Per Tube Daily  . rivaroxaban  20 mg Oral Q breakfast   Continuous Infusions:    Time spent: 25 minutes.    LOS: 5 days   RAMA,CHRISTINA  [facility] Pager [phone]. If unable to reach me by pager, please call my cell phone at [phone].  *Please refer to [email or web address], password [email or web address] to get updated schedule on who will round on this patient, as hospitalists switch teams weekly. If 7PM-7AM, please contact night-coverage at [email or web address], password [email or web address] for any overnight needs.  07/25/2015, 7:45 AM

## 2015-07-25 NOTE — Procedures (Signed)
Pt placed on BiPAP.  Pt is comfortably and tolerating well.

## 2015-07-26 ENCOUNTER — Encounter (HOSPITAL_COMMUNITY): Payer: Self-pay | Admitting: *Deleted

## 2015-07-26 DIAGNOSIS — Z7409 Other reduced mobility: Secondary | ICD-10-CM

## 2015-07-26 DIAGNOSIS — I10 Essential (primary) hypertension: Secondary | ICD-10-CM

## 2015-07-26 LAB — GLUCOSE, CAPILLARY
GLUCOSE-CAPILLARY: 228 mg/dL — AB (ref 65–99)
GLUCOSE-CAPILLARY: 262 mg/dL — AB (ref 65–99)
Glucose-Capillary: 276 mg/dL — ABNORMAL HIGH (ref 65–99)

## 2015-07-26 MED ORDER — POTASSIUM CHLORIDE CRYS ER 20 MEQ PO TBCR: 20.0000 meq | EXTENDED_RELEASE_TABLET | Freq: Three times a day (TID) | ORAL | Status: AC

## 2015-07-26 MED ORDER — INSULIN GLARGINE 100 UNIT/ML ~~LOC~~ SOLN
37.0000 [IU] | Freq: Two times a day (BID) | SUBCUTANEOUS | Status: DC
  Filled 2015-07-26: qty 0.37

## 2015-07-26 MED ORDER — PRO-STAT SUGAR FREE PO LIQD: 30.0000 mL | Freq: Three times a day (TID) | ORAL | Status: DC

## 2015-07-26 MED ORDER — ALPRAZOLAM 0.5 MG PO TABS: 0.5000 mg | ORAL_TABLET | Freq: Every evening | ORAL | Status: DC | PRN

## 2015-07-26 MED ORDER — INSULIN ASPART 100 UNIT/ML ~~LOC~~ SOLN: 8.0000 [IU] | Freq: Three times a day (TID) | SUBCUTANEOUS | Status: AC

## 2015-07-26 MED ORDER — INSULIN ASPART 100 UNIT/ML ~~LOC~~ SOLN
8.0000 [IU] | Freq: Three times a day (TID) | SUBCUTANEOUS | Status: DC
  Administered 2015-07-26: 8 [IU] via SUBCUTANEOUS

## 2015-07-26 MED ORDER — INSULIN ASPART 100 UNIT/ML ~~LOC~~ SOLN: 0.0000 [IU] | Freq: Every day | SUBCUTANEOUS | Status: AC

## 2015-07-26 MED ORDER — INSULIN ASPART 100 UNIT/ML ~~LOC~~ SOLN: 0.0000 [IU] | Freq: Three times a day (TID) | SUBCUTANEOUS | Status: AC

## 2015-07-26 MED ORDER — NYSTATIN 100000 UNIT/GM EX POWD: CUTANEOUS | Status: AC

## 2015-07-26 MED ORDER — ALBUTEROL SULFATE (2.5 MG/3ML) 0.083% IN NEBU: 2.5000 mg | INHALATION_SOLUTION | Freq: Four times a day (QID) | RESPIRATORY_TRACT | Status: AC | PRN

## 2015-07-26 MED ORDER — METOPROLOL TARTRATE 25 MG PO TABS: 25.0000 mg | ORAL_TABLET | Freq: Two times a day (BID) | ORAL | Status: AC

## 2015-07-26 MED ORDER — FUROSEMIDE 40 MG PO TABS: 80.0000 mg | ORAL_TABLET | Freq: Every day | ORAL | Status: DC

## 2015-07-26 MED ORDER — ACETAMINOPHEN 325 MG PO TABS: 650.0000 mg | ORAL_TABLET | ORAL | Status: AC | PRN

## 2015-07-26 MED ORDER — INSULIN GLARGINE 100 UNIT/ML ~~LOC~~ SOLN: 37.0000 [IU] | Freq: Two times a day (BID) | SUBCUTANEOUS | Status: AC

## 2015-07-26 NOTE — Progress Notes (Signed)
Pt requested to be taken off BiPap states "I can't take it anymore".  Education and rationale given by RT and RN tonight and pt verbalized understanding but continues to refuse.  Placed back n 3l/m [place] and cont pulse ox and is sating 93%.  Will continue to monitor.

## 2015-07-26 NOTE — Care Management Important Message (Signed)
Important Message  Patient Details  Name: [name] MRN: [id number] Date of Birth: 10/20/1970   Medicare Important Message Given:  Yes-second notification given    Haskell FlirtJamison, Elihu Milstein 07/26/2015, 12:08 PMImportant Message  Patient Details  Name: [name] MRN: [id number] Date of Birth: 10/12/1970   Medicare Important Message Given:  Yes-second notification given    Haskell FlirtJamison, Robyne Matar 07/26/2015, 12:07 PM

## 2015-07-26 NOTE — Progress Notes (Signed)
[facility] West BaliMary Anne @ [phone] ext [phone], pt will be going to room 216, receiving doctor is Johnathan HausenKilpatrick, [facility] called and transport set up for [phone]

## 2015-07-26 NOTE — Care Management Note (Signed)
Case Management Note  Patient Details  Name: [name] MRN: [id number] Date of Birth: 12/04/1970  Subjective/Objective:Transfer from SDU. Resp failure, bipap HS,woc-wounds,DM,wt-610lbs.Will check for LTACH screen.                    Action/Plan:Monitor progress for d/c plans.   Expected Discharge Date:   (unknown)               Expected Discharge Plan:  Long Term Acute Care (LTAC)  In-House Referral:     Discharge planning Services  CM Consult  Post Acute Care Choice:    Choice offered to:     DME Arranged:    DME Agency:     HH Arranged:    HH Agency:     Status of Service:  In process, will continue to follow  Medicare Important Message Given:    Date Medicare IM Given:    Medicare IM give by:    Date Additional Medicare IM Given:    Additional Medicare Important Message give by:     If discussed at Long Length of Stay Meetings, dates discussed:    Additional Comments:  Lanier ClamMahabir, Abdiel Blackerby, RN 07/26/2015, 10:52 AM

## 2015-07-26 NOTE — Discharge Instructions (Signed)
Respiratory failure is when your lungs are not working well and your breathing (respiratory) system fails. When respiratory failure occurs, it is difficult for your lungs to get enough oxygen, get rid of carbon dioxide, or both. Respiratory failure can be life threatening.  °Respiratory failure can be acute or chronic. Acute respiratory failure is sudden, severe, and requires emergency medical treatment. Chronic respiratory failure is less severe, happens over time, and requires ongoing treatment.  °WHAT ARE THE CAUSES OF ACUTE RESPIRATORY FAILURE?  °Any problem affecting the heart or lungs can cause acute respiratory failure. Some of these causes include the following: °· Chronic bronchitis and emphysema (COPD).   °· Blood clot going to a lung (pulmonary embolism).   °· Having water in the lungs caused by heart failure, lung injury, or infection (pulmonary edema).   °· Collapsed lung (pneumothorax).   °· Pneumonia.   °· Pulmonary fibrosis.   °· Obesity.   °· Asthma.   °· Heart failure.   °· Any type of trauma to the chest that can make breathing difficult.   °· Nerve or muscle diseases making chest movements difficult. °HOW WILL MY ACUTE RESPIRATORY FAILURE BE TREATED?  °Treatment of acute respiratory failure depends on the cause of the respiratory failure. Usually, you will stay in the intensive care unit so your breathing can be watched closely. Treatment can include the following: °· Oxygen. Oxygen can be delivered through the following: °¨ Nasal cannula. This is small tubing that goes in your nose to give you oxygen. °¨ Face mask. A face mask covers your nose and mouth to give you oxygen. °· Medicine. Different medicines can be given to help with breathing. These can include: °¨ Nebulizers. Nebulizers deliver medicines to open the air passages (bronchodilators). These medicines help to open or relax the airways in the lungs so you can breathe better. They can also help loosen mucus from your  lungs. °¨ Diuretics. Diuretic medicines can help you breathe better by getting rid of extra water in your body. °¨ Steroids. Steroid medicines can help decrease swelling (inflammation) in your lungs. °¨ Antibiotics. °· Chest tube. If you have a collapsed lung (pneumothorax), a chest tube is placed to help reinflate the lung. °· Noninvasive positive pressure ventilation (NPPV). This is a tight-fitting mask that goes over your nose and mouth. The mask has tubing that is attached to a machine. The machine blows air into the tubing, which helps to keep the tiny air sacs (alveoli) in your lungs open. This machine allows you to breathe on your own. °· Ventilator. A ventilator is a breathing machine. When on a ventilator, a breathing tube is put into the lungs. A ventilator is used when you can no longer breathe well enough on your own. You may have low oxygen levels or high carbon dioxide (CO2) levels in your blood. When you are on a ventilator, sedation and pain medicines are given to make you sleep so your lungs can heal. °SEEK IMMEDIATE MEDICAL CARE IF: °· You have shortness of breath (dyspnea) with or without activity. °· You have rapid breathing (tachypnea). °· You are wheezing. °· You are unable to say more than a few words without having to catch your breath. °· You find it very difficult to function normally. °· You have a fast heart rate. °· You have a bluish color to your finger or toe nail beds. °· You have confusion or drowsiness or both. °  °This information is not intended to replace advice given to you by your health care provider. Make sure you discuss   any questions you have with your health care provider. °  °Document Released: 09/23/2013 Document Revised: 06/09/2015 Document Reviewed: 09/23/2013 °Elsevier Interactive Patient Education ©2016 [employer] ° °

## 2015-07-26 NOTE — Progress Notes (Signed)
Nutrition Follow-up  DOCUMENTATION CODES:   Morbid obesity  INTERVENTION:  - Continue Carb Modified diet - Will d/c Boost Breeze  - Continue Prostat TID - RD will continue to monitor for needs  NUTRITION DIAGNOSIS:   Inadequate protein intake related to other (see comment) (current diet order) as evidenced by other (see comment) (CLD does not meet protein needs).  GOAL:   Patient will meet greater than or equal to 90% of their needs  MONITOR:   PO intake, Supplement acceptance, Weight trends, Labs, Skin, I & O's  REASON FOR ASSESSMENT:   Consult Diet education  ASSESSMENT:   44 y/o M with PMH of morbid obesity, CHF, OSH/OSA who presented to [facility] via EMS on 10/18 with complaints of shortness of breath with chest pain. Failed bipap therapy and was intubated.   10/24 Diet advanced to Carb Modified 10/22 at 948. Per chart review, pt ate 100% lunch and dinner yesterday (10/23). Pt states he ate 100% breakfast this AM which consisted of [place] sausage, yogurt, grits, eggs, and toast. He denies abdominal pain or nausea and reports good appetite.   Consult for diet education related to morbid obesity received. Pt reports he has been in the process of consideration for bariatric surgery and that he is to meet with surgeon again in November. He has met with 2 RDs at [facility] in the past for diet related to bariatric surgery and states that these meetings focused on current recommendations for diet and exercise and what to expect post-op. He states that prior to knee hematoma requiring surgery he had been walking 3 miles/day. He states RDs at [facility] had advised him on portion control, carbohydrate modified diet, and very low Na diet. He is aware of why low Na diet was recommended related to fluid retention and complications arising from that. Encouraged pt to continue per recommendations from [facility] RDs and he states he is very motivated. Provided encouragement and additional tips related to diet  recommendations previously provided.  Per chart review, pt has gained 9 lbs since 07/23/15.  Meeting kcal needs likely. Medications reviewed. Labs reviewed; CBGs: 154-332 mg/dL, K: 3.4 mmol/L, Cl: 92 mmol/L, Ca: 8.6 mg/dL.    10/21 - Pt extubated yesterday evening and diet advanced to CLD.  - Nutrition needs updated s/p extubation.  - Pt very drowsy this AM. He states had some liquids this AM but unable to state what he had.  - Pt needed to be awoken several times and unable to stay awake for more than a few seconds each time. - Will order supplements to aid in protein consumption.   10/20 - Tube feeding was started 10/19 on Vital HP.  - Vital HP now infusing @ 40 ml/hr, this is providing 960 kcal and 84g protein. - RD adjusted TF order to better meet protein and calorie needs. Unable to meet all of protein needs d/t high Propofol rate. - Patient is currently intubated on ventilator support - MV: 13.2 L/min; Propofol: 35.2 ml/hr (929 kcal)   Diet Order:  Diet Carb Modified Fluid consistency:: Thin; Room service appropriate?: Yes  Skin:  Reviewed, no issues  Last BM:  10/21  Height:   Ht Readings from Last 1 Encounters:  07/24/15 $RemoveB'6\' 3"'[id number]$  (1.905 m)    Weight:   Wt Readings from Last 1 Encounters:  07/26/15 610 lb (276.694 kg)    Ideal Body Weight:  91.8 kg  BMI:  Body mass index is 76.24 kg/(m^2).  Estimated Nutritional Needs:   Kcal:  2400-2600  Protein:  140-155 grams  Fluid:  2.5L/day   Skin: DTIs to: R foot, R thigh, R ischial tuberosity, L buttocks  EDUCATION NEEDS:   Education needs addressed     Jarome Matin, RD, LDN Inpatient Clinical Dietitian Pager # [phone] After hours/weekend pager # [phone]

## 2015-07-26 NOTE — Patient Outreach (Signed)
[facility] ([facility]) Care Management  07/26/2015  Maurice Marchaul J [facility]chs [facility]11/1970 [id number]  This RNCM was advised by Raiford NobleAtika Hall, RN/[facility] Hospital Liaison [facility].  Will contact Discharge Planner at [facility] for introduction and to request to be advised on disposition.  Plan to make call on Friday, October 28

## 2015-07-26 NOTE — Progress Notes (Signed)
Pt does not like the way the allevyn dressing feel and is taking some of them off.   Rationale for wounds, infection and healing given and pt verbalizes understanding but does not want to replace dressings.

## 2015-07-26 NOTE — Progress Notes (Signed)
Progress Note   KENNARD FILDES ZHY:[id number] DOB: 04-Jan-1971 DOA: 07/20/2015 PCP: Emeterio Reeve, MD   Brief Narrative:   [name] is an 44 y.o. male with a PMH of morbid obesity, atrial fibrillation, hypertension, diabetes, CHF with EF of 55-60 percent by echo done 05/10/15 who was admitted 07/20/15 with acute on chronic hypercapnic respiratory failure requiring intubation after failing BiPAP therapy. He self extubated 07/22/15. He was managed by the critical care team until 07/24/15 when his care was transferred to [facility]. Patient is now medically stable and his respiratory status is stable on BiPAP at at bedtime. Barrier to discharge is current immobility (lives alone).  Assessment/Plan:   Principal Problem:   Acute on chronic respiratory failure with hypercapnia (HCC) in the setting of obesity hypoventilation syndrome and obstructive sleep apnea - Intubated 07/20/15-07/22/15. Now on BiPAP when necessary at bedtime and when sleeping. - Continue supplemental oxygen. Uses 3 L of oxygen at baseline and he has been weaned to this. - Continue bronchodilators as needed. - Respiratory status stable.  Active Problems:   Immobility - Patient is unable to ambulate secondary to weakness and deconditioning in the setting of morbid obesity. - PT continues to work with the patient to get him out of bed. - We'll likely need a SNF for rehabilitation.    Blisters right ventral foot, right posterior leg / pressure sore - Wound care RN consult requested.    Thrush - Treated with nystatin.    Acute on chronic diastolic CHF - Initially managed with a Lasix drip at 5 mL/hour, currently on Lasix 80 mg daily. - Weight down approximately 30 pounds since admission.    Hypomagnesemia - Repleted.    Acute encephalopathy secondary to hypercarbic respiratory failure - Resolving.    Morbid obesity with body mass index of 60.0-69.9 in adult [facility]) - Dietary counseling requested per dietitian.  Counseled August 20, 2015.    HTN (hypertension), benign - Continue Cardizem, Lasix, metoprolol.    PAF (paroxysmal atrial fibrillation) (HCC) - Continue chronic anticoagulation with Xarelto. - Continue diltiazem and Toprol for rate control.    DM (diabetes mellitus), type 2, uncontrolled (HCC)/  Hyperosmolar non-ketotic state in patient with type 2 diabetes mellitus (HCC) - Hemoglobin A1c is 8.9%. - Currently being managed by Lantus 35 units twice a day, and resistant scale SSI with meal coverage. CBGs [id number]. - Increase Lantus to 37 units and change meal coverage to 8 units - We'll ask diabetes coordinator to see.    Possible Healthcare-associated pneumonia - Blood cultures, sputum cultures negative to date. - Vancomycin and Zosyn discontinued 08-20-15.    DVT Prophylaxis - Lovenox ordered.  Family Communication: No family at the bedside. Disposition Plan: ? LTAC versus SNF for rehabilitation.  Lives alone.  PT unable to get patient up. Likely medically stable for discharge 07/26/15 if he has a bed at a facility that can manage his care given his morbid obesity and current immobility. Code Status:     Code Status Orders        Start     Ordered   07/20/15 1628  Full code   Continuous     07/20/15 1633      IV Access:    IJ left neck: DC 07/25/15.  PIV   Procedures and diagnostic studies:   Dg Chest Port 1 View  20-Aug-2015  CLINICAL DATA:  Persistent severe shortness of breath. Chronic respiratory failure. EXAM: PORTABLE CHEST 1 VIEW COMPARISON:  Multiple previous chest x-rays.  FINDINGS: The heart remains enlarged. The endotracheal tube is been removed. The NG tube is been removed. The right IJ catheter is stable. Persistent asymmetric airspace process in the right lung. This could be asymmetric pulmonary edema or infiltrates. IMPRESSION: Removal of the ET and NG tubes. Persistent cardiac enlargement and asymmetric airspace process. Electronically Signed   By: Rudie Meyer M.D.   On: 07/23/2015 13:20     Medical Consultants:    PCCM  Anti-Infectives:   Anti-infectives    Start     Dose/Rate Route Frequency Ordered Stop   07/21/15 0600  vancomycin (VANCOCIN) 1,250 mg in sodium chloride 0.9 % 250 mL IVPB  Status:  Discontinued     1,250 mg 166.7 mL/hr over 90 Minutes Intravenous Every 12 hours 07/20/15 1657 07/23/15 1108   07/20/15 2100  piperacillin-tazobactam (ZOSYN) IVPB 3.375 g  Status:  Discontinued     3.375 g 12.5 mL/hr over 240 Minutes Intravenous Every 8 hours 07/20/15 1648 07/23/15 1108   07/20/15 1700  vancomycin (VANCOCIN) 2,500 mg in sodium chloride 0.9 % 500 mL IVPB     2,500 mg 250 mL/hr over 120 Minutes Intravenous  Once 07/20/15 1646 07/20/15 1920   07/20/15 1515  piperacillin-tazobactam (ZOSYN) IVPB 3.375 g     3.375 g 100 mL/hr over 30 Minutes Intravenous  Once 07/20/15 1503 07/20/15 1656      Subjective:   Maurice March reports that his breathing is stable. Requests Xanax at bedtime to assist with rest as the BiPAP machine makes it difficult for him to sleep. Appetite is good. No significant pain.  Objective:    Filed Vitals:   07/25/15 0500 07/25/15 1524 07/25/15 2128 07/26/15 0448  BP:  113/49 142/66 141/65  Pulse:  105 109 98  Temp:  98.3 F (36.8 C) 98.5 F (36.9 C) 99.6 F (37.6 C)  TempSrc:  Oral Oral Oral  Resp:  Height:      Weight: 278.509 kg (614 lb)   276.694 kg (610 lb)  SpO2:  94% 93% 97%    Intake/Output Summary (Last 24 hours) at 07/26/15 0812 Last data filed at 07/26/15 0502  Gross per 24 hour  Intake   3457 ml  Output   3875 ml  Net   -418 ml   Filed Weights   07/24/15 1932 07/25/15 0500 07/26/15 0448  Weight: 280.777 kg (619 lb) 278.509 kg (614 lb) 276.694 kg (610 lb)    Exam: Gen:  NAD, morbidly obese Cardiovascular:  HSIR, No M/R/G Respiratory:  Lungs diminished Gastrointestinal:  Abdomen soft, NT/ND, + BS Extremities: + Edema, large fluid filled hemorrhagic  blister to ventral right foot, blisters with clear fluid right posterior lower leg       Data Reviewed:    Labs: Basic Metabolic Panel:  Recent Labs Lab 07/20/15 2230 07/21/15 0420  07/21/15 1826 07/22/15 0430 07/22/15 1415 07/23/15 1130 07/24/15 0512 07/25/15 0430  NA 142 143  < > 143  --  143 142 143 140  K 4.4 3.6  < > 3.5  --  3.7 3.8 3.5 3.4*  CL 102 100*  < > 98*  --  94* 94* 94* 92*  CO2 34* 36*  < > 36*  --  38* 40* 43* 39*  GLUCOSE 344* 149*  < > 193*  --  217* 246* 157* 253*  BUN 18 17  < > 15  --  CREATININE 1.21 1.15  < >  1.21  --  1.26* 1.20 1.23 1.01  CALCIUM 8.8* 9.0  < > 8.0*  --  8.3* 8.2* 8.3* 8.6*  MG 1.9 1.8  --   --  1.6*  --  1.9 1.9  --   PHOS 2.5 1.8*  --   --  3.7  --   --   --   --   < > = values in this interval not displayed. GFR Estimated Creatinine Clearance: 210.8 mL/min (by C-G formula based on Cr of 1.01). Liver Function Tests:  Recent Labs Lab 07/20/15 1404  AST 33  ALT 22  ALKPHOS 96  BILITOT 1.6*  PROT 8.4*  ALBUMIN 3.9    Recent Labs Lab 07/20/15 1404  LIPASE 21*   Coagulation profile  Recent Labs Lab 07/20/15 1404  INR 1.32    CBC:  Recent Labs Lab 07/20/15 1404  07/20/15 2230 07/21/15 0420 07/22/15 0430 07/23/15 0420 07/24/15 0512  WBC 14.6*  --  14.3* 14.3* 15.4* 13.6* 12.2*  NEUTROABS 12.5*  --  11.0*  --   --   --   --   HGB 14.7  < > 13.1 13.2 12.9* 13.4 12.9*  HCT 45.8  < > 40.5 41.5 40.5 42.8 41.2  MCV 87.1  --  85.6 85.9 86.9 87.3 88.2  PLT 307  --  315 313 278 280 293  < > = values in this interval not displayed. Cardiac Enzymes:  Recent Labs Lab 07/20/15 1404  TROPONINI 0.04*   CBG:  Recent Labs Lab 07/24/15 2026 07/25/15 0816 07/25/15 1152 07/25/15 1708 07/25/15 2122  GLUCAP 322* 198* 299* 216* 234*   Sepsis Labs:  Recent Labs Lab 07/20/15 2230 07/21/15 0420 07/22/15 0430 07/23/15 0420 07/24/15 0512  PROCALCITON 0.29 0.23 0.17  --   --   WBC 14.3*  14.3* 15.4* 13.6* 12.2*   Microbiology Recent Results (from the past 240 hour(s))  Culture, blood (routine x 2)     Status: None   Collection Time: 07/20/15 10:15 AM  Result Value Ref Range Status   Specimen Description BLOOD LEFT HAND  Final   Special Requests IN PEDIATRIC BOTTLE 3CC  Final   Culture   Final    NO GROWTH 5 DAYS Performed at [facility]    Report Status 07/25/2015 FINAL  Final  Culture, blood (routine x 2)     Status: None   Collection Time: 07/20/15  2:04 PM  Result Value Ref Range Status   Specimen Description BLOOD LEFT ANTECUBITAL  Final   Special Requests BOTTLES DRAWN AEROBIC AND ANAEROBIC 5CC  Final   Culture   Final    NO GROWTH 5 DAYS Performed at [facility]    Report Status 07/25/2015 FINAL  Final  MRSA PCR Screening     Status: None   Collection Time: 07/20/15 10:01 PM  Result Value Ref Range Status   MRSA by PCR NEGATIVE NEGATIVE Final    Comment:        The GeneXpert MRSA Assay (FDA approved for NASAL specimens only), is one component of a comprehensive MRSA colonization surveillance program. It is not intended to diagnose MRSA infection nor to guide or monitor treatment for MRSA infections.   Urine culture     Status: None   Collection Time: 07/20/15 10:39 PM  Result Value Ref Range Status   Specimen Description URINE, CATHETERIZED  Final   Special Requests NONE  Final   Culture   Final    NO GROWTH  2 DAYS [facility]     Report Status 07/22/2015 FINAL  Final     Medications:   . antiseptic oral rinse  7 mL Mouth Rinse q12n4p  . chlorhexidine  15 mL Mouth Rinse BID  . diltiazem  60 mg Oral 4 times per day  . feeding supplement  1 Container Oral TID BM  . feeding supplement (PRO-STAT SUGAR FREE 64)  30 mL Oral TID BM  . furosemide  80 mg Oral Daily  . insulin aspart  0-20 Units Subcutaneous TID WC  . insulin aspart  0-5 Units Subcutaneous QHS  . insulin aspart  6 Units Subcutaneous TID WC    . insulin glargine  35 Units Subcutaneous BID  . metoprolol tartrate  25 mg Oral BID  . nystatin   Topical TID  . potassium chloride  20 mEq Oral TID  . rivaroxaban  20 mg Oral Q breakfast   Continuous Infusions:    Time spent: 25 minutes.    LOS: 6 days   Devrin Monforte  [facility] Pager [phone]. If unable to reach me by pager, please call my cell phone at [phone].  *Please refer to [email or web address], password [email or web address] to get updated schedule on who will round on this patient, as hospitalists switch teams weekly. If 7PM-7AM, please contact night-coverage at [email or web address], password [email or web address] for any overnight needs.  07/26/2015, 8:12 AM

## 2015-07-26 NOTE — Progress Notes (Signed)
SATURATION QUALIFICATIONS: (This note is used to comply with regulatory documentation for home oxygen)  Patient Saturations on Room Air at Rest = 88% after oxygen removed for 15 minutes

## 2015-07-26 NOTE — Care Management Note (Signed)
Case Management Note  Patient Details  Name: [name] MRN: [id number] Date of Birth: 06/19/1971  Subjective/Objective: 02 sats 88%ra @ rest per nsg. WOC/PT following.[facility]-spoke to Lambert Modyliason Andrea faxed w/confirmation face sheet to [phone].                   Action/Plan:   Expected Discharge Date:   (unknown)               Expected Discharge Plan:  Long Term Acute Care (LTAC)  In-House Referral:     Discharge planning Services  CM Consult  Post Acute Care Choice:    Choice offered to:     DME Arranged:    DME Agency:     HH Arranged:    HH Agency:     Status of Service:  In process, will continue to follow  Medicare Important Message Given:  Yes-second notification given Date Medicare IM Given:    Medicare IM give by:    Date Additional Medicare IM Given:    Additional Medicare Important Message give by:     If discussed at Long Length of Stay Meetings, dates discussed:    Additional Comments:  Lanier ClamMahabir, Conception Doebler, RN 07/26/2015, 1:28 PM

## 2015-07-26 NOTE — Consult Note (Signed)
WOC wound consult note Reason for Consult: Intact, fluid-filled blister on the plantar aspect of right foot, intact and ruptured blisters on right posterior thigh, right ischial tuberosity, right buttock, left buttock, left ischial tuberosity, left posterior thigh. Patient is cooperative and attempts to be mobile, but is limited by his current therapeutic sleep surface. Would benefit from continued mobilization, including OOB to chair and eventually ambulation when right foot blister is resolved. Wound type: pressure plus friction, moisture, shear.  Pressure Ulcer POA: Yes Measurements:   Plantar aspect of right foot at forefoot:  8.5cm x 12cm large, intact, fluid filled (serum) blister. Right posterior thigh: 11cm x 1.5cm intact blister in process of reabsorption Right ischial tuberosity:  8cm x 5cm area in which there are two blisters, the proximal is ruptured and measures  2cm x 3.5cm x 1cm with a red, moist base and the distal measuring 2cm x 3.5cm and presenting as an intact, reabsorbing fluid-filled (serum) blister with no visible wound bed and no drainage. Bilateral buttocks:  Intact, fluid filled (serum) blisters measuring 1cm x 3cm each Left ischial tuberosity:  4cm x 1cm intact, fluid-filled (serum) blister  Left posterior thigh 7cm x 1cm reabsorbing serum filled blister with no wound bed. Wound bed: Ruptured blisters are with red, moist wound bed, intact blisters with macerated epidermis Drainage (amount, consistency, odor) Serous drainage from ruptured blisters in small to moderate amount Periwound: intact, dry Dressing procedure/placement/frequency: Patient is on a bariatric therapeutic mattress with low air loss feature, but required a longer and wider bed to accommodate body habitus. One is ordered and will be provided today to facilitate turning and repositioning and off load the right foot that is with neuropathy. A pressure redistribution heel boot is provided for the right boot and  patient should be NWB on this extremity as the blister will rupture with weight bearing and creat a wound that is difficult to heal due to compromised perfusion. Efforts are being directed by staff and PT to mobilize for integumentary and pulmonary purposes. WOC nursing team will not follow, but will remain available to this patient, the nursing and medical teams.  Please re-consult if needed. Thanks, Ladona MowLaurie Ameliarose Shark, MSN, RN, GNP, [place] Lake[place]OCN, CWON-AP [phone])

## 2015-07-26 NOTE — Consult Note (Signed)
   [facility]   07/26/2015  Maurice Marchaul J Lewing 03/29/1971 [id number]   [facility]. Spoke with him at bedside. Discussed his discharge plans. He is very hopeful that he will get into LTAC. Patient expresses remorse about [facility] prior to admission. Spoke with inpatient RNCM about patient's case and living conditions. [facility].Marland Kitchen. Please see [facility] RNCM notes in chart review tab and patient outreach in EPIC. Will [facility] about latest developments for [facility].  Raiford NobleAtika Hall, MSN-Ed, RN,BSN [facility] Care [facility] Hospital Liaison [phone]

## 2015-07-26 NOTE — Progress Notes (Signed)
Physical Therapy Treatment Patient Details Name: [name] MRN: [id number] DOB: [birth year] Today's Date: 07/26/2015    History of Present Illness 44 y/o M with PMH of morbid obesity, CHF, OSH/OSA who presented to [facility] via EMS on 10/18 with complaints of shortness of breath with chest pain. Failed bipap therapy and was intubated.     PT Comments    Unfortunately the sky lift was not charged today to assist patient OOB. During mobility and an attempt tp to at the edg, noted HR 129, much effort by patient. Patient is assisting to turn and slide up in the bed when bed flat. WOC RN and recommends no weight on the R foot due to blister. Transfers will need to be limited most likely to mechanical presently and transition to sliding board if surfaces allow. Patient is quite motivated and appreciative. Does seem somewhat down about his current mobility status. A larger bed has been ordered by [facility] RN which will facilitate PT assisting with mobility goals.   Follow Up Recommendations  LTACH;Supervision/Assistance - 24 hour     Equipment Recommendations  None recommended by PT    Recommendations for Other Services       Precautions / Restrictions Precautions Precautions: Fall Precaution Comments: R foot blister Required Braces or Orthoses: Other Brace/Splint Other Brace/Splint: to get Prevalon for R foot Restrictions Weight Bearing Restrictions: Yes RLE Weight Bearing: Non weight bearing Other Position/Activity Restrictions: per [facility], no weight on the forefore where the large blister  exists.    Mobility  Bed Mobility Overal bed mobility: Needs Assistance;+2 for physical assistance;+ 2 for safety/equipment Bed Mobility: Rolling;Supine to Sit Rolling: Mod assist         General bed mobility comments: uses rails to  slide self to [facility] +2 assist fro rolling, assist with panus. Attempt to  sit up on the edge but the bed is actually too narrow to sit up safely. Paient's panus falls  over the edge of the bed.Jory Sims was in to inspect skin and recommends a more appropriate bariatric bed which may facilitate mobility. and safety.  Transfers                 General transfer comment: A maxisky lift sling was obtained but  the sky lift was not charged so did not  get to assist patient to recliner.   Ambulation/Gait                 Stairs            Wheelchair Mobility    Modified Rankin (Stroke Patients Only)       Balance                                    Cognition Arousal/Alertness: Awake/alert Behavior During Therapy: WFL for tasks assessed/performed                        Exercises      General Comments        Pertinent Vitals/Pain      Home Living                      Prior Function            PT Goals (current goals can now be found in the care plan section) Progress towards PT goals: Progressing toward goals  Frequency  Min 3X/week    PT Plan Current plan remains appropriate    Co-evaluation             End of Session   Activity Tolerance: Patient tolerated treatment well Patient left: in bed;with call bell/phone within reach     Time: [id number] PT Time Calculation (min) (ACUTE ONLY): 39 min  Charges:  $Therapeutic Activity: 38-52 mins                    G Codes:      Rada HayHill, Laqueena Hinchey Elizabeth 07/26/2015, 3:25 PM Blanchard KelchKaren Jahara Dail PT [phone]

## 2015-07-26 NOTE — Care Management Note (Signed)
Case Management Note  Patient Details  Name: [name] MRN: [id number] Date of Birth: 09/21/1971  Subjective/Objective:  Patient has been accept4d to LTACH-[facility]-Patient is agreeable to go. COBRA form in shadow chart for nurse to complete.Nsg to manage transfer-await for rm/accepting MD, call [facility] for transport.                  Action/Plan:d/c LTACH   Expected Discharge Date:   (unknown)               Expected Discharge Plan:  Long Term Acute Care (LTAC)  In-House Referral:     Discharge planning Services  CM Consult  Post Acute Care Choice:    Choice offered to:     DME Arranged:    DME Agency:     HH Arranged:    HH Agency:     Status of Service:  Completed, signed off  Medicare Important Message Given:  Yes-second notification given Date Medicare IM Given:    Medicare IM give by:    Date Additional Medicare IM Given:    Additional Medicare Important Message give by:     If discussed at Long Length of Stay Meetings, dates discussed:    Additional Comments:  Lanier ClamMahabir, Girard Koontz, RN 07/26/2015, 3:29 PM

## 2015-07-26 NOTE — Discharge Summary (Signed)
Physician Discharge Summary  [name] ZOX:[id number] DOB: 10/31/1970 DOA: 07/20/2015  PCP: Emeterio ReeveWOLTERS,SHARON A, MD  Admit date: 07/20/2015 Discharge date: 07/26/2015   Recommendations for Outpatient Follow-Up:   1. The patient is being discharged to an L-TAC for further care. 2. Patient will need 3 L of oxygen continuously and BiPAP per home settings when sleeping.   Discharge Diagnosis:   Principal Problem:    Acute on chronic respiratory failure with hypercapnia (HCC) Active Problems:    Obesity hypoventilation syndrome (HCC)    Morbid obesity with body mass index of 60.0-69.9 in adult (HCC)    HTN (hypertension), benign    OSA (obstructive sleep apnea)    PAF (paroxysmal atrial fibrillation) (HCC)    DM (diabetes mellitus), type 2, uncontrolled (HCC)    Healthcare-associated pneumonia    Hyperosmolar non-ketotic state in patient with type 2 diabetes mellitus (HCC)    Acute encephalopathy    Hypomagnesemia    Diastolic CHF, acute on chronic (HCC)    Pressure ulcer with blisters on plantar aspect of right foot, right & left posterior thigh, right & left ischial tuberosity, bilateral buttocks    Immobility   Discharge disposition: L-TAC  Discharge Condition: Improved.  Diet recommendation: Low sodium, heart healthy.  Carbohydrate-modified.  Regular.  Wound care: Dressing procedure/placement/frequency: Patient is on a bariatric therapeutic mattress with low air loss feature, but required a longer and wider bed to accommodate body habitus. One is ordered and will be provided today to facilitate turning and repositioning and off load the right foot that is with neuropathy. A pressure redistribution heel boot is provided for the right boot and patient should be NWB on this extremity as the blister will rupture with weight bearing and create a wound that is difficult to heal due to compromised perfusion. Efforts are being directed by staff and PT to mobilize for  integumentary and pulmonary purposes.   History of Present Illness:   [name] is an [age] y.o. male with a PMH of morbid obesity, atrial fibrillation, hypertension, diabetes, CHF with EF of 55-60 percent by echo done 05/10/15 who was admitted 07/20/15 with acute on chronic hypercapnic respiratory failure requiring intubation after failing BiPAP therapy. He self extubated 07/22/15. He was managed by the critical care team until 07/24/15 when [facility]. Patient is now medically stable and his respiratory status is stable on BiPAP at at bedtime. Barrier to discharge is current immobility (lives alone).  Hospital Course by Problem:   Principal Problem:  Acute on chronic respiratory failure with hypercapnia (HCC) in the setting of obesity hypoventilation syndrome and obstructive sleep apnea - Intubated 07/20/15-07/22/15. Now on BiPAP when necessary at bedtime and when sleeping. - Continue supplemental oxygen. Uses 3 L of oxygen at baseline and he has been weaned to this. - Continue bronchodilators as needed. - Respiratory status stable.  Active Problems:  Immobility - Patient is unable to ambulate secondary to weakness and deconditioning in the setting of morbid obesity. - PT continues to work with the patient to get him out of bed. - We'll likely need a SNF for rehabilitation.   Blisters right ventral foot, right posterior leg / pressure sore - Seen and evaluated by wound care nurse with wound care recommendations as outlined above.   Thrush - Treated with nystatin.   Acute on chronic diastolic CHF - Initially managed with a Lasix drip at 5 mL/hour, currently on Lasix 80 mg daily. - Weight down approximately 30  pounds since admission.   Hypomagnesemia - Repleted.   Acute encephalopathy secondary to hypercarbic respiratory failure - Resolving.   Morbid obesity with body mass index of 60.0-69.9 in adult [facility]) - Dietary counseling requested per dietitian.  Counseled 07/23/15.   HTN (hypertension), benign - Continue Cardizem, Lasix, metoprolol.   PAF (paroxysmal atrial fibrillation) (HCC) - Continue chronic anticoagulation with Xarelto. - Continue diltiazem and Toprol for rate control.   DM (diabetes mellitus), type 2, uncontrolled (HCC)/ Hyperosmolar non-ketotic state in patient with type 2 diabetes mellitus (HCC) - Hemoglobin A1c is 8.9%. - Currently being managed by Lantus 35 units twice a day, and resistant scale SSI with meal coverage. CBGs [id number]. - Increase Lantus to 37 units and change meal coverage to 8 units - Recommend ongoing up titration of insulin to maintain blood glucoses less than 180. Can titrate Lantus to 40 twice a day and meal coverage to 10 units over the next 24 hours.   Possible Healthcare-associated pneumonia - Blood cultures, sputum cultures negative to date. - Vancomycin and Zosyn discontinued 07/23/15.   Medical Consultants:    Critical care team   Discharge Exam:   Filed Vitals:   07/26/15 1036  BP: 133/68  Pulse: 107  Temp:   Resp:    Filed Vitals:   07/25/15 2128 07/26/15 0448 07/26/15 0950 07/26/15 1036  BP: 142/66 141/65 133/68 133/68  Pulse: 109 98 107 107  Temp: 98.5 F (36.9 C) 99.6 F (37.6 C) 97.9 F (36.6 C)   TempSrc: Oral Oral Oral   Resp: 20 20    Height:      Weight:  276.694 kg (610 lb)    SpO2: 93% 97% 95%     Gen:  NAD, morbidly obese Cardiovascular:  RRR, No M/R/G Respiratory: Lungs CTAB Gastrointestinal: Abdomen soft, NT/ND with normal active bowel sounds. Extremities: Blisters to bilateral lower extremity and right plantar aspect of foot   The results of significant diagnostics from this hospitalization (including imaging, microbiology, ancillary and laboratory) are listed below for reference.     Procedures and Diagnostic Studies:   Dg Chest Port 1 View  08/15/15  CLINICAL DATA:  Acute onset of respiratory failure. Initial encounter. EXAM: PORTABLE  CHEST 1 VIEW COMPARISON:  Chest radiograph performed 07/20/2015 FINDINGS: The patient's endotracheal tube is seen ending 3-4 cm above the carina. A right IJ line is noted ending about the mid SVC. An enteric tube is noted extending below the diaphragm. A small to moderate right-sided pleural effusion is noted. Diffuse right-sided airspace opacification and mild left basilar airspace opacity may reflect asymmetric pulmonary edema or pneumonia. ARDS cannot be excluded. No pneumothorax is seen. The cardiomediastinal silhouette is borderline normal in size. No acute osseous abnormalities are seen. IMPRESSION: 1. Endotracheal tube seen ending 3-4 cm above the carina. 2. Small to moderate right-sided pleural effusion noted. Right-sided airspace opacification and mild left basilar airspace opacity may reflect asymmetric pulmonary edema or pneumonia. ARDS cannot be excluded. Electronically Signed   By: Roanna Raider M.D.   On: 2015/08/15 05:44   Dg Chest Portable 1 View  07/20/2015  CLINICAL DATA:  Respiratory distress. EXAM: PORTABLE CHEST 1 VIEW COMPARISON:  Same day. FINDINGS: Stable cardiomegaly. Evaluation of lung bases is limited due to body habitus. Stable bilateral lung opacities are noted as described on prior exam. Right internal jugular catheter is noted with distal tip in the expected position of the right innominate vein. No pneumothorax is noted. Endotracheal tube is seen projected over the tracheal  air shadow with distal tip 8 cm above the carina. IMPRESSION: Limited examination due to body habitus. Stable right lung opacities with right greater than left. Endotracheal tube is in grossly good position. Right internal jugular catheter is noted with distal tip in expected position of right innominate vein. Electronically Signed   By: Lupita Raider, M.D.   On: 07/20/2015 16:41   Dg Chest Port 1 View  07/20/2015  CLINICAL DATA:  Shortness of breath. EXAM: PORTABLE CHEST 1 VIEW COMPARISON:  May 19, 2015. FINDINGS: Stable cardiomegaly. No pneumothorax is noted. Central pulmonary vascular congestion is noted. Increased bilateral lung opacities are noted concerning for edema or possibly pneumonia, with right greater than left. Lung bases are not well visualized due to body habitus. Visualized portion of bony thorax is intact. IMPRESSION: Central pulmonary vascular congestion is noted with bilateral lung opacities concerning for edema or pneumonia. Images are limited due to body habitus. Electronically Signed   By: Lupita Raider, M.D.   On: 07/20/2015 14:02     Labs:   Basic Metabolic Panel:  Recent Labs Lab 07/20/15 2230 07/21/15 0420  07/21/15 1826 07/22/15 0430 07/22/15 1415 07/23/15 1130 07/24/15 0512 07/25/15 0430  NA 142 143  < > 143  --  143 142 143 140  K 4.4 3.6  < > 3.5  --  3.7 3.8 3.5 3.4*  CL 102 100*  < > 98*  --  94* 94* 94* 92*  CO2 34* 36*  < > 36*  --  38* 40* 43* 39*  GLUCOSE 344* 149*  < > 193*  --  217* 246* 157* 253*  BUN 18 17  < > 15  --  16 15 18 17   CREATININE 1.21 1.15  < > 1.21  --  1.26* 1.20 1.23 1.01  CALCIUM 8.8* 9.0  < > 8.0*  --  8.3* 8.2* 8.3* 8.6*  MG 1.9 1.8  --   --  1.6*  --  1.9 1.9  --   PHOS 2.5 1.8*  --   --  3.7  --   --   --   --   < > = values in this interval not displayed. GFR Estimated Creatinine Clearance: 210.8 mL/min (by C-G formula based on Cr of 1.01). Liver Function Tests:  Recent Labs Lab 07/20/15 1404  AST 33  ALT 22  ALKPHOS 96  BILITOT 1.6*  PROT 8.4*  ALBUMIN 3.9    Recent Labs Lab 07/20/15 1404  LIPASE 21*   Coagulation profile  Recent Labs Lab 07/20/15 1404  INR 1.32    CBC:  Recent Labs Lab 07/20/15 1404  07/20/15 2230 07/21/15 0420 07/22/15 0430 07/23/15 0420 07/24/15 0512  WBC 14.6*  --  14.3* 14.3* 15.4* 13.6* 12.2*  NEUTROABS 12.5*  --  11.0*  --   --   --   --   HGB 14.7  < > 13.1 13.2 12.9* 13.4 12.9*  HCT 45.8  < > 40.5 41.5 40.5 42.8 41.2  MCV 87.1  --  85.6 85.9 86.9 87.3  88.2  PLT 307  --  315 313 278 280 293  < > = values in this interval not displayed. Cardiac Enzymes:  Recent Labs Lab 07/20/15 1404  TROPONINI 0.04*   BNP: Invalid input(s): POCBNP CBG:  Recent Labs Lab 07/25/15 1152 07/25/15 1708 07/25/15 2122 07/26/15 0741 07/26/15 1142  GLUCAP 299* 216* 234* 228* 262*   Microbiology Recent Results (from the past 240 hour(s))  Culture, blood (routine x 2)     Status: None   Collection Time: 07/20/15 10:15 AM  Result Value Ref Range Status   Specimen Description BLOOD LEFT HAND  Final   Special Requests IN PEDIATRIC BOTTLE 3CC  Final   Culture   Final    NO GROWTH 5 DAYS Performed at [facility]    Report Status 07/25/2015 FINAL  Final  Culture, blood (routine x 2)     Status: None   Collection Time: 07/20/15  2:04 PM  Result Value Ref Range Status   Specimen Description BLOOD LEFT ANTECUBITAL  Final   Special Requests BOTTLES DRAWN AEROBIC AND ANAEROBIC 5CC  Final   Culture   Final    NO GROWTH 5 DAYS Performed at [facility]    Report Status 07/25/2015 FINAL  Final  MRSA PCR Screening     Status: None   Collection Time: 07/20/15 10:01 PM  Result Value Ref Range Status   MRSA by PCR NEGATIVE NEGATIVE Final    Comment:        The GeneXpert MRSA Assay (FDA approved for NASAL specimens only), is one component of a comprehensive MRSA colonization surveillance program. It is not intended to diagnose MRSA infection nor to guide or monitor treatment for MRSA infections.   Urine culture     Status: None   Collection Time: 07/20/15 10:39 PM  Result Value Ref Range Status   Specimen Description URINE, CATHETERIZED  Final   Special Requests NONE  Final   Culture   Final    NO GROWTH 2 DAYS Performed at [facility]    Report Status 07/22/2015 FINAL  Final     Discharge Instructions:   Discharge Instructions    Call MD for:  difficulty breathing, headache or visual disturbances    Complete by:   As directed      Call MD for:  extreme fatigue    Complete by:  As directed      Call MD for:  temperature >100.4    Complete by:  As directed      Diet - low sodium heart healthy    Complete by:  As directed      Diet general    Complete by:  As directed      Increase activity slowly    Complete by:  As directed      Walk with assistance    Complete by:  As directed             Medication List    STOP taking these medications        cyclobenzaprine 10 MG tablet  Commonly known as:  FLEXERIL     hydrALAZINE 10 MG tablet  Commonly known as:  APRESOLINE     insulin lispro protamine-lispro (75-25) 100 UNIT/ML Susp injection  Commonly known as:  HUMALOG 75/25 MIX     metoprolol succinate 50 MG 24 hr tablet  Commonly known as:  TOPROL-XL     oxyCODONE-acetaminophen 5-325 MG tablet  Commonly known as:  PERCOCET     ramelteon 8 MG tablet  Commonly known as:  ROZEREM     senna 8.6 MG Tabs tablet  Commonly known as:  SENOKOT      TAKE these medications        acetaminophen 325 MG tablet  Commonly known as:  TYLENOL  Take 2 tablets (650 mg total) by mouth every 4 (four) hours as needed for mild pain (temp >  101.5).     albuterol 108 (90 BASE) MCG/ACT inhaler  Commonly known as:  VENTOLIN HFA  Inhale 2 puffs into the lungs every 6 (six) hours as needed. Shortness of breath     albuterol (2.5 MG/3ML) 0.083% nebulizer solution  Commonly known as:  PROVENTIL  Take 3 mLs (2.5 mg total) by nebulization every 6 (six) hours as needed for wheezing.     ALPRAZolam 0.5 MG tablet  Commonly known as:  XANAX  Take 1 tablet (0.5 mg total) by mouth daily as needed for anxiety.     amiodarone 200 MG tablet  Commonly known as:  PACERONE  Take 200 mg by mouth daily.     amLODipine 10 MG tablet  Commonly known as:  NORVASC  Take 10 mg by mouth daily.     buPROPion 300 MG 24 hr tablet  Commonly known as:  WELLBUTRIN XL  Take 300 mg by mouth Daily.     diltiazem 240 MG 24 hr  capsule  Commonly known as:  DILACOR XR  Take 240 mg by mouth daily.     divalproex 500 MG DR tablet  Commonly known as:  DEPAKOTE  Take 1,000 mg by mouth daily.     feeding supplement (PRO-STAT SUGAR FREE 64) Liqd  Take 30 mLs by mouth 3 (three) times daily between meals.     furosemide 40 MG tablet  Commonly known as:  LASIX  Take 2 tablets (80 mg total) by mouth daily.     insulin aspart 100 UNIT/ML injection  Commonly known as:  novoLOG  Inject 0-20 Units into the skin 3 (three) times daily with meals.     insulin aspart 100 UNIT/ML injection  Commonly known as:  novoLOG  Inject 0-5 Units into the skin at bedtime.     insulin aspart 100 UNIT/ML injection  Commonly known as:  novoLOG  Inject 8 Units into the skin 3 (three) times daily with meals.     insulin glargine 100 UNIT/ML injection  Commonly known as:  LANTUS  Inject 0.37 mLs (37 Units total) into the skin 2 (two) times daily.     lamoTRIgine 25 MG tablet  Commonly known as:  LAMICTAL  Take 25 mg by mouth at bedtime.     loratadine 10 MG tablet  Commonly known as:  CLARITIN  Take 10 mg by mouth daily as needed for allergies.     metoprolol tartrate 25 MG tablet  Commonly known as:  LOPRESSOR  Take 1 tablet (25 mg total) by mouth 2 (two) times daily.     NAFTIN 2 % Crea  Generic drug:  Naftifine HCl  Apply 1 application topically daily. Apply to feet.     nitroGLYCERIN 0.4 MG SL tablet  Commonly known as:  NITROSTAT  Place 0.4 mg under the tongue every 5 (five) minutes as needed for chest pain.     nystatin 100000 UNIT/GM Powd  Apply to affected area TID     polyethylene glycol packet  Commonly known as:  MIRALAX / GLYCOLAX  Take 17 g by mouth daily as needed (constipation).     potassium chloride SA 20 MEQ tablet  Commonly known as:  K-DUR,KLOR-CON  Take 1 tablet (20 mEq total) by mouth 3 (three) times daily.     rivaroxaban 20 MG Tabs tablet  Commonly known as:  XARELTO  Take 1 tablet (20 mg  total) by mouth daily with supper.          Time coordinating  discharge: 35 minutes.  Signed:  Kairyn Olmeda  Pager [phone] [facility] 07/26/2015, 2:51 PM

## 2015-07-26 NOTE — Progress Notes (Signed)
Patient is to be picked up by [facility], report has been called and all D/C paper work complete. Will continue to monitor.

## 2015-07-26 NOTE — Progress Notes (Signed)
Inpatient Diabetes Program Recommendations  AACE/ADA: New Consensus Statement on Inpatient Glycemic Control (2015)  Target Ranges:  Prepandial:   less than 140 mg/dL      Peak postprandial:   less than 180 mg/dL (1-2 hours)      Critically ill patients:  140 - 180 mg/dL   Review of Glycemic Control  Results for [name], Filipe J (MRN [id number]) as of 07/26/2015 09:53  Ref. Range 07/25/2015 11:52 07/25/2015 17:08 07/25/2015 21:22 07/26/2015 07:41  Glucose-Capillary Latest Ref Range: 65-99 mg/dL 562299 (H) 130216 (H) 865234 (H) 228 (H)  Results for [name], Kalen J (MRN [id number]) as of 07/26/2015 09:53  Ref. Range 07/25/2015 04:30  Glucose Latest Ref Range: 65-99 mg/dL 284253 (H)    Needs further titration of insulin.  Inpatient Diabetes Program Recommendations:  Insulin - Basal: Increase Lantus to 40 units bid Insulin - Meal Coverage: Increase Novolog to 10 units tidwc for meal coverage insulin HgbA1C: 10.8% - uncontrolled. Needs adjustment of diabetes meds at discharge  Will continue to follow. Thank you. Ailene Ardshonda Jonathon Castelo, RD, LDN, CDE Inpatient Diabetes Coordinator [phone]

## 2015-07-27 ENCOUNTER — Ambulatory Visit: Payer: Medicare Other | Admitting: Dietician

## 2015-07-27 NOTE — Patient Outreach (Signed)
[facility] Care Management  07/27/2015  Maurice Marchaul J Shough 09/18/1971 [id number]   Notification from Emilia BeckPamela Faulkner, RN to close case due to [facility].  Thanks, Corrie MckusickLisa O. Sharlee BlewMoore, AABA [facility] Care Management [facility] CM Assistant Phone: [phone] Fax: [phone]

## 2015-10-14 ENCOUNTER — Other Ambulatory Visit: Payer: Self-pay

## 2016-03-14 ENCOUNTER — Ambulatory Visit: Payer: Medicare Other | Admitting: Dietician

## 2016-03-14 ENCOUNTER — Ambulatory Visit: Payer: Medicare Other | Admitting: Licensed Clinical Social Worker

## 2016-03-22 ENCOUNTER — Ambulatory Visit (INDEPENDENT_AMBULATORY_CARE_PROVIDER_SITE_OTHER): Payer: Medicare Other | Admitting: Licensed Clinical Social Worker

## 2016-03-22 DIAGNOSIS — F340 Cyclothymic disorder: Secondary | ICD-10-CM

## 2016-03-29 ENCOUNTER — Emergency Department (HOSPITAL_COMMUNITY): Payer: Medicare Other

## 2016-03-29 ENCOUNTER — Encounter (HOSPITAL_COMMUNITY): Payer: Self-pay | Admitting: Emergency Medicine

## 2016-03-29 ENCOUNTER — Inpatient Hospital Stay (HOSPITAL_COMMUNITY)
Admission: EM | Admit: 2016-03-29 | Discharge: 2016-04-03 | DRG: 871 | Disposition: A | Discharge status: Home Health | Payer: Medicare Other | Attending: Internal Medicine | Admitting: Internal Medicine

## 2016-03-29 DIAGNOSIS — G4733 Obstructive sleep apnea (adult) (pediatric): Secondary | ICD-10-CM | POA: Diagnosis present

## 2016-03-29 DIAGNOSIS — I130 Hypertensive heart and chronic kidney disease with heart failure and stage 1 through stage 4 chronic kidney disease, or unspecified chronic kidney disease: Secondary | ICD-10-CM | POA: Diagnosis present

## 2016-03-29 DIAGNOSIS — J45909 Unspecified asthma, uncomplicated: Secondary | ICD-10-CM | POA: Diagnosis present

## 2016-03-29 DIAGNOSIS — Z6841 Body Mass Index (BMI) 40.0 and over, adult: Secondary | ICD-10-CM

## 2016-03-29 DIAGNOSIS — E1122 Type 2 diabetes mellitus with diabetic chronic kidney disease: Secondary | ICD-10-CM | POA: Diagnosis present

## 2016-03-29 DIAGNOSIS — N189 Chronic kidney disease, unspecified: Secondary | ICD-10-CM | POA: Diagnosis present

## 2016-03-29 DIAGNOSIS — E785 Hyperlipidemia, unspecified: Secondary | ICD-10-CM | POA: Diagnosis present

## 2016-03-29 DIAGNOSIS — E872 Acidosis: Secondary | ICD-10-CM | POA: Diagnosis not present

## 2016-03-29 DIAGNOSIS — I481 Persistent atrial fibrillation: Secondary | ICD-10-CM | POA: Diagnosis not present

## 2016-03-29 DIAGNOSIS — A419 Sepsis, unspecified organism: Secondary | ICD-10-CM | POA: Diagnosis not present

## 2016-03-29 DIAGNOSIS — J189 Pneumonia, unspecified organism: Secondary | ICD-10-CM | POA: Diagnosis present

## 2016-03-29 DIAGNOSIS — I10 Essential (primary) hypertension: Secondary | ICD-10-CM | POA: Diagnosis present

## 2016-03-29 DIAGNOSIS — I4819 Other persistent atrial fibrillation: Secondary | ICD-10-CM

## 2016-03-29 DIAGNOSIS — F418 Other specified anxiety disorders: Secondary | ICD-10-CM | POA: Diagnosis present

## 2016-03-29 DIAGNOSIS — R0789 Other chest pain: Secondary | ICD-10-CM | POA: Diagnosis not present

## 2016-03-29 DIAGNOSIS — R079 Chest pain, unspecified: Secondary | ICD-10-CM | POA: Diagnosis not present

## 2016-03-29 DIAGNOSIS — I5033 Acute on chronic diastolic (congestive) heart failure: Secondary | ICD-10-CM | POA: Diagnosis present

## 2016-03-29 DIAGNOSIS — E860 Dehydration: Secondary | ICD-10-CM | POA: Diagnosis present

## 2016-03-29 DIAGNOSIS — R0602 Shortness of breath: Secondary | ICD-10-CM

## 2016-03-29 DIAGNOSIS — Z9981 Dependence on supplemental oxygen: Secondary | ICD-10-CM | POA: Diagnosis not present

## 2016-03-29 DIAGNOSIS — Z7901 Long term (current) use of anticoagulants: Secondary | ICD-10-CM

## 2016-03-29 DIAGNOSIS — Z7951 Long term (current) use of inhaled steroids: Secondary | ICD-10-CM | POA: Diagnosis not present

## 2016-03-29 DIAGNOSIS — J45901 Unspecified asthma with (acute) exacerbation: Secondary | ICD-10-CM | POA: Diagnosis not present

## 2016-03-29 DIAGNOSIS — Z79899 Other long term (current) drug therapy: Secondary | ICD-10-CM | POA: Diagnosis not present

## 2016-03-29 DIAGNOSIS — E70319 Ocular albinism, unspecified: Secondary | ICD-10-CM | POA: Diagnosis present

## 2016-03-29 DIAGNOSIS — R000 Tachycardia, unspecified: Secondary | ICD-10-CM | POA: Diagnosis present

## 2016-03-29 DIAGNOSIS — J9622 Acute and chronic respiratory failure with hypercapnia: Secondary | ICD-10-CM | POA: Diagnosis not present

## 2016-03-29 DIAGNOSIS — Z8249 Family history of ischemic heart disease and other diseases of the circulatory system: Secondary | ICD-10-CM | POA: Diagnosis not present

## 2016-03-29 DIAGNOSIS — G8929 Other chronic pain: Secondary | ICD-10-CM | POA: Diagnosis present

## 2016-03-29 DIAGNOSIS — I248 Other forms of acute ischemic heart disease: Secondary | ICD-10-CM | POA: Diagnosis present

## 2016-03-29 DIAGNOSIS — I483 Typical atrial flutter: Secondary | ICD-10-CM

## 2016-03-29 DIAGNOSIS — E875 Hyperkalemia: Secondary | ICD-10-CM

## 2016-03-29 DIAGNOSIS — I4892 Unspecified atrial flutter: Secondary | ICD-10-CM | POA: Diagnosis present

## 2016-03-29 DIAGNOSIS — E662 Morbid (severe) obesity with alveolar hypoventilation: Secondary | ICD-10-CM | POA: Diagnosis present

## 2016-03-29 DIAGNOSIS — N179 Acute kidney failure, unspecified: Secondary | ICD-10-CM | POA: Insufficient documentation

## 2016-03-29 DIAGNOSIS — H548 Legal blindness, as defined in USA: Secondary | ICD-10-CM | POA: Diagnosis present

## 2016-03-29 DIAGNOSIS — N183 Chronic kidney disease, stage 3 (moderate): Secondary | ICD-10-CM | POA: Diagnosis not present

## 2016-03-29 DIAGNOSIS — E1165 Type 2 diabetes mellitus with hyperglycemia: Secondary | ICD-10-CM | POA: Diagnosis present

## 2016-03-29 DIAGNOSIS — J181 Lobar pneumonia, unspecified organism: Secondary | ICD-10-CM

## 2016-03-29 DIAGNOSIS — Z79891 Long term (current) use of opiate analgesic: Secondary | ICD-10-CM

## 2016-03-29 DIAGNOSIS — IMO0002 Reserved for concepts with insufficient information to code with codable children: Secondary | ICD-10-CM | POA: Diagnosis present

## 2016-03-29 DIAGNOSIS — Z09 Encounter for follow-up examination after completed treatment for conditions other than malignant neoplasm: Secondary | ICD-10-CM

## 2016-03-29 DIAGNOSIS — E119 Type 2 diabetes mellitus without complications: Secondary | ICD-10-CM

## 2016-03-29 DIAGNOSIS — R778 Other specified abnormalities of plasma proteins: Secondary | ICD-10-CM | POA: Diagnosis present

## 2016-03-29 DIAGNOSIS — D72829 Elevated white blood cell count, unspecified: Secondary | ICD-10-CM

## 2016-03-29 DIAGNOSIS — R7989 Other specified abnormal findings of blood chemistry: Secondary | ICD-10-CM | POA: Diagnosis present

## 2016-03-29 DIAGNOSIS — T380X5A Adverse effect of glucocorticoids and synthetic analogues, initial encounter: Secondary | ICD-10-CM | POA: Diagnosis present

## 2016-03-29 DIAGNOSIS — J9621 Acute and chronic respiratory failure with hypoxia: Secondary | ICD-10-CM | POA: Diagnosis present

## 2016-03-29 DIAGNOSIS — M545 Low back pain: Secondary | ICD-10-CM | POA: Diagnosis present

## 2016-03-29 DIAGNOSIS — Z9049 Acquired absence of other specified parts of digestive tract: Secondary | ICD-10-CM

## 2016-03-29 HISTORY — DX: Chronic kidney disease, stage 2 (mild): N18.2

## 2016-03-29 LAB — I-STAT CG4 LACTIC ACID, ED
LACTIC ACID, VENOUS: 1.81 mmol/L (ref 0.5–1.9)
Lactic Acid, Venous: 2.21 mmol/L (ref 0.5–1.9)

## 2016-03-29 LAB — CBC WITH DIFFERENTIAL/PLATELET
BASOS ABS: 0 10*3/uL (ref 0.0–0.1)
BASOS ABS: 0 10*3/uL (ref 0.0–0.1)
BASOS PCT: 0 %
BASOS PCT: 0 %
EOS ABS: 0 10*3/uL (ref 0.0–0.7)
EOS PCT: 0 %
EOS PCT: 0 %
Eosinophils Absolute: 0 10*3/uL (ref 0.0–0.7)
HCT: 36.7 % — ABNORMAL LOW (ref 39.0–52.0)
HCT: 36 % — ABNORMAL LOW (ref 39.0–52.0)
HEMOGLOBIN: 12.1 g/dL — AB (ref 13.0–17.0)
Hemoglobin: 12 g/dL — ABNORMAL LOW (ref 13.0–17.0)
LYMPHS ABS: 2.1 10*3/uL (ref 0.7–4.0)
LYMPHS PCT: 7 %
Lymphocytes Relative: 13 %
Lymphs Abs: 1 10*3/uL (ref 0.7–4.0)
MCH: 27.9 pg (ref 26.0–34.0)
MCH: 28.1 pg (ref 26.0–34.0)
MCHC: 33 g/dL (ref 30.0–36.0)
MCHC: 33.3 g/dL (ref 30.0–36.0)
MCV: 84.6 fL (ref 78.0–100.0)
MCV: 84.3 fL (ref 78.0–100.0)
MONO ABS: 0.6 10*3/uL (ref 0.1–1.0)
Monocytes Absolute: 1.3 10*3/uL — ABNORMAL HIGH (ref 0.1–1.0)
Monocytes Relative: 9 %
Monocytes Relative: 4 %
NEUTROS ABS: 13.4 10*3/uL — AB (ref 1.7–7.7)
NEUTROS PCT: 78 %
Neutro Abs: 12.3 10*3/uL — ABNORMAL HIGH (ref 1.7–7.7)
Neutrophils Relative %: 89 %
PLATELETS: 191 10*3/uL (ref 150–400)
PLATELETS: 194 10*3/uL (ref 150–400)
RBC: 4.34 MIL/uL (ref 4.22–5.81)
RBC: 4.27 MIL/uL (ref 4.22–5.81)
RDW: 15.7 % — ABNORMAL HIGH (ref 11.5–15.5)
RDW: 15.9 % — AB (ref 11.5–15.5)
WBC: 15.8 10*3/uL — AB (ref 4.0–10.5)
WBC: 15.1 10*3/uL — AB (ref 4.0–10.5)

## 2016-03-29 LAB — RAPID URINE DRUG SCREEN, HOSP PERFORMED
Amphetamines: NOT DETECTED
Barbiturates: NOT DETECTED
Benzodiazepines: NOT DETECTED
Cocaine: NOT DETECTED
Opiates: NOT DETECTED
Tetrahydrocannabinol: NOT DETECTED

## 2016-03-29 LAB — COMPREHENSIVE METABOLIC PANEL
ALBUMIN: 3.1 g/dL — AB (ref 3.5–5.0)
ALBUMIN: 3 g/dL — AB (ref 3.5–5.0)
ALK PHOS: 51 U/L (ref 38–126)
ALT: 12 U/L — AB (ref 17–63)
ALT: 13 U/L — AB (ref 17–63)
AST: 20 U/L (ref 15–41)
AST: 20 U/L (ref 15–41)
Alkaline Phosphatase: 54 U/L (ref 38–126)
Anion gap: 7 (ref 5–15)
Anion gap: 7 (ref 5–15)
BUN: 32 mg/dL — ABNORMAL HIGH (ref 6–20)
BUN: 32 mg/dL — AB (ref 6–20)
CALCIUM: 8.2 mg/dL — AB (ref 8.9–10.3)
CHLORIDE: 106 mmol/L (ref 101–111)
CHLORIDE: 105 mmol/L (ref 101–111)
CO2: 25 mmol/L (ref 22–32)
CO2: 26 mmol/L (ref 22–32)
CREATININE: 1.65 mg/dL — AB (ref 0.61–1.24)
Calcium: 8.3 mg/dL — ABNORMAL LOW (ref 8.9–10.3)
Creatinine, Ser: 1.51 mg/dL — ABNORMAL HIGH (ref 0.61–1.24)
GFR calc Af Amer: >60 mL/min (ref >60)
GFR calc non Af Amer: 49 mL/min — ABNORMAL LOW (ref >60)
GFR calc non Af Amer: 54 mL/min — ABNORMAL LOW (ref >60)
GFR, EST AFRICAN AMERICAN: 56 mL/min — AB (ref >60)
GLUCOSE: 303 mg/dL — AB (ref 65–99)
Glucose, Bld: 301 mg/dL — ABNORMAL HIGH (ref 65–99)
POTASSIUM: 4.6 mmol/L (ref 3.5–5.1)
Potassium: 4.3 mmol/L (ref 3.5–5.1)
SODIUM: 138 mmol/L (ref 135–145)
SODIUM: 138 mmol/L (ref 135–145)
TOTAL PROTEIN: 7.4 g/dL (ref 6.5–8.1)
Total Bilirubin: 1.6 mg/dL — ABNORMAL HIGH (ref 0.3–1.2)
Total Bilirubin: 1.5 mg/dL — ABNORMAL HIGH (ref 0.3–1.2)
Total Protein: 7.2 g/dL (ref 6.5–8.1)

## 2016-03-29 LAB — URINE MICROSCOPIC-ADD ON

## 2016-03-29 LAB — BLOOD GAS, ARTERIAL
Acid-base deficit: 0.1 mmol/L (ref 0.0–2.0)
Bicarbonate: 26.3 meq/L — ABNORMAL HIGH (ref 20.0–24.0)
Drawn by: 422461
O2 Content: 6 L/min
O2 Saturation: 81.7 %
Patient temperature: 98.4
TCO2: 24 mmol/L (ref 0–100)
pCO2 arterial: 52.5 mmHg — ABNORMAL HIGH (ref 35.0–45.0)
pH, Arterial: 7.32 — ABNORMAL LOW (ref 7.350–7.450)
pO2, Arterial: 52.9 mmHg — ABNORMAL LOW (ref 80.0–100.0)

## 2016-03-29 LAB — URINALYSIS, ROUTINE W REFLEX MICROSCOPIC
Glucose, UA: NEGATIVE mg/dL
Hgb urine dipstick: NEGATIVE
KETONES UR: NEGATIVE mg/dL
Nitrite: NEGATIVE
PH: 5 (ref 5.0–8.0)
PROTEIN: 30 mg/dL — AB
Specific Gravity, Urine: 1.017 (ref 1.005–1.030)

## 2016-03-29 LAB — LACTIC ACID, PLASMA: LACTIC ACID, VENOUS: 1.2 mmol/L (ref 0.5–1.9)

## 2016-03-29 LAB — I-STAT TROPONIN, ED: Troponin i, poc: 0.1 ng/mL (ref 0.00–0.08)

## 2016-03-29 LAB — TROPONIN I: Troponin I: 0.11 ng/mL

## 2016-03-29 LAB — BRAIN NATRIURETIC PEPTIDE: B NATRIURETIC PEPTIDE 5: 540.2 pg/mL — AB (ref 0.0–100.0)

## 2016-03-29 LAB — PROTIME-INR
INR: 1.33 (ref 0.00–1.49)
PROTHROMBIN TIME: 16.1 s — AB (ref 11.6–15.2)

## 2016-03-29 LAB — APTT: APTT: 39 s — AB (ref 24–37)

## 2016-03-29 LAB — PROCALCITONIN: Procalcitonin: 0.75 ng/mL

## 2016-03-29 LAB — MAGNESIUM: Magnesium: 1.9 mg/dL (ref 1.7–2.4)

## 2016-03-29 MED ORDER — DIGOXIN 125 MCG PO TABS
0.2500 mg | ORAL_TABLET | Freq: Every day | ORAL | Status: DC
  Filled 2016-03-29: qty 2

## 2016-03-29 MED ORDER — MORPHINE SULFATE (PF) 4 MG/ML IV SOLN: 4.0000 mg | INTRAVENOUS | Status: DC | PRN

## 2016-03-29 MED ORDER — IPRATROPIUM-ALBUTEROL 0.5-2.5 (3) MG/3ML IN SOLN
3.0000 mL | Freq: Once | RESPIRATORY_TRACT | Status: AC
  Administered 2016-03-29: 3 mL via RESPIRATORY_TRACT
  Filled 2016-03-29: qty 3

## 2016-03-29 MED ORDER — METHYLPREDNISOLONE SODIUM SUCC 125 MG IJ SOLR
125.0000 mg | Freq: Once | INTRAMUSCULAR | Status: AC
  Administered 2016-03-29: 125 mg via INTRAVENOUS
  Filled 2016-03-29: qty 2

## 2016-03-29 MED ORDER — NITROGLYCERIN 0.4 MG SL SUBL: 0.4000 mg | SUBLINGUAL_TABLET | SUBLINGUAL | Status: DC | PRN

## 2016-03-29 MED ORDER — RIVAROXABAN 20 MG PO TABS
20.0000 mg | ORAL_TABLET | Freq: Every day | ORAL | Status: DC
  Administered 2016-03-30 – 2016-04-03 (×5): 20 mg via ORAL
  Filled 2016-03-29 (×5): qty 1

## 2016-03-29 MED ORDER — OXYCODONE HCL 5 MG PO TABS
15.0000 mg | ORAL_TABLET | Freq: Four times a day (QID) | ORAL | Status: DC | PRN
  Administered 2016-03-31 – 2016-04-01 (×2): 15 mg via ORAL
  Filled 2016-03-29 (×2): qty 3

## 2016-03-29 MED ORDER — DEXTROSE 5 % IV SOLN: 1.0000 g | INTRAVENOUS | Status: DC

## 2016-03-29 MED ORDER — IPRATROPIUM BROMIDE 0.02 % IN SOLN
0.5000 mg | Freq: Three times a day (TID) | RESPIRATORY_TRACT | Status: DC
  Administered 2016-03-30 – 2016-04-03 (×10): 0.5 mg via RESPIRATORY_TRACT
  Filled 2016-03-29 (×11): qty 2.5

## 2016-03-29 MED ORDER — BUPROPION HCL ER (XL) 300 MG PO TB24
300.0000 mg | ORAL_TABLET | Freq: Every day | ORAL | Status: DC
  Filled 2016-03-29: qty 1

## 2016-03-29 MED ORDER — LORATADINE 10 MG PO TABS: 10.0000 mg | ORAL_TABLET | Freq: Every day | ORAL | Status: DC | PRN

## 2016-03-29 MED ORDER — LEVALBUTEROL HCL 1.25 MG/0.5ML IN NEBU: 1.2500 mg | INHALATION_SOLUTION | Freq: Four times a day (QID) | RESPIRATORY_TRACT | Status: DC

## 2016-03-29 MED ORDER — METOPROLOL TARTRATE 25 MG PO TABS
25.0000 mg | ORAL_TABLET | Freq: Two times a day (BID) | ORAL | Status: DC
  Administered 2016-03-30 – 2016-04-03 (×8): 25 mg via ORAL
  Filled 2016-03-29 (×9): qty 1

## 2016-03-29 MED ORDER — LAMOTRIGINE 25 MG PO TABS
25.0000 mg | ORAL_TABLET | Freq: Every day | ORAL | Status: DC
  Administered 2016-03-30 – 2016-04-02 (×5): 25 mg via ORAL
  Filled 2016-03-29 (×6): qty 1

## 2016-03-29 MED ORDER — ATORVASTATIN CALCIUM 10 MG PO TABS
20.0000 mg | ORAL_TABLET | Freq: Every day | ORAL | Status: DC
  Administered 2016-03-30 – 2016-04-02 (×4): 20 mg via ORAL
  Filled 2016-03-29 (×2): qty 1
  Filled 2016-03-29: qty 2
  Filled 2016-03-29: qty 1
  Filled 2016-03-29 (×3): qty 2

## 2016-03-29 MED ORDER — INSULIN ASPART 100 UNIT/ML ~~LOC~~ SOLN
0.0000 [IU] | Freq: Three times a day (TID) | SUBCUTANEOUS | Status: DC
  Administered 2016-03-30 (×3): 15 [IU] via SUBCUTANEOUS

## 2016-03-29 MED ORDER — LEVALBUTEROL HCL 1.25 MG/0.5ML IN NEBU
1.2500 mg | INHALATION_SOLUTION | Freq: Three times a day (TID) | RESPIRATORY_TRACT | Status: DC
  Administered 2016-03-30 – 2016-04-03 (×9): 1.25 mg via RESPIRATORY_TRACT
  Filled 2016-03-29 (×13): qty 0.5

## 2016-03-29 MED ORDER — CEFTRIAXONE SODIUM 1 G IJ SOLR
1.0000 g | INTRAMUSCULAR | Status: DC
  Administered 2016-03-30 – 2016-03-31 (×2): 1 g via INTRAVENOUS
  Filled 2016-03-29 (×2): qty 10

## 2016-03-29 MED ORDER — DILTIAZEM HCL ER COATED BEADS 240 MG PO CP24
240.0000 mg | ORAL_CAPSULE | Freq: Every day | ORAL | Status: DC
  Administered 2016-03-31: 240 mg via ORAL
  Filled 2016-03-29: qty 2
  Filled 2016-03-29 (×2): qty 1
  Filled 2016-03-29: qty 2

## 2016-03-29 MED ORDER — LEVALBUTEROL HCL 0.63 MG/3ML IN NEBU
0.6300 mg | INHALATION_SOLUTION | RESPIRATORY_TRACT | Status: DC | PRN
  Administered 2016-04-01: 0.63 mg via RESPIRATORY_TRACT

## 2016-03-29 MED ORDER — DIVALPROEX SODIUM 500 MG PO DR TAB
1000.0000 mg | DELAYED_RELEASE_TABLET | Freq: Every day | ORAL | Status: DC
  Filled 2016-03-29: qty 2

## 2016-03-29 MED ORDER — AMIODARONE HCL 200 MG PO TABS
200.0000 mg | ORAL_TABLET | Freq: Every day | ORAL | Status: DC
  Filled 2016-03-29: qty 1

## 2016-03-29 MED ORDER — DM-GUAIFENESIN ER 30-600 MG PO TB12
1.0000 | ORAL_TABLET | Freq: Two times a day (BID) | ORAL | Status: DC
  Administered 2016-03-30 – 2016-04-03 (×10): 1 via ORAL
  Filled 2016-03-29 (×10): qty 1

## 2016-03-29 MED ORDER — METHYLPREDNISOLONE SODIUM SUCC 125 MG IJ SOLR
80.0000 mg | Freq: Two times a day (BID) | INTRAMUSCULAR | Status: DC
  Administered 2016-03-30: 80 mg via INTRAVENOUS
  Filled 2016-03-29: qty 2

## 2016-03-29 MED ORDER — ACETAMINOPHEN 325 MG PO TABS
650.0000 mg | ORAL_TABLET | ORAL | Status: DC | PRN
Start: 2016-03-29 — End: 2016-04-03
  Administered 2016-03-31: 650 mg via ORAL
  Filled 2016-03-29: qty 2

## 2016-03-29 MED ORDER — IPRATROPIUM BROMIDE 0.02 % IN SOLN
0.5000 mg | RESPIRATORY_TRACT | Status: DC
  Administered 2016-03-29: 0.5 mg via RESPIRATORY_TRACT

## 2016-03-29 MED ORDER — IPRATROPIUM BROMIDE 0.02 % IN SOLN
RESPIRATORY_TRACT | Status: AC
  Administered 2016-03-29: 0.5 mg via RESPIRATORY_TRACT
  Filled 2016-03-29: qty 2.5

## 2016-03-29 MED ORDER — DEXTROSE 5 % IV SOLN
500.0000 mg | Freq: Once | INTRAVENOUS | Status: AC
  Administered 2016-03-29: 500 mg via INTRAVENOUS
  Filled 2016-03-29: qty 500

## 2016-03-29 MED ORDER — DEXTROSE 5 % IV SOLN
500.0000 mg | INTRAVENOUS | Status: DC
  Administered 2016-03-30 – 2016-03-31 (×2): 500 mg via INTRAVENOUS
  Filled 2016-03-29 (×2): qty 500

## 2016-03-29 MED ORDER — INSULIN ASPART PROT & ASPART (70-30 MIX) 100 UNIT/ML ~~LOC~~ SUSP
75.0000 [IU] | Freq: Two times a day (BID) | SUBCUTANEOUS | Status: DC
  Administered 2016-03-30 (×2): 75 [IU] via SUBCUTANEOUS
  Filled 2016-03-29: qty 10

## 2016-03-29 MED ORDER — AMLODIPINE BESYLATE 10 MG PO TABS
10.0000 mg | ORAL_TABLET | Freq: Every day | ORAL | Status: DC
  Administered 2016-03-30 – 2016-03-31 (×2): 10 mg via ORAL
  Filled 2016-03-29 (×2): qty 1

## 2016-03-29 MED ORDER — DEXTROSE 5 % IV SOLN
1.0000 g | Freq: Once | INTRAVENOUS | Status: AC
  Administered 2016-03-29: 1 g via INTRAVENOUS
  Filled 2016-03-29: qty 10

## 2016-03-29 MED ORDER — ONDANSETRON HCL 4 MG/2ML IJ SOLN
4.0000 mg | Freq: Four times a day (QID) | INTRAMUSCULAR | Status: DC | PRN
  Administered 2016-03-29: 4 mg via INTRAVENOUS
  Filled 2016-03-29: qty 2

## 2016-03-29 NOTE — H&P (Addendum)
History and Physical    NAITIK HERMANN ZOX:[id number] DOB: 1971-01-26 DOA: 03/29/2016  Referring MD/NP/PA:   PCP: Emeterio Reeve, MD   Patient coming from:  The patient is coming from home.  At baseline, pt is independent for most of ADL.   Chief Complaint: Fever, cough, shortness breath, chest pain  HPI: [name] is a 45 y.o. male with medical history significant of hypertension, hyperlipidemia, diabetes mellitus, asthma, depression, anxiety, diastolic congestive heart failure, CKD-II, morbid obesity, atrial flutter/A. fib on Xarelto, chronic low-back pain, OSA on CPAP, who presents with fever, cough, shortness breath, chest pain.  Patient reports that he has been having subjective fever in the past 2 or 3 weeks, he did not measure his temperature at home. He also has dry cough, shortness of breath and chest pain. His chest is located in the frontal chest, constant, moderate, radiating to the back. It is pleruritic, aggravated by coughing or deep breath. No tenderness over calf areas. Patient does not have nausea, vomiting, abdominal pain, diarrhea, symptoms of UTI or unilateral weakness. Patient states that his body weight is stable but has worsening leg edema.  ED Course: pt was found to have WBC 15.8, lactate 1.81-->2.2-->1.2, BNP 540.2, poc trop 0.10, temperature normal, tachycardia, tachypnea, oxygen desaturation to 82% on room air, worsening renal function.  Patient is admitted to stepdown as inpatient for further eval and treatment.   # CXR showed asymmetric lung process with diffuse opacity throughout the right lung, also present in October of 2016. The opacity appears a little more prominent and patchy today but the difference could be technical. Today's study is limited due to patient body habitus and poor penetration.  Review of Systems:   General: has subjective fevers, chills, no changes in body weight, has poor appetite, has fatigue HEENT: no blurry vision, hearing changes  or sore throat Pulm: has dyspnea, coughing, wheezing CV: has chest pain, no palpitations Abd: no nausea, vomiting, abdominal pain, diarrhea, constipation GU: no dysuria, burning on urination, increased urinary frequency, hematuria  Ext: has leg edema Neuro: no unilateral weakness, numbness, or tingling, no vision change or hearing loss Skin: no rash MSK: No muscle spasm, no deformity, no limitation of range of movement in spin Heme: No easy bruising.  Travel history: No recent long distant travel.  Allergy:  Allergies  Allergen Reactions  . Iodine Anaphylaxis and Other (See Comments)    Associated with the shellfish allergy  . Shellfish Allergy Anaphylaxis    Past Medical History  Diagnosis Date  . CHF (congestive heart failure) (HCC)     EF55-60%  . HTN (hypertension)   . Ptosis   . Atrial flutter (HCC)   . Atrial fibrillation (HCC)   . Morbid obesity (HCC)   . DM2 (diabetes mellitus, type 2) (HCC)   . Depression   . Chronic low back pain   . Asthma   . Vision abnormalities   . Abscess     right thigh  . Sleep apnea   . Chronic respiratory failure (HCC)   . Obesity hypoventilation syndrome (HCC)   . Patellar tendon rupture 12/18/2014  . Cellulitis of thigh 10/12/2011    Past Surgical History  Procedure Laterality Date  . Tonsillectomy    . Cholecystectomy    . Tonsillectomy    . Irrigation and debridement abscess  10/13/2011    Procedure: IRRIGATION AND DEBRIDEMENT ABSCESS;  Surgeon: Emelia Loron, MD;  Location: [facility] OR;  Service: General;  Laterality: Right;  Irrigation and  debridement right thigh abscess  . Application of wound vac      right thigh abscess  . Breath tek h pylori N/A 10/06/2014    Procedure: BREATH TEK H PYLORI;  Surgeon: Valarie Merino, MD;  Location: [facility];  Service: General;  Laterality: N/A;  . Patellar tendon repair Left 12/21/2014    Procedure: PATELLA TENDON REPAIR;  Surgeon: Sheral Apley, MD;  Location: [facility] OR;  Service:  Orthopedics;  Laterality: Left;    Social History:  reports that he has never smoked. He does not have any smokeless tobacco history on file. He reports that he drinks about 0.6 oz of alcohol per week. He reports that he does not use illicit drugs.  Family History:  Family History  Problem Relation Age of Onset  . Coronary artery disease Mother     42s; father had it in 16s; both deceased in their 71s    . Cancer Mother     liver  . Heart disease Father   . Cancer Father     prostate     Prior to Admission medications   Medication Sig Start Date End Date Taking? Authorizing Provider  acetaminophen (TYLENOL) 325 MG tablet Take 2 tablets (650 mg total) by mouth every 4 (four) hours as needed for mild pain (temp > 101.5). 07/26/15  Yes Maryruth Bun Rama, MD  digoxin (DIGOX) 0.25 MG tablet Take 0.25 mg by mouth daily.  05/26/10  Yes Historical Provider, MD  furosemide (LASIX) 40 MG tablet Take 2 tablets (80 mg total) by mouth daily. 07/26/15  Yes Christina P Rama, MD  insulin lispro protamine-lispro (HUMALOG MIX 75/25) (75-25) 100 UNIT/ML SUSP injection Inject 40-75 Units into the skin 3 (three) times daily. Take 75 units in the am, Take 40 units at noon and Take 75 units in the pm. 05/26/10  Yes Historical Provider, MD  loratadine (CLARITIN) 10 MG tablet Take 10 mg by mouth daily as needed for allergies.   Yes Historical Provider, MD  oxyCODONE (ROXICODONE) 15 MG immediate release tablet Take 15 mg by mouth every 6 (six) hours as needed for pain.  05/26/10  Yes Historical Provider, MD  potassium chloride SA (K-DUR,KLOR-CON) 20 MEQ tablet Take 1 tablet (20 mEq total) by mouth 3 (three) times daily. Patient taking differently: Take 20 mEq by mouth 2 (two) times daily.  07/26/15  Yes Christina P Rama, MD  albuterol (PROVENTIL) (2.5 MG/3ML) 0.083% nebulizer solution Take 3 mLs (2.5 mg total) by nebulization every 6 (six) hours as needed for wheezing. 07/26/15   Christina P Rama, MD  albuterol  (VENTOLIN HFA) 108 (90 BASE) MCG/ACT inhaler Inhale 2 puffs into the lungs every 6 (six) hours as needed. Shortness of breath Patient taking differently: Inhale 2 puffs into the lungs every 6 (six) hours as needed for wheezing or shortness of breath.  03/30/14   Alison Murray, MD  ALPRAZolam Prudy Feeler) 0.5 MG tablet Take 1 tablet (0.5 mg total) by mouth daily as needed for anxiety. Patient not taking: Reported on 03/29/2016 12/23/14   Maretta Bees, MD  Amino Acids-Protein Hydrolys (FEEDING SUPPLEMENT, PRO-STAT SUGAR FREE 64,) LIQD Take 30 mLs by mouth 3 (three) times daily between meals. Patient not taking: Reported on 03/29/2016 07/26/15   Maryruth Bun Rama, MD  amiodarone (PACERONE) 200 MG tablet Take 200 mg by mouth daily.    Historical Provider, MD  amLODipine (NORVASC) 10 MG tablet Take 10 mg by mouth daily.    Historical Provider, MD  atorvastatin (LIPITOR) 20 MG tablet TK 1 T PO  QHS 02/02/16   Historical Provider, MD  BREO ELLIPTA 100-25 MCG/INH AEPB INHALE 1 PUFF PO ONCE A DAY 03/21/16   Historical Provider, MD  buPROPion (WELLBUTRIN XL) 300 MG 24 hr tablet Take 300 mg by mouth Daily.  03/13/12   Historical Provider, MD  CARTIA XT 240 MG 24 hr capsule TK 1 C PO QD 03/21/16   Historical Provider, MD  divalproex (DEPAKOTE) 500 MG DR tablet Take 1,000 mg by mouth daily.     Historical Provider, MD  insulin aspart (NOVOLOG) 100 UNIT/ML injection Inject 0-20 Units into the skin 3 (three) times daily with meals. Patient not taking: Reported on 03/29/2016 07/26/15   Maryruth Bun Rama, MD  insulin aspart (NOVOLOG) 100 UNIT/ML injection Inject 0-5 Units into the skin at bedtime. Patient not taking: Reported on 03/29/2016 07/26/15   Maryruth Bun Rama, MD  insulin aspart (NOVOLOG) 100 UNIT/ML injection Inject 8 Units into the skin 3 (three) times daily with meals. Patient not taking: Reported on 03/29/2016 07/26/15   Maryruth Bun Rama, MD  insulin glargine (LANTUS) 100 UNIT/ML injection Inject 0.37 mLs (37 Units  total) into the skin 2 (two) times daily. Patient not taking: Reported on 03/29/2016 07/26/15   Maryruth Bun Rama, MD  lamoTRIgine (LAMICTAL) 25 MG tablet Take 25 mg by mouth at bedtime.     Historical Provider, MD  metoprolol tartrate (LOPRESSOR) 25 MG tablet Take 1 tablet (25 mg total) by mouth 2 (two) times daily. 07/26/15   Maryruth Bun Rama, MD  nitroGLYCERIN (NITROSTAT) 0.4 MG SL tablet Place 0.4 mg under the tongue every 5 (five) minutes as needed for chest pain.     Historical Provider, MD  NOVOLOG FLEXPEN 100 UNIT/ML FlexPen INJ 10 UNITS [place] QAM PRN FOR HIGH GLUCOSE LEVEL 03/21/16   Historical Provider, MD  nystatin (MYCOSTATIN/NYSTOP) 100000 UNIT/GM POWD Apply to affected area TID Patient not taking: Reported on 03/29/2016 07/26/15   Maryruth Bun Rama, MD  rivaroxaban (XARELTO) 20 MG TABS tablet Take 1 tablet (20 mg total) by mouth daily with supper. Patient taking differently: Take 20 mg by mouth daily with breakfast.  06/26/14   Simonne Martinet, NP    Physical Exam: Filed Vitals:   03/29/16 2015 03/29/16 2030 03/29/16 2045 03/29/16 2215  BP: 111/69 135/86 124/79 138/80  Pulse: 110 108 104 100  Temp:      TempSrc:      Resp: 32 27 30 31   Height:      Weight:      SpO2: 93% 90% 91% 90%   General: Not in acute distress HEENT:       Eyes: PERRL, EOMI, no scleral icterus.       ENT: No discharge from the ears and nose, no pharynx injection, no tonsillar enlargement.        Neck: Difficult to assess JVD due to morbid obesity, no bruit, no mass felt. Heme: No neck lymph node enlargement. Cardiac: S1/S2, RRR, No murmurs, No gallops or rubs. Pulm: Has mild wheezing bilaterally. No rales or rubs. Abd: Soft, nondistended, nontender, no rebound pain, no organomegaly, BS present. GU: No hematuria Ext: 1+ pitting leg edema bilaterally. 2+DP/PT pulse bilaterally. Musculoskeletal: No joint deformities, No joint redness or warmth, no limitation of ROM in spin. Skin: No rashes.  Neuro: Alert,  oriented X3, cranial nerves II-XII grossly intact, moves all extremities normally. Psych: Patient is not psychotic, no suicidal or hemocidal ideation.  Labs on  Admission: I have personally reviewed following labs and imaging studies  CBC:  Recent Labs Lab 03/29/16 1725 03/29/16 2155  WBC 15.8* 15.1*  NEUTROABS 12.3* 13.4*  HGB 12.1* 12.0*  HCT 36.7* 36.0*  MCV 84.6 84.3  PLT 191 194   Basic Metabolic Panel:  Recent Labs Lab 03/29/16 1725 03/29/16 2152  NA 138 138  K 4.3 4.6  CL 106 105  CO2 25 26  GLUCOSE 303* 301*  BUN 32* 32*  CREATININE 1.65* 1.51*  CALCIUM 8.2* 8.3*  MG 1.9  --    GFR: Estimated Creatinine Clearance: 137.7 mL/min (by C-G formula based on Cr of 1.51). Liver Function Tests:  Recent Labs Lab 03/29/16 1725 03/29/16 2152  AST 20 20  ALT 12* 13*  ALKPHOS 51 54  BILITOT 1.6* 1.5*  PROT 7.2 7.4  ALBUMIN 3.1* 3.0*   No results for input(s): LIPASE, AMYLASE in the last 168 hours. No results for input(s): AMMONIA in the last 168 hours. Coagulation Profile:  Recent Labs Lab 03/29/16 2155  INR 1.33   Cardiac Enzymes:  Recent Labs Lab 03/29/16 2155  TROPONINI 0.11*   BNP (last 3 results) No results for input(s): PROBNP in the last 8760 hours. HbA1C: No results for input(s): HGBA1C in the last 72 hours. CBG: No results for input(s): GLUCAP in the last 168 hours. Lipid Profile: No results for input(s): CHOL, HDL, LDLCALC, TRIG, CHOLHDL, LDLDIRECT in the last 72 hours. Thyroid Function Tests: No results for input(s): TSH, T4TOTAL, FREET4, T3FREE, THYROIDAB in the last 72 hours. Anemia Panel: No results for input(s): VITAMINB12, FOLATE, FERRITIN, TIBC, IRON, RETICCTPCT in the last 72 hours. Urine analysis:    Component Value Date/Time   COLORURINE AMBER* 03/29/2016 1930   APPEARANCEUR CLOUDY* 03/29/2016 1930   LABSPEC 1.017 03/29/2016 1930   PHURINE 5.0 03/29/2016 1930   GLUCOSEU NEGATIVE 03/29/2016 1930   HGBUR NEGATIVE  03/29/2016 1930   BILIRUBINUR SMALL* 03/29/2016 1930   KETONESUR NEGATIVE 03/29/2016 1930   PROTEINUR 30* 03/29/2016 1930   UROBILINOGEN 0.2 07/20/2015 2239   NITRITE NEGATIVE 03/29/2016 1930   LEUKOCYTESUR SMALL* 03/29/2016 1930   Sepsis Labs: @LABRCNTIP (procalcitonin:4,lacticidven:4) ) Recent Results (from the past 240 hour(s))  Blood culture (routine x 2)     Status: None (Preliminary result)   Collection Time: 03/29/16  7:22 PM  Result Value Ref Range Status   Specimen Description BLOOD RIGHT HAND  Final   Special Requests   Final    IN PEDIATRIC BOTTLE 3CC [facility]    Culture PENDING  Incomplete   Report Status PENDING  Incomplete     Radiological Exams on Admission: Dg Chest 2 View  03/29/2016  CLINICAL DATA:  Fever. EXAM: CHEST  2 VIEW COMPARISON:  Multiple x-rays since October 2016 FINDINGS: The study is markedly limited due to patient body habitus and poor penetration. The heart, hila, and mediastinum are unchanged. No pneumothorax identified. The left lung is essentially clear. There is diffuse opacity throughout the right lung which was also present in October of 2016. This opacity appears a little more prominent and patchy but the difference could be technical. IMPRESSION: Asymmetric lung process with diffuse opacity throughout the right lung, also present in October of 2016. The opacity appears a little more prominent and patchy today but the difference could be technical. Today's study is limited due to patient body habitus and poor penetration. Electronically Signed   By: Gerome Samavid  Williams III M.D   On: 03/29/2016 17:00  EKG: Independently reviewed. QTC 450, tachycardia, poor R-wave progression, questionable atrial flutter?  Assessment/Plan Principal Problem:   Acute on chronic respiratory failure with hypoxia (HCC) Active Problems:   Diabetes mellitus without complication (HCC)   Depression with anxiety   Atrial flutter (HCC)   Obesity  hypoventilation syndrome (HCC)   Long term current use of anticoagulant   Morbid obesity with body mass index of 60.0-69.9 in adult (HCC)   HTN (hypertension), benign   Acute on chronic diastolic (congestive) heart failure (HCC)   OSA (obstructive sleep apnea)   Asthma   Asthma exacerbation   DM (diabetes mellitus), type 2, uncontrolled (HCC)   Essential hypertension   CAP (community acquired pneumonia)   Sepsis (HCC)   Chest pain   Elevated troponin  Addendum: K=5.8 in AM. -Kayexalate 30 g was ordered  Acute on chronic respiratory failure with hypoxia: This is likely due to multifactorial etiology, including possible CAP given leukocytosis, pleuritic chest pain and x-ray findings of opacity, asthma exacerbation given wheezing on auscultation, and CHF exacerbation given elevated BNP and leg edema.  -will admit to SDU as inpt -check ABG -start BiPAP - IV Rocephin and azithromycin - Mucinex for cough  - Atrovent nebs and Xopenex Neb prn for SOB - Urine legionella and S. pneumococcal antigen - Follow up blood culture x2, sputum culture and respiratory virus panel - will not start IV diuretics since patient is septic with leukocytosis, tachycardia, tachypnea and elevated lactate.  Asthma exacerbation and possible CAP: -as above -solumedrol 80 mg bid  Acute on chronic diastolic congestive heart failure: 2-D echo on 05/10/15 showed EF of 55-60 percent. Patient is on Lasix 80 mg daily at home. He has bilateral leg edema and mildly elevated BNP, indicating setting of excess patient.  -Since patient is septic and has worsening renal function, wil hold his Lasix now -will start IV lasix as soon as sepsis is resolved and renal Fx improves -Continue metoprolol  Sepsis: Patient admits criteria for sepsis with leukocytosis, elevated lactate, tachycardia and tachypnea. Currently hemodynamically stable. -Since patient has CHF exacerbation, will not start IV fluid now -will get Procalcitonin  and trend lactic acid levels per sepsis protocol.  Addendum: lactic acid is trending up 1.81-->2.2-->1.2-->2.5 -will give gentle IVF now: NS 75 cc/h  Sepsis - Assessment  Performed at:    23:15  Vitals     Blood pressure 141/90, pulse 104, temperature 98.4 F (36.9 C), temperature source Oral, resp. rate 32, height 6\' 3"  (1.905 m), weight 267.169 kg (589 lb), SpO2 85 %.  Heart:     Tachycardic  Lungs:    Wheezing  Capillary Refill:   <2 sec  Peripheral Pulse:   Radial pulse palpable  Skin:     Normal Color  Elevated trop and chest pain: poc trop 0.10 on admission. This is likely due to demanding ischemia. Pt has chest pain which is likely due to possible CAP since his CP is pleuritic. Patient is on Xarelto, less likely to have PE. His chest pain is radiating to the back, aortic dissection is a potential differential diagnosis, however his blood pressure is not significantly elevated, no mediastinal widening on chest x-ray, in addition, his chest pain can be explained by possible pneumonia and demanding ischemia, therefore will not pursue dissection workup now. - cycle CE q6 x3 and repeat her EKG in the am  - Nitroglycerin, Morphine, metoprolol and lipitor  - Risk factor stratification: will check FLP, UDS and A1C  - 2d echo -  please call Card in A  OSA and obesity hypoventilation syndrome : -CPAP-->started BiPAP  AoCKD-II: Baseline Cre is 1.2, pt's Cre is 1.65 and BUN 32 on admission. Likely due to prerenal secondary to dehydration and continuation of diruetics - Check FeUrea - Follow up renal function by BMP - Hold lasix  Aflutter and Afib with RVR: CHA2DS2-VASc Score is 3, needs oral anticoagulation. Patient is on Xarelto at home. Heart rate is 110 to 130. -continue detoxing, amiodarone, metoprolol and cardizem -If getting worse, will start cardizem gtt -continue Xarelto  DM-II: Last A1c 10.8 on 07/20/15, poorly controled. Patient is taking Humalog mix insulin 75/25 at  home -will switch mix insulin 75/25 to 75/30 insulin and decreased dose form 75-40-75 units to 60-30-60. -SSI  Depression and anxiety: Stable, no suicidal or homicidal ideations. -Continue home medications: Depakote, Lamictal, Wellbutrin and Xanax  HTN: -Continue amlodipine, Cardizem, and metoprolol  DVT ppx: on Xarelto Code Status: Full code Family Communication: None at bed side. Disposition Plan:  Anticipate discharge back to previous home environment Consults called:  none Admission status: SDU/inpation       Date of Service 03/29/2016    Lorretta HarpIU, Jasneet Schobert [facility] Pager [phone]  If 7PM-7AM, please contact night-coverage [email or web address] Password [email or web address] 03/29/2016, 11:07 PM

## 2016-03-29 NOTE — ED Provider Notes (Signed)
CSN: [id number]     Arrival date & time 03/29/16  1507 History   First MD Initiated Contact with Patient 03/29/16 1603     Chief Complaint  Patient presents with  . Shortness of Breath  . Weakness     (Consider location/radiation/quality/duration/timing/severity/associated sxs/prior Treatment) HPI 45 year old male who presents with sob and weakness. History of morbid obesity, asthma, chronic diastolic heart failure, OSA, chronic respiratory failure, DM. States home health concerned about his sob recently. 1 week ago with cough, congestion, runny nose. Progressed to SOB, weakness, chest tightness, subjective fever and chills, increased LE edema. No orthopnea or PND. No N/V/D or abdominal. Similar to when he has had PNA in the past. EMS called today as he has not been able to get up from chair in 24 hours. On arrival was hypoxic on normal 3L of oxygen.   Past Medical History  Diagnosis Date  . CHF (congestive heart failure) (HCC)     EF55-60%  . HTN (hypertension)   . Ptosis   . Atrial flutter (HCC)   . Atrial fibrillation (HCC)   . Morbid obesity (HCC)   . DM2 (diabetes mellitus, type 2) (HCC)   . Depression   . Chronic low back pain   . Asthma   . Vision abnormalities   . Abscess     right thigh  . Sleep apnea   . Chronic respiratory failure (HCC)   . Obesity hypoventilation syndrome (HCC)   . Patellar tendon rupture 12/18/2014  . Cellulitis of thigh 10/12/2011   Past Surgical History  Procedure Laterality Date  . Tonsillectomy    . Cholecystectomy    . Tonsillectomy    . Irrigation and debridement abscess  10/13/2011    Procedure: IRRIGATION AND DEBRIDEMENT ABSCESS;  Surgeon: Emelia LoronMatthew Wakefield, MD;  Location: [facility] OR;  Service: General;  Laterality: Right;  Irrigation and debridement right thigh abscess  . Application of wound vac      right thigh abscess  . Breath tek h pylori N/A 10/06/2014    Procedure: BREATH TEK H PYLORI;  Surgeon: Valarie MerinoMatthew B Martin, MD;  [facility];  Service: General;  Laterality: N/A;  . Patellar tendon repair Left 12/21/2014    Procedure: PATELLA TENDON REPAIR;  Surgeon: Sheral Apleyimothy D Murphy, MD;  [facility];  Service: Orthopedics;  Laterality: Left;   Family History  Problem Relation Age of Onset  . Coronary artery disease Mother     6040s; father had it in 5530s; both deceased in their 2670s    . Cancer Mother     liver  . Heart disease Father   . Cancer Father     prostate   Social History  Substance Use Topics  . Smoking status: Never Smoker   . Smokeless tobacco: None     Comment: no significant tobacco use   . Alcohol Use: 0.6 oz/week    1 Standard drinks or equivalent per week     Comment: no significant use-- rare    Review of Systems 10/14 systems reviewed and are negative other than those stated in the HPI'   Allergies  Iodine and Shellfish allergy  Home Medications   Prior to Admission medications   Medication Sig Start Date End Date Taking? Authorizing Provider  acetaminophen (TYLENOL) 325 MG tablet Take 2 tablets (650 mg total) by mouth every 4 (four) hours as needed for mild pain (temp > 101.5). 07/26/15  Yes Maryruth Bunhristina P Rama, MD  digoxin (DIGOX) 0.25 MG  tablet Take 0.25 mg by mouth daily.  05/26/10  Yes Historical Provider, MD  furosemide (LASIX) 40 MG tablet Take 2 tablets (80 mg total) by mouth daily. 07/26/15  Yes Christina P Rama, MD  insulin lispro protamine-lispro (HUMALOG MIX 75/25) (75-25) 100 UNIT/ML SUSP injection Inject 40-75 Units into the skin 3 (three) times daily. Take 75 units in the am, Take 40 units at noon and Take 75 units in the pm. 05/26/10  Yes Historical Provider, MD  loratadine (CLARITIN) 10 MG tablet Take 10 mg by mouth daily as needed for allergies.   Yes Historical Provider, MD  oxyCODONE (ROXICODONE) 15 MG immediate release tablet Take 15 mg by mouth every 6 (six) hours as needed for pain.  05/26/10  Yes Historical Provider, MD  potassium chloride SA (K-DUR,KLOR-CON) 20 MEQ  tablet Take 1 tablet (20 mEq total) by mouth 3 (three) times daily. Patient taking differently: Take 20 mEq by mouth 2 (two) times daily.  07/26/15  Yes Christina P Rama, MD  albuterol (PROVENTIL) (2.5 MG/3ML) 0.083% nebulizer solution Take 3 mLs (2.5 mg total) by nebulization every 6 (six) hours as needed for wheezing. 07/26/15   Christina P Rama, MD  albuterol (VENTOLIN HFA) 108 (90 BASE) MCG/ACT inhaler Inhale 2 puffs into the lungs every 6 (six) hours as needed. Shortness of breath Patient taking differently: Inhale 2 puffs into the lungs every 6 (six) hours as needed for wheezing or shortness of breath.  03/30/14   Alison Murray, MD  ALPRAZolam Prudy Feeler) 0.5 MG tablet Take 1 tablet (0.5 mg total) by mouth daily as needed for anxiety. Patient not taking: Reported on 03/29/2016 12/23/14   Maretta Bees, MD  Amino Acids-Protein Hydrolys (FEEDING SUPPLEMENT, PRO-STAT SUGAR FREE 64,) LIQD Take 30 mLs by mouth 3 (three) times daily between meals. Patient not taking: Reported on 03/29/2016 07/26/15   Maryruth Bun Rama, MD  amiodarone (PACERONE) 200 MG tablet Take 200 mg by mouth daily.    Historical Provider, MD  amLODipine (NORVASC) 10 MG tablet Take 10 mg by mouth daily.    Historical Provider, MD  atorvastatin (LIPITOR) 20 MG tablet TK 1 T PO  QHS 02/02/16   Historical Provider, MD  BREO ELLIPTA 100-25 MCG/INH AEPB INHALE 1 PUFF PO ONCE A DAY 03/21/16   Historical Provider, MD  buPROPion (WELLBUTRIN XL) 300 MG 24 hr tablet Take 300 mg by mouth Daily.  03/13/12   Historical Provider, MD  CARTIA XT 240 MG 24 hr capsule TK 1 C PO QD 03/21/16   Historical Provider, MD  divalproex (DEPAKOTE) 500 MG DR tablet Take 1,000 mg by mouth daily.     Historical Provider, MD  insulin aspart (NOVOLOG) 100 UNIT/ML injection Inject 0-20 Units into the skin 3 (three) times daily with meals. Patient not taking: Reported on 03/29/2016 07/26/15   Maryruth Bun Rama, MD  insulin aspart (NOVOLOG) 100 UNIT/ML injection Inject 0-5  Units into the skin at bedtime. Patient not taking: Reported on 03/29/2016 07/26/15   Maryruth Bun Rama, MD  insulin aspart (NOVOLOG) 100 UNIT/ML injection Inject 8 Units into the skin 3 (three) times daily with meals. Patient not taking: Reported on 03/29/2016 07/26/15   Maryruth Bun Rama, MD  insulin glargine (LANTUS) 100 UNIT/ML injection Inject 0.37 mLs (37 Units total) into the skin 2 (two) times daily. Patient not taking: Reported on 03/29/2016 07/26/15   Maryruth Bun Rama, MD  lamoTRIgine (LAMICTAL) 25 MG tablet Take 25 mg by mouth at bedtime.  Historical Provider, MD  metoprolol tartrate (LOPRESSOR) 25 MG tablet Take 1 tablet (25 mg total) by mouth 2 (two) times daily. 07/26/15   Maryruth Bunhristina P Rama, MD  nitroGLYCERIN (NITROSTAT) 0.4 MG SL tablet Place 0.4 mg under the tongue every 5 (five) minutes as needed for chest pain.     Historical Provider, MD  NOVOLOG FLEXPEN 100 UNIT/ML FlexPen INJ 10 UNITS Snyder QAM PRN FOR HIGH GLUCOSE LEVEL 03/21/16   Historical Provider, MD  nystatin (MYCOSTATIN/NYSTOP) 100000 UNIT/GM POWD Apply to affected area TID Patient not taking: Reported on 03/29/2016 07/26/15   Maryruth Bunhristina P Rama, MD  rivaroxaban (XARELTO) 20 MG TABS tablet Take 1 tablet (20 mg total) by mouth daily with supper. Patient taking differently: Take 20 mg by mouth daily with breakfast.  06/26/14   Simonne MartinetPeter E Babcock, NP   BP 102/69 mmHg  Pulse 113  Temp(Src) 98.4 F (36.9 C) (Oral)  Resp 30  Ht 6\' 3"  (1.905 m)  Wt 589 lb (267.169 kg)  BMI 73.62 kg/m2  SpO2 91% Physical Exam Physical Exam  Nursing note and vitals reviewed. Constitutional:non-toxic, and in mild respiratory distress Head: Normocephalic and atraumatic.  Mouth/Throat: Oropharynx is clear and dry.  Neck: Normal range of motion. Neck supple.  Cardiovascular: Tachycardic rate and irregularly irregular rhythm.   Pulmonary/Chest: Tachypnea, speaks in full sentences, expiratory wheezes throughout Abdominal: Soft. Morbidly obese. There  is no tenderness. There is no rebound and no guarding.  Musculoskeletal: Normal range of motion.  Neurological: Alert, no facial droop, fluent speech, moves all extremities symmetrically Skin: Skin is warm and dry.  Psychiatric: Cooperative   ED Course  Procedures (including critical care time) Labs Review Labs Reviewed  CBC WITH DIFFERENTIAL/PLATELET - Abnormal; Notable for the following:    WBC 15.8 (*)    Hemoglobin 12.1 (*)    HCT 36.7 (*)    RDW 15.7 (*)    Neutro Abs 12.3 (*)    Monocytes Absolute 1.3 (*)    All other components within normal limits  COMPREHENSIVE METABOLIC PANEL - Abnormal; Notable for the following:    Glucose, Bld 303 (*)    BUN 32 (*)    Creatinine, Ser 1.65 (*)    Calcium 8.2 (*)    Albumin 3.1 (*)    ALT 12 (*)    Total Bilirubin 1.6 (*)    GFR calc non Af Amer 49 (*)    GFR calc Af Amer 56 (*)    All other components within normal limits  BRAIN NATRIURETIC PEPTIDE - Abnormal; Notable for the following:    B Natriuretic Peptide 540.2 (*)    All other components within normal limits  URINALYSIS, ROUTINE W REFLEX MICROSCOPIC (NOT AT [facility]) - Abnormal; Notable for the following:    Color, Urine AMBER (*)    APPearance CLOUDY (*)    Bilirubin Urine SMALL (*)    Protein, ur 30 (*)    Leukocytes, UA SMALL (*)    All other components within normal limits  URINE MICROSCOPIC-ADD ON - Abnormal; Notable for the following:    Squamous Epithelial / LPF 6-30 (*)    Bacteria, UA MANY (*)    Casts HYALINE CASTS (*)    All other components within normal limits  I-STAT TROPOININ, ED - Abnormal; Notable for the following:    Troponin i, poc 0.10 (*)    All other components within normal limits  I-STAT CG4 LACTIC ACID, ED - Abnormal; Notable for the following:    Lactic Acid,  Venous 2.21 (*)    All other components within normal limits  CULTURE, BLOOD (ROUTINE X 2)  CULTURE, BLOOD (ROUTINE X 2)  MAGNESIUM  I-STAT CG4 LACTIC ACID, ED    Imaging  Review Dg Chest 2 View  03/29/2016  CLINICAL DATA:  Fever. EXAM: CHEST  2 VIEW COMPARISON:  Multiple x-rays since October 2016 FINDINGS: The study is markedly limited due to patient body habitus and poor penetration. The heart, hila, and mediastinum are unchanged. No pneumothorax identified. The left lung is essentially clear. There is diffuse opacity throughout the right lung which was also present in October of 2016. This opacity appears a little more prominent and patchy but the difference could be technical. IMPRESSION: Asymmetric lung process with diffuse opacity throughout the right lung, also present in October of 2016. The opacity appears a little more prominent and patchy today but the difference could be technical. Today's study is limited due to patient body habitus and poor penetration. Electronically Signed   By: Gerome Sam III M.D   On: 03/29/2016 17:00   I have personally reviewed and evaluated these images and lab results as part of my medical decision-making.   EKG Interpretation   Date/Time:  Wednesday March 29 2016 15:25:44 EDT Ventricular Rate:  136 PR Interval:    QRS Duration: 91 QT Interval:  299 QTC Calculation: 450 R Axis:   -169 Text Interpretation:  Sinus tachycardia Low voltage with right axis  deviation Consider anterior infarct Aside from tachycardia similar to  prior EKg  Confirmed by Mayo Faulk MD, Mykhia Danish [phone]) on 03/29/2016 4:18:18 PM      MDM   Final diagnoses:  CAP (community acquired pneumonia)  Lobar pneumonia (HCC)  AKI (acute kidney injury) (HCC)  Leukocytosis  Asthma exacerbation    45 year old male with history of morbid obesity, asthma, chronic  diastolic heart failure, chronic respiratory failure on 3 L home oxygen who presents with one week of progressive cough, shortness of breath, and weakness. On presentation is afebrile, tachycardic, normotensive. Has 5 L oxygen requirement up from his baseline. With expiratory wheezes on exam. Difficult to  assess fluid status based on morbid obesity, but mucous membranes do appear dry. Presentation concerning for community-acquired pneumonia. A chest x-ray with asymmetric infiltrates, right greater than left. He does have a leukocytosis of 15, acute kidney injury, but normal lactate. Also evidence of mild troponin elevation of 0.1 which is felt more to be due to heart strain. EKG without acute ischemic changes and with evidence of sinus tachycardia and not atrial fibrillation. BNP is elevated in the 500s, but clinically seems less likely to be fluid overloaded given his acute kidney injury and pneumonia. Diuresis held at this time. Given ceftriaxone and azithromycin with Solu-Medrol and breathing treatments for asthma exacerbation and pneumonia. Discussed with Dr. Clyde Lundborg from hospitalist service who will admit for ongoing management.    Lavera Guise, MD 03/29/16 2035

## 2016-03-29 NOTE — ED Notes (Signed)
Bed: WA01 Expected date:  Expected time:  Means of arrival:  Comments: EMS- 45yo M, SOB/Weakness/Bariatric

## 2016-03-29 NOTE — Progress Notes (Addendum)
EDCM spoke to patient at bedside.  Patient last admission from 10/18 to 10/24.  At that time patient was [facility].  [facility] Nov 28th to April 30th.  Patient has been at home since, he lives alone.  Patient [facility].  Patient reports he has a Museum/gallery exhibitions officerrolator, a bariatric walker, 3 in 1 which he also uses as a shower stool.  Patient reports he pcp also completed form for patient for motorized wheelchair.  Patient reports he has 2 sisters, god mother and his pastor check in on him.  Patient uses [employer] to get to his appointments.  Patient reports he uses a cab to get to church.  Patient reports, "For the most part I do well."  Patient reports he felt very weak and was not able to get out of his chair at home for 24 hours.  Patient denies further home health needs or dme at this time.  Patient thankful for services.  No further EDCM needs at this time.

## 2016-03-29 NOTE — ED Notes (Signed)
ICU/Stepdown charge called to start timer. Will call back for report.

## 2016-03-29 NOTE — ED Notes (Signed)
EDP made aware of patient i-stat troponin result. 

## 2016-03-29 NOTE — ED Notes (Signed)
Called 2W charge nurse. Reports nurse caring for patient should be arriving soon and they will call me back.

## 2016-03-29 NOTE — ED Notes (Signed)
Per EMS, patient is from.  He has been running a fever and weakness for 2-3 weeks, but his symptoms became worse on 6-25.  EMS states upper lung sounds are clear.  Home health nurse states patient normally 94-97% on 3 liters.  He has a history of CHF.  Patient hasn't taken any of his medication today and has been sitting in chair for 24 hours.   O2: 86% on 3 liters and 94% on 6 liters  BP:130/82 HR:128  A-FIB R:28  CBG:295

## 2016-03-29 NOTE — Progress Notes (Signed)
PHARMACY NOTE -   Pharmacy to adjust doses of Rocephin and Azithromycin based on renal function. No adjustment necessary.  Will sign off at this time. Please reconsult if needed.  Thanks, Dorethea ClanMary Ann Harlow Basley, PharmD 03/29/2016

## 2016-03-30 ENCOUNTER — Inpatient Hospital Stay (HOSPITAL_COMMUNITY): Payer: Medicare Other

## 2016-03-30 DIAGNOSIS — R0789 Other chest pain: Secondary | ICD-10-CM

## 2016-03-30 DIAGNOSIS — J9621 Acute and chronic respiratory failure with hypoxia: Secondary | ICD-10-CM

## 2016-03-30 DIAGNOSIS — J45901 Unspecified asthma with (acute) exacerbation: Secondary | ICD-10-CM

## 2016-03-30 DIAGNOSIS — R079 Chest pain, unspecified: Secondary | ICD-10-CM

## 2016-03-30 DIAGNOSIS — I483 Typical atrial flutter: Secondary | ICD-10-CM

## 2016-03-30 DIAGNOSIS — I10 Essential (primary) hypertension: Secondary | ICD-10-CM

## 2016-03-30 DIAGNOSIS — I248 Other forms of acute ischemic heart disease: Secondary | ICD-10-CM

## 2016-03-30 LAB — BASIC METABOLIC PANEL
Anion gap: 7 (ref 5–15)
BUN: 38 mg/dL — ABNORMAL HIGH (ref 6–20)
CO2: 24 mmol/L (ref 22–32)
Calcium: 8.2 mg/dL — ABNORMAL LOW (ref 8.9–10.3)
Chloride: 107 mmol/L (ref 101–111)
Creatinine, Ser: 1.49 mg/dL — ABNORMAL HIGH (ref 0.61–1.24)
GFR calc Af Amer: >60 mL/min (ref >60)
GFR calc non Af Amer: 55 mL/min — ABNORMAL LOW (ref >60)
Glucose, Bld: 423 mg/dL — ABNORMAL HIGH (ref 65–99)
Potassium: 5.8 mmol/L — ABNORMAL HIGH (ref 3.5–5.1)
Sodium: 138 mmol/L (ref 135–145)

## 2016-03-30 LAB — ECHOCARDIOGRAM COMPLETE
EWDT: 239 ms
FS: 21 % — AB (ref 28–44)
Height: 75 in
IVS/LV PW RATIO, ED: 1.22
LA ID, A-P, ES: 47 mm
LADIAMINDEX: 1.34 cm/m2
LEFT ATRIUM END SYS DIAM: 47 mm
LV PW d: 13 mm — AB (ref 0.6–1.1)
MV Dec: 239
MVPG: 3 mmHg
MVPKEVEL: 79.5 m/s
WEIGHTICAEL: 9760 [oz_av]

## 2016-03-30 LAB — RESPIRATORY PANEL BY PCR
ADENOVIRUS-RVPPCR: NOT DETECTED
Bordetella pertussis: NOT DETECTED
CORONAVIRUS 229E-RVPPCR: NOT DETECTED
CORONAVIRUS HKU1-RVPPCR: NOT DETECTED
CORONAVIRUS OC43-RVPPCR: NOT DETECTED
Chlamydophila pneumoniae: NOT DETECTED
Coronavirus NL63: NOT DETECTED
INFLUENZA A H1 2009-RVPPR: NOT DETECTED
INFLUENZA A H1-RVPPCR: NOT DETECTED
INFLUENZA A H3-RVPPCR: NOT DETECTED
INFLUENZA B-RVPPCR: NOT DETECTED
Influenza A: NOT DETECTED
METAPNEUMOVIRUS-RVPPCR: NOT DETECTED
MYCOPLASMA PNEUMONIAE-RVPPCR: NOT DETECTED
PARAINFLUENZA VIRUS 1-RVPPCR: NOT DETECTED
PARAINFLUENZA VIRUS 2-RVPPCR: NOT DETECTED
Parainfluenza Virus 3: NOT DETECTED
Parainfluenza Virus 4: NOT DETECTED
RESPIRATORY SYNCYTIAL VIRUS-RVPPCR: NOT DETECTED
Rhinovirus / Enterovirus: NOT DETECTED

## 2016-03-30 LAB — BLOOD GAS, ARTERIAL
Acid-base deficit: 0.4 mmol/L (ref 0.0–2.0)
Bicarbonate: 26.6 mEq/L — ABNORMAL HIGH (ref 20.0–24.0)
O2 Content: 6 L/min
O2 Saturation: 88.7 %
Patient temperature: 98.6
TCO2: 24.1 mmol/L (ref 0–100)
pCO2 arterial: 55.7 mmHg — ABNORMAL HIGH (ref 35.0–45.0)
pH, Arterial: 7.3 — ABNORMAL LOW (ref 7.350–7.450)
pO2, Arterial: 65.5 mmHg — ABNORMAL LOW (ref 80.0–100.0)

## 2016-03-30 LAB — GLUCOSE, CAPILLARY
GLUCOSE-CAPILLARY: 386 mg/dL — AB (ref 65–99)
Glucose-Capillary: 357 mg/dL — ABNORMAL HIGH (ref 65–99)
Glucose-Capillary: 375 mg/dL — ABNORMAL HIGH (ref 65–99)
Glucose-Capillary: 461 mg/dL — ABNORMAL HIGH (ref 65–99)
Glucose-Capillary: 522 mg/dL (ref 65–99)

## 2016-03-30 LAB — HIV ANTIBODY (ROUTINE TESTING W REFLEX): HIV SCREEN 4TH GENERATION: NONREACTIVE

## 2016-03-30 LAB — LIPID PANEL
CHOLESTEROL: 144 mg/dL (ref 0–200)
HDL: 36 mg/dL — ABNORMAL LOW (ref >40)
LDL Cholesterol: 85 mg/dL (ref 0–99)
TRIGLYCERIDES: 116 mg/dL (ref <150)
Total CHOL/HDL Ratio: 4 RATIO
VLDL: 23 mg/dL (ref 0–40)

## 2016-03-30 LAB — STREP PNEUMONIAE URINARY ANTIGEN: Strep Pneumo Urinary Antigen: NEGATIVE

## 2016-03-30 LAB — LACTIC ACID, PLASMA: Lactic Acid, Venous: 2.5 mmol/L (ref 0.5–1.9)

## 2016-03-30 LAB — TROPONIN I
TROPONIN I: 0.1 ng/mL — AB (ref <0.03)
Troponin I: 0.06 ng/mL (ref <0.03)

## 2016-03-30 LAB — MRSA PCR SCREENING: MRSA by PCR: NEGATIVE

## 2016-03-30 LAB — CREATININE, URINE, RANDOM: Creatinine, Urine: 201.72 mg/dL

## 2016-03-30 LAB — POTASSIUM: Potassium: 5.5 mmol/L — ABNORMAL HIGH (ref 3.5–5.1)

## 2016-03-30 MED ORDER — DIGOXIN 125 MCG PO TABS
0.2500 mg | ORAL_TABLET | Freq: Every day | ORAL | Status: DC
  Administered 2016-03-30 – 2016-04-03 (×5): 0.25 mg via ORAL
  Filled 2016-03-30 (×5): qty 2

## 2016-03-30 MED ORDER — AMIODARONE HCL 200 MG PO TABS
200.0000 mg | ORAL_TABLET | Freq: Every day | ORAL | Status: DC
  Administered 2016-03-30 – 2016-04-03 (×5): 200 mg via ORAL
  Filled 2016-03-30 (×5): qty 1

## 2016-03-30 MED ORDER — INSULIN ASPART PROT & ASPART (70-30 MIX) 100 UNIT/ML ~~LOC~~ SUSP
40.0000 [IU] | Freq: Every day | SUBCUTANEOUS | Status: DC
  Administered 2016-03-30 – 2016-04-03 (×5): 40 [IU] via SUBCUTANEOUS

## 2016-03-30 MED ORDER — PERFLUTREN LIPID MICROSPHERE
1.0000 mL | INTRAVENOUS | Status: AC | PRN
  Administered 2016-03-30: 3 mL via INTRAVENOUS
  Filled 2016-03-30: qty 10

## 2016-03-30 MED ORDER — DIVALPROEX SODIUM 250 MG PO DR TAB
1000.0000 mg | DELAYED_RELEASE_TABLET | Freq: Every day | ORAL | Status: DC
  Administered 2016-03-30 – 2016-04-03 (×5): 1000 mg via ORAL
  Filled 2016-03-30: qty 2
  Filled 2016-03-30: qty 4
  Filled 2016-03-30: qty 2
  Filled 2016-03-30: qty 4
  Filled 2016-03-30: qty 2

## 2016-03-30 MED ORDER — BUPROPION HCL ER (XL) 300 MG PO TB24
300.0000 mg | ORAL_TABLET | Freq: Every day | ORAL | Status: DC
  Administered 2016-03-30 – 2016-04-03 (×5): 300 mg via ORAL
  Filled 2016-03-30 (×5): qty 1

## 2016-03-30 MED ORDER — PERFLUTREN LIPID MICROSPHERE
INTRAVENOUS | Status: AC
  Filled 2016-03-30: qty 10

## 2016-03-30 MED ORDER — FUROSEMIDE 10 MG/ML IJ SOLN
60.0000 mg | Freq: Two times a day (BID) | INTRAMUSCULAR | Status: DC
  Administered 2016-03-30: 60 mg via INTRAVENOUS
  Filled 2016-03-30: qty 6

## 2016-03-30 MED ORDER — FUROSEMIDE 10 MG/ML IJ SOLN
60.0000 mg | Freq: Once | INTRAMUSCULAR | Status: AC
  Administered 2016-03-30: 60 mg via INTRAVENOUS
  Filled 2016-03-30: qty 6

## 2016-03-30 MED ORDER — SODIUM CHLORIDE 0.9 % IV SOLN
INTRAVENOUS | Status: DC
  Administered 2016-03-30: 05:00:00 via INTRAVENOUS

## 2016-03-30 MED ORDER — INSULIN ASPART 100 UNIT/ML ~~LOC~~ SOLN
5.0000 [IU] | Freq: Once | SUBCUTANEOUS | Status: AC
  Administered 2016-03-30: 5 [IU] via SUBCUTANEOUS

## 2016-03-30 MED ORDER — SODIUM POLYSTYRENE SULFONATE 15 GM/60ML PO SUSP
30.0000 g | Freq: Once | ORAL | Status: AC
  Administered 2016-03-30: 30 g via ORAL
  Filled 2016-03-30 (×2): qty 120

## 2016-03-30 MED ORDER — INSULIN ASPART 100 UNIT/ML ~~LOC~~ SOLN
10.0000 [IU] | Freq: Once | SUBCUTANEOUS | Status: AC
  Administered 2016-03-30: 10 [IU] via SUBCUTANEOUS

## 2016-03-30 MED ORDER — INSULIN ASPART 100 UNIT/ML ~~LOC~~ SOLN
5.0000 [IU] | Freq: Three times a day (TID) | SUBCUTANEOUS | Status: DC
  Administered 2016-03-30 – 2016-04-03 (×13): 5 [IU] via SUBCUTANEOUS

## 2016-03-30 NOTE — Progress Notes (Signed)
  Echocardiogram 2D Echocardiogram with Definity has been performed.  [name], [name] 03/30/2016, 11:23 AM

## 2016-03-30 NOTE — Progress Notes (Signed)
Occupational Therapy Evaluation Patient Details Name: [name] MRN: [id number] DOB: 06/19/1971 Today's Date: 03/30/2016    History of Present Illness 45 y.o. male with medical history significant of hypertension, hyperlipidemia, diabetes mellitus, asthma, depression, anxiety, diastolic congestive heart failure, CKD-II, morbid obesity, atrial flutter/A. fib on Xarelto, chronic low-back pain, OSA on CPAP, who presents with fever, cough, shortness breath, chest pain.   Clinical Impression   Patient presents to OT with decreased ADL independence and safety due to the deficits listed below. He will benefit from skilled OT to maximize function and to facilitate a safe discharge. OT will follow.  Patient would benefit from Home Health OT at discharge as it is difficult to simulate his ADLs in hospital due to different equipment/environment. Feel as though assessment/treatment in his home environment would be beneficial.    Follow Up Recommendations  Home health OT    Equipment Recommendations  None recommended by OT    Recommendations for Other Services       Precautions / Restrictions Precautions Precautions: Fall Precaution Comments: obese; monitor VS Restrictions Weight Bearing Restrictions: No      Mobility Bed Mobility Overal bed mobility: Needs Assistance;+ 2 for safety/equipment Bed Mobility: Supine to Sit     Supine to sit: Mod assist;Min assist;+2 for safety/equipment;HOB elevated     General bed mobility comments: able to move legs over to EOB, needed assistance with trunk elevation and stabilization  Transfers Overall transfer level: Needs assistance Equipment used: Rolling walker (2 wheeled) (bariatric RW) Transfers: Sit to/from [employer] CorporationStand;Stand Pivot Transfers Sit to Stand: Min assist;+2 safety/equipment;From elevated surface Stand pivot transfers: Min assist;+2 safety/equipment       General transfer comment: rest breaks needed due to increased HR and  decreased O2    Balance                                            ADL Overall ADL's : Needs assistance/impaired Eating/Feeding: Set up;Sitting   Grooming: Set up;Sitting   Upper Body Bathing: Moderate assistance;Bed level   Lower Body Bathing: Maximal assistance;Total assistance;Bed level   Upper Body Dressing : Minimal assistance;Sitting Upper Body Dressing Details (indicate cue type and reason): due to lines Lower Body Dressing: Total assistance Lower Body Dressing Details (indicate cue type and reason): don socks due to positioning Toilet Transfer: Minimal assistance;+2 for safety/equipment;Stand-pivot;BSC;RW Toilet Transfer Details (indicate cue type and reason): simulated to recliner         Functional mobility during ADLs: Minimal assistance;+2 for safety/equipment;Rolling walker       Vision     Perception     Praxis      Pertinent Vitals/Pain Pain Assessment: No/denies pain     Hand Dominance     Extremity/Trunk Assessment Upper Extremity Assessment Upper Extremity Assessment: Generalized weakness   Lower Extremity Assessment Lower Extremity Assessment: Defer to PT evaluation   Cervical / Trunk Assessment Cervical / Trunk Assessment: Normal   Communication Communication Communication: No difficulties   Cognition Arousal/Alertness: Awake/alert Behavior During Therapy: WFL for tasks assessed/performed Overall Cognitive Status: Within Functional Limits for tasks assessed                     General Comments       Exercises       Shoulder Instructions      Home Living Family/patient expects to be discharged to::  Private residence Living Arrangements: Alone Available Help at Discharge: Other (Comment) (HHPT 3x/week, HHRN, supportive sister)               Bathroom Shower/Tub: Producer, television/film/videoWalk-in shower   Bathroom Toilet: Standard     Home Equipment: Environmental consultantWalker - 4 wheels;Electric scooter;Shower seat;Adaptive  equipment Adaptive Equipment: Reacher        Prior Functioning/Environment Level of Independence: Independent with assistive device(s)        Comments: able to bathe, dress (using reacher), toilet independently. Sister assists with groceries. Uses [employer] for transportation.    OT Diagnosis: Generalized weakness   OT Problem List: Decreased strength;Decreased activity tolerance;Decreased knowledge of use of DME or AE;Cardiopulmonary status limiting activity;Obesity;Increased edema   OT Treatment/Interventions: Self-care/ADL training;Energy conservation;DME and/or AE instruction;Therapeutic activities;Patient/family education    OT Goals(Current goals can be found in the care plan section) Acute Rehab OT Goals Patient Stated Goal: home when medically ready OT Goal Formulation: With patient Time For Goal Achievement: 04/13/16 Potential to Achieve Goals: Good ADL Goals Pt Will Perform Upper Body Bathing: with set-up;sitting Pt Will Perform Lower Body Bathing: with min assist;with adaptive equipment;sit to/from stand Pt Will Perform Upper Body Dressing: with set-up;sitting Pt Will Perform Lower Body Dressing: with min assist;with adaptive equipment;sit to/from stand Pt Will Transfer to Toilet: with supervision;ambulating;bedside commode Pt Will Perform Toileting - Clothing Manipulation and hygiene: with min assist;sit to/from stand  OT Frequency: Min 2X/week   Barriers to D/C:            Co-evaluation PT/OT/SLP Co-Evaluation/Treatment: Yes Reason for Co-Treatment: For patient/therapist safety PT goals addressed during session: Mobility/safety with mobility OT goals addressed during session: ADL's and self-care      End of Session Equipment Utilized During Treatment: Rolling walker;Oxygen Nurse Communication: Mobility status  Activity Tolerance: Patient tolerated treatment well Patient left: in chair;with call bell/phone within reach;Other (comment) (lift pad underneath  patient)   Time: [id number] OT Time Calculation (min): 27 min Charges:  OT General Charges $OT Visit: 1 Procedure OT Evaluation $OT Eval Moderate Complexity: 1 Procedure G-Codes:    Trudie Cervantes A 03/30/2016, 11:52 AM

## 2016-03-30 NOTE — Progress Notes (Signed)
Have spoken to Clydie BraunKaren NP tonight regarding pts O2 sats and elevated CBGs. Orders received.  Pt has remained on bipap 60% tonight with sats ranging in the 88-92%.  When taken off, O2 will fall into the 70's very quickly. Will continue to monitor.

## 2016-03-30 NOTE — Progress Notes (Signed)
03/30/16  1600 MD gave verbal order to discontinue foley catheter. Patient refused to have foley removed. Patient was educated that the risk of infection increases with foley. Patient expresses fear of wetting bed or floor.

## 2016-03-30 NOTE — Consult Note (Signed)
Patient ID: [name] MRN: [id number] DOB/AGE: [birth year] 45 y.o.  Admit date: 03/29/2016 Primary Physician Emeterio Reeve, MD  Primary Cardiologist Dr. Jens Som Requesting Physician: Susa Raring, MD   Chief Complaint  Shortness of breath, chest pain  HPI: [name] is a 45M with morbid obesity, chronic diastolic heart failure, atypical chest pain, hypertension, OSA, atrial fibrillation, and asthma who presented with shortness of breath and weakness.  [name] presented 6/28 with 2-3 weeks of fever, cough and shortness of breath.  On admission he was noted to have a leukocytosis (WBC 15.8), lactate 1.8, BNP 540 and troponin mildly elevated at 0.1.  He was hypoxic to 82% on room air.  Chest xray revealed an asymmetric lung process of unclear significance, as it had been seen previously.  He did receive IV fluids in the ED, which were discontinued by the Hospitalist service, and he was started on IV lasix.  He was also treated with antibiotics empirically, though there was some concern his leukocytosis was chronic.  Cardiology was consulted due to chest pain and positive troponin (peak 0.01).  An echo was ordered but was uninterpretable due to body habitus.  Definity contrast was used.   [name] reports that he is feeling much better.  He reports pleuritic chest pain but his breathing has improved.    Review of Systems:  A 12 point review of systems was obtained and was negative with exceptions as noted in the HPI.   Past Medical History  Diagnosis Date  . CHF (congestive heart failure) (HCC)     EF55-60%  . HTN (hypertension)   . Ptosis   . Atrial flutter (HCC)   . Atrial fibrillation (HCC)   . Morbid obesity (HCC)   . DM2 (diabetes mellitus, type 2) (HCC)   . Depression   . Chronic low back pain   . Asthma   . Vision abnormalities   . Abscess     right thigh  . Sleep apnea   . Chronic respiratory failure (HCC)   . Obesity hypoventilation syndrome (HCC)   . Patellar  tendon rupture 12/18/2014  . Cellulitis of thigh 10/12/2011  . CKD (chronic kidney disease), stage II     Medications Prior to Admission  Medication Sig Dispense Refill  . acetaminophen (TYLENOL) 325 MG tablet Take 2 tablets (650 mg total) by mouth every 4 (four) hours as needed for mild pain (temp > 101.5).    Marland Kitchen digoxin (DIGOX) 0.25 MG tablet Take 0.25 mg by mouth daily.     . furosemide (LASIX) 40 MG tablet Take 2 tablets (80 mg total) by mouth daily. 30 tablet   . insulin lispro protamine-lispro (HUMALOG MIX 75/25) (75-25) 100 UNIT/ML SUSP injection Inject 40-75 Units into the skin 3 (three) times daily. Take 75 units in the am, Take 40 units at noon and Take 75 units in the pm.    . loratadine (CLARITIN) 10 MG tablet Take 10 mg by mouth daily as needed for allergies.    Marland Kitchen oxyCODONE (ROXICODONE) 15 MG immediate release tablet Take 15 mg by mouth every 6 (six) hours as needed for pain.     . potassium chloride SA (K-DUR,KLOR-CON) 20 MEQ tablet Take 1 tablet (20 mEq total) by mouth 3 (three) times daily. (Patient taking differently: Take 20 mEq by mouth 2 (two) times daily. )    . albuterol (PROVENTIL) (2.5 MG/3ML) 0.083% nebulizer solution Take 3 mLs (2.5 mg total) by nebulization every 6 (six) hours as  needed for wheezing. 75 mL 12  . albuterol (VENTOLIN HFA) 108 (90 BASE) MCG/ACT inhaler Inhale 2 puffs into the lungs every 6 (six) hours as needed. Shortness of breath (Patient taking differently: Inhale 2 puffs into the lungs every 6 (six) hours as needed for wheezing or shortness of breath. ) 1 Inhaler 0  . ALPRAZolam (XANAX) 0.5 MG tablet Take 1 tablet (0.5 mg total) by mouth daily as needed for anxiety. (Patient not taking: Reported on 03/29/2016) 15 tablet 0  . Amino Acids-Protein Hydrolys (FEEDING SUPPLEMENT, PRO-STAT SUGAR FREE 64,) LIQD Take 30 mLs by mouth 3 (three) times daily between meals. (Patient not taking: Reported on 03/29/2016) 900 mL 0  . amiodarone (PACERONE) 200 MG tablet Take  200 mg by mouth daily.    Marland Kitchen amLODipine (NORVASC) 10 MG tablet Take 10 mg by mouth daily.    Marland Kitchen atorvastatin (LIPITOR) 20 MG tablet TK 1 T PO  QHS  0  . BREO ELLIPTA 100-25 MCG/INH AEPB INHALE 1 PUFF PO ONCE A DAY  0  . buPROPion (WELLBUTRIN XL) 300 MG 24 hr tablet Take 300 mg by mouth Daily.     Marland Kitchen CARTIA XT 240 MG 24 hr capsule TK 1 C PO QD  12  . divalproex (DEPAKOTE) 500 MG DR tablet Take 1,000 mg by mouth daily.     . insulin aspart (NOVOLOG) 100 UNIT/ML injection Inject 0-20 Units into the skin 3 (three) times daily with meals. (Patient not taking: Reported on 03/29/2016) 10 mL 11  . insulin aspart (NOVOLOG) 100 UNIT/ML injection Inject 0-5 Units into the skin at bedtime. (Patient not taking: Reported on 03/29/2016) 10 mL 11  . insulin aspart (NOVOLOG) 100 UNIT/ML injection Inject 8 Units into the skin 3 (three) times daily with meals. (Patient not taking: Reported on 03/29/2016) 10 mL 11  . insulin glargine (LANTUS) 100 UNIT/ML injection Inject 0.37 mLs (37 Units total) into the skin 2 (two) times daily. (Patient not taking: Reported on 03/29/2016) 10 mL 11  . lamoTRIgine (LAMICTAL) 25 MG tablet Take 25 mg by mouth at bedtime.     . metoprolol tartrate (LOPRESSOR) 25 MG tablet Take 1 tablet (25 mg total) by mouth 2 (two) times daily.    . nitroGLYCERIN (NITROSTAT) 0.4 MG SL tablet Place 0.4 mg under the tongue every 5 (five) minutes as needed for chest pain.     Marland Kitchen NOVOLOG FLEXPEN 100 UNIT/ML FlexPen INJ 10 UNITS [facility] QAM PRN FOR HIGH GLUCOSE LEVEL  12  . nystatin (MYCOSTATIN/NYSTOP) 100000 UNIT/GM POWD Apply to affected area TID (Patient not taking: Reported on 03/29/2016)  0  . rivaroxaban (XARELTO) 20 MG TABS tablet Take 1 tablet (20 mg total) by mouth daily with supper. (Patient taking differently: Take 20 mg by mouth daily with breakfast. ) 30 tablet 6     . amiodarone  200 mg Oral Daily  . amLODipine  10 mg Oral Daily  . atorvastatin  20 mg Oral q1800  . azithromycin  500 mg Intravenous Q24H   . buPROPion  300 mg Oral Daily  . cefTRIAXone (ROCEPHIN)  IV  1 g Intravenous Q24H  . dextromethorphan-guaiFENesin  1 tablet Oral BID  . digoxin  0.25 mg Oral Daily  . diltiazem  240 mg Oral Daily  . divalproex  1,000 mg Oral Daily  . furosemide  60 mg Intravenous BID  . insulin aspart  0-15 Units Subcutaneous TID WC  . insulin aspart  5 Units Subcutaneous TID WC  . insulin  aspart protamine- aspart  40 Units Subcutaneous QAC lunch  . insulin aspart protamine- aspart  75 Units Subcutaneous BID AC  . ipratropium  0.5 mg Nebulization TID  . lamoTRIgine  25 mg Oral QHS  . levalbuterol  1.25 mg Nebulization TID  . metoprolol tartrate  25 mg Oral BID  . rivaroxaban  20 mg Oral Q breakfast  . sodium polystyrene  30 g Oral Once    Infusions:    Allergies  Allergen Reactions  . Iodine Anaphylaxis and Other (See Comments)    Associated with the shellfish allergy  . Shellfish Allergy Anaphylaxis    Social History   Social History  . Marital Status: Divorced    Spouse Name: N/A  . Number of Children: N/A  . Years of Education: N/A   Occupational History  . unemployeed    Social History Main Topics  . Smoking status: Never Smoker   . Smokeless tobacco: Not on file     Comment: no significant tobacco use   . Alcohol Use: 0.6 oz/week    1 Standard drinks or equivalent per week     Comment: no significant use-- rare  . Drug Use: No  . Sexual Activity: Not on file   Other Topics Concern  . Not on file   Social History Narrative   Lives in [place]; works full time at [employer].    Occasionally takes echinacea and B12 supplements.    Regular diet; has not exercised regularly over the last 3 months.     Family History  Problem Relation Age of Onset  . Coronary artery disease Mother     68s; father had it in 46s; both deceased in their 63s    . Cancer Mother     liver  . Heart disease Father   . Cancer Father     prostate    PHYSICAL EXAM: Filed Vitals:   03/30/16  0900 03/30/16 0901  BP: 114/75 114/75  Pulse: 95 84  Temp: 98.2 F (36.8 C)   Resp: 21      Intake/Output Summary (Last 24 hours) at 03/30/16 1438 Last data filed at 03/30/16 1030  Gross per 24 hour  Intake    120 ml  Output   1700 ml  Net  -1580 ml    General:  Well appearing. No respiratory difficulty.  Morbidly obese.  HEENT: normal Neck: supple. Unable to assess JVP. Carotids 2+ bilat; no bruits. No lymphadenopathy or thryomegaly appreciated. Cor: PMI nondisplaced. Regular rate & rhythm. No rubs, gallops or murmurs. Lungs: clear to auscultation bilaterally  Abdomen: soft, nontender, nondistended. No hepatosplenomegaly. No bruits or masses. Good bowel sounds. Extremities: no cyanosis, clubbing, rash, 2+ pitting edema bilaterally  Neuro: alert & oriented x 3, cranial nerves grossly intact. moves all 4 extremities w/o difficulty. Affect pleasant.  Results for orders placed or performed during the hospital encounter of 03/29/16 (from the past 24 hour(s))  CBC with Differential     Status: Abnormal   Collection Time: 03/29/16  5:25 PM  Result Value Ref Range   WBC 15.8 (H) 4.0 - 10.5 K/uL   RBC 4.34 4.22 - 5.81 MIL/uL   Hemoglobin 12.1 (L) 13.0 - 17.0 g/dL   HCT 16.1 (L) 09.6 - 04.5 %   MCV 84.6 78.0 - 100.0 fL   MCH 27.9 26.0 - 34.0 pg   MCHC 33.0 30.0 - 36.0 g/dL   RDW 40.9 (H) 81.1 - 91.4 %   Platelets 191 150 - 400 K/uL  Neutrophils Relative % 78 %   Neutro Abs 12.3 (H) 1.7 - 7.7 K/uL   Lymphocytes Relative 13 %   Lymphs Abs 2.1 0.7 - 4.0 K/uL   Monocytes Relative 9 %   Monocytes Absolute 1.3 (H) 0.1 - 1.0 K/uL   Eosinophils Relative 0 %   Eosinophils Absolute 0.0 0.0 - 0.7 K/uL   Basophils Relative 0 %   Basophils Absolute 0.0 0.0 - 0.1 K/uL  Comprehensive metabolic panel     Status: Abnormal   Collection Time: 03/29/16  5:25 PM  Result Value Ref Range   Sodium 138 135 - 145 mmol/L   Potassium 4.3 3.5 - 5.1 mmol/L   Chloride 106 101 - 111 mmol/L   CO2 25 22  - 32 mmol/L   Glucose, Bld 303 (H) 65 - 99 mg/dL   BUN 32 (H) 6 - 20 mg/dL   Creatinine, Ser 7.82 (H) 0.61 - 1.24 mg/dL   Calcium 8.2 (L) 8.9 - 10.3 mg/dL   Total Protein 7.2 6.5 - 8.1 g/dL   Albumin 3.1 (L) 3.5 - 5.0 g/dL   AST 20 15 - 41 U/L   ALT 12 (L) 17 - 63 U/L   Alkaline Phosphatase 51 38 - 126 U/L   Total Bilirubin 1.6 (H) 0.3 - 1.2 mg/dL   GFR calc non Af Amer 49 (L) >60 mL/min   GFR calc Af Amer 56 (L) >60 mL/min   Anion gap 7 5 - 15  Magnesium     Status: None   Collection Time: 03/29/16  5:25 PM  Result Value Ref Range   Magnesium 1.9 1.7 - 2.4 mg/dL  Brain natriuretic peptide     Status: Abnormal   Collection Time: 03/29/16  5:25 PM  Result Value Ref Range   B Natriuretic Peptide 540.2 (H) 0.0 - 100.0 pg/mL  I-Stat Troponin, ED (not at [facility])     Status: Abnormal   Collection Time: 03/29/16  6:19 PM  Result Value Ref Range   Troponin i, poc 0.10 (HH) 0.00 - 0.08 ng/mL   Comment NOTIFIED PHYSICIAN    Comment 3          I-Stat CG4 Lactic Acid, ED     Status: None   Collection Time: 03/29/16  6:22 PM  Result Value Ref Range   Lactic Acid, Venous 1.81 0.5 - 1.9 mmol/L  Blood culture (routine x 2)     Status: None (Preliminary result)   Collection Time: 03/29/16  6:37 PM  Result Value Ref Range   Specimen Description BLOOD BLOOD LEFT FOREARM    Special Requests BOTTLES DRAWN AEROBIC AND ANAEROBIC 5CC EACH    Culture      NO GROWTH < 24 HOURS Performed at [facility]    Report Status PENDING   Blood culture (routine x 2)     Status: None (Preliminary result)   Collection Time: 03/29/16  7:22 PM  Result Value Ref Range   Specimen Description BLOOD RIGHT HAND    Special Requests IN PEDIATRIC BOTTLE 3CC    Culture      NO GROWTH < 24 HOURS Performed at [facility]    Report Status PENDING   I-Stat CG4 Lactic Acid, ED     Status: Abnormal   Collection Time: 03/29/16  7:24 PM  Result Value Ref Range   Lactic Acid, Venous 2.21 (HH) 0.5 - 1.9  mmol/L   Comment NOTIFIED PHYSICIAN   Urinalysis, Routine w reflex microscopic (not at [facility])  Status: Abnormal   Collection Time: 03/29/16  7:30 PM  Result Value Ref Range   Color, Urine AMBER (A) YELLOW   APPearance CLOUDY (A) CLEAR   Specific Gravity, Urine 1.017 1.005 - 1.030   pH 5.0 5.0 - 8.0   Glucose, UA NEGATIVE NEGATIVE mg/dL   Hgb urine dipstick NEGATIVE NEGATIVE   Bilirubin Urine SMALL (A) NEGATIVE   Ketones, ur NEGATIVE NEGATIVE mg/dL   Protein, ur 30 (A) NEGATIVE mg/dL   Nitrite NEGATIVE NEGATIVE   Leukocytes, UA SMALL (A) NEGATIVE  Urine microscopic-add on     Status: Abnormal   Collection Time: 03/29/16  7:30 PM  Result Value Ref Range   Squamous Epithelial / LPF 6-30 (A) NONE SEEN   WBC, UA 6-30 0 - 5 WBC/hpf   RBC / HPF 0-5 0 - 5 RBC/hpf   Bacteria, UA MANY (A) NONE SEEN   Casts HYALINE CASTS (A) NEGATIVE  Comprehensive metabolic panel     Status: Abnormal   Collection Time: 03/29/16  9:52 PM  Result Value Ref Range   Sodium 138 135 - 145 mmol/L   Potassium 4.6 3.5 - 5.1 mmol/L   Chloride 105 101 - 111 mmol/L   CO2 26 22 - 32 mmol/L   Glucose, Bld 301 (H) 65 - 99 mg/dL   BUN 32 (H) 6 - 20 mg/dL   Creatinine, Ser 1.61 (H) 0.61 - 1.24 mg/dL   Calcium 8.3 (L) 8.9 - 10.3 mg/dL   Total Protein 7.4 6.5 - 8.1 g/dL   Albumin 3.0 (L) 3.5 - 5.0 g/dL   AST 20 15 - 41 U/L   ALT 13 (L) 17 - 63 U/L   Alkaline Phosphatase 54 38 - 126 U/L   Total Bilirubin 1.5 (H) 0.3 - 1.2 mg/dL   GFR calc non Af Amer 54 (L) >60 mL/min   GFR calc Af Amer >60 >60 mL/min   Anion gap 7 5 - 15  Lactic acid, plasma     Status: None   Collection Time: 03/29/16  9:52 PM  Result Value Ref Range   Lactic Acid, Venous 1.2 0.5 - 1.9 mmol/L  Procalcitonin     Status: None   Collection Time: 03/29/16  9:52 PM  Result Value Ref Range   Procalcitonin 0.75 ng/mL  CBC with Differential     Status: Abnormal   Collection Time: 03/29/16  9:55 PM  Result Value Ref Range   WBC 15.1 (H) 4.0 -  10.5 K/uL   RBC 4.27 4.22 - 5.81 MIL/uL   Hemoglobin 12.0 (L) 13.0 - 17.0 g/dL   HCT 09.6 (L) 04.5 - 40.9 %   MCV 84.3 78.0 - 100.0 fL   MCH 28.1 26.0 - 34.0 pg   MCHC 33.3 30.0 - 36.0 g/dL   RDW 81.1 (H) 91.4 - 78.2 %   Platelets 194 150 - 400 K/uL   Neutrophils Relative % 89 %   Neutro Abs 13.4 (H) 1.7 - 7.7 K/uL   Lymphocytes Relative 7 %   Lymphs Abs 1.0 0.7 - 4.0 K/uL   Monocytes Relative 4 %   Monocytes Absolute 0.6 0.1 - 1.0 K/uL   Eosinophils Relative 0 %   Eosinophils Absolute 0.0 0.0 - 0.7 K/uL   Basophils Relative 0 %   Basophils Absolute 0.0 0.0 - 0.1 K/uL  Protime-INR     Status: Abnormal   Collection Time: 03/29/16  9:55 PM  Result Value Ref Range   Prothrombin Time 16.1 (H) 11.6 - 15.2  seconds   INR 1.33 0.00 - 1.49  APTT     Status: Abnormal   Collection Time: 03/29/16  9:55 PM  Result Value Ref Range   aPTT 39 (H) 24 - 37 seconds  HIV antibody     Status: None   Collection Time: 03/29/16  9:55 PM  Result Value Ref Range   HIV Screen 4th Generation wRfx Non Reactive Non Reactive  Troponin I-serum (0, 3, 6 hours)     Status: Abnormal   Collection Time: 03/29/16  9:55 PM  Result Value Ref Range   Troponin I 0.11 (HH) <0.03 ng/mL  Creatinine, urine, random     Status: None   Collection Time: 03/29/16 11:30 PM  Result Value Ref Range   Creatinine, Urine 201.72 mg/dL  Strep pneumoniae urinary antigen     Status: None   Collection Time: 03/29/16 11:30 PM  Result Value Ref Range   Strep Pneumo Urinary Antigen NEGATIVE NEGATIVE  Urine rapid drug screen (hosp performed)     Status: None   Collection Time: 03/29/16 11:30 PM  Result Value Ref Range   Opiates NONE DETECTED NONE DETECTED   Cocaine NONE DETECTED NONE DETECTED   Benzodiazepines NONE DETECTED NONE DETECTED   Amphetamines NONE DETECTED NONE DETECTED   Tetrahydrocannabinol NONE DETECTED NONE DETECTED   Barbiturates NONE DETECTED NONE DETECTED  Blood gas, arterial     Status: Abnormal   Collection  Time: 03/29/16 11:40 PM  Result Value Ref Range   O2 Content 6.0 L/min   Delivery systems NASAL CANNULA    pH, Arterial 7.320 (L) 7.350 - 7.450   pCO2 arterial 52.5 (H) 35.0 - 45.0 mmHg   pO2, Arterial 52.9 (L) 80.0 - 100.0 mmHg   Bicarbonate 26.3 (H) 20.0 - 24.0 mEq/L   TCO2 24.0 0 - 100 mmol/L   Acid-base deficit 0.1 0.0 - 2.0 mmol/L   O2 Saturation 81.7 %   Patient temperature 98.4    Collection site RIGHT RADIAL    Drawn by [id number]    Sample type ARTERIAL DRAW    Allens test (pass/fail) PASS PASS  Lactic acid, plasma     Status: Abnormal   Collection Time: 03/30/16 12:49 AM  Result Value Ref Range   Lactic Acid, Venous 2.5 (HH) 0.5 - 1.9 mmol/L  Troponin I-serum (0, 3, 6 hours)     Status: Abnormal   Collection Time: 03/30/16 12:49 AM  Result Value Ref Range   Troponin I 0.10 (HH) <0.03 ng/mL  Glucose, capillary     Status: Abnormal   Collection Time: 03/30/16  1:23 AM  Result Value Ref Range   Glucose-Capillary 386 (H) 65 - 99 mg/dL  MRSA PCR Screening     Status: None   Collection Time: 03/30/16  1:30 AM  Result Value Ref Range   MRSA by PCR NEGATIVE NEGATIVE  Glucose, capillary     Status: Abnormal   Collection Time: 03/30/16  3:43 AM  Result Value Ref Range   Glucose-Capillary 357 (H) 65 - 99 mg/dL  Respiratory Panel by PCR     Status: None   Collection Time: 03/30/16  5:02 AM  Result Value Ref Range   Adenovirus NOT DETECTED NOT DETECTED   Coronavirus 229E NOT DETECTED NOT DETECTED   Coronavirus HKU1 NOT DETECTED NOT DETECTED   Coronavirus NL63 NOT DETECTED NOT DETECTED   Coronavirus OC43 NOT DETECTED NOT DETECTED   Metapneumovirus NOT DETECTED NOT DETECTED   Rhinovirus / Enterovirus NOT DETECTED NOT DETECTED  Influenza A NOT DETECTED NOT DETECTED   Influenza A H1 NOT DETECTED NOT DETECTED   Influenza A H1 2009 NOT DETECTED NOT DETECTED   Influenza A H3 NOT DETECTED NOT DETECTED   Influenza B NOT DETECTED NOT DETECTED   Parainfluenza Virus 1 NOT DETECTED  NOT DETECTED   Parainfluenza Virus 2 NOT DETECTED NOT DETECTED   Parainfluenza Virus 3 NOT DETECTED NOT DETECTED   Parainfluenza Virus 4 NOT DETECTED NOT DETECTED   Respiratory Syncytial Virus NOT DETECTED NOT DETECTED   Bordetella pertussis NOT DETECTED NOT DETECTED   Chlamydophila pneumoniae NOT DETECTED NOT DETECTED   Mycoplasma pneumoniae NOT DETECTED NOT DETECTED  Lipid panel     Status: Abnormal   Collection Time: 03/30/16  5:12 AM  Result Value Ref Range   Cholesterol 144 0 - 200 mg/dL   Triglycerides 161 <096 mg/dL   HDL 36 (L) >04 mg/dL   Total CHOL/HDL Ratio 4.0 RATIO   VLDL 23 0 - 40 mg/dL   LDL Cholesterol 85 0 - 99 mg/dL  Basic metabolic panel     Status: Abnormal   Collection Time: 03/30/16  5:12 AM  Result Value Ref Range   Sodium 138 135 - 145 mmol/L   Potassium 5.8 (H) 3.5 - 5.1 mmol/L   Chloride 107 101 - 111 mmol/L   CO2 24 22 - 32 mmol/L   Glucose, Bld 423 (H) 65 - 99 mg/dL   BUN 38 (H) 6 - 20 mg/dL   Creatinine, Ser 5.40 (H) 0.61 - 1.24 mg/dL   Calcium 8.2 (L) 8.9 - 10.3 mg/dL   GFR calc non Af Amer 55 (L) >60 mL/min   GFR calc Af Amer >60 >60 mL/min   Anion gap 7 5 - 15  Troponin I     Status: Abnormal   Collection Time: 03/30/16  5:12 AM  Result Value Ref Range   Troponin I 0.06 (HH) <0.03 ng/mL  Glucose, capillary     Status: Abnormal   Collection Time: 03/30/16  7:12 AM  Result Value Ref Range   Glucose-Capillary 375 (H) 65 - 99 mg/dL   Comment 1 Notify RN    Comment 2 Document in Chart   Blood gas, arterial     Status: Abnormal   Collection Time: 03/30/16  7:32 AM  Result Value Ref Range   O2 Content 6.0 L/min   Delivery systems NASAL CANNULA    pH, Arterial 7.300 (L) 7.350 - 7.450   pCO2 arterial 55.7 (H) 35.0 - 45.0 mmHg   pO2, Arterial 65.5 (L) 80.0 - 100.0 mmHg   Bicarbonate 26.6 (H) 20.0 - 24.0 mEq/L   TCO2 24.1 0 - 100 mmol/L   Acid-base deficit 0.4 0.0 - 2.0 mmol/L   O2 Saturation 88.7 %   Patient temperature 98.6    Collection  site LEFT RADIAL    Drawn by COLLECTED BY RT    Sample type ARTERIAL DRAW    Allens test (pass/fail) PASS PASS  Glucose, capillary     Status: Abnormal   Collection Time: 03/30/16 11:53 AM  Result Value Ref Range   Glucose-Capillary 461 (H) 65 - 99 mg/dL  Potassium     Status: Abnormal   Collection Time: 03/30/16 12:30 PM  Result Value Ref Range   Potassium 5.5 (H) 3.5 - 5.1 mmol/L   Dg Chest 2 View  03/29/2016  CLINICAL DATA:  Fever. EXAM: CHEST  2 VIEW COMPARISON:  Multiple x-rays since October 2016 FINDINGS: The study is markedly limited  due to patient body habitus and poor penetration. The heart, hila, and mediastinum are unchanged. No pneumothorax identified. The left lung is essentially clear. There is diffuse opacity throughout the right lung which was also present in October of 2016. This opacity appears a little more prominent and patchy but the difference could be technical. IMPRESSION: Asymmetric lung process with diffuse opacity throughout the right lung, also present in October of 2016. The opacity appears a little more prominent and patchy today but the difference could be technical. Today's study is limited due to patient body habitus and poor penetration. Electronically Signed   By: Gerome Samavid  Williams III M.D   On: 03/29/2016 17:00   Dg Chest Port 1 View  03/30/2016  CLINICAL DATA:  Shortness of breath starting this morning EXAM: PORTABLE CHEST 1 VIEW COMPARISON:  6/28/ 17 FINDINGS: Cardiomediastinal silhouette is stable. Persistent diffuse hazy airspace opacification in right lung suspicious for infiltrate. Less likely asymmetric pulmonary edema. Study is limited by poor inspiration and patient's large body habitus. IMPRESSION: Persistent diffuse hazy airspace opacification in right lung suspicious for infiltrate. Less likely asymmetric pulmonary edema. Study is limited by poor inspiration and patient's large body habitus. Electronically Signed   By: Natasha MeadLiviu  Pop M.D.   On: 03/30/2016  08:09    Telemetry: Atrial fibrillation.  Rate mostly <100 bpm but >100 for brief periods.   ASSESSMENT/PLAN:  # Acute on chronic diastolic heart failure: BNP elevated and he appears volume overloaded on exam.  Breathing and renal function are improving with diuresis.  Unfortunately his heart was not well-visualized on echo.  TEE is the only viable option for imaging.  If he does not improve with diuresis we will consider that.  For now continue lasix 60mg  IV bid.  Daily weights do not appear to be accurate, as his weight increased from 589 to 610 kg from 6/28 to 6/29.  2g sodium restriction, 2L fluid restriction.  Continue metoprolol.  Ted hose if possible and I have asked him to elevate his legs when sitting.  # Demand ischemia: Troponin mildly elevated but not in ACS pattern.  Likely 2/2 pneumonia and acute heart failure.    # Hypertensive heart disease: Blood pressure well-controlled.  Continue amlodipine, metoprolol, and diltiazem.   # Paroxysmal atrial fibrillation/flutter: Mr. Ophelia CharterClark's EKGs report sinus tachycardia but they seem more consistent with atrial flutter.  On telemetry he appears to be in atrial fibrillation but rhythm is unclear.  Rates are relatively well-controlled.  Continue digoxin, diltiazem and metoprolol. Continue Xarelto.   # Pneumonia: # Hypoxic/hypercarbic respiratory failure: Per primary team.  Diuresis as above.  Signed: Neythan Kozlov C. Duke Salviaandolph, MD, [facility]  03/30/2016, 2:38 PM

## 2016-03-30 NOTE — Progress Notes (Signed)
03/30/16  1209  Notified MD BS 461. Per MD give schedule insulins and recheck in 3hrs.

## 2016-03-30 NOTE — Progress Notes (Signed)
[facility]  Patient Status: Active (receiving services up to time of hospitalization)  [facility] is providing the following services: RN, PT and OT  If patient discharges after hours, please call [phone].   Avie EchevariaKaren Nussbaum 03/30/2016, 9:22 AM

## 2016-03-30 NOTE — Progress Notes (Signed)
Inpatient Diabetes Program Recommendations  AACE/ADA: New Consensus Statement on Inpatient Glycemic Control (2015)  Target Ranges:  Prepandial:   less than 140 mg/dL      Peak postprandial:   less than 180 mg/dL (1-2 hours)      Critically ill patients:  140 - 180 mg/dL   Lab Results  Component Value Date   GLUCAP 522* 03/30/2016   HGBA1C 10.8* 07/20/2015   Results for Maurice MarchCLARK, Bhavin J (MRN [id number]) as of 03/30/2016 16:23  Ref. Range 03/30/2016 01:23 03/30/2016 03:43 03/30/2016 07:12 03/30/2016 11:53 03/30/2016 15:12  Glucose-Capillary Latest Ref Range: 65-99 mg/dL 409386 (H) 811357 (H) 914375 (H) 461 (H) 522 (HH)   Review of Glycemic Control  Diabetes history: DM2 Outpatient Diabetes medications: Humalog 75/25 75-40-75 units tid Current orders for Inpatient glycemic control: Same as above + Novolog 5 units tidwc + moderate tidwc  Blood sugars 357-522. Needs IV insulin/GlucoStabilizer.  Inpatient Diabetes Program Recommendations:    Consider IV insulin/GlucoStabilizer for glycemic control. Carbohydrate coverage per [employer].  Will follow. Thank you. Ailene Ardshonda Sergio Hobart, RD, LDN, CDE Inpatient Diabetes Coordinator [phone]

## 2016-03-30 NOTE — Progress Notes (Addendum)
PROGRESS NOTE                                                                                                                                                                                                             Patient Demographics:    [name], is a 45 y.o. male, DOB - 1970/12/22, UEA:[id number]  Admit date - 03/29/2016   Admitting Physician Lorretta Harp, MD  Outpatient Primary MD for the patient is Emeterio Reeve, MD  LOS - 1  Chief Complaint  Patient presents with  . Shortness of Breath  . Weakness       Brief Narrative    [name] is a 45 y.o. male with medical history significant of hypertension, hyperlipidemia, diabetes mellitus, asthma, depression, anxiety, diastolic congestive heart failure, CKD-II, morbid obesity, atrial flutter/A. fib on Xarelto, chronic low-back pain, OSA on CPAP, who presents with fever, cough, shortness breath, chest pain.  Patient reports that he has been having subjective fever in the past 2 or 3 weeks, he did not measure his temperature at home. He also has dry cough, shortness of breath and chest pain. His chest is located in the frontal chest, constant, moderate, radiating to the back. It is pleruritic, aggravated by coughing or deep breath. No tenderness over calf areas. Patient does not have nausea, vomiting, abdominal pain, diarrhea, symptoms of UTI or unilateral weakness. Patient states that his body weight is stable but has worsening leg edema.  ED Course: pt was found to have WBC 15.8, lactate 1.81-->2.2-->1.2, BNP 540.2, poc trop 0.10, temperature normal, tachycardia, tachypnea, oxygen desaturation to 82% on room air, worsening renal function. Patient is admitted to stepdown as inpatient for further eval and treatment.   # CXR showed asymmetric lung process with diffuse opacity throughout the right lung, also present in October of 2016. The opacity appears a little more  prominent and patchy today but the difference could be technical. Today's study is limited due to patient body habitus and poor penetration.     Subjective:    [name] today has, No headache, No chest pain, No abdominal pain - No Nausea, No new weakness tingling or numbness, Mild dry cough, improved shortness of breath and orthopnea.   Assessment  & Plan :     1. Acute on chronic hypoxic respiratory failure. This is due  to combination of acute on chronic diastolic CHF last EF 60%, obesity hypoventilation syndrome, possible community-acquired pneumonia.   He does not have any signs of asthma exacerbation, he does have small amounts of dry cough but no fevers, he does have orthopnea and fluid/weight gain in the last few days after having a large pizza. Elevated BNP. At this time I have stopped all IV fluids, he is not septic, his lactate was elevated due to hypoxia, he is nontoxic appearing, does not have fever and has chronic leukocytosis going back 8 months in his chart.    A. CHF - We will stop IV fluids and start IV Lasix, continue beta blocker, repeat echocardiogram, requested cardiology to evaluate.  B. CAP - chest x-ray could be asymmetrical edema, however for now continue Rocephin and azithromycin until cultures are back. Leukocytosis is chronic, he is afebrile and has only a dry cough. We'll taper down antibiotics fast based on cultures.  C. OHS/OSA with morbid obesity - CPAP at night, his home machine has broken and we will request case management to assist him with the same, weight loss per PCP.    2. Mild elevation of troponin in non-ACS pattern. Trend is flat. EKG is nonacute, check echocardiogram to evaluate wall motion and EF, he is chest pain-free, he is already on xaralto, statin and beta blocker will continue for secondary prevention. No need to add aspirin.  3. CK D stage III. Baseline creatinine close to 1.3. He is close to baseline. For now he needs to be diuresed will  monitor.  4. History of asthma. Stable. No wheezing stop steroids.  5. History of atrial flutter. [place] vasc 2 score of at least 3. On xaralto, digoxin,  beta blocker and amiodarone which will be continued. Currently appears to be in sinus tachycardia.  6. Essential hypertension. Continue combination of beta blocker, Norvasc and monitor.  7. Dyslipidemia. On statin.  8.  Hyperkalemia. Started on Lasix IV along with Kayexalate will monitor.   9. History of depression and anxiety. Continue Depakote, Lamictal, Wellbutrin and Xanax combination. No acute issues.   10. DM2 - combination of 70/30 along with pre-meal NovoLog and sliding scale. Will monitor CBGs. Morning CBGs were high on 03/30/2016 due to steroids which have been stopped.  Lab Results  Component Value Date   HGBA1C 10.8* 07/20/2015   CBG (last 3)   Recent Labs  03/30/16 0123 03/30/16 0343 03/30/16 0712  GLUCAP 386* 357* 375*      Family Communication  :  None present  Code Status :  Full  Diet : Heart Healthy - Low Carb  Disposition Plan  :  Stepdown  Consults  :  Cards  Procedures  :    Echocardiogram  DVT Prophylaxis  : Xaralto  Lab Results  Component Value Date   PLT 194 03/29/2016    Inpatient Medications  Scheduled Meds: . amiodarone  200 mg Oral Daily  . amLODipine  10 mg Oral Daily  . atorvastatin  20 mg Oral q1800  . azithromycin  500 mg Intravenous Q24H  . buPROPion  300 mg Oral Daily  . cefTRIAXone (ROCEPHIN)  IV  1 g Intravenous Q24H  . dextromethorphan-guaiFENesin  1 tablet Oral BID  . digoxin  0.25 mg Oral Daily  . diltiazem  240 mg Oral Daily  . divalproex  1,000 mg Oral Daily  . furosemide  60 mg Intravenous BID  . insulin aspart  0-15 Units Subcutaneous TID WC  . insulin aspart  5 Units Subcutaneous TID WC  . insulin aspart protamine- aspart  40 Units Subcutaneous QAC lunch  . insulin aspart protamine- aspart  75 Units Subcutaneous BID AC  . ipratropium  0.5 mg  Nebulization TID  . lamoTRIgine  25 mg Oral QHS  . levalbuterol  1.25 mg Nebulization TID  . metoprolol tartrate  25 mg Oral BID  . rivaroxaban  20 mg Oral Q breakfast  . sodium polystyrene  30 g Oral Once   Continuous Infusions:  PRN Meds:.acetaminophen, levalbuterol, loratadine, morphine injection, nitroGLYCERIN, ondansetron (ZOFRAN) IV, oxyCODONE, perflutren lipid microspheres (DEFINITY) IV suspension  Antibiotics  :    Anti-infectives    Start     Dose/Rate Route Frequency Ordered Stop   03/30/16 2000  azithromycin (ZITHROMAX) 500 mg in dextrose 5 % 250 mL IVPB     500 mg 250 mL/hr over 60 Minutes Intravenous Every 24 hours 03/29/16 2109 04/06/16 1959   03/30/16 1800  cefTRIAXone (ROCEPHIN) 1 g in dextrose 5 % 50 mL IVPB     1 g 100 mL/hr over 30 Minutes Intravenous Every 24 hours 03/29/16 2109 04/06/16 1759   03/29/16 2115  cefTRIAXone (ROCEPHIN) 1 g in dextrose 5 % 50 mL IVPB  Status:  Discontinued     1 g 100 mL/hr over 30 Minutes Intravenous Every 24 hours 03/29/16 2108 03/29/16 2253   03/29/16 1800  cefTRIAXone (ROCEPHIN) 1 g in dextrose 5 % 50 mL IVPB     1 g 100 mL/hr over 30 Minutes Intravenous  Once 03/29/16 1748 03/29/16 2152   03/29/16 1800  azithromycin (ZITHROMAX) 500 mg in dextrose 5 % 250 mL IVPB     500 mg 250 mL/hr over 60 Minutes Intravenous  Once 03/29/16 1748 03/29/16 1900         Objective:   Filed Vitals:   03/30/16 0438 03/30/16 0449 03/30/16 0645 03/30/16 0901  BP: 125/86  125/81 114/75  Pulse: 90 92 85 84  Temp:   98.1 F (36.7 C)   TempSrc:      Resp: 26 18 23    Height:      Weight:      SpO2: 93% 96% 92%     Wt Readings from Last 3 Encounters:  03/30/16 276.694 kg (610 lb)  07/26/15 276.694 kg (610 lb)  05/10/15 280.595 kg (618 lb 9.6 oz)     Intake/Output Summary (Last 24 hours) at 03/30/16 0956 Last data filed at 03/30/16 0700  Gross per 24 hour  Intake    120 ml  Output    700 ml  Net   -580 ml     Physical  Exam  Morbidly obese, Awake Alert, Oriented X 3, No new F.N deficits, Normal affect [place].AT,PERRAL Supple Neck,No JVD, No cervical lymphadenopathy appriciated.  Symmetrical Chest wall movement, Distant breath sounds, could not appreciate rales Distant heart sounds, No Parasternal Heave +ve B.Sounds, Abd Soft, No tenderness, No organomegaly appriciated, No rebound - guarding or rigidity. No Cyanosis, Clubbing, 1+ edema, No new Rash or bruise       Data Review:    CBC  Recent Labs Lab 03/29/16 1725 03/29/16 2155  WBC 15.8* 15.1*  HGB 12.1* 12.0*  HCT 36.7* 36.0*  PLT 191 194  MCV 84.6 84.3  MCH 27.9 28.1  MCHC 33.0 33.3  RDW 15.7* 15.9*  LYMPHSABS 2.1 1.0  MONOABS 1.3* 0.6  EOSABS 0.0 0.0  BASOSABS 0.0 0.0    Chemistries   Recent Labs Lab 03/29/16 1725 03/29/16 2152  03/30/16 0512  NA 138 138 138  K 4.3 4.6 5.8*  CL 106 105 107  CO2 25 26 24   GLUCOSE 303* 301* 423*  BUN 32* 32* 38*  CREATININE 1.65* 1.51* 1.49*  CALCIUM 8.2* 8.3* 8.2*  MG 1.9  --   --   AST 20 20  --   ALT 12* 13*  --   ALKPHOS 51 54  --   BILITOT 1.6* 1.5*  --    ------------------------------------------------------------------------------------------------------------------  Recent Labs  03/30/16 0512  CHOL 144  HDL 36*  LDLCALC 85  TRIG [id number]  CHOLHDL 4.0    Lab Results  Component Value Date   HGBA1C 10.8* 07/20/2015   ------------------------------------------------------------------------------------------------------------------ No results for input(s): TSH, T4TOTAL, T3FREE, THYROIDAB in the last 72 hours.  Invalid input(s): FREET3 ------------------------------------------------------------------------------------------------------------------ No results for input(s): VITAMINB12, FOLATE, FERRITIN, TIBC, IRON, RETICCTPCT in the last 72 hours.  Coagulation profile  Recent Labs Lab 03/29/16 2155  INR 1.33    No results for input(s): DDIMER in the last 72  hours.  Cardiac Enzymes  Recent Labs Lab 03/29/16 2155 03/30/16 0049 03/30/16 0512  TROPONINI 0.11* 0.10* 0.06*   ------------------------------------------------------------------------------------------------------------------    Component Value Date/Time   BNP 540.2* 03/29/2016 1725    Micro Results Recent Results (from the past 240 hour(s))  Blood culture (routine x 2)     Status: None (Preliminary result)   Collection Time: 03/29/16  7:22 PM  Result Value Ref Range Status   Specimen Description BLOOD RIGHT HAND  Final   Special Requests   Final    IN PEDIATRIC BOTTLE 3[facility]    Culture PENDING  Incomplete   Report Status PENDING  Incomplete  MRSA PCR Screening     Status: None   Collection Time: 03/30/16  1:30 AM  Result Value Ref Range Status   MRSA by PCR NEGATIVE NEGATIVE Final    Comment:        The GeneXpert MRSA Assay (FDA approved for NASAL specimens only), is one component of a comprehensive MRSA colonization surveillance program. It is not intended to diagnose MRSA infection nor to guide or monitor treatment for MRSA infections.     Radiology Reports Dg Chest 2 View  03/29/2016  CLINICAL DATA:  Fever. EXAM: CHEST  2 VIEW COMPARISON:  Multiple x-rays since October 2016 FINDINGS: The study is markedly limited due to patient body habitus and poor penetration. The heart, hila, and mediastinum are unchanged. No pneumothorax identified. The left lung is essentially clear. There is diffuse opacity throughout the right lung which was also present in October of 2016. This opacity appears a little more prominent and patchy but the difference could be technical. IMPRESSION: Asymmetric lung process with diffuse opacity throughout the right lung, also present in October of 2016. The opacity appears a little more prominent and patchy today but the difference could be technical. Today's study is limited due to patient body habitus and poor  penetration. Electronically Signed   By: Gerome Samavid  Williams III M.D   On: 03/29/2016 17:00   Dg Chest Port 1 View  03/30/2016  CLINICAL DATA:  Shortness of breath starting this morning EXAM: PORTABLE CHEST 1 VIEW COMPARISON:  6/28/ 17 FINDINGS: Cardiomediastinal silhouette is stable. Persistent diffuse hazy airspace opacification in right lung suspicious for infiltrate. Less likely asymmetric pulmonary edema. Study is limited by poor inspiration and patient's large body habitus. IMPRESSION: Persistent diffuse hazy airspace opacification in right lung suspicious for infiltrate. Less likely asymmetric  pulmonary edema. Study is limited by poor inspiration and patient's large body habitus. Electronically Signed   By: Natasha Mead M.D.   On: 03/30/2016 08:09    Time Spent in minutes  30   Jahseh Lucchese K M.D on 03/30/2016 at 9:56 AM  Between 7am to 7pm - Pager - [phone]  After 7pm go to [email or web address] - password [facility]  [facility] -  Office  [phone]

## 2016-03-30 NOTE — Evaluation (Signed)
Physical Therapy Evaluation Patient Details Name: [name] MRN: [id number] DOB: 01/26/1971 Today's Date: 03/30/2016   History of Present Illness  45 y.o. male with medical history significant of hypertension, hyperlipidemia, diabetes mellitus, asthma, depression, anxiety, diastolic congestive heart failure, CKD-II, morbid obesity, atrial flutter/A. fib on Xarelto, chronic low-back pain, OSA on CPAP, who presents with fever, cough, shortness breath, chest pain.  Clinical Impression  The patient is very pleasant and  Pleased to be able to mobilize today to the recliner. Not ready to ambulate. sats dropping with activity. The patient  States that he plans to return home. Has HHPT on board. Pt admitted with above diagnosis. Pt currently with functional limitations due to the deficits listed below (see PT Problem List).  Pt will benefit from skilled PT to increase their independence and safety with mobility to allow discharge to the venue listed below.       Follow Up Recommendations Home health PT;Supervision/Assistance - 24 hour    Equipment Recommendations  None recommended by PT    Recommendations for Other Services       Precautions / Restrictions Precautions Precautions: Fall Precaution Comments: obese; monitor VS Restrictions Weight Bearing Restrictions: No      Mobility  Bed Mobility Overal bed mobility: Needs Assistance;+ 2 for safety/equipment Bed Mobility: Supine to Sit     Supine to sit: Mod assist;Min assist;+2 for safety/equipment;HOB elevated     General bed mobility comments: able to move legs over to EOB, needed assistance with trunk elevation and stabilization  Transfers Overall transfer level: Needs assistance Equipment used: Rolling walker (2 wheeled) (bariatric RW) Transfers: Sit to/from [employer];Stand Pivot Transfers Sit to Stand: Min assist;+2 safety/equipment;From elevated surface Stand pivot transfers: Min assist;+2 safety/equipment       General  transfer comment: rest breaks needed due to increased HR and decreased O2, HR 127, sats dropped to 85% on 6 l.  Ambulation/Gait                Stairs            Wheelchair Mobility    Modified Rankin (Stroke Patients Only)       Balance Overall balance assessment: Needs assistance         Standing balance support: During functional activity;Bilateral upper extremity supported Standing balance-Leahy Scale: Fair                               Pertinent Vitals/Pain Pain Assessment: No/denies pain    Home Living Family/patient expects to be discharged to:: Private residence Living Arrangements: Alone Available Help at Discharge: Other (Comment) (HHPT 3x/week, HHRN, supportive sister)           Home Equipment: Walker - 4 wheels;Electric scooter;Shower seat;Adaptive equipment      Prior Function Level of Independence: Independent with assistive device(s)         Comments: able to bathe, dress (using reacher), toilet independently. Sister assists with groceries. Uses [employer] for transportation.     Hand Dominance        Extremity/Trunk Assessment   Upper Extremity Assessment: Defer to OT evaluation           Lower Extremity Assessment: Generalized weakness      Cervical / Trunk Assessment: Normal  Communication   Communication: No difficulties  Cognition Arousal/Alertness: Awake/alert Behavior During Therapy: WFL for tasks assessed/performed Overall Cognitive Status: Within Functional Limits for tasks assessed  General Comments General comments (skin integrity, edema, etc.): BLE noted    Exercises        Assessment/Plan    PT Assessment Patient needs continued PT services  PT Diagnosis Difficulty walking;Generalized weakness   PT Problem List Decreased strength;Decreased activity tolerance;Decreased mobility;Obesity;Decreased knowledge of precautions;Decreased safety awareness;Decreased  knowledge of use of DME  PT Treatment Interventions DME instruction;Gait training;Functional mobility training;Therapeutic activities;Therapeutic exercise;Patient/family education   PT Goals (Current goals can be found in the Care Plan section) Acute Rehab PT Goals Patient Stated Goal: home when medically ready PT Goal Formulation: With patient Time For Goal Achievement: 04/13/16 Potential to Achieve Goals: Good    Frequency Min 3X/week   Barriers to discharge Decreased caregiver support      Co-evaluation PT/OT/SLP Co-Evaluation/Treatment: Yes Reason for Co-Treatment: For patient/therapist safety PT goals addressed during session: Mobility/safety with mobility OT goals addressed during session: ADL's and self-care       End of Session   Activity Tolerance: Patient tolerated treatment well Patient left: with call bell/phone within reach;with chair alarm set Nurse Communication: Mobility status         Time: [id number] PT Time Calculation (min) (ACUTE ONLY): 28 min   Charges:   PT Evaluation $PT Eval Moderate Complexity: 1 Procedure     PT G CodesRada Hay:        Bryleigh Ottaway Elizabeth 03/30/2016, 12:47 PM Blanchard KelchKaren Aerith Canal PT [phone]

## 2016-03-30 NOTE — Progress Notes (Signed)
Pt HS CBG 414. NP, K. Schorr notified at 2156. Orders received. Will continue to monitor.

## 2016-03-30 NOTE — Progress Notes (Signed)
Pt seen and given his scheduled nebulizer treatment without incident.  Pt tolerated well.  Pt is awake, alert, watching tv, no respiratory distress noted or voiced by pt at this time.  Bipap not indicated but remains in room on standby.  Pt asked that I come back around 2300 to place him on nocturnal cpap/bipap.  RT will return at that time per the patient's request.

## 2016-03-31 ENCOUNTER — Inpatient Hospital Stay (HOSPITAL_COMMUNITY): Payer: Medicare Other

## 2016-03-31 DIAGNOSIS — I4819 Other persistent atrial fibrillation: Secondary | ICD-10-CM

## 2016-03-31 DIAGNOSIS — I5033 Acute on chronic diastolic (congestive) heart failure: Secondary | ICD-10-CM

## 2016-03-31 DIAGNOSIS — R7989 Other specified abnormal findings of blood chemistry: Secondary | ICD-10-CM

## 2016-03-31 DIAGNOSIS — N179 Acute kidney failure, unspecified: Secondary | ICD-10-CM

## 2016-03-31 DIAGNOSIS — E875 Hyperkalemia: Secondary | ICD-10-CM

## 2016-03-31 LAB — GLUCOSE, CAPILLARY
Glucose-Capillary: 414 mg/dL — ABNORMAL HIGH (ref 65–99)
Glucose-Capillary: 267 mg/dL — ABNORMAL HIGH (ref 65–99)
Glucose-Capillary: 266 mg/dL — ABNORMAL HIGH (ref 65–99)
Glucose-Capillary: 230 mg/dL — ABNORMAL HIGH (ref 65–99)

## 2016-03-31 LAB — CBC
HCT: 36.5 % — ABNORMAL LOW (ref 39.0–52.0)
HEMOGLOBIN: 12 g/dL — AB (ref 13.0–17.0)
MCH: 28.4 pg (ref 26.0–34.0)
MCHC: 32.9 g/dL (ref 30.0–36.0)
MCV: 86.3 fL (ref 78.0–100.0)
PLATELETS: 210 10*3/uL (ref 150–400)
RBC: 4.23 MIL/uL (ref 4.22–5.81)
RDW: 16.2 % — ABNORMAL HIGH (ref 11.5–15.5)
WBC: 16.8 10*3/uL — ABNORMAL HIGH (ref 4.0–10.5)

## 2016-03-31 LAB — BASIC METABOLIC PANEL
Anion gap: 7 (ref 5–15)
BUN: 49 mg/dL — ABNORMAL HIGH (ref 6–20)
CHLORIDE: 103 mmol/L (ref 101–111)
CO2: 28 mmol/L (ref 22–32)
CREATININE: 1.65 mg/dL — AB (ref 0.61–1.24)
Calcium: 8.3 mg/dL — ABNORMAL LOW (ref 8.9–10.3)
GFR calc non Af Amer: 49 mL/min — ABNORMAL LOW (ref >60)
GFR, EST AFRICAN AMERICAN: 56 mL/min — AB (ref >60)
Glucose, Bld: 316 mg/dL — ABNORMAL HIGH (ref 65–99)
Potassium: 5.1 mmol/L (ref 3.5–5.1)
Sodium: 138 mmol/L (ref 135–145)

## 2016-03-31 LAB — LEGIONELLA PNEUMOPHILA SEROGP 1 UR AG: L. pneumophila Serogp 1 Ur Ag: NEGATIVE

## 2016-03-31 LAB — UREA NITROGEN, URINE: Urea Nitrogen, Ur: 615 mg/dL

## 2016-03-31 LAB — HEMOGLOBIN A1C
HEMOGLOBIN A1C: 9.2 % — AB (ref 4.8–5.6)
MEAN PLASMA GLUCOSE: 217 mg/dL

## 2016-03-31 MED ORDER — INSULIN ASPART PROT & ASPART (70-30 MIX) 100 UNIT/ML ~~LOC~~ SUSP
90.0000 [IU] | Freq: Two times a day (BID) | SUBCUTANEOUS | Status: DC
  Administered 2016-03-31 – 2016-04-01 (×4): 90 [IU] via SUBCUTANEOUS

## 2016-03-31 MED ORDER — INSULIN ASPART 100 UNIT/ML ~~LOC~~ SOLN
0.0000 [IU] | Freq: Three times a day (TID) | SUBCUTANEOUS | Status: DC
  Administered 2016-03-31 (×3): 11 [IU] via SUBCUTANEOUS
  Administered 2016-04-01 (×2): 3 [IU] via SUBCUTANEOUS
  Administered 2016-04-02 – 2016-04-03 (×3): 4 [IU] via SUBCUTANEOUS
  Administered 2016-04-03: 3 [IU] via SUBCUTANEOUS

## 2016-03-31 MED ORDER — LIVING BETTER WITH HEART FAILURE BOOK: Freq: Once | Status: DC

## 2016-03-31 MED ORDER — FUROSEMIDE 10 MG/ML IJ SOLN
40.0000 mg | Freq: Once | INTRAMUSCULAR | Status: AC
  Administered 2016-03-31: 40 mg via INTRAVENOUS
  Filled 2016-03-31: qty 4

## 2016-03-31 MED ORDER — MAGNESIUM SULFATE IN D5W 1-5 GM/100ML-% IV SOLN
1.0000 g | Freq: Once | INTRAVENOUS | Status: AC
  Administered 2016-03-31: 1 g via INTRAVENOUS
  Filled 2016-03-31: qty 100

## 2016-03-31 MED ORDER — INSULIN ASPART 100 UNIT/ML ~~LOC~~ SOLN
0.0000 [IU] | Freq: Every day | SUBCUTANEOUS | Status: DC
  Administered 2016-03-31 – 2016-04-02 (×2): 2 [IU] via SUBCUTANEOUS

## 2016-03-31 MED ORDER — DILTIAZEM HCL 60 MG PO TABS
60.0000 mg | ORAL_TABLET | Freq: Four times a day (QID) | ORAL | Status: DC
  Administered 2016-03-31 – 2016-04-03 (×9): 60 mg via ORAL
  Filled 2016-03-31 (×10): qty 1

## 2016-03-31 MED ORDER — FUROSEMIDE 10 MG/ML IJ SOLN
60.0000 mg | Freq: Three times a day (TID) | INTRAMUSCULAR | Status: DC
  Administered 2016-03-31 – 2016-04-01 (×4): 60 mg via INTRAVENOUS
  Filled 2016-03-31 (×4): qty 6

## 2016-03-31 NOTE — Progress Notes (Signed)
PROGRESS NOTE                                                                                                                                                                                                             Patient Demographics:    [name], is a 45 y.o. male, DOB - [birth year], WUJ:[id number]  Admit date - 03/29/2016   Admitting Physician Lorretta Harp, MD  Outpatient Primary MD for the patient is Emeterio Reeve, MD  LOS - 2  Chief Complaint  Patient presents with  . Shortness of Breath  . Weakness       Brief Narrative    [name] is a 45 y.o. male with medical history significant of hypertension, hyperlipidemia, diabetes mellitus, asthma, depression, anxiety, diastolic congestive heart failure, CKD-II, morbid obesity, atrial flutter/A. fib on Xarelto, chronic low-back pain, OSA on CPAP, who presents with fever, cough, shortness breath, chest pain.  Patient reports that he has been having subjective fever in the past 2 or 3 weeks, he did not measure his temperature at home. He also has dry cough, shortness of breath and chest pain. His chest is located in the frontal chest, constant, moderate, radiating to the back. It is pleruritic, aggravated by coughing or deep breath. No tenderness over calf areas. Patient does not have nausea, vomiting, abdominal pain, diarrhea, symptoms of UTI or unilateral weakness. Patient states that his body weight is stable but has worsening leg edema.  ED Course: pt was found to have WBC 15.8, lactate 1.81-->2.2-->1.2, BNP 540.2, poc trop 0.10, temperature normal, tachycardia, tachypnea, oxygen desaturation to 82% on room air, worsening renal function. Patient is admitted to stepdown as inpatient for further eval and treatment.   # CXR showed asymmetric lung process with diffuse opacity throughout the right lung, also present in October of 2016. The opacity appears a little more  prominent and patchy today but the difference could be technical. Today's study is limited due to patient body habitus and poor penetration.     Subjective:    [name] today has, No headache, No chest pain, No abdominal pain - No Nausea, No new weakness tingling or numbness, Mild dry cough, improved shortness of breath and orthopnea.   Assessment  & Plan :     1. Acute on chronic hypoxic respiratory failure. This is due  to combination of acute on chronic diastolic CHF last EF 60%, obesity hypoventilation syndrome, possible community-acquired pneumonia.   He does not have any signs of asthma exacerbation, he does have small amounts of dry cough but no fevers, he does have orthopnea and fluid/weight gain in the last few days after having a large pizza. Elevated BNP. At this time I have stopped all IV fluids, he is not septic, his lactate was elevated due to hypoxia, he is nontoxic appearing, does not have fever and has chronic leukocytosis going back 8 months in his chart.    A. CHF - As above last EF from echocardiogram previously done was 60% this likely is acute on chronic diastolic CHF, present echo has very poor windows due to body habitus. Patient has been diuresed with IV Lasix to good effect, he is 2 L negative so far, on fluid and salt restriction, on beta blocker, cardiology following, since creatinine has bumped I have cut down his Lasix from 60 IV twice a day to 40 daily. Shortness of breath is better we'll continue to monitor.  B. CAP - chest x-ray could be asymmetrical edema, however for now continue Rocephin and azithromycin until cultures are back. Leukocytosis is chronic, he is afebrile and has only a dry cough. We'll taper down antibiotics fast based on cultures. Clinically improved.  C. OHS/OSA with morbid obesity - CPAP at night, his home machine has broken and we will request case management to assist him with the same, weight loss per PCP.    2. Mild elevation of  troponin in non-ACS pattern. Trend is flat. EKG is nonacute, check echocardiogram to evaluate wall motion and EF, he is chest pain-free, he is already on xaralto, statin and beta blocker will continue for secondary prevention. No need to add aspirin.  3. Mild ARF CK D stage III. Baseline creatinine close to 1.3. Lasix dose reduced, have requested renal to assist as fluid management could be difficult in this patient.  4. History of asthma. Stable. No wheezing stop steroids.  5. History of atrial flutter. [place] 2 score [place] at least 3. On xaralto, digoxin,  beta blocker and amiodarone which will be continued. Currently appears to be in sinus tachycardia. Cardiology following.  6. Essential hypertension. Continue combination of beta blocker, Norvasc and monitor.  7. Dyslipidemia. On statin.  8.  Hyperkalemia. Resolved after Lasix and Kayexalate.   9. History of depression and anxiety. Continue Depakote, Lamictal, Wellbutrin and Xanax combination. No acute issues.   10. DM2 - have increased 70/30 dose and sliding scale, he is currently on combination of 70/30 along with pre-meal NovoLog and high-dose sliding scale. Will monitor CBGs. He is eating nonstop has been counseled.    Lab Results  Component Value Date   HGBA1C 9.2* 03/30/2016   CBG (last 3)   Recent Labs  03/30/16 1153 03/30/16 1512 03/30/16 2154  GLUCAP 461* 522* 414*      Family Communication  :  None present  Code Status :  Full  Diet : Heart Healthy - Low Carb  Disposition Plan  :  Stepdown  Consults  :  Cards  Procedures  :    Echocardiogram - Very poor echo windows, even with definity contrast. I cannot reliably estimate LV function although it may be reduced.  DVT Prophylaxis  : Xaralto  Lab Results  Component Value Date   PLT 210 03/31/2016    Inpatient Medications  Scheduled Meds: . amiodarone  200 mg Oral Daily  .  amLODipine  10 mg Oral Daily  . atorvastatin  20 mg Oral q1800  .  azithromycin  500 mg Intravenous Q24H  . buPROPion  300 mg Oral Daily  . cefTRIAXone (ROCEPHIN)  IV  1 g Intravenous Q24H  . dextromethorphan-guaiFENesin  1 tablet Oral BID  . digoxin  0.25 mg Oral Daily  . diltiazem  240 mg Oral Daily  . divalproex  1,000 mg Oral Daily  . furosemide  40 mg Intravenous Once  . insulin aspart  0-20 Units Subcutaneous TID WC  . insulin aspart  0-5 Units Subcutaneous QHS  . insulin aspart  5 Units Subcutaneous TID WC  . insulin aspart protamine- aspart  40 Units Subcutaneous QAC lunch  . insulin aspart protamine- aspart  90 Units Subcutaneous BID AC  . ipratropium  0.5 mg Nebulization TID  . lamoTRIgine  25 mg Oral QHS  . levalbuterol  1.25 mg Nebulization TID  . Living Better with Heart Failure Book   Does not apply Once  . metoprolol tartrate  25 mg Oral BID  . rivaroxaban  20 mg Oral Q breakfast   Continuous Infusions:  PRN Meds:.acetaminophen, levalbuterol, loratadine, morphine injection, nitroGLYCERIN, ondansetron (ZOFRAN) IV, oxyCODONE  Antibiotics  :    Anti-infectives    Start     Dose/Rate Route Frequency Ordered Stop   03/30/16 2000  azithromycin (ZITHROMAX) 500 mg in dextrose 5 % 250 mL IVPB     500 mg 250 mL/hr over 60 Minutes Intravenous Every 24 hours 03/29/16 2109 04/06/16 1959   03/30/16 1800  cefTRIAXone (ROCEPHIN) 1 g in dextrose 5 % 50 mL IVPB     1 g 100 mL/hr over 30 Minutes Intravenous Every 24 hours 03/29/16 2109 04/06/16 1759   03/29/16 2115  cefTRIAXone (ROCEPHIN) 1 g in dextrose 5 % 50 mL IVPB  Status:  Discontinued     1 g 100 mL/hr over 30 Minutes Intravenous Every 24 hours 03/29/16 2108 03/29/16 2253   03/29/16 1800  cefTRIAXone (ROCEPHIN) 1 g in dextrose 5 % 50 mL IVPB     1 g 100 mL/hr over 30 Minutes Intravenous  Once 03/29/16 1748 03/29/16 2152   03/29/16 1800  azithromycin (ZITHROMAX) 500 mg in dextrose 5 % 250 mL IVPB     500 mg 250 mL/hr over 60 Minutes Intravenous  Once 03/29/16 1748 03/29/16 1900           Objective:   Filed Vitals:   03/30/16 2332 03/31/16 0000 03/31/16 0800 03/31/16 0831  BP: 116/70 123/70 127/108   Pulse: 68 73 81   Temp: 98.1 F (36.7 C) 98.1 F (36.7 C) 97 F (36.1 C)   TempSrc:  Core (Comment)    Resp: Height:      Weight:    282.591 kg (623 lb)  SpO2: 98% 97% 91%     Wt Readings from Last 3 Encounters:  03/31/16 282.591 kg (623 lb)  07/26/15 276.694 kg (610 lb)  05/10/15 280.595 kg (618 lb 9.6 oz)     Intake/Output Summary (Last 24 hours) at 03/31/16 0950 Last data filed at 03/31/16 0000  Gross per 24 hour  Intake    975 ml  Output   2600 ml  Net  -1625 ml     Physical Exam  Morbidly obese, Awake Alert, Oriented X 3, No new F.N deficits, Normal affect [place].AT,PERRAL Supple Neck,No JVD, No cervical lymphadenopathy appriciated.  Symmetrical Chest wall movement, Distant breath sounds, could not appreciate  rales Distant heart sounds, No Parasternal Heave +ve B.Sounds, Abd Soft, No tenderness, No organomegaly appriciated, No rebound - guarding or rigidity. No Cyanosis, Clubbing, 1+ edema, No new Rash or bruise       Data Review:    CBC  Recent Labs Lab 03/29/16 1725 03/29/16 2155 03/31/16 0315  WBC 15.8* 15.1* 16.8*  HGB 12.1* 12.0* 12.0*  HCT 36.7* 36.0* 36.5*  PLT 191 194 210  MCV 84.6 84.3 86.3  MCH 27.9 28.1 28.4  MCHC 33.0 33.3 32.9  RDW 15.7* 15.9* 16.2*  LYMPHSABS 2.1 1.0  --   MONOABS 1.3* 0.6  --   EOSABS 0.0 0.0  --   BASOSABS 0.0 0.0  --     Chemistries   Recent Labs Lab 03/29/16 1725 03/29/16 2152 03/30/16 0512 03/30/16 1230 03/31/16 0315  NA 138 138 138  --  138  K 4.3 4.6 5.8* 5.5* 5.1  CL 106 105 107  --  103  CO2 25 26 24   --  28  GLUCOSE 303* 301* 423*  --  316*  BUN 32* 32* 38*  --  49*  CREATININE 1.65* 1.51* 1.49*  --  1.65*  CALCIUM 8.2* 8.3* 8.2*  --  8.3*  MG 1.9  --   --   --   --   AST 20 20  --   --   --   ALT 12* 13*  --   --   --   ALKPHOS 51 54  --   --   --   BILITOT  1.6* 1.5*  --   --   --    ------------------------------------------------------------------------------------------------------------------  Recent Labs  03/30/16 0512  CHOL 144  HDL 36*  LDLCALC 85  TRIG 161116  CHOLHDL 4.0    Lab Results  Component Value Date   HGBA1C 9.2* 03/30/2016   ------------------------------------------------------------------------------------------------------------------ No results for input(s): TSH, T4TOTAL, T3FREE, THYROIDAB in the last 72 hours.  Invalid input(s): FREET3 ------------------------------------------------------------------------------------------------------------------ No results for input(s): VITAMINB12, FOLATE, FERRITIN, TIBC, IRON, RETICCTPCT in the last 72 hours.  Coagulation profile  Recent Labs Lab 03/29/16 2155  INR 1.33    No results for input(s): DDIMER in the last 72 hours.  Cardiac Enzymes  Recent Labs Lab 03/29/16 2155 03/30/16 0049 03/30/16 0512  TROPONINI 0.11* 0.10* 0.06*   ------------------------------------------------------------------------------------------------------------------    Component Value Date/Time   BNP 540.2* 03/29/2016 1725    Micro Results Recent Results (from the past 240 hour(s))  Blood culture (routine x 2)     Status: None (Preliminary result)   Collection Time: 03/29/16  6:37 PM  Result Value Ref Range Status   Specimen Description BLOOD BLOOD LEFT FOREARM  Final   Special Requests BOTTLES DRAWN AEROBIC AND ANAEROBIC 5CC EACH  Final   Culture   Final    NO GROWTH < 24 HOURS [facility]    Report Status PENDING  Incomplete  Blood culture (routine x 2)     Status: None (Preliminary result)   Collection Time: 03/29/16  7:22 PM  Result Value Ref Range Status   Specimen Description BLOOD RIGHT HAND  Final   Special Requests IN PEDIATRIC BOTTLE 3CC  Final   Culture   Final    NO GROWTH < 24 HOURS Performed at [facility]    Report Status  PENDING  Incomplete  MRSA PCR Screening     Status: None   Collection Time: 03/30/16  1:30 AM  Result Value Ref Range Status  MRSA by PCR NEGATIVE NEGATIVE Final    Comment:        The GeneXpert MRSA Assay (FDA approved for NASAL specimens only), is one component of a comprehensive MRSA colonization surveillance program. It is not intended to diagnose MRSA infection nor to guide or monitor treatment for MRSA infections.   Respiratory Panel by PCR     Status: None   Collection Time: 03/30/16  5:02 AM  Result Value Ref Range Status   Adenovirus NOT DETECTED NOT DETECTED Final   Coronavirus 229E NOT DETECTED NOT DETECTED Final   Coronavirus HKU1 NOT DETECTED NOT DETECTED Final   Coronavirus NL63 NOT DETECTED NOT DETECTED Final   Coronavirus OC43 NOT DETECTED NOT DETECTED Final   Metapneumovirus NOT DETECTED NOT DETECTED Final   Rhinovirus / Enterovirus NOT DETECTED NOT DETECTED Final   Influenza A NOT DETECTED NOT DETECTED Final   Influenza A H1 NOT DETECTED NOT DETECTED Final   Influenza A H1 2009 NOT DETECTED NOT DETECTED Final   Influenza A H3 NOT DETECTED NOT DETECTED Final   Influenza B NOT DETECTED NOT DETECTED Final   Parainfluenza Virus 1 NOT DETECTED NOT DETECTED Final   Parainfluenza Virus 2 NOT DETECTED NOT DETECTED Final   Parainfluenza Virus 3 NOT DETECTED NOT DETECTED Final   Parainfluenza Virus 4 NOT DETECTED NOT DETECTED Final   Respiratory Syncytial Virus NOT DETECTED NOT DETECTED Final   Bordetella pertussis NOT DETECTED NOT DETECTED Final   Chlamydophila pneumoniae NOT DETECTED NOT DETECTED Final   Mycoplasma pneumoniae NOT DETECTED NOT DETECTED Final    Comment: Performed at [facility]    Radiology Reports Dg Chest 2 View  03/29/2016  CLINICAL DATA:  Fever. EXAM: CHEST  2 VIEW COMPARISON:  Multiple x-rays since October 2016 FINDINGS: The study is markedly limited due to patient body habitus and poor penetration. The heart, hila, and mediastinum  are unchanged. No pneumothorax identified. The left lung is essentially clear. There is diffuse opacity throughout the right lung which was also present in October of 2016. This opacity appears a little more prominent and patchy but the difference could be technical. IMPRESSION: Asymmetric lung process with diffuse opacity throughout the right lung, also present in October of 2016. The opacity appears a little more prominent and patchy today but the difference could be technical. Today's study is limited due to patient body habitus and poor penetration. Electronically Signed   By: Gerome Sam III M.D   On: 03/29/2016 17:00   Dg Chest Port 1 View  03/30/2016  CLINICAL DATA:  Shortness of breath starting this morning EXAM: PORTABLE CHEST 1 VIEW COMPARISON:  6/28/ 17 FINDINGS: Cardiomediastinal silhouette is stable. Persistent diffuse hazy airspace opacification in right lung suspicious for infiltrate. Less likely asymmetric pulmonary edema. Study is limited by poor inspiration and patient's large body habitus. IMPRESSION: Persistent diffuse hazy airspace opacification in right lung suspicious for infiltrate. Less likely asymmetric pulmonary edema. Study is limited by poor inspiration and patient's large body habitus. Electronically Signed   By: Natasha Mead M.D.   On: 03/30/2016 08:09    Time Spent in minutes  30   Maicy Filip K M.D on 03/31/2016 at 9:50 AM  Between 7am to 7pm - Pager - [phone]  After 7pm go to [email or web address] - password [facility]  [facility] -  Office  [phone]

## 2016-03-31 NOTE — Consult Note (Signed)
Renal Service Consult Note [name]  [name] 03/31/2016 Sol Blazing Requesting Physician:  Dr Candiss Norse  Reason for Consult:  Acute on Chron renal failure HPI: The patient is a 45 y.o. year-old with hx of DM2, HTN,asthma , afib, diast CHF, OHS, morbid obesity comes in with SOB and weakness.  Also chest tight, some fevers and chills, vague and larely +ROS.  No orthop/ PND. CXR showed bilat infiltrates and pt was admitted on 6/28 and treated with IV abx for poss infection, nebs, and IV lasix for possible vol excess.  UOP has been good  w 2.6L yest and 1.9 L today.   Patient describes cough, chest tight, fevers and SOB for 2-3 days.  Taking lasix 40 mg/ day.  Takes nsaids from time to time, not excessive though.     Pt grew up in [place], [place].  Moved to [place] 14 yrs ago, has 2 sisters here, his brother still lives in [place].  DM and HTN x 15 yrs, CHD x 8 yrs, afib the same.  Hospital stays about 2x/ year for the last 7 yrs, mostly PNA and chest pain.  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  He worked in [employer] for [employer], has been on disability since 2011.  Has Medicare and [facility] insurance.  Is preparing for a gastric sleeve procedure, finished psych eval and has nutrition appt soon.  No hx CKD that 's he is aware of.  He was at [facility] from [place] > April '17.  Not sure where he lives now.    Asked to see for creat 1.6 and diast HF, attempting to diurese.    70 hosp admissions in last 7 years: -   2009 -  PNA, ashtma, MO, DM 2010 - chest pain, mild ^trop, aflutter, coumadin intolerance 2010 - decomp CHF, afluter, HNTN 2010-   2011 - CAP, OSA, asthma, +trop, aKI, decomp diast CHF 2011 - CP, atypical, no further card w/u.  Gallstones.  2011 - CP, r/o MI - trop as normal, CXR congestion, EF 55% 2013 - R thigh abscess 2013 - chest pain, ruled out. Chron pain, DM, afib, CHF, OSA, HTN 2015 - DM2 2015 -  CAP, acute hypoxic resp failure 2/16 - acut/ chron resp failure due to asthma exacerbation, uncont DM, afib, OSA, diast CHF 3/16 - L patellar tendon rupture, PAF, ashtma, chron resp fialure 8/16 - chest pain, nonotypical 10/16 - resp failure, intubated, . Then self extubated.  OHA/ OSA, no CHF.    Date  Creat  eGFR Aug '16 1.18- 1.54 53- >60 Oct '16  1.01- 1.26 >60 Jun 28  1.65  49 Jun 29  1.49  55 Jun 30, '17 1.65  49  ROS  denies CP  no joint pain   no HA  no blurry vision  no rash  no diarrhea  no nausea/ vomiting  no dysuria  no difficulty voiding  no change in urine color    Past Medical History  Past Medical History  Diagnosis Date  . CHF (congestive heart failure) (HCC)     EF55-60%  . HTN (hypertension)   . Ptosis   . Atrial flutter ([place])   . Atrial fibrillation ([place])   . Morbid obesity ([place])   . DM2 (diabetes mellitus, type 2) ([place])   . Depression   . Chronic low back pain   . Asthma   . Vision  abnormalities   . Abscess     right thigh  . Sleep apnea   . Chronic respiratory failure (HCC)   . Obesity hypoventilation syndrome (HCC)   . Patellar tendon rupture 12/18/2014  . Cellulitis of thigh 10/12/2011  . CKD (chronic kidney disease), stage II    Past Surgical History  Past Surgical History  Procedure Laterality Date  . Tonsillectomy    . Cholecystectomy    . Tonsillectomy    . Irrigation and debridement abscess  10/13/2011    Procedure: IRRIGATION AND DEBRIDEMENT ABSCESS;  Surgeon: Emelia Loron, MD;  Location: [facility] OR;  Service: General;  Laterality: Right;  Irrigation and debridement right thigh abscess  . Application of wound vac      right thigh abscess  . Breath tek h pylori N/A 10/06/2014    Procedure: BREATH TEK H PYLORI;  Surgeon: Valarie Merino, MD;  Location: [facility];  Service: General;  Laterality: N/A;  . Patellar tendon repair Left 12/21/2014    Procedure: PATELLA TENDON REPAIR;  Surgeon: Sheral Apley, MD;  Location[facility];   Service: Orthopedics;  Laterality: Left;   Family History  Family History  Problem Relation Age of Onset  . Coronary artery disease Mother     64s; father had it in 47s; both deceased in their 73s    . Cancer Mother     liver  . Heart disease Father   . Cancer Father     prostate   Social History  reports that he has never smoked. He does not have any smokeless tobacco history on file. He reports that he drinks about 0.6 oz of alcohol per week. He reports that he does not use illicit drugs. Allergies  Allergies  Allergen Reactions  . Iodine Anaphylaxis and Other (See Comments)    Associated with the shellfish allergy  . Shellfish Allergy Anaphylaxis   Home medications Prior to Admission medications   Medication Sig Start Date End Date Taking? Authorizing Provider  acetaminophen (TYLENOL) 325 MG tablet Take 2 tablets (650 mg total) by mouth every 4 (four) hours as needed for mild pain (temp > 101.5). 07/26/15  Yes Maryruth Bun Rama, MD  digoxin (DIGOX) 0.25 MG tablet Take 0.25 mg by mouth daily.  05/26/10  Yes Historical Provider, MD  furosemide (LASIX) 40 MG tablet Take 2 tablets (80 mg total) by mouth daily. 07/26/15  Yes Christina P Rama, MD  insulin lispro protamine-lispro (HUMALOG MIX 75/25) (75-25) 100 UNIT/ML SUSP injection Inject 40-75 Units into the skin 3 (three) times daily. Take 75 units in the am, Take 40 units at noon and Take 75 units in the pm. 05/26/10  Yes Historical Provider, MD  loratadine (CLARITIN) 10 MG tablet Take 10 mg by mouth daily as needed for allergies.   Yes Historical Provider, MD  oxyCODONE (ROXICODONE) 15 MG immediate release tablet Take 15 mg by mouth every 6 (six) hours as needed for pain.  05/26/10  Yes Historical Provider, MD  potassium chloride SA (K-DUR,KLOR-CON) 20 MEQ tablet Take 1 tablet (20 mEq total) by mouth 3 (three) times daily. Patient taking differently: Take 20 mEq by mouth 2 (two) times daily.  07/26/15  Yes Christina P Rama, MD   albuterol (PROVENTIL) (2.5 MG/3ML) 0.083% nebulizer solution Take 3 mLs (2.5 mg total) by nebulization every 6 (six) hours as needed for wheezing. 07/26/15   Christina P Rama, MD  albuterol (VENTOLIN HFA) 108 (90 BASE) MCG/ACT inhaler Inhale 2 puffs into the lungs every  6 (six) hours as needed. Shortness of breath Patient taking differently: Inhale 2 puffs into the lungs every 6 (six) hours as needed for wheezing or shortness of breath.  03/30/14   Robbie Lis, MD  ALPRAZolam Duanne Moron) 0.5 MG tablet Take 1 tablet (0.5 mg total) by mouth daily as needed for anxiety. Patient not taking: Reported on 03/29/2016 12/23/14   Jonetta Osgood, MD  Amino Acids-Protein Hydrolys (FEEDING SUPPLEMENT, PRO-STAT SUGAR FREE 64,) LIQD Take 30 mLs by mouth 3 (three) times daily between meals. Patient not taking: Reported on 03/29/2016 07/26/15   Venetia Maxon Rama, MD  amiodarone (PACERONE) 200 MG tablet Take 200 mg by mouth daily.    Historical Provider, MD  amLODipine (NORVASC) 10 MG tablet Take 10 mg by mouth daily.    Historical Provider, MD  atorvastatin (LIPITOR) 20 MG tablet TK 1 T PO  QHS 02/02/16   Historical Provider, MD  BREO ELLIPTA 100-25 MCG/INH AEPB INHALE 1 PUFF PO ONCE A DAY 03/21/16   Historical Provider, MD  buPROPion (WELLBUTRIN XL) 300 MG 24 hr tablet Take 300 mg by mouth Daily.  03/13/12   Historical Provider, MD  CARTIA XT 240 MG 24 hr capsule TK 1 C PO QD 03/21/16   Historical Provider, MD  divalproex (DEPAKOTE) 500 MG DR tablet Take 1,000 mg by mouth daily.     Historical Provider, MD  insulin aspart (NOVOLOG) 100 UNIT/ML injection Inject 0-20 Units into the skin 3 (three) times daily with meals. Patient not taking: Reported on 03/29/2016 07/26/15   Venetia Maxon Rama, MD  insulin aspart (NOVOLOG) 100 UNIT/ML injection Inject 0-5 Units into the skin at bedtime. Patient not taking: Reported on 03/29/2016 07/26/15   Venetia Maxon Rama, MD  insulin aspart (NOVOLOG) 100 UNIT/ML injection Inject 8 Units into  the skin 3 (three) times daily with meals. Patient not taking: Reported on 03/29/2016 07/26/15   Venetia Maxon Rama, MD  insulin glargine (LANTUS) 100 UNIT/ML injection Inject 0.37 mLs (37 Units total) into the skin 2 (two) times daily. Patient not taking: Reported on 03/29/2016 07/26/15   Venetia Maxon Rama, MD  lamoTRIgine (LAMICTAL) 25 MG tablet Take 25 mg by mouth at bedtime.     Historical Provider, MD  metoprolol tartrate (LOPRESSOR) 25 MG tablet Take 1 tablet (25 mg total) by mouth 2 (two) times daily. 07/26/15   Venetia Maxon Rama, MD  nitroGLYCERIN (NITROSTAT) 0.4 MG SL tablet Place 0.4 mg under the tongue every 5 (five) minutes as needed for chest pain.     Historical Provider, MD  NOVOLOG FLEXPEN 100 UNIT/ML FlexPen INJ 10 UNITS [place] QAM PRN FOR HIGH GLUCOSE LEVEL 03/21/16   Historical Provider, MD  nystatin (MYCOSTATIN/NYSTOP) 100000 UNIT/GM POWD Apply to affected area TID Patient not taking: Reported on 03/29/2016 07/26/15   Venetia Maxon Rama, MD  rivaroxaban (XARELTO) 20 MG TABS tablet Take 1 tablet (20 mg total) by mouth daily with supper. Patient taking differently: Take 20 mg by mouth daily with breakfast.  06/26/14   Erick Colace, NP   Liver Function Tests  Recent Labs Lab 03/29/16 1725 03/29/16 2152  AST 20 20  ALT 12* 13*  ALKPHOS 51 54  BILITOT 1.6* 1.5*  PROT 7.2 7.4  ALBUMIN 3.1* 3.0*   No results for input(s): LIPASE, AMYLASE in the last 168 hours. CBC  Recent Labs Lab 03/29/16 1725 03/29/16 2155 03/31/16 0315  WBC 15.8* 15.1* 16.8*  NEUTROABS 12.3* 13.4*  --   HGB 12.1* 12.0* 12.0*  HCT 36.7* 36.0* 36.5*  MCV 84.6 84.3 86.3  PLT 191 194 855   Basic Metabolic Panel  Recent Labs Lab 03/29/16 1725 03/29/16 2152 03/30/16 0512 03/30/16 1230 03/31/16 0315  NA 138 138 138  --  138  K 4.3 4.6 5.8* 5.5* 5.1  CL 106 105 107  --  103  CO2 '25 26 24  '$ --  28  GLUCOSE 303* 301* 423*  --  316*  BUN 32* 32* 38*  --  49*  CREATININE 1.65* 1.51* 1.49*  --  1.65*   CALCIUM 8.2* 8.3* 8.2*  --  8.3*   Iron/TIBC/Ferritin/ %Sat No results found for: IRON, TIBC, FERRITIN, IRONPCTSAT  Filed Vitals:   03/31/16 0800 03/31/16 0831 03/31/16 1005 03/31/16 1200  BP: 127/108  120/72   Pulse: 81  84   Temp: 97 F (36.1 C)  97 F (36.1 C) 97.7 F (36.5 C)  TempSrc:    Oral  Resp: 19  22   Height:      Weight:  268.076 kg (591 lb)    SpO2: 91%  94%    Exam Gen markedly obese, not in distress, mostly blind No rash, cyanosis or gangrene Sclera anicteric, throat clear  No jvd or bruits Chest clear bilat no rales or wheezing RRR no MRG Abd soft ntnd +bs markedly obese, mild abd wall edema GU normal male w foley MS no joint effusions or deformity Ext minimal / trace pitting LE edema / no wounds or ulcers Neuro is alert, Ox 3 , nf, gen weak LE's  Na 138 K 5.1 BUN 49 Creat 1.65  CO2 28  CA 8.3  eGFR 49 Alb 3.0  LFT's ok  BNP 540  Trop 0.06 WBC 16k  Hb 12  plt 210 Resp panel PCR - neg UA cloudy, many bact, 6-30 wbc, 0-5 rbc  CXR - looks more like pulm edema than PNA  Date  Creat  eGFR Aug '16 1.18- 1.54 53- >60 Oct '16  1.01- 1.26 >60 Jun 28  1.65  49 Jun 29  1.49  55 Jun 30, '17 1.65  49  Assessment: 1  CKD stage 3 - baseline creat 1.0- 1.5.  Prob due to HTN and/or DM.  2  Pulm infiltrates/ cough/ SOB - looks like pulm edema , agree w diuresis 3  Morbid obesityh 4  DM 5  HTN on Cardizem CD and metoprolol at home  Plan - resume IV lasix 60 mg every 8 hrs, f/u CXR in am. Renal US.   Kelly Splinter MD [employer] pager [phone]    cell [phone] 03/31/2016, 3:00 PM

## 2016-03-31 NOTE — Progress Notes (Signed)
Patient refusing to discontinue Foley catheter. Patient advised about MD concerns and potential risk for infection. Patient verbalized understanding of Foley indications and the association of urinary tract infections. MD made aware and will continue to monitor patient.

## 2016-03-31 NOTE — Progress Notes (Signed)
Patient: [name] / Admit Date: 03/29/2016 / Date of Encounter: 03/31/2016, 8:12 AM   Subjective: Chest pain persists when coughing but says today is the first day he has been able to go longer between pain medicine. SOB improving. I asked him about amlodipine and diltiazem both being on his med list - states he hasn't been on diltiazem for a year.   Objective: Telemetry: atrial fib vs flutter HR 70s Physical Exam: Blood pressure 123/70, pulse 73, temperature 98.1 F (36.7 C), temperature source Core (Comment), resp. rate 17, height 6\' 3"  (1.905 m), weight 610 lb (276.694 kg), SpO2 97 %. General: Well developed, well nourished morbidly obese WM, in no acute distress Head: Normocephalic, atraumatic, sclera non-icteric, no xanthomas, nares are without discharge. Ptosis r eye Neck: JVP difficult to assess given neck habitus Lungs: Clear bilaterally to auscultation without wheezes, rales, or rhonchi. Breathing is unlabored. Heart: RRR distant given habitus S1 S2 without murmurs, rubs, or gallops.  Abdomen: Soft, non-tender, non-distended with normoactive bowel sounds. No rebound/guarding. Extremities: No clubbing or cyanosis. 2+ edema superimposed on very large baseline leg habitus. Distal pedal pulses are 2+ and equal bilaterally. Neuro: Alert and oriented X 3. Moves all extremities spontaneously. Psych:  Responds to questions appropriately with a normal affect.   Intake/Output Summary (Last 24 hours) at 03/31/16 [id number] Last data filed at 03/31/16 0000  Gross per 24 hour  Intake    975 ml  Output   2600 ml  Net  -1625 ml    Inpatient Medications:  . amiodarone  200 mg Oral Daily  . amLODipine  10 mg Oral Daily  . atorvastatin  20 mg Oral q1800  . azithromycin  500 mg Intravenous Q24H  . buPROPion  300 mg Oral Daily  . cefTRIAXone (ROCEPHIN)  IV  1 g Intravenous Q24H  . dextromethorphan-guaiFENesin  1 tablet Oral BID  . digoxin  0.25 mg Oral Daily  . diltiazem  240 mg Oral Daily    . divalproex  1,000 mg Oral Daily  . insulin aspart  0-20 Units Subcutaneous TID WC  . insulin aspart  0-5 Units Subcutaneous QHS  . insulin aspart  5 Units Subcutaneous TID WC  . insulin aspart protamine- aspart  40 Units Subcutaneous QAC lunch  . insulin aspart protamine- aspart  90 Units Subcutaneous BID AC  . ipratropium  0.5 mg Nebulization TID  . lamoTRIgine  25 mg Oral QHS  . levalbuterol  1.25 mg Nebulization TID  . metoprolol tartrate  25 mg Oral BID  . rivaroxaban  20 mg Oral Q breakfast   Infusions:    Labs:  Recent Labs  03/29/16 1725  03/30/16 0512 03/30/16 1230 03/31/16 0315  NA 138  < > 138  --  138  K 4.3  < > 5.8* 5.5* 5.1  CL 106  < > 107  --  103  CO2 25  < > 24  --  28  GLUCOSE 303*  < > 423*  --  316*  BUN 32*  < > 38*  --  49*  CREATININE 1.65*  < > 1.49*  --  1.65*  CALCIUM 8.2*  < > 8.2*  --  8.3*  MG 1.9  --   --   --   --   < > = values in this interval not displayed.  Recent Labs  03/29/16 1725 03/29/16 2152  AST 20 20  ALT 12* 13*  ALKPHOS 51 54  BILITOT 1.6* 1.5*  PROT  7.2 7.4  ALBUMIN 3.1* 3.0*    Recent Labs  03/29/16 1725 03/29/16 2155 03/31/16 0315  WBC 15.8* 15.1* 16.8*  NEUTROABS 12.3* 13.4*  --   HGB 12.1* 12.0* 12.0*  HCT 36.7* 36.0* 36.5*  MCV 84.6 84.3 86.3  PLT 191 194 210    Recent Labs  03/29/16 2155 03/30/16 0049 03/30/16 0512  TROPONINI 0.11* 0.10* 0.06*   Invalid input(s): POCBNP  Recent Labs  03/30/16 0512  HGBA1C 9.2*     Radiology/Studies:  Dg Chest 2 View  03/29/2016  CLINICAL DATA:  Fever. EXAM: CHEST  2 VIEW COMPARISON:  Multiple x-rays since October 2016 FINDINGS: The study is markedly limited due to patient body habitus and poor penetration. The heart, hila, and mediastinum are unchanged. No pneumothorax identified. The left lung is essentially clear. There is diffuse opacity throughout the right lung which was also present in October of 2016. This opacity appears a little more prominent  and patchy but the difference could be technical. IMPRESSION: Asymmetric lung process with diffuse opacity throughout the right lung, also present in October of 2016. The opacity appears a little more prominent and patchy today but the difference could be technical. Today's study is limited due to patient body habitus and poor penetration. Electronically Signed   By: Gerome Sam III M.D   On: 03/29/2016 17:00   Dg Chest Port 1 View  03/30/2016  CLINICAL DATA:  Shortness of breath starting this morning EXAM: PORTABLE CHEST 1 VIEW COMPARISON:  6/28/ 17 FINDINGS: Cardiomediastinal silhouette is stable. Persistent diffuse hazy airspace opacification in right lung suspicious for infiltrate. Less likely asymmetric pulmonary edema. Study is limited by poor inspiration and patient's large body habitus. IMPRESSION: Persistent diffuse hazy airspace opacification in right lung suspicious for infiltrate. Less likely asymmetric pulmonary edema. Study is limited by poor inspiration and patient's large body habitus. Electronically Signed   By: Natasha Mead M.D.   On: 03/30/2016 08:09     Assessment and Plan  24M with super morbid obesity (weight >600lb), chronic diastolic CHF, atypical CP, OSA, persistent atrial fib & flutter (TEE-DCCV of flutter 10/10, seen by EP who recommended amiodarone, in AF/FL 2016), asthma, CKD II, dyslipidemia, HTN, uncontrolled diabetes mellitus (A1C 9.2) who presented with complaints of SOB and CP. Admitted with hypoxic/hypercarbic respiratory failure, lactic acidosis, possible CAP. Was in NSR in 2015 office note, more recently admissions in 2016 was in AF/AFL. Cardiology consulted because of troponin 0.1.  1. Acute on chronic diastolic CHF - will order daily weights, I/O's. His previous weight in 01/2016 was 589 per patient report but he says he's put on weight gradually due to lots of different illnesses going on. Volume status very difficult to ascertain. Further Lasix d/c by IM this AM -  don't see Dr. Doristine Church note yet. Can consider putting back on oral regimen.  2. Chest pain - pleuritic in nature. Likely r/t PNA. Not tachycardic. Already on anticoagulation.  3. Elevated troponin suspected due to demand ischemia: Troponin mildly elevated but not in ACS pattern. Likely 2/2 pneumonia and acute heart failure.Not a candidate presently for further ischemic workup due to weight. Continue BB/statin. No ASA since he's also on Xarelto.  4. Persistent atrial fib/flutter - rates remain well controlled on present regimen. I suspect it would be near impossible to maintain NSR given his body habitus. Check digoxin level in AM. Will review meds with Dr. Duke Salvia - consider d/c of amiodarone given persistent nature of fib/flutter; also, he is on both  amlodipine & diltiazem.  5. Super morbid obesity - he is undergoing the workup for bariatric surgery, just completed first few requirements. He says the surgeon will not perform the surgery until he shows he can get back down to 590.  6. AKI on CKD stage II with hyperkalemia - baseline Cr last year was 1-1.4; now closer to 1.4-1.6. Would avoid ACEI/ARB due to recent high potassium. See above.  Signed, Ronie Spiesayna Holger Sokolowski PA-C Pager: [phone]

## 2016-04-01 ENCOUNTER — Inpatient Hospital Stay (HOSPITAL_COMMUNITY): Payer: Medicare Other

## 2016-04-01 LAB — PHOSPHORUS: PHOSPHORUS: 3.7 mg/dL (ref 2.5–4.6)

## 2016-04-01 LAB — BASIC METABOLIC PANEL
Anion gap: 6 (ref 5–15)
BUN: 40 mg/dL — AB (ref 6–20)
CALCIUM: 8.3 mg/dL — AB (ref 8.9–10.3)
CO2: 34 mmol/L — ABNORMAL HIGH (ref 22–32)
CREATININE: 1.35 mg/dL — AB (ref 0.61–1.24)
Chloride: 101 mmol/L (ref 101–111)
GFR calc non Af Amer: >60 mL/min (ref >60)
Glucose, Bld: 94 mg/dL (ref 65–99)
Potassium: 4.3 mmol/L (ref 3.5–5.1)
SODIUM: 141 mmol/L (ref 135–145)

## 2016-04-01 LAB — MAGNESIUM
MAGNESIUM: 2.1 mg/dL (ref 1.7–2.4)
Magnesium: 2.2 mg/dL (ref 1.7–2.4)

## 2016-04-01 LAB — GLUCOSE, CAPILLARY
Glucose-Capillary: 107 mg/dL — ABNORMAL HIGH (ref 65–99)
Glucose-Capillary: 52 mg/dL — ABNORMAL LOW (ref 65–99)
Glucose-Capillary: 77 mg/dL (ref 65–99)

## 2016-04-01 LAB — POTASSIUM: POTASSIUM: 4.3 mmol/L (ref 3.5–5.1)

## 2016-04-01 LAB — DIGOXIN LEVEL

## 2016-04-01 MED ORDER — FUROSEMIDE 10 MG/ML IJ SOLN
40.0000 mg | Freq: Two times a day (BID) | INTRAMUSCULAR | Status: DC
  Administered 2016-04-02 (×2): 40 mg via INTRAVENOUS
  Filled 2016-04-01 (×2): qty 4

## 2016-04-01 MED ORDER — HYDRALAZINE HCL 50 MG PO TABS
50.0000 mg | ORAL_TABLET | Freq: Three times a day (TID) | ORAL | Status: DC
  Administered 2016-04-01: 50 mg via ORAL
  Filled 2016-04-01: qty 1

## 2016-04-01 NOTE — Progress Notes (Signed)
Pt began having frequent R on T's on tele monitor around 2200. NP notified, Mg++, K+, and Phos labs ordered and drawn at 2310.  At MN pt ceased having frequent R on T's and labs came back normal.  Will continue to monitor. Barnett HatterKallam, Tuesday Terlecki P

## 2016-04-01 NOTE — Progress Notes (Signed)
[facility] Progress Note   Subjective: 6 L diuresis yesterday, last BP was in 80's, no new c/o's, creat down 1.3  Filed Vitals:   04/01/16 1124 04/01/16 1200 04/01/16 1300 04/01/16 1400  BP: 134/77 81/61    Pulse: 59 62 61 64  Temp:  97 F (36.1 C) 97.2 F (36.2 C) 97.2 F (36.2 C)  TempSrc:      Resp: '20 18 11 24  '$ Height:      Weight:      SpO2: 95% 94% 97% 96%    Inpatient medications: . amiodarone  200 mg Oral Daily  . atorvastatin  20 mg Oral q1800  . buPROPion  300 mg Oral Daily  . dextromethorphan-guaiFENesin  1 tablet Oral BID  . digoxin  0.25 mg Oral Daily  . diltiazem  60 mg Oral Q6H  . divalproex  1,000 mg Oral Daily  . furosemide  40 mg Intravenous Q12H  . hydrALAZINE  50 mg Oral Q8H  . insulin aspart  0-20 Units Subcutaneous TID WC  . insulin aspart  0-5 Units Subcutaneous QHS  . insulin aspart  5 Units Subcutaneous TID WC  . insulin aspart protamine- aspart  40 Units Subcutaneous QAC lunch  . insulin aspart protamine- aspart  90 Units Subcutaneous BID AC  . ipratropium  0.5 mg Nebulization TID  . lamoTRIgine  25 mg Oral QHS  . levalbuterol  1.25 mg Nebulization TID  . Living Better with Heart Failure Book   Does not apply Once  . metoprolol tartrate  25 mg Oral BID  . rivaroxaban  20 mg Oral Q breakfast     acetaminophen, levalbuterol, loratadine, morphine injection, nitroGLYCERIN, ondansetron (ZOFRAN) IV, oxyCODONE  Exam: Gen markedly obese, not in distress, mostly blind No rash, cyanosis or gangrene Sclera anicteric, throat clear  No jvd or bruits Chest clear bilat no rales or wheezing RRR no MRG Abd soft ntnd +bs markedly obese, mild abd wall edema GU normal male w foley MS no joint effusions or deformity Ext minimal / trace pitting LE edema / no wounds or ulcers Neuro is alert, Ox 3 , nf, gen weak LE's  Na 138 K 5.1 BUN 49 Creat 1.65 CO2 28 CA 8.3 eGFR 49 Alb 3.0 LFT's ok BNP 540 Trop 0.06 WBC 16k Hb 12 plt  210 Resp panel PCR - neg UA cloudy, many bact, 6-30 wbc, 0-5 rbc Renal US - normal  CXR - looks more like pulm edema than PNA  DateCreateGFR Aug '161.18- 1.5453- >60 Oct '16 1.01- 1.26>60 Jun 281.6549 Jun 291.4955 Jun 30, '171.6549  Assessment: 1 CKD stage 3 - baseline creat 1.0- 1.5, due to HTN and DM. Creat down w diuresis 2 Pulm infiltrates- this is pulm edema, diuresed well w IV lasix but BP dropped from rapid fluid loss; improved but not gone by cxr today 3 Morbid obesity 4 DM 5 HTN on Cardizem CD and metoprolol at home  Plan - reduce lasix 40 IV q 12 hr, continue diurese as tolerated by BP.  No other suggestions, will sign off.   Kelly Splinter MD [facility] pager [phone]    cell [phone] 04/01/2016, 5:07 PM    Recent Labs Lab 03/30/16 0512  03/31/16 0315 03/31/16 2306 04/01/16 0702  NA 138  --  138  --  141  K 5.8*  < > 5.1 4.3 4.3  CL 107  --  103  --  101  CO2 24  --  28  --  34*  GLUCOSE 423*  --  316*  --  94  BUN 38*  --  49*  --  40*  CREATININE 1.49*  --  1.65*  --  1.35*  CALCIUM 8.2*  --  8.3*  --  8.3*  PHOS  --   --   --  3.7  --   < > = values in this interval not displayed.  Recent Labs Lab 03/29/16 1725 03/29/16 2152  AST 20 20  ALT 12* 13*  ALKPHOS 51 54  BILITOT 1.6* 1.5*  PROT 7.2 7.4  ALBUMIN 3.1* 3.0*    Recent Labs Lab 03/29/16 1725 03/29/16 2155 03/31/16 0315  WBC 15.8* 15.1* 16.8*  NEUTROABS 12.3* 13.4*  --   HGB 12.1* 12.0* 12.0*  HCT 36.7* 36.0* 36.5*  MCV 84.6 84.3 86.3  PLT 191 194 210   Iron/TIBC/Ferritin/ %Sat No results found for: IRON, TIBC, FERRITIN, IRONPCTSAT

## 2016-04-01 NOTE — Progress Notes (Signed)
Found PT off BiPAP at approx 1140- per RN PT stated he was not able to go to sleep at this time. PT placed back on 5 lpm Reserve.

## 2016-04-01 NOTE — Progress Notes (Signed)
Chart reviewed and discussed with Dr. Thedore MinsSingh. Creatinine is improving - renal is involved, now back on diuretics. Digoxin level is undetectable. Also on amiodarone. No plans for ischemia work-up for troponin elevation.  Cardiology will sign-off. Call with questions. Follow-up with Dr. Jens Somrenshaw.  Chrystie NoseKenneth C. Hilty, MD, [facility] Attending Cardiologist [facility]

## 2016-04-01 NOTE — Progress Notes (Signed)
PT placed on BiPAP (V60 at approx 1115 per PT request)- sleepy. RN aware.

## 2016-04-01 NOTE — Progress Notes (Signed)
PROGRESS NOTE                                                                                                                                                                                                             Patient Demographics:    [name], is a 45 y.o. male, DOB - 10/24/1970, FAO:[id number]  Admit date - 03/29/2016   Admitting Physician Lorretta HarpXilin Niu, MD  Outpatient Primary MD for the patient is Emeterio ReeveWOLTERS,SHARON A, MD  LOS - 3  Chief Complaint  Patient presents with  . Shortness of Breath  . Weakness       Brief Narrative    [name] is a 45 y.o. male with medical history significant of hypertension, hyperlipidemia, diabetes mellitus, asthma, depression, anxiety, diastolic congestive heart failure, CKD-II, morbid obesity, atrial flutter/A. fib on Xarelto, chronic low-back pain, OSA on CPAP, who presents with fever, cough, shortness breath, chest pain.  Patient reports that he has been having subjective fever in the past 2 or 3 weeks, he did not measure his temperature at home. He also has dry cough, shortness of breath and chest pain. His chest is located in the frontal chest, constant, moderate, radiating to the back. It is pleruritic, aggravated by coughing or deep breath. No tenderness over calf areas. Patient does not have nausea, vomiting, abdominal pain, diarrhea, symptoms of UTI or unilateral weakness. Patient states that his body weight is stable but has worsening leg edema.  ED Course: pt was found to have WBC 15.8, lactate 1.81-->2.2-->1.2, BNP 540.2, poc trop 0.10, temperature normal, tachycardia, tachypnea, oxygen desaturation to 82% on room air, worsening renal function. Patient is admitted to stepdown as inpatient for further eval and treatment.   # CXR showed asymmetric lung process with diffuse opacity throughout the right lung, also present in October of 2016. The opacity appears a little more  prominent and patchy today but the difference could be technical. Today's study is limited due to patient body habitus and poor penetration.     Subjective:    [name] today has, No headache, No chest pain, No abdominal pain - No Nausea, No new weakness tingling or numbness, Mild dry cough, improved shortness of breath and orthopnea.   Assessment  & Plan :     1. Acute on chronic hypoxic respiratory failure. This is due  to combination of acute on chronic diastolic CHF last EF 60%, obesity hypoventilation syndrome, possible community-acquired pneumonia.   He does not have any signs of asthma exacerbation, he does have small amounts of dry cough but no fevers, he does have orthopnea and fluid/weight gain in the last few days after having a large pizza. Elevated BNP. At this time I have stopped all IV fluids, he is not septic, his lactate was elevated due to hypoxia, he is nontoxic appearing, does not have fever and has chronic leukocytosis going back 8 months in his chart.    A. CHF - As above last EF from echocardiogram previously done was 60% this likely is acute on chronic diastolic CHF, present echo has very poor windows due to body habitus. Patient has been diuresed with IV Lasix to good effect, he is 8 L negative so far, on fluid and salt restriction, on beta blocker, He has been seen by cardiology and renal this admission, clinically much improved continue diuresis.  B. CAP - chest x-ray could be asymmetrical edema, Clinically improved. Has chronic leukocytosis. Has been treated with antibiotics for 3 days with stop.  C. OHS/OSA with morbid obesity - CPAP at night, his home machine has broken and we will request case management to assist him with the same, weight loss per PCP.    2. Mild elevation of troponin in non-ACS pattern. Trend is flat. EKG is nonacute, check echocardiogram to evaluate wall motion and EF, he is chest pain-free, he is already on xaralto, statin and beta blocker  will continue for secondary prevention. No need to add aspirin.  3. Mild ARF CK D stage III. Baseline creatinine close to 1.3. Renal following, renal ultrasound unremarkable, creatinine and renal function improving with diuresis.  4. History of asthma. Stable. No wheezing stop steroids.  5. History of atrial flutter. [place] 2 score [place] at least 3. On xaralto, digoxin,  beta blocker and amiodarone which will be continued. Currently appears to be in sinus tachycardia. Cardiology following.  6. Essential hypertension. Continue combination of beta blocker, Norvasc and monitor.  7. Dyslipidemia. On statin.  8.  Hyperkalemia. Resolved after Lasix and Kayexalate.   9. History of depression and anxiety. Continue Depakote, Lamictal, Wellbutrin and Xanax combination. No acute issues.   10. DM2 - have increased 70/30 dose and sliding scale, he is currently on combination of 70/30 along with pre-meal NovoLog and high-dose sliding scale. Will monitor CBGs. He is eating nonstop has been counseled.    Lab Results  Component Value Date   HGBA1C 9.2* 03/30/2016   CBG (last 3)   Recent Labs  03/31/16 1649 03/31/16 2122 04/01/16 0819  GLUCAP 266* 230* 107*      Family Communication  :  None present  Code Status :  Full  Diet : Heart Healthy - Low Carb  Disposition Plan  :  Stepdown  Consults  :  Cards  Procedures  :    Echocardiogram - Very poor echo windows, even with definity contrast. I cannot reliably estimate LV function although it may be reduced.  DVT Prophylaxis  : Xaralto  Lab Results  Component Value Date   PLT 210 03/31/2016    Inpatient Medications  Scheduled Meds: . amiodarone  200 mg Oral Daily  . atorvastatin  20 mg Oral q1800  . buPROPion  300 mg Oral Daily  . dextromethorphan-guaiFENesin  1 tablet Oral BID  . digoxin  0.25 mg Oral Daily  . diltiazem  60 mg  Oral Q6H  . divalproex  1,000 mg Oral Daily  . furosemide  60 mg Intravenous Q8H  . hydrALAZINE   50 mg Oral Q8H  . insulin aspart  0-20 Units Subcutaneous TID WC  . insulin aspart  0-5 Units Subcutaneous QHS  . insulin aspart  5 Units Subcutaneous TID WC  . insulin aspart protamine- aspart  40 Units Subcutaneous QAC lunch  . insulin aspart protamine- aspart  90 Units Subcutaneous BID AC  . ipratropium  0.5 mg Nebulization TID  . lamoTRIgine  25 mg Oral QHS  . levalbuterol  1.25 mg Nebulization TID  . Living Better with Heart Failure Book   Does not apply Once  . metoprolol tartrate  25 mg Oral BID  . rivaroxaban  20 mg Oral Q breakfast   Continuous Infusions:  PRN Meds:.acetaminophen, levalbuterol, loratadine, morphine injection, nitroGLYCERIN, ondansetron (ZOFRAN) IV, oxyCODONE  Antibiotics  :    Anti-infectives    Start     Dose/Rate Route Frequency Ordered Stop   03/30/16 2000  azithromycin (ZITHROMAX) 500 mg in dextrose 5 % 250 mL IVPB  Status:  Discontinued     500 mg 250 mL/hr over 60 Minutes Intravenous Every 24 hours 03/29/16 2109 04/01/16 0950   03/30/16 1800  cefTRIAXone (ROCEPHIN) 1 g in dextrose 5 % 50 mL IVPB  Status:  Discontinued     1 g 100 mL/hr over 30 Minutes Intravenous Every 24 hours 03/29/16 2109 04/01/16 0950   03/29/16 2115  cefTRIAXone (ROCEPHIN) 1 g in dextrose 5 % 50 mL IVPB  Status:  Discontinued     1 g 100 mL/hr over 30 Minutes Intravenous Every 24 hours 03/29/16 2108 03/29/16 2253   03/29/16 1800  cefTRIAXone (ROCEPHIN) 1 g in dextrose 5 % 50 mL IVPB     1 g 100 mL/hr over 30 Minutes Intravenous  Once 03/29/16 1748 03/29/16 2152   03/29/16 1800  azithromycin (ZITHROMAX) 500 mg in dextrose 5 % 250 mL IVPB     500 mg 250 mL/hr over 60 Minutes Intravenous  Once 03/29/16 1748 03/29/16 1900         Objective:   Filed Vitals:   04/01/16 0400 04/01/16 0500 04/01/16 0700 04/01/16 0800  BP: 156/118   134/77  Pulse: 62  61 64  Temp: 96.8 F (36 C)  96.6 F (35.9 C) 97 F (36.1 C)  TempSrc: Core (Comment)     Resp: 13  21 18   Height:        Weight:  278 kg (612 lb 14.1 oz)    SpO2: 97%  99% 100%    Wt Readings from Last 3 Encounters:  04/01/16 278 kg (612 lb 14.1 oz)  07/26/15 276.694 kg (610 lb)  05/10/15 280.595 kg (618 lb 9.6 oz)     Intake/Output Summary (Last 24 hours) at 04/01/16 0951 Last data filed at 04/01/16 0600  Gross per 24 hour  Intake    780 ml  Output   5150 ml  Net  -4370 ml     Physical Exam  Morbidly obese, Awake Alert, Oriented X 3, No new F.N deficits, Normal affect [place].AT,PERRAL Supple Neck,No JVD, No cervical lymphadenopathy appriciated.  Symmetrical Chest wall movement, Distant breath sounds, could not appreciate rales Distant heart sounds, No Parasternal Heave +ve B.Sounds, Abd Soft, No tenderness, No organomegaly appriciated, No rebound - guarding or rigidity. No Cyanosis, Clubbing, 1+ edema, No new Rash or bruise       Data Review:  CBC  Recent Labs Lab 03/29/16 1725 03/29/16 2155 03/31/16 0315  WBC 15.8* 15.1* 16.8*  HGB 12.1* 12.0* 12.0*  HCT 36.7* 36.0* 36.5*  PLT 191 194 210  MCV 84.6 84.3 86.3  MCH 27.9 28.1 28.4  MCHC 33.0 33.3 32.9  RDW 15.7* 15.9* 16.2*  LYMPHSABS 2.1 1.0  --   MONOABS 1.3* 0.6  --   EOSABS 0.0 0.0  --   BASOSABS 0.0 0.0  --     Chemistries   Recent Labs Lab 03/29/16 1725 03/29/16 2152 03/30/16 0512 03/30/16 1230 03/31/16 0315 03/31/16 2306 04/01/16 0702  NA 138 138 138  --  138  --  141  K 4.3 4.6 5.8* 5.5* 5.1 4.3 4.3  CL 106 105 107  --  103  --  101  CO2 25 26 24   --  28  --  34*  GLUCOSE 303* 301* 423*  --  316*  --  94  BUN 32* 32* 38*  --  49*  --  40*  CREATININE 1.65* 1.51* 1.49*  --  1.65*  --  1.35*  CALCIUM 8.2* 8.3* 8.2*  --  8.3*  --  8.3*  MG 1.9  --   --   --   --  2.2 2.1  AST 20 20  --   --   --   --   --   ALT 12* 13*  --   --   --   --   --   ALKPHOS 51 54  --   --   --   --   --   BILITOT 1.6* 1.5*  --   --   --   --   --     ------------------------------------------------------------------------------------------------------------------  Recent Labs  03/30/16 0512  CHOL 144  HDL 36*  LDLCALC 85  TRIG 161  CHOLHDL 4.0    Lab Results  Component Value Date   HGBA1C 9.2* 03/30/2016   ------------------------------------------------------------------------------------------------------------------ No results for input(s): TSH, T4TOTAL, T3FREE, THYROIDAB in the last 72 hours.  Invalid input(s): FREET3 ------------------------------------------------------------------------------------------------------------------ No results for input(s): VITAMINB12, FOLATE, FERRITIN, TIBC, IRON, RETICCTPCT in the last 72 hours.  Coagulation profile  Recent Labs Lab 03/29/16 2155  INR 1.33    No results for input(s): DDIMER in the last 72 hours.  Cardiac Enzymes  Recent Labs Lab 03/29/16 2155 03/30/16 0049 03/30/16 0512  TROPONINI 0.11* 0.10* 0.06*   ------------------------------------------------------------------------------------------------------------------    Component Value Date/Time   BNP 540.2* 03/29/2016 1725    Micro Results Recent Results (from the past 240 hour(s))  Blood culture (routine x 2)     Status: None (Preliminary result)   Collection Time: 03/29/16  6:37 PM  Result Value Ref Range Status   Specimen Description BLOOD BLOOD LEFT FOREARM  Final   Special Requests BOTTLES DRAWN AEROBIC AND ANAEROBIC 5CC EACH  Final   Culture   Final    NO GROWTH 2 DAYS Performed at [facility]    Report Status PENDING  Incomplete  Blood culture (routine x 2)     Status: None (Preliminary result)   Collection Time: 03/29/16  7:22 PM  Result Value Ref Range Status   Specimen Description BLOOD RIGHT HAND  Final   Special Requests IN PEDIATRIC BOTTLE 3CC  Final   Culture   Final    NO GROWTH 2 DAYS Performed at [facility]    Report Status PENDING  Incomplete  MRSA PCR  Screening     Status:  None   Collection Time: 03/30/16  1:30 AM  Result Value Ref Range Status   MRSA by PCR NEGATIVE NEGATIVE Final    Comment:        The GeneXpert MRSA Assay (FDA approved for NASAL specimens only), is one component of a comprehensive MRSA colonization surveillance program. It is not intended to diagnose MRSA infection nor to guide or monitor treatment for MRSA infections.   Respiratory Panel by PCR     Status: None   Collection Time: 03/30/16  5:02 AM  Result Value Ref Range Status   Adenovirus NOT DETECTED NOT DETECTED Final   Coronavirus 229E NOT DETECTED NOT DETECTED Final   Coronavirus HKU1 NOT DETECTED NOT DETECTED Final   Coronavirus NL63 NOT DETECTED NOT DETECTED Final   Coronavirus OC43 NOT DETECTED NOT DETECTED Final   Metapneumovirus NOT DETECTED NOT DETECTED Final   Rhinovirus / Enterovirus NOT DETECTED NOT DETECTED Final   Influenza A NOT DETECTED NOT DETECTED Final   Influenza A H1 NOT DETECTED NOT DETECTED Final   Influenza A H1 2009 NOT DETECTED NOT DETECTED Final   Influenza A H3 NOT DETECTED NOT DETECTED Final   Influenza B NOT DETECTED NOT DETECTED Final   Parainfluenza Virus 1 NOT DETECTED NOT DETECTED Final   Parainfluenza Virus 2 NOT DETECTED NOT DETECTED Final   Parainfluenza Virus 3 NOT DETECTED NOT DETECTED Final   Parainfluenza Virus 4 NOT DETECTED NOT DETECTED Final   Respiratory Syncytial Virus NOT DETECTED NOT DETECTED Final   Bordetella pertussis NOT DETECTED NOT DETECTED Final   Chlamydophila pneumoniae NOT DETECTED NOT DETECTED Final   Mycoplasma pneumoniae NOT DETECTED NOT DETECTED Final    Comment: Performed at [facility]    Radiology Reports Dg Chest 2 View  03/29/2016  CLINICAL DATA:  Fever. EXAM: CHEST  2 VIEW COMPARISON:  Multiple x-rays since October 2016 FINDINGS: The study is markedly limited due to patient body habitus and poor penetration. The heart, hila, and mediastinum are unchanged. No pneumothorax  identified. The left lung is essentially clear. There is diffuse opacity throughout the right lung which was also present in October of 2016. This opacity appears a little more prominent and patchy but the difference could be technical. IMPRESSION: Asymmetric lung process with diffuse opacity throughout the right lung, also present in October of 2016. The opacity appears a little more prominent and patchy today but the difference could be technical. Today's study is limited due to patient body habitus and poor penetration. Electronically Signed   By: Gerome Sam III M.D   On: 03/29/2016 17:00   US Renal  03/31/2016  CLINICAL DATA:  Acute kidney injury. EXAM: RENAL / URINARY TRACT ULTRASOUND COMPLETE COMPARISON:  12/30/2009 FINDINGS: Right Kidney: Length: 13.6 cm. Echogenicity within normal limits. No mass or hydronephrosis visualized. Left Kidney: Length: 13.9 cm. Echogenicity within normal limits. No mass or hydronephrosis visualized. Bladder: Foley catheter is seen within urinary bladder which is empty. IMPRESSION: Negative.  No evidence of hydronephrosis. Electronically Signed   By: Myles Rosenthal M.D.   On: 03/31/2016 18:18   Dg Chest Port 1 View  03/30/2016  CLINICAL DATA:  Shortness of breath starting this morning EXAM: PORTABLE CHEST 1 VIEW COMPARISON:  6/28/ 17 FINDINGS: Cardiomediastinal silhouette is stable. Persistent diffuse hazy airspace opacification in right lung suspicious for infiltrate. Less likely asymmetric pulmonary edema. Study is limited by poor inspiration and patient's large body habitus. IMPRESSION: Persistent diffuse hazy airspace opacification in right lung suspicious for infiltrate.  Less likely asymmetric pulmonary edema. Study is limited by poor inspiration and patient's large body habitus. Electronically Signed   By: Natasha Mead M.D.   On: 03/30/2016 08:09    Time Spent in minutes  30   SINGH,PRASHANT K M.D on 04/01/2016 at 9:51 AM  Between 7am to 7pm - Pager -  [phone]  After 7pm go to [email or web address] - password [facility]  [facility] -  Office  [phone]

## 2016-04-02 LAB — GLUCOSE, CAPILLARY
GLUCOSE-CAPILLARY: 136 mg/dL — AB (ref 65–99)
GLUCOSE-CAPILLARY: 177 mg/dL — AB (ref 65–99)
GLUCOSE-CAPILLARY: 95 mg/dL (ref 65–99)
Glucose-Capillary: 125 mg/dL — ABNORMAL HIGH (ref 65–99)
Glucose-Capillary: 56 mg/dL — ABNORMAL LOW (ref 65–99)
Glucose-Capillary: 143 mg/dL — ABNORMAL HIGH (ref 65–99)
Glucose-Capillary: 119 mg/dL — ABNORMAL HIGH (ref 65–99)
Glucose-Capillary: 176 mg/dL — ABNORMAL HIGH (ref 65–99)
Glucose-Capillary: 225 mg/dL — ABNORMAL HIGH (ref 65–99)

## 2016-04-02 LAB — BASIC METABOLIC PANEL
Anion gap: 6 (ref 5–15)
BUN: 30 mg/dL — AB (ref 6–20)
CO2: 35 mmol/L — ABNORMAL HIGH (ref 22–32)
CREATININE: 1.04 mg/dL (ref 0.61–1.24)
Calcium: 8.2 mg/dL — ABNORMAL LOW (ref 8.9–10.3)
Chloride: 102 mmol/L (ref 101–111)
GFR calc Af Amer: >60 mL/min (ref >60)
Glucose, Bld: 116 mg/dL — ABNORMAL HIGH (ref 65–99)
Potassium: 3.7 mmol/L (ref 3.5–5.1)
SODIUM: 143 mmol/L (ref 135–145)

## 2016-04-02 MED ORDER — INSULIN ASPART PROT & ASPART (70-30 MIX) 100 UNIT/ML ~~LOC~~ SUSP
75.0000 [IU] | Freq: Two times a day (BID) | SUBCUTANEOUS | Status: DC
  Administered 2016-04-02 – 2016-04-03 (×3): 75 [IU] via SUBCUTANEOUS

## 2016-04-02 NOTE — Progress Notes (Signed)
Hypoglycemic Event  CBG: 52 Treatment: Orange juice, diabetic snack--protein sandwich  Symptoms: feels shakey  Follow-up CBG: 0021 CBG Result:95  Possible Reasons for Event: previous insulin agent   Comments/MD notified:    [name], [name]

## 2016-04-02 NOTE — Progress Notes (Signed)
**Note Evan-Identified via Obfuscation** PROGRESS NOTE                                                                                                                                                                                                             Patient Demographics:    [name], is a 45 y.o. male, DOB - [birth year], ZOX:[id number]  Admit date - 03/29/2016   Admitting Physician Lorretta Harp, MD  Outpatient Primary MD for the patient is Emeterio Reeve, MD  LOS - 4  Chief Complaint  Patient presents with  . Shortness of Breath  . Weakness       Brief Narrative    [name] is a 45 y.o. male with medical history significant of hypertension, hyperlipidemia, diabetes mellitus, asthma, depression, anxiety, diastolic congestive heart failure, CKD-II, morbid obesity, atrial flutter/A. fib on Xarelto, chronic low-back pain, OSA on CPAP, who presents with fever, cough, shortness breath, chest pain.  Patient reports that he has been having subjective fever in the past 2 or 3 weeks, he did not measure his temperature at home. He also has dry cough, shortness of breath and chest pain. His chest is located in the frontal chest, constant, moderate, radiating to the back. It is pleruritic, aggravated by coughing or deep breath. No tenderness over calf areas. Patient does not have nausea, vomiting, abdominal pain, diarrhea, symptoms of UTI or unilateral weakness. Patient states that his body weight is stable but has worsening leg edema.  ED Course: pt was found to have WBC 15.8, lactate 1.81-->2.2-->1.2, BNP 540.2, poc trop 0.10, temperature normal, tachycardia, tachypnea, oxygen desaturation to 82% on room air, worsening renal function. Patient is admitted to stepdown as inpatient for further eval and treatment.   # CXR showed asymmetric lung process with diffuse opacity throughout the right lung, also present in October of 2016. The opacity appears a little more  prominent and patchy today but the difference could be technical. Today's study is limited due to patient body habitus and poor penetration.     Subjective:    [name] today has, No headache, No chest pain, No abdominal pain - No Nausea, No new weakness tingling or numbness, Mild dry cough, improved shortness of breath and orthopnea.   Assessment  & Plan :     1. Acute on chronic hypoxic respiratory failure. This is due  to combination of acute on chronic diastolic CHF last EF 60%, obesity hypoventilation syndrome, possible community-acquired pneumonia.   He does not have any signs of asthma exacerbation, he does have small amounts of dry cough but no fevers, he does have orthopnea and fluid/weight gain in the last few days after having a large pizza. Elevated BNP. At this time I have stopped all IV fluids, he is not septic, his lactate was elevated due to hypoxia, he is nontoxic appearing, does not have fever and has chronic leukocytosis going back 8 months in his chart.    A. CHF - As above last EF from echocardiogram previously done was 60% this likely is acute on chronic diastolic CHF, present echo has very poor windows due to body habitus. Patient has been diuresed with IV Lasix to good effect, he is 13 L negative so far, on fluid and salt restriction, on beta blocker, He has been seen by cardiology and renal this admission, clinically much improved continue diuresis. Likely discharge on 04/03/2016. Home health PT ordered.  B. CAP - chest x-ray could be asymmetrical edema, Clinically improved. Has chronic leukocytosis. All antibiotics were stopped on 04/01/2016.  C. OHS/OSA with morbid obesity - CPAP at night, his home machine has broken and we will request case management to assist him with the same, weight loss per PCP.    2. Mild elevation of troponin in non-ACS pattern. Trend is flat. EKG is nonacute, check echocardiogram to evaluate wall motion and EF, he is chest pain-free, he is  already on xaralto, statin and beta blocker will continue for secondary prevention. No need to add aspirin.  3. Mild ARF CK D stage III. Baseline creatinine close to 1.3. Renal following, renal ultrasound unremarkable, ARF resolved with diuresis.  4. History of asthma. Stable. No wheezing stop steroids.  5. History of atrial flutter. [place] 2 score [place] at least 3. On xaralto, digoxin,  beta blocker and amiodarone which will be continued. Currently appears to be in sinus tachycardia. Cardiology following.  6. Essential hypertension. Continue combination of beta blocker and lasix. Norvasc and hydralazine have been discontinued.  7. Dyslipidemia. On statin.  8.  Hyperkalemia. Resolved after Lasix and Kayexalate.   9. History of depression and anxiety. Continue Depakote, Lamictal, Wellbutrin and Xanax combination. No acute issues.   10. DM2 -  on 7030, pre-meal NovoLog along with sliding scale, on 04/01/2016 a.m. CBGs were low hence 7030 dose has been dropped..    Lab Results  Component Value Date   HGBA1C 9.2* 03/30/2016   CBG (last 3)   Recent Labs  04/02/16 0340 04/02/16 0449 04/02/16 0722  GLUCAP 56* 143* 119*      Family Communication  :  None present  Code Status :  Full  Diet : Heart Healthy - Low Carb  Disposition Plan  :  Stepdown  Consults  :  Cards  Procedures  :    Echocardiogram - Very poor echo windows, even with definity contrast. I cannot reliably estimate LV function although it may be reduced.  DVT Prophylaxis  : Xaralto  Lab Results  Component Value Date   PLT 210 03/31/2016    Inpatient Medications  Scheduled Meds: . amiodarone  200 mg Oral Daily  . atorvastatin  20 mg Oral q1800  . buPROPion  300 mg Oral Daily  . dextromethorphan-guaiFENesin  1 tablet Oral BID  . digoxin  0.25 mg Oral Daily  . diltiazem  60 mg Oral Q6H  . divalproex  1,000 mg Oral Daily  . furosemide  40 mg Intravenous BID  . insulin aspart  0-20 Units Subcutaneous  TID WC  . insulin aspart  0-5 Units Subcutaneous QHS  . insulin aspart  5 Units Subcutaneous TID WC  . insulin aspart protamine- aspart  40 Units Subcutaneous QAC lunch  . insulin aspart protamine- aspart  75 Units Subcutaneous BID AC  . ipratropium  0.5 mg Nebulization TID  . lamoTRIgine  25 mg Oral QHS  . levalbuterol  1.25 mg Nebulization TID  . Living Better with Heart Failure Book   Does not apply Once  . metoprolol tartrate  25 mg Oral BID  . rivaroxaban  20 mg Oral Q breakfast   Continuous Infusions:  PRN Meds:.acetaminophen, levalbuterol, loratadine, morphine injection, nitroGLYCERIN, ondansetron (ZOFRAN) IV, oxyCODONE  Antibiotics  :    Anti-infectives    Start     Dose/Rate Route Frequency Ordered Stop   03/30/16 2000  azithromycin (ZITHROMAX) 500 mg in dextrose 5 % 250 mL IVPB  Status:  Discontinued     500 mg 250 mL/hr over 60 Minutes Intravenous Every 24 hours 03/29/16 2109 04/01/16 0950   03/30/16 1800  cefTRIAXone (ROCEPHIN) 1 g in dextrose 5 % 50 mL IVPB  Status:  Discontinued     1 g 100 mL/hr over 30 Minutes Intravenous Every 24 hours 03/29/16 2109 04/01/16 0950   03/29/16 2115  cefTRIAXone (ROCEPHIN) 1 g in dextrose 5 % 50 mL IVPB  Status:  Discontinued     1 g 100 mL/hr over 30 Minutes Intravenous Every 24 hours 03/29/16 2108 03/29/16 2253   03/29/16 1800  cefTRIAXone (ROCEPHIN) 1 g in dextrose 5 % 50 mL IVPB     1 g 100 mL/hr over 30 Minutes Intravenous  Once 03/29/16 1748 03/29/16 2152   03/29/16 1800  azithromycin (ZITHROMAX) 500 mg in dextrose 5 % 250 mL IVPB     500 mg 250 mL/hr over 60 Minutes Intravenous  Once 03/29/16 1748 03/29/16 1900         Objective:   Filed Vitals:   04/01/16 2105 04/01/16 2252 04/02/16 0525 04/02/16 0842  BP:  130/74 145/64   Pulse: 79 64 73   Temp: 97.7 F (36.5 C) 97.7 F (36.5 C) 97.8 F (36.6 C)   TempSrc:  Oral Oral   Resp: 19 20 20    Height:      Weight:   290 kg (639 lb 5.3 oz)   SpO2: 98% 95% 97% 95%     Wt Readings from Last 3 Encounters:  04/02/16 290 kg (639 lb 5.3 oz)  07/26/15 276.694 kg (610 lb)  05/10/15 280.595 kg (618 lb 9.6 oz)     Intake/Output Summary (Last 24 hours) at 04/02/16 0955 Last data filed at 04/02/16 0910  Gross per 24 hour  Intake   1920 ml  Output   5150 ml  Net  -3230 ml     Physical Exam  Morbidly obese, Awake Alert, Oriented X 3, No new F.N deficits, Normal affect [name],PERRAL Supple Neck,No JVD, No cervical lymphadenopathy appriciated.  Symmetrical Chest wall movement, Distant breath sounds, could not appreciate rales Distant heart sounds, No Parasternal Heave +ve B.Sounds, Abd Soft, No tenderness, No organomegaly appriciated, No rebound - guarding or rigidity. No Cyanosis, Clubbing, 1+ edema, No new Rash or bruise       Data Review:    CBC  Recent Labs Lab 03/29/16 1725 03/29/16 2155 03/31/16 0315  WBC 15.8* 15.1* 16.8*  HGB 12.1* 12.0* 12.0*  HCT 36.7* 36.0* 36.5*  PLT 191 194 210  MCV 84.6 84.3 86.3  MCH 27.9 28.1 28.4  MCHC 33.0 33.3 32.9  RDW 15.7* 15.9* 16.2*  LYMPHSABS 2.1 1.0  --   MONOABS 1.3* 0.6  --   EOSABS 0.0 0.0  --   BASOSABS 0.0 0.0  --     Chemistries   Recent Labs Lab 03/29/16 1725 03/29/16 2152 03/30/16 0512 03/30/16 1230 03/31/16 0315 03/31/16 2306 04/01/16 0702 04/02/16 0440  NA 138 138 138  --  138  --  141 143  K 4.3 4.6 5.8* 5.5* 5.1 4.3 4.3 3.7  CL 106 105 107  --  103  --  101 102  CO2 25 26 24   --  28  --  34* 35*  GLUCOSE 303* 301* 423*  --  316*  --  94 116*  BUN 32* 32* 38*  --  49*  --  40* 30*  CREATININE 1.65* 1.51* 1.49*  --  1.65*  --  1.35* 1.04  CALCIUM 8.2* 8.3* 8.2*  --  8.3*  --  8.3* 8.2*  MG 1.9  --   --   --   --  2.2 2.1  --   AST 20 20  --   --   --   --   --   --   ALT 12* 13*  --   --   --   --   --   --   ALKPHOS 51 54  --   --   --   --   --   --   BILITOT 1.6* 1.5*  --   --   --   --   --   --     ------------------------------------------------------------------------------------------------------------------ No results for input(s): CHOL, HDL, LDLCALC, TRIG, CHOLHDL, LDLDIRECT in the last 72 hours.  Lab Results  Component Value Date   HGBA1C 9.2* 03/30/2016   ------------------------------------------------------------------------------------------------------------------ No results for input(s): TSH, T4TOTAL, T3FREE, THYROIDAB in the last 72 hours.  Invalid input(s): FREET3 ------------------------------------------------------------------------------------------------------------------ No results for input(s): VITAMINB12, FOLATE, FERRITIN, TIBC, IRON, RETICCTPCT in the last 72 hours.  Coagulation profile  Recent Labs Lab 03/29/16 2155  INR 1.33    No results for input(s): DDIMER in the last 72 hours.  Cardiac Enzymes  Recent Labs Lab 03/29/16 2155 03/30/16 0049 03/30/16 0512  TROPONINI 0.11* 0.10* 0.06*   ------------------------------------------------------------------------------------------------------------------    Component Value Date/Time   BNP 540.2* 03/29/2016 1725    Micro Results Recent Results (from the past 240 hour(s))  Blood culture (routine x 2)     Status: None (Preliminary result)   Collection Time: 03/29/16  6:37 PM  Result Value Ref Range Status   Specimen Description BLOOD BLOOD LEFT FOREARM  Final   Special Requests BOTTLES DRAWN AEROBIC AND ANAEROBIC 5CC EACH  Final   Culture   Final    NO GROWTH 3 DAYS Performed at [facility]    Report Status PENDING  Incomplete  Blood culture (routine x 2)     Status: None (Preliminary result)   Collection Time: 03/29/16  7:22 PM  Result Value Ref Range Status   Specimen Description BLOOD RIGHT HAND  Final   Special Requests IN PEDIATRIC BOTTLE 3CC  Final   Culture   Final    NO GROWTH 3 DAYS Performed at [facility]    Report Status PENDING  Incomplete  MRSA PCR  Screening     Status:  None   Collection Time: 03/30/16  1:30 AM  Result Value Ref Range Status   MRSA by PCR NEGATIVE NEGATIVE Final    Comment:        The GeneXpert MRSA Assay (FDA approved for NASAL specimens only), is one component of a comprehensive MRSA colonization surveillance program. It is not intended to diagnose MRSA infection nor to guide or monitor treatment for MRSA infections.   Respiratory Panel by PCR     Status: None   Collection Time: 03/30/16  5:02 AM  Result Value Ref Range Status   Adenovirus NOT DETECTED NOT DETECTED Final   Coronavirus 229E NOT DETECTED NOT DETECTED Final   Coronavirus HKU1 NOT DETECTED NOT DETECTED Final   Coronavirus NL63 NOT DETECTED NOT DETECTED Final   Coronavirus OC43 NOT DETECTED NOT DETECTED Final   Metapneumovirus NOT DETECTED NOT DETECTED Final   Rhinovirus / Enterovirus NOT DETECTED NOT DETECTED Final   Influenza A NOT DETECTED NOT DETECTED Final   Influenza A H1 NOT DETECTED NOT DETECTED Final   Influenza A H1 2009 NOT DETECTED NOT DETECTED Final   Influenza A H3 NOT DETECTED NOT DETECTED Final   Influenza B NOT DETECTED NOT DETECTED Final   Parainfluenza Virus 1 NOT DETECTED NOT DETECTED Final   Parainfluenza Virus 2 NOT DETECTED NOT DETECTED Final   Parainfluenza Virus 3 NOT DETECTED NOT DETECTED Final   Parainfluenza Virus 4 NOT DETECTED NOT DETECTED Final   Respiratory Syncytial Virus NOT DETECTED NOT DETECTED Final   Bordetella pertussis NOT DETECTED NOT DETECTED Final   Chlamydophila pneumoniae NOT DETECTED NOT DETECTED Final   Mycoplasma pneumoniae NOT DETECTED NOT DETECTED Final    Comment: Performed at [facility]    Radiology Reports Dg Chest 2 View  03/29/2016  CLINICAL DATA:  Fever. EXAM: CHEST  2 VIEW COMPARISON:  Multiple x-rays since October 2016 FINDINGS: The study is markedly limited due to patient body habitus and poor penetration. The heart, hila, and mediastinum are unchanged. No pneumothorax  identified. The left lung is essentially clear. There is diffuse opacity throughout the right lung which was also present in October of 2016. This opacity appears a little more prominent and patchy but the difference could be technical. IMPRESSION: Asymmetric lung process with diffuse opacity throughout the right lung, also present in October of 2016. The opacity appears a little more prominent and patchy today but the difference could be technical. Today's study is limited due to patient body habitus and poor penetration. Electronically Signed   By: Gerome Sam III M.D   On: 03/29/2016 17:00   US Renal  03/31/2016  CLINICAL DATA:  Acute kidney injury. EXAM: RENAL / URINARY TRACT ULTRASOUND COMPLETE COMPARISON:  12/30/2009 FINDINGS: Right Kidney: Length: 13.6 cm. Echogenicity within normal limits. No mass or hydronephrosis visualized. Left Kidney: Length: 13.9 cm. Echogenicity within normal limits. No mass or hydronephrosis visualized. Bladder: Foley catheter is seen within urinary bladder which is empty. IMPRESSION: Negative.  No evidence of hydronephrosis. Electronically Signed   By: Myles Rosenthal M.D.   On: 03/31/2016 18:18   Dg Chest Port 1 View  04/01/2016  CLINICAL DATA:  45 year old male with weakness and shortness of breath EXAM: PORTABLE CHEST 1 VIEW COMPARISON:  Prior chest x-rays 03/30/2016 FINDINGS: Persistent and more conspicuous patchy airspace opacity in the right upper lung. The right mid and lower lung appear relatively clear in comparison. Pulmonary vascular congestion and hilar prominence similar compared to prior. Stable cardiomegaly. No acute osseous abnormality.  IMPRESSION: 1. Persistent and slightly more conspicuous right upper lobe patchy airspace opacity concerning for pneumonia. Followup PA and lateral chest X-ray is recommended in 3-4 weeks following trial of antibiotic therapy to ensure resolution and exclude underlying malignancy. 2. Relative clearing in the right mid and lower  lung. 3. Persistent cardiomegaly and pulmonary vascular congestion. Electronically Signed   By: Malachy MoanHeath  McCullough M.D.   On: 04/01/2016 12:33   Dg Chest Port 1 View  03/30/2016  CLINICAL DATA:  Shortness of breath starting this morning EXAM: PORTABLE CHEST 1 VIEW COMPARISON:  6/28/ 17 FINDINGS: Cardiomediastinal silhouette is stable. Persistent diffuse hazy airspace opacification in right lung suspicious for infiltrate. Less likely asymmetric pulmonary edema. Study is limited by poor inspiration and patient's large body habitus. IMPRESSION: Persistent diffuse hazy airspace opacification in right lung suspicious for infiltrate. Less likely asymmetric pulmonary edema. Study is limited by poor inspiration and patient's large body habitus. Electronically Signed   By: Natasha MeadLiviu  Pop M.D.   On: 03/30/2016 08:09    Time Spent in minutes  30   Nicci Vaughan K M.D on 04/02/2016 at 9:55 AM  Between 7am to 7pm - Pager - [phone]  After 7pm go to [email or web address] - password [email or web address]  [facility] -  Office  [phone]

## 2016-04-02 NOTE — Progress Notes (Signed)
Hypoglycemic Event  CBG:56  Treatment: sandwich and juice  Symptoms: feels shaky  Follow-up CBG: Time:0430 CBG Result:143  Possible Reasons for Event: previous insulin may need adjustments  Comments/MD notified:    [name], Mikesha Migliaccio R

## 2016-04-03 LAB — GLUCOSE, CAPILLARY
GLUCOSE-CAPILLARY: 298 mg/dL — AB (ref 65–99)
GLUCOSE-CAPILLARY: 172 mg/dL — AB (ref 65–99)
Glucose-Capillary: 130 mg/dL — ABNORMAL HIGH (ref 65–99)

## 2016-04-03 LAB — BASIC METABOLIC PANEL
Anion gap: 5 (ref 5–15)
BUN: 26 mg/dL — AB (ref 6–20)
CHLORIDE: 96 mmol/L — AB (ref 101–111)
CO2: 39 mmol/L — ABNORMAL HIGH (ref 22–32)
CREATININE: 1.14 mg/dL (ref 0.61–1.24)
Calcium: 8.2 mg/dL — ABNORMAL LOW (ref 8.9–10.3)
GFR calc Af Amer: >60 mL/min (ref >60)
GFR calc non Af Amer: >60 mL/min (ref >60)
GLUCOSE: 139 mg/dL — AB (ref 65–99)
Potassium: 4 mmol/L (ref 3.5–5.1)
SODIUM: 140 mmol/L (ref 135–145)

## 2016-04-03 LAB — CULTURE, BLOOD (ROUTINE X 2)
CULTURE: NO GROWTH
Culture: NO GROWTH

## 2016-04-03 MED ORDER — FUROSEMIDE 40 MG PO TABS: 60.0000 mg | ORAL_TABLET | Freq: Two times a day (BID) | ORAL | Status: AC

## 2016-04-03 NOTE — Progress Notes (Signed)
[facility]. In house rep referral given.

## 2016-04-03 NOTE — Discharge Instructions (Signed)
Follow with Primary MD Emeterio ReeveWOLTERS,SHARON A, MD in 7 days   Get CBC, CMP, 2 view Chest X ray checked  by Primary MD or SNF MD in 5-7 days ( we routinely change or add medications that can affect your baseline labs and fluid status, therefore we recommend that you get the mentioned basic workup next visit with your PCP, your PCP may decide not to get them or add new tests based on their clinical decision)   Activity: As tolerated with Full fall precautions use walker/cane & assistance as needed   Disposition Home     Diet:   Heart Healthy Low carb.  Accuchecks 4 times/day, Once in AM empty stomach and then before each meal. Log in all results and show them to your Prim.MD in 3 days. If any glucose reading is under 80 or above 300 call your Prim MD immidiately. Follow Low glucose instructions for glucose under 80 as instructed.   For Heart failure patients - Check your Weight same time everyday, if you gain over 2 pounds, or you develop in leg swelling, experience more shortness of breath or chest pain, call your Primary MD immediately. Follow Cardiac Low Salt Diet and 1.5 lit/day fluid restriction.   On your next visit with your primary care physician please Get Medicines reviewed and adjusted.   Please request your Prim.MD to go over all Hospital Tests and Procedure/Radiological results at the follow up, please get all Hospital records sent to your Prim MD by signing hospital release before you go home.   If you experience worsening of your admission symptoms, develop shortness of breath, life threatening emergency, suicidal or homicidal thoughts you must seek medical attention immediately by calling 911 or calling your MD immediately  if symptoms less severe.  You Must read complete instructions/literature along with all the possible adverse reactions/side effects for all the Medicines you take and that have been prescribed to you. Take any new Medicines after you have completely  understood and accpet all the possible adverse reactions/side effects.   Do not drive, operate heavy machinery, perform activities at heights, swimming or participation in water activities or provide baby sitting services if your were admitted for syncope or siezures until you have seen by Primary MD or a Neurologist and advised to do so again.  Do not drive when taking Pain medications.    Do not take more than prescribed Pain, Sleep and Anxiety Medications  Special Instructions: If you have smoked or chewed Tobacco  in the last 2 yrs please stop smoking, stop any regular Alcohol  and or any Recreational drug use.  Wear Seat belts while driving.   Please note  You were cared for by a hospitalist during your hospital stay. If you have any questions about your discharge medications or the care you received while you were in the hospital after you are discharged, you can call the unit and asked to speak with the hospitalist on call if the hospitalist that took care of you is not available. Once you are discharged, your primary care physician will handle any further medical issues. Please note that NO REFILLS for any discharge medications will be authorized once you are discharged, as it is imperative that you return to your primary care physician (or establish a relationship with a primary care physician if you do not have one) for your aftercare needs so that they can reassess your need for medications and monitor your lab values.

## 2016-04-03 NOTE — Progress Notes (Signed)
OT Note  Patient Details Name: [name] MRN: [id number] DOB: 03/31/1971   Cancelled Treatment:    Noted pt has DC summary. OT checked in with pt and pt stated sister would be assisting with ADL activity  As needed. Pt did not feel he had any OT needs this afternoon prior to DC  [facility] Lazaria Schab[place], [place]OT [phone]  Alba CoryREDDING, [name] 04/03/2016, 3:04 PM

## 2016-04-03 NOTE — Discharge Summary (Signed)
[name] WUJ:[id number] DOB: 06/12/1971 DOA: 03/29/2016  PCP: Emeterio ReeveWOLTERS,SHARON A, MD  Admit date: 03/29/2016  Discharge date: 04/03/2016  Admitted From: Home   Disposition: home   Recommendations for Outpatient Follow-up:   Follow up with PCP in 1-2 weeks  PCP Please obtain BMP/CBC in one week, 2 view CXR (see Discharge instructions)   PCP Please follow up on the following pending results:     Home Health: Home health PT, RN Equipment/Devices: New CPAP machine requested while case manager Discharge Condition: Stable  CODE STATUS: Full   Diet Recommendation: Heart healthy low-carb, 1.5 L daily fluid restriction Consultations: Cards, renal     Chief Complaint  Patient presents with  . Shortness of Breath  . Weakness     Brief history of present illness from the day of admission and additional interim summary    [name] is a 45 y.o. male with medical history significant of hypertension, hyperlipidemia, diabetes mellitus, asthma, depression, anxiety, diastolic congestive heart failure, CKD-II, morbid obesity, atrial flutter/A. fib on Xarelto, chronic low-back pain, OSA on CPAP, who presents with fever, cough, shortness breath, chest pain.  Patient reports that he has been having subjective fever in the past 2 or 3 weeks, he did not measure his temperature at home. He also has dry cough, shortness of breath and chest pain. His chest is located in the frontal chest, constant, moderate, radiating to the back. It is pleruritic, aggravated by coughing or deep breath. No tenderness over calf areas. Patient does not have nausea, vomiting, abdominal pain, diarrhea, symptoms of UTI or unilateral weakness. Patient states that his body weight is stable but has worsening leg edema.  ED Course: pt was found to have WBC  15.8, lactate 1.81-->2.2-->1.2, BNP 540.2, poc trop 0.10, temperature normal, tachycardia, tachypnea, oxygen desaturation to 82% on room air, worsening renal function. Patient is admitted to stepdown as inpatient for further eval and treatment.   # CXR showed asymmetric lung process with diffuse opacity throughout the right lung, also present in October of 2016. The opacity appears a little more prominent and patchy today but the difference could be technical. Today's study is limited due to patient body habitus and poor penetration.  Hospital issues addressed    1. Acute on chronic hypoxic respiratory failure. This was due to combination of acute on chronic diastolic CHF last EF 60% & obesity hypoventilation syndrome.   A. CHF - As above last EF from echocardiogram previously done was 60% this likely is acute on chronic diastolic CHF, present echo has very poor windows due to body habitus. Patient has been diuresed with IV Lasix to good effect, he is 18 L negative so far, and now symptom-free, he has been encouraged to continue fluid and salt restriction, home dose Lasix increased from 80 mg once a day to 60 mg twice a day, continue on beta blocker, was seen by cardiology cleared for discharge. He symptom-free will follow with PCP and primary cardiologist post discharge.  B. CAP - chest x-ray  showed asymmetrical edema, Clinically improved with diuresis, I do not think he had pneumonia. Has chronic leukocytosis. All antibiotics were stopped on 04/01/2016.  C. OHS/OSA with morbid obesity - CPAP at night, his home machine has broken and we will request case management to assist him with the same, weight loss per PCP.    2. Mild elevation of troponin in non-ACS pattern. Trend is flat. EKG is nonacute, check echocardiogram to evaluate wall motion and EF, he is chest pain-free, he is already on xaralto, statin and beta blocker will continue for secondary prevention. No need to add aspirin.  3. Mild ARF  CK D stage III. Baseline creatinine close to 1.3. Renal following, renal ultrasound unremarkable, ARF resolved with diuresis.  4. History of asthma. Stable. No wheezing stop steroids.  5. History of atrial flutter. [place] vasc 2 score of at least 3. On xaralto, digoxin, calcium channel blocker, beta blocker and amiodarone which will be continued. Currently appears to be in sinus tachycardia. Seen by cardiology stable.  6. Essential hypertension. Continue combination of beta blocker, calcium channel blocker and lasix. Norvasc has been discontinued due to borderline low blood pressures.  7. Dyslipidemia. On statin.  8. Hyperkalemia. Resolved after Lasix and Kayexalate.   9. History of depression and anxiety. Continue Depakote, Lamictal, Wellbutrin and Xanax combination. No acute issues.   10. DM2 -poor outpatient control with A1c of 9.2, for now continue home regimen request PCP to monitor glycemic control closely and adjust insulin dose.   Discharge diagnosis     Principal Problem:   Acute on chronic respiratory failure with hypoxia (HCC) Active Problems:   Depression with anxiety   Atrial flutter (HCC)   Obesity hypoventilation syndrome (HCC)   Long term current use of anticoagulant   Morbid obesity with body mass index of 60.0-69.9 in adult (HCC)   HTN (hypertension), benign   Acute on chronic diastolic (congestive) heart failure (HCC)   OSA (obstructive sleep apnea)   Asthma   Asthma exacerbation   DM (diabetes mellitus), type 2, uncontrolled (HCC)   Essential hypertension   CAP (community acquired pneumonia)   Sepsis (HCC)   Chest pain   Elevated troponin   Acute on chronic kidney failure-II   Persistent atrial fibrillation (HCC)   Hyperkalemia   AKI (acute kidney injury) (HCC)    Discharge instructions    Discharge Instructions    Diet - low sodium heart healthy    Complete by:  As directed      Discharge instructions    Complete by:  As directed   Follow with  Primary MD Emeterio Reeve, MD in 7 days   Get CBC, CMP, 2 view Chest X ray checked  by Primary MD or SNF MD in 5-7 days ( we routinely change or add medications that can affect your baseline labs and fluid status, therefore we recommend that you get the mentioned basic workup next visit with your PCP, your PCP may decide not to get them or add new tests based on their clinical decision)   Activity: As tolerated with Full fall precautions use walker/cane & assistance as needed   Disposition Home     Diet:   Heart Healthy Low carb.  Accuchecks 4 times/day, Once in AM empty stomach and then before each meal. Log in all results and show them to your Prim.MD in 3 days. If any glucose reading is under 80 or above 300 call your Prim MD immidiately. Follow Low glucose instructions for glucose under  80 as instructed.   For Heart failure patients - Check your Weight same time everyday, if you gain over 2 pounds, or you develop in leg swelling, experience more shortness of breath or chest pain, call your Primary MD immediately. Follow Cardiac Low Salt Diet and 1.5 lit/day fluid restriction.   On your next visit with your primary care physician please Get Medicines reviewed and adjusted.   Please request your Prim.MD to go over all Hospital Tests and Procedure/Radiological results at the follow up, please get all Hospital records sent to your Prim MD by signing hospital release before you go home.   If you experience worsening of your admission symptoms, develop shortness of breath, life threatening emergency, suicidal or homicidal thoughts you must seek medical attention immediately by calling 911 or calling your MD immediately  if symptoms less severe.  You Must read complete instructions/literature along with all the possible adverse reactions/side effects for all the Medicines you take and that have been prescribed to you. Take any new Medicines after you have completely understood and accpet  all the possible adverse reactions/side effects.   Do not drive, operate heavy machinery, perform activities at heights, swimming or participation in water activities or provide baby sitting services if your were admitted for syncope or siezures until you have seen by Primary MD or a Neurologist and advised to do so again.  Do not drive when taking Pain medications.    Do not take more than prescribed Pain, Sleep and Anxiety Medications  Special Instructions: If you have smoked or chewed Tobacco  in the last 2 yrs please stop smoking, stop any regular Alcohol  and or any Recreational drug use.  Wear Seat belts while driving.   Please note  You were cared for by a hospitalist during your hospital stay. If you have any questions about your discharge medications or the care you received while you were in the hospital after you are discharged, you can call the unit and asked to speak with the hospitalist on call if the hospitalist that took care of you is not available. Once you are discharged, your primary care physician will handle any further medical issues. Please note that NO REFILLS for any discharge medications will be authorized once you are discharged, as it is imperative that you return to your primary care physician (or establish a relationship with a primary care physician if you do not have one) for your aftercare needs so that they can reassess your need for medications and monitor your lab values.     Increase activity slowly    Complete by:  As directed            Discharge Medications     Medication List    STOP taking these medications        amLODipine 10 MG tablet  Commonly known as:  NORVASC      TAKE these medications        acetaminophen 325 MG tablet  Commonly known as:  TYLENOL  Take 2 tablets (650 mg total) by mouth every 4 (four) hours as needed for mild pain (temp > 101.5).     albuterol 108 (90 Base) MCG/ACT inhaler  Commonly known as:  VENTOLIN HFA    Inhale 2 puffs into the lungs every 6 (six) hours as needed. Shortness of breath     albuterol (2.5 MG/3ML) 0.083% nebulizer solution  Commonly known as:  PROVENTIL  Take 3 mLs (2.5 mg total) by nebulization every  6 (six) hours as needed for wheezing.     ALPRAZolam 0.5 MG tablet  Commonly known as:  XANAX  Take 1 tablet (0.5 mg total) by mouth daily as needed for anxiety.     amiodarone 200 MG tablet  Commonly known as:  PACERONE  Take 200 mg by mouth daily.     atorvastatin 20 MG tablet  Commonly known as:  LIPITOR  TK 1 T PO  QHS     BREO ELLIPTA 100-25 MCG/INH Aepb  Generic drug:  fluticasone furoate-vilanterol  INHALE 1 PUFF PO ONCE A DAY     buPROPion 300 MG 24 hr tablet  Commonly known as:  WELLBUTRIN XL  Take 300 mg by mouth Daily.     CARTIA XT 240 MG 24 hr capsule  Generic drug:  diltiazem  TK 1 C PO QD     DIGOX 0.25 MG tablet  Generic drug:  digoxin  Take 0.25 mg by mouth daily.     divalproex 500 MG DR tablet  Commonly known as:  DEPAKOTE  Take 1,000 mg by mouth daily.     feeding supplement (PRO-STAT SUGAR FREE 64) Liqd  Take 30 mLs by mouth 3 (three) times daily between meals.     HUMALOG MIX 75/25 (75-25) 100 UNIT/ML Susp injection  Generic drug:  insulin lispro protamine-lispro  Inject 40-75 Units into the skin 3 (three) times daily. Take 75 units in the am, Take 40 units at noon and Take 75 units in the pm.     insulin aspart 100 UNIT/ML injection  Commonly known as:  novoLOG  Inject 0-20 Units into the skin 3 (three) times daily with meals.     insulin aspart 100 UNIT/ML injection  Commonly known as:  novoLOG  Inject 0-5 Units into the skin at bedtime.     insulin aspart 100 UNIT/ML injection  Commonly known as:  novoLOG  Inject 8 Units into the skin 3 (three) times daily with meals.     NOVOLOG FLEXPEN 100 UNIT/ML FlexPen  Generic drug:  insulin aspart  INJ 10 UNITS [place] QAM PRN FOR HIGH GLUCOSE LEVEL     insulin glargine 100 UNIT/ML  injection  Commonly known as:  LANTUS  Inject 0.37 mLs (37 Units total) into the skin 2 (two) times daily.     lamoTRIgine 25 MG tablet  Commonly known as:  LAMICTAL  Take 25 mg by mouth at bedtime.     loratadine 10 MG tablet  Commonly known as:  CLARITIN  Take 10 mg by mouth daily as needed for allergies.     metoprolol tartrate 25 MG tablet  Commonly known as:  LOPRESSOR  Take 1 tablet (25 mg total) by mouth 2 (two) times daily.     nitroGLYCERIN 0.4 MG SL tablet  Commonly known as:  NITROSTAT  Place 0.4 mg under the tongue every 5 (five) minutes as needed for chest pain.     nystatin 100000 UNIT/GM Powd  Apply to affected area TID     oxyCODONE 15 MG immediate release tablet  Commonly known as:  ROXICODONE  Take 15 mg by mouth every 6 (six) hours as needed for pain.     potassium chloride SA 20 MEQ tablet  Commonly known as:  K-DUR,KLOR-CON  Take 1 tablet (20 mEq total) by mouth 3 (three) times daily.     rivaroxaban 20 MG Tabs tablet  Commonly known as:  XARELTO  Take 1 tablet (20 mg total) by mouth daily with  supper.      ASK your doctor about these medications        furosemide 40 MG tablet  Commonly known as:  LASIX  Take 1 tablet (40 mg total) by mouth 2 (two) times daily.  Ask about: Which instructions should I use?     furosemide 40 MG tablet  Commonly known as:  LASIX  Take 1.5 tablets (60 mg total) by mouth 2 (two) times daily.  Ask about: Which instructions should I use?        Allergies  Allergen Reactions  . Iodine Anaphylaxis and Other (See Comments)    Associated with the shellfish allergy  . Shellfish Allergy Anaphylaxis        Follow-up Information    Follow up with Emeterio Reeve, MD. Schedule an appointment as soon as possible for a visit in 1 week.   Specialty:  Family Medicine   Contact information:   [address] [place] [address] [phone]       Follow up with Olga Millers, MD. Schedule an  appointment as soon as possible for a visit in 1 week.   Specialty:  Cardiology   Contact information:   [address] [place] [address] [phone]        Major procedures and Radiology Reports - PLEASE review detailed and final reports for all details, in brief -   Echocardiogram - Very poor echo windows, even with definity contrast. I cannot reliably estimate LV function although it may be reduced.     Dg Chest 2 View  03/29/2016  CLINICAL DATA:  Fever. EXAM: CHEST  2 VIEW COMPARISON:  Multiple x-rays since October 2016 FINDINGS: The study is markedly limited due to patient body habitus and poor penetration. The heart, hila, and mediastinum are unchanged. No pneumothorax identified. The left lung is essentially clear. There is diffuse opacity throughout the right lung which was also present in October of 2016. This opacity appears a little more prominent and patchy but the difference could be technical. IMPRESSION: Asymmetric lung process with diffuse opacity throughout the right lung, also present in October of 2016. The opacity appears a little more prominent and patchy today but the difference could be technical. Today's study is limited due to patient body habitus and poor penetration. Electronically Signed   By: Gerome Sam III M.D   On: 03/29/2016 17:00   US Renal  03/31/2016  CLINICAL DATA:  Acute kidney injury. EXAM: RENAL / URINARY TRACT ULTRASOUND COMPLETE COMPARISON:  12/30/2009 FINDINGS: Right Kidney: Length: 13.6 cm. Echogenicity within normal limits. No mass or hydronephrosis visualized. Left Kidney: Length: 13.9 cm. Echogenicity within normal limits. No mass or hydronephrosis visualized. Bladder: Foley catheter is seen within urinary bladder which is empty. IMPRESSION: Negative.  No evidence of hydronephrosis. Electronically Signed   By: Myles Rosenthal M.D.   On: 03/31/2016 18:18   Dg Chest Port 1 View  04/01/2016  CLINICAL DATA:  45 year old male with  weakness and shortness of breath EXAM: PORTABLE CHEST 1 VIEW COMPARISON:  Prior chest x-rays 03/30/2016 FINDINGS: Persistent and more conspicuous patchy airspace opacity in the right upper lung. The right mid and lower lung appear relatively clear in comparison. Pulmonary vascular congestion and hilar prominence similar compared to prior. Stable cardiomegaly. No acute osseous abnormality. IMPRESSION: 1. Persistent and slightly more conspicuous right upper lobe patchy airspace opacity concerning for pneumonia. Followup PA and lateral chest X-ray is recommended in 3-4 weeks following trial of antibiotic  therapy to ensure resolution and exclude underlying malignancy. 2. Relative clearing in the right mid and lower lung. 3. Persistent cardiomegaly and pulmonary vascular congestion. Electronically Signed   By: Malachy Moan M.D.   On: 04/01/2016 12:33   Dg Chest Port 1 View  03/30/2016  CLINICAL DATA:  Shortness of breath starting this morning EXAM: PORTABLE CHEST 1 VIEW COMPARISON:  6/28/ 17 FINDINGS: Cardiomediastinal silhouette is stable. Persistent diffuse hazy airspace opacification in right lung suspicious for infiltrate. Less likely asymmetric pulmonary edema. Study is limited by poor inspiration and patient's large body habitus. IMPRESSION: Persistent diffuse hazy airspace opacification in right lung suspicious for infiltrate. Less likely asymmetric pulmonary edema. Study is limited by poor inspiration and patient's large body habitus. Electronically Signed   By: Natasha Mead M.D.   On: 03/30/2016 08:09    Micro Results      Recent Results (from the past 240 hour(s))  Blood culture (routine x 2)     Status: None (Preliminary result)   Collection Time: 03/29/16  6:37 PM  Result Value Ref Range Status   Specimen Description BLOOD BLOOD LEFT FOREARM  Final   Special Requests BOTTLES DRAWN AEROBIC AND ANAEROBIC 5CC EACH  Final   Culture   Final    NO GROWTH 4 DAYS Performed at [facility]     Report Status PENDING  Incomplete  Blood culture (routine x 2)     Status: None (Preliminary result)   Collection Time: 03/29/16  7:22 PM  Result Value Ref Range Status   Specimen Description BLOOD RIGHT HAND  Final   Special Requests IN PEDIATRIC BOTTLE 3CC  Final   Culture   Final    NO GROWTH 4 DAYS Performed at [facility]    Report Status PENDING  Incomplete  MRSA PCR Screening     Status: None   Collection Time: 03/30/16  1:30 AM  Result Value Ref Range Status   MRSA by PCR NEGATIVE NEGATIVE Final    Comment:        The GeneXpert MRSA Assay (FDA approved for NASAL specimens only), is one component of a comprehensive MRSA colonization surveillance program. It is not intended to diagnose MRSA infection nor to guide or monitor treatment for MRSA infections.   Respiratory Panel by PCR     Status: None   Collection Time: 03/30/16  5:02 AM  Result Value Ref Range Status   Adenovirus NOT DETECTED NOT DETECTED Final   Coronavirus 229E NOT DETECTED NOT DETECTED Final   Coronavirus HKU1 NOT DETECTED NOT DETECTED Final   Coronavirus NL63 NOT DETECTED NOT DETECTED Final   Coronavirus OC43 NOT DETECTED NOT DETECTED Final   Metapneumovirus NOT DETECTED NOT DETECTED Final   Rhinovirus / Enterovirus NOT DETECTED NOT DETECTED Final   Influenza A NOT DETECTED NOT DETECTED Final   Influenza A H1 NOT DETECTED NOT DETECTED Final   Influenza A H1 2009 NOT DETECTED NOT DETECTED Final   Influenza A H3 NOT DETECTED NOT DETECTED Final   Influenza B NOT DETECTED NOT DETECTED Final   Parainfluenza Virus 1 NOT DETECTED NOT DETECTED Final   Parainfluenza Virus 2 NOT DETECTED NOT DETECTED Final   Parainfluenza Virus 3 NOT DETECTED NOT DETECTED Final   Parainfluenza Virus 4 NOT DETECTED NOT DETECTED Final   Respiratory Syncytial Virus NOT DETECTED NOT DETECTED Final   Bordetella pertussis NOT DETECTED NOT DETECTED Final   Chlamydophila pneumoniae NOT DETECTED NOT DETECTED Final    Mycoplasma pneumoniae NOT  DETECTED NOT DETECTED Final    Comment: [facility]    Today   Subjective    [name] today has no headache,no chest abdominal pain,no new weakness tingling or numbness, feels much better wants to go home today.     Objective   Blood pressure 105/84, pulse 68, temperature 97.9 F (36.6 C), temperature source Oral, resp. rate 20, height 6\' 3"  (1.905 m), weight 295 kg (650 lb 5.7 oz), SpO2 94 %.   Intake/Output Summary (Last 24 hours) at 04/03/16 0928 Last data filed at 04/03/16 0917  Gross per 24 hour  Intake    942 ml  Output   5775 ml  Net  -4833 ml    Exam Awake Alert, Oriented x 3, No new F.N deficits, Normal affect [place].AT,PERRAL Supple Neck,No JVD, No cervical lymphadenopathy appriciated.  Symmetrical Chest wall movement, Good air movement bilaterally, CTAB RRR,No Gallops,Rubs or new Murmurs, No Parasternal Heave +ve B.Sounds, Abd Soft, Non tender, No organomegaly appriciated, No rebound -guarding or rigidity. No Cyanosis, Clubbing or edema, No new Rash or bruise   Data Review   CBC w Diff: Lab Results  Component Value Date   WBC 16.8* 03/31/2016   HGB 12.0* 03/31/2016   HCT 36.5* 03/31/2016   PLT 210 03/31/2016   LYMPHOPCT 7 03/29/2016   MONOPCT 4 03/29/2016   EOSPCT 0 03/29/2016   BASOPCT 0 03/29/2016    CMP: Lab Results  Component Value Date   NA 140 04/03/2016   K 4.0 04/03/2016   CL 96* 04/03/2016   CO2 39* 04/03/2016   BUN 26* 04/03/2016   CREATININE 1.14 04/03/2016   PROT 7.4 03/29/2016   ALBUMIN 3.0* 03/29/2016   BILITOT 1.5* 03/29/2016   ALKPHOS 54 03/29/2016   AST 20 03/29/2016   ALT 13* 03/29/2016  .   Total Time in preparing paper work, data evaluation and todays exam - 35 minutes  Leroy SeaSINGH,PRASHANT K M.D on 04/03/2016 at 9:28 AM  [facility]   Office  [phone]

## 2016-04-06 ENCOUNTER — Ambulatory Visit: Payer: Medicare Other | Admitting: Dietician

## 2016-04-26 ENCOUNTER — Encounter: Payer: Medicare Other | Attending: Surgery | Admitting: Dietician

## 2016-04-26 ENCOUNTER — Encounter: Payer: Self-pay | Admitting: Dietician

## 2016-04-26 DIAGNOSIS — Z713 Dietary counseling and surveillance: Secondary | ICD-10-CM | POA: Insufficient documentation

## 2016-04-26 DIAGNOSIS — G4733 Obstructive sleep apnea (adult) (pediatric): Secondary | ICD-10-CM | POA: Diagnosis not present

## 2016-04-26 DIAGNOSIS — K219 Gastro-esophageal reflux disease without esophagitis: Secondary | ICD-10-CM | POA: Diagnosis not present

## 2016-04-26 DIAGNOSIS — E109 Type 1 diabetes mellitus without complications: Secondary | ICD-10-CM | POA: Insufficient documentation

## 2016-04-26 DIAGNOSIS — I4891 Unspecified atrial fibrillation: Secondary | ICD-10-CM | POA: Diagnosis not present

## 2016-04-26 DIAGNOSIS — I10 Essential (primary) hypertension: Secondary | ICD-10-CM | POA: Diagnosis not present

## 2016-04-26 DIAGNOSIS — I509 Heart failure, unspecified: Secondary | ICD-10-CM | POA: Diagnosis not present

## 2016-04-26 NOTE — Progress Notes (Signed)
  Pre-Op Assessment Visit:  Pre-Operative Sleeve gastrectomy Surgery  Medical Nutrition Therapy:  Appt start time: 1100   End time:  1145  Patient was seen on 04/26/2016 for Pre-Operative Nutrition Assessment. Assessment and letter of approval faxed to [facility] Program coordinator on 04/26/2016.   Has recently lost a significant amount of weight (states he weighed 619 lbs in June 2017).    Preferred Learning Style:   No preference indicated   Learning Readiness:  Ready  Handouts given during visit include:  Pre-Op Goals Bariatric Surgery Protein Shakes   During the appointment today the following Pre-Op Goals were reviewed with the patient: Maintain or lose weight as instructed by your surgeon Make healthy food choices Begin to limit portion sizes Limited concentrated sugars and fried foods Keep fat/sugar in the single digits per serving on   food labels Practice CHEWING your food  (aim for 30 chews per bite or until applesauce consistency) Practice not drinking 15 minutes before, during, and 30 minutes after each meal/snack Avoid all carbonated beverages  Avoid/limit caffeinated beverages  Avoid all sugar-sweetened beverages Consume 3 meals per day; eat every 3-5 hours Make a list of non-food related activities Aim for 64-100 ounces of FLUID daily  Aim for at least 60-80 grams of PROTEIN daily Look for a liquid protein source that contain ?15 g protein and ?5 g carbohydrate  (ex: shakes, drinks, shots)  Patient-Centered Goals: -Rides and [place] -Walking up/down steps without Field seismologist school and being a Programmer, multimedia -Run a mile without stopping -Able to walk comfortably  Demonstrated degree of understanding via:  Teach Back  Teaching Method Utilized:  Visual Auditory Hands on  Barriers to learning/adherence to lifestyle change: limited mobility  Patient to call the [facility] to enroll  in Pre-Op and Post-Op Nutrition Education when surgery date is scheduled.

## 2016-05-24 ENCOUNTER — Ambulatory Visit: Payer: Self-pay | Admitting: Dietician

## 2016-05-31 ENCOUNTER — Encounter: Payer: Medicare Other | Attending: Surgery | Admitting: Dietician

## 2016-05-31 DIAGNOSIS — I4891 Unspecified atrial fibrillation: Secondary | ICD-10-CM | POA: Insufficient documentation

## 2016-05-31 DIAGNOSIS — K219 Gastro-esophageal reflux disease without esophagitis: Secondary | ICD-10-CM | POA: Insufficient documentation

## 2016-05-31 DIAGNOSIS — E109 Type 1 diabetes mellitus without complications: Secondary | ICD-10-CM | POA: Insufficient documentation

## 2016-05-31 DIAGNOSIS — I509 Heart failure, unspecified: Secondary | ICD-10-CM | POA: Diagnosis not present

## 2016-05-31 DIAGNOSIS — Z713 Dietary counseling and surveillance: Secondary | ICD-10-CM | POA: Diagnosis not present

## 2016-05-31 DIAGNOSIS — G4733 Obstructive sleep apnea (adult) (pediatric): Secondary | ICD-10-CM | POA: Insufficient documentation

## 2016-05-31 DIAGNOSIS — I10 Essential (primary) hypertension: Secondary | ICD-10-CM | POA: Diagnosis not present

## 2016-05-31 DIAGNOSIS — Z6841 Body Mass Index (BMI) 40.0 and over, adult: Secondary | ICD-10-CM

## 2016-05-31 NOTE — Progress Notes (Signed)
  6 Months Supervised Weight Loss Visit:   Pre-Operative Sleeve Gastrectomy Surgery  Medical Nutrition Therapy:  Appt start time: 1040 end time:  1055.  Primary concerns today: Supervised Weight Loss Visit. Returns with a 2 lb weight loss. Trying to eliminate carbs and eating lots of vegetables and fruit. Drinking more water. Working on strengthening his legs each day. Working on chewing well and not drinking during meals.    Has not tried protein shakes yet. Eating 3 meals per day. Has a sandwich at night since he's trying to prevent a low blood sugar in the morning.   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   Goal to be walking with a cane by 07/03/2016.  Weight: 577 lbs  BMI: 72.1 lbs  Preferred Learning Style:   No preference indicated   Learning Readiness:   Ready  24-hr recall: B (AM): grapefruit with Raisin bran and banana Snk (AM): peanut butter crackers L (PM): [place] and cheese sandwich with tomato soup and ritz crackers and granola bar Snk (PM): grapes and greek yogurt  D (PM): chicken with 1 cup of brown rice with broccoli and cauliflower Snk (PM): sandwich or yoplait yogurt  Beverages: water, diet soda, diet green tea, coffee with 4 cream and 8 Equals  Medications: leg exercise  Recent physical activity:  Leg exercises  Progress Towards Goal(s):  In progress.  Handouts given during visit include:  none   Nutritional Diagnosis:  [place]-3.3 Obesity related to past poor dietary habits and physical inactivity as evidenced by patient attending supervised weight loss for insurance approval of bariatric surgery.    Intervention:  Nutrition counseling provided. Plan: Keep working on chewing well and not drinking during (wait 30 minutes after eating before drinking). Continue with exercise. Only have snacks if you are feeling hungry.  Try adding a protein (eggs or meat).   Teaching Method Utilized:  Visual Auditory Hands on  Barriers to learning/adherence to  lifestyle change: limited mobility  Demonstrated degree of understanding via:  Teach Back   Monitoring/Evaluation:  Dietary intake, exercise, and body weight. Follow up in 1 months for 6 month supervised weight loss visit.

## 2016-05-31 NOTE — Patient Instructions (Addendum)
Keep working on chewing well and not drinking during (wait 30 minutes after eating before drinking). Continue with exercise. Only have snacks if you are feeling hungry.  Try adding a protein (eggs or meat).

## 2016-06-09 ENCOUNTER — Ambulatory Visit: Payer: Self-pay | Admitting: Internal Medicine

## 2016-06-14 ENCOUNTER — Telehealth: Payer: Self-pay | Admitting: Pulmonary Disease

## 2016-06-14 NOTE — Telephone Encounter (Signed)
Patient states that his current CPAP machine is broken.  He has not had one in a few months and needs a new machine.  Patient states that he is in "desperate need" of a new CPAP machine and wants to know if Dr. Vassie LollAlva would be willing to go ahead and order a new CPAP machine for him before his OV in October.  Patient has not been seen in our office in 3 years.  Advised him that since he has not been seen in 3 years that insurance may not pay for it if we were to order it without an OV.  He wanted me to ask Dr. Vassie LollAlva anyway and see if he would go ahead and get the order sent so he can get the CPAP machine sooner.    Dr. Vassie LollAlva, please advise.

## 2016-06-14 NOTE — Telephone Encounter (Signed)
Clance patient-was last seen in 07/2014 He is on bilevel- Please arrange office visit with NP -they can discuss with me and we can place order PAP after that please obtain last download

## 2016-06-15 NOTE — Telephone Encounter (Signed)
Patient scheduled to see TP on 06/16/16 at 4pm.  Patient does not have download since his machine has been broken for over a month.    Patient aware to arrive 15 min early for appointment. Nothing further needed.

## 2016-06-16 ENCOUNTER — Ambulatory Visit: Payer: Medicare Other | Admitting: Adult Health

## 2016-06-18 ENCOUNTER — Emergency Department (HOSPITAL_COMMUNITY): Payer: Medicare Other

## 2016-06-18 ENCOUNTER — Inpatient Hospital Stay (HOSPITAL_COMMUNITY)
Admission: EM | Admit: 2016-06-18 | Discharge: 2016-07-02 | DRG: 870 | Disposition: E | Payer: Medicare Other | Attending: Internal Medicine | Admitting: Internal Medicine

## 2016-06-18 DIAGNOSIS — Z91048 Other nonmedicinal substance allergy status: Secondary | ICD-10-CM

## 2016-06-18 DIAGNOSIS — G8929 Other chronic pain: Secondary | ICD-10-CM | POA: Diagnosis present

## 2016-06-18 DIAGNOSIS — E878 Other disorders of electrolyte and fluid balance, not elsewhere classified: Secondary | ICD-10-CM | POA: Diagnosis present

## 2016-06-18 DIAGNOSIS — I5033 Acute on chronic diastolic (congestive) heart failure: Secondary | ICD-10-CM | POA: Diagnosis present

## 2016-06-18 DIAGNOSIS — E874 Mixed disorder of acid-base balance: Secondary | ICD-10-CM | POA: Diagnosis present

## 2016-06-18 DIAGNOSIS — Z6841 Body Mass Index (BMI) 40.0 and over, adult: Secondary | ICD-10-CM | POA: Diagnosis not present

## 2016-06-18 DIAGNOSIS — J9621 Acute and chronic respiratory failure with hypoxia: Secondary | ICD-10-CM | POA: Diagnosis present

## 2016-06-18 DIAGNOSIS — J189 Pneumonia, unspecified organism: Secondary | ICD-10-CM

## 2016-06-18 DIAGNOSIS — I130 Hypertensive heart and chronic kidney disease with heart failure and stage 1 through stage 4 chronic kidney disease, or unspecified chronic kidney disease: Secondary | ICD-10-CM | POA: Diagnosis present

## 2016-06-18 DIAGNOSIS — E1122 Type 2 diabetes mellitus with diabetic chronic kidney disease: Secondary | ICD-10-CM | POA: Diagnosis present

## 2016-06-18 DIAGNOSIS — M545 Low back pain: Secondary | ICD-10-CM | POA: Diagnosis present

## 2016-06-18 DIAGNOSIS — I4891 Unspecified atrial fibrillation: Secondary | ICD-10-CM | POA: Diagnosis present

## 2016-06-18 DIAGNOSIS — E1165 Type 2 diabetes mellitus with hyperglycemia: Secondary | ICD-10-CM | POA: Diagnosis present

## 2016-06-18 DIAGNOSIS — D649 Anemia, unspecified: Secondary | ICD-10-CM | POA: Diagnosis present

## 2016-06-18 DIAGNOSIS — D688 Other specified coagulation defects: Secondary | ICD-10-CM | POA: Diagnosis present

## 2016-06-18 DIAGNOSIS — R6521 Severe sepsis with septic shock: Secondary | ICD-10-CM | POA: Diagnosis present

## 2016-06-18 DIAGNOSIS — E870 Hyperosmolality and hypernatremia: Secondary | ICD-10-CM | POA: Diagnosis present

## 2016-06-18 DIAGNOSIS — J9601 Acute respiratory failure with hypoxia: Secondary | ICD-10-CM

## 2016-06-18 DIAGNOSIS — B952 Enterococcus as the cause of diseases classified elsewhere: Secondary | ICD-10-CM | POA: Diagnosis present

## 2016-06-18 DIAGNOSIS — Z7901 Long term (current) use of anticoagulants: Secondary | ICD-10-CM

## 2016-06-18 DIAGNOSIS — A419 Sepsis, unspecified organism: Secondary | ICD-10-CM | POA: Diagnosis present

## 2016-06-18 DIAGNOSIS — K7200 Acute and subacute hepatic failure without coma: Secondary | ICD-10-CM | POA: Diagnosis present

## 2016-06-18 DIAGNOSIS — J9690 Respiratory failure, unspecified, unspecified whether with hypoxia or hypercapnia: Secondary | ICD-10-CM | POA: Diagnosis present

## 2016-06-18 DIAGNOSIS — Z794 Long term (current) use of insulin: Secondary | ICD-10-CM

## 2016-06-18 DIAGNOSIS — L8990 Pressure ulcer of unspecified site, unspecified stage: Secondary | ICD-10-CM | POA: Insufficient documentation

## 2016-06-18 DIAGNOSIS — R0989 Other specified symptoms and signs involving the circulatory and respiratory systems: Secondary | ICD-10-CM

## 2016-06-18 DIAGNOSIS — E1010 Type 1 diabetes mellitus with ketoacidosis without coma: Secondary | ICD-10-CM

## 2016-06-18 DIAGNOSIS — J9622 Acute and chronic respiratory failure with hypercapnia: Secondary | ICD-10-CM | POA: Diagnosis not present

## 2016-06-18 DIAGNOSIS — K567 Ileus, unspecified: Secondary | ICD-10-CM

## 2016-06-18 DIAGNOSIS — R748 Abnormal levels of other serum enzymes: Secondary | ICD-10-CM

## 2016-06-18 DIAGNOSIS — G9340 Encephalopathy, unspecified: Secondary | ICD-10-CM | POA: Diagnosis present

## 2016-06-18 DIAGNOSIS — K5900 Constipation, unspecified: Secondary | ICD-10-CM | POA: Diagnosis present

## 2016-06-18 DIAGNOSIS — Z9049 Acquired absence of other specified parts of digestive tract: Secondary | ICD-10-CM

## 2016-06-18 DIAGNOSIS — Z4659 Encounter for fitting and adjustment of other gastrointestinal appliance and device: Secondary | ICD-10-CM

## 2016-06-18 DIAGNOSIS — Z79899 Other long term (current) drug therapy: Secondary | ICD-10-CM

## 2016-06-18 DIAGNOSIS — N179 Acute kidney failure, unspecified: Secondary | ICD-10-CM | POA: Diagnosis present

## 2016-06-18 DIAGNOSIS — E662 Morbid (severe) obesity with alveolar hypoventilation: Secondary | ICD-10-CM | POA: Diagnosis present

## 2016-06-18 DIAGNOSIS — I4892 Unspecified atrial flutter: Secondary | ICD-10-CM | POA: Diagnosis present

## 2016-06-18 DIAGNOSIS — J80 Acute respiratory distress syndrome: Secondary | ICD-10-CM | POA: Diagnosis not present

## 2016-06-18 DIAGNOSIS — N182 Chronic kidney disease, stage 2 (mild): Secondary | ICD-10-CM | POA: Diagnosis present

## 2016-06-18 DIAGNOSIS — Z9981 Dependence on supplemental oxygen: Secondary | ICD-10-CM

## 2016-06-18 DIAGNOSIS — R69 Illness, unspecified: Secondary | ICD-10-CM | POA: Diagnosis not present

## 2016-06-18 DIAGNOSIS — J810 Acute pulmonary edema: Secondary | ICD-10-CM | POA: Diagnosis not present

## 2016-06-18 DIAGNOSIS — F329 Major depressive disorder, single episode, unspecified: Secondary | ICD-10-CM | POA: Diagnosis present

## 2016-06-18 DIAGNOSIS — R069 Unspecified abnormalities of breathing: Secondary | ICD-10-CM

## 2016-06-18 DIAGNOSIS — Z8249 Family history of ischemic heart disease and other diseases of the circulatory system: Secondary | ICD-10-CM

## 2016-06-18 DIAGNOSIS — T17990A Other foreign object in respiratory tract, part unspecified in causing asphyxiation, initial encounter: Secondary | ICD-10-CM | POA: Diagnosis present

## 2016-06-18 DIAGNOSIS — I5031 Acute diastolic (congestive) heart failure: Secondary | ICD-10-CM | POA: Diagnosis not present

## 2016-06-18 DIAGNOSIS — Z91013 Allergy to seafood: Secondary | ICD-10-CM

## 2016-06-18 LAB — I-STAT ARTERIAL BLOOD GAS, ED
Acid-base deficit: 4 mmol/L — ABNORMAL HIGH (ref 0.0–2.0)
Acid-base deficit: 7 mmol/L — ABNORMAL HIGH (ref 0.0–2.0)
BICARBONATE: 22 mmol/L (ref 20.0–28.0)
Bicarbonate: 23.1 mmol/L (ref 20.0–28.0)
O2 Saturation: 84 %
O2 Saturation: 85 %
PCO2 ART: 48.5 mmHg — AB (ref 32.0–48.0)
PCO2 ART: 55.5 mmHg — AB (ref 32.0–48.0)
PH ART: 7.206 — AB (ref 7.350–7.450)
PH ART: 7.285 — AB (ref 7.350–7.450)
PO2 ART: 55 mmHg — AB (ref 83.0–108.0)
PO2 ART: 61 mmHg — AB (ref 83.0–108.0)
Patient temperature: 98.6
Patient temperature: 98.6
TCO2: 24 mmol/L (ref 0–100)
TCO2: 25 mmol/L (ref 0–100)

## 2016-06-18 LAB — CBG MONITORING, ED
Glucose-Capillary: >600 mg/dL (ref 65–99)
Glucose-Capillary: >600 mg/dL (ref 65–99)

## 2016-06-18 LAB — CBC WITH DIFFERENTIAL/PLATELET
BASOS PCT: 0 %
Basophils Absolute: 0 10*3/uL (ref 0.0–0.1)
EOS ABS: 0 10*3/uL (ref 0.0–0.7)
EOS PCT: 0 %
HCT: 43.2 % (ref 39.0–52.0)
Hemoglobin: 14.3 g/dL (ref 13.0–17.0)
LYMPHS ABS: 1.9 10*3/uL (ref 0.7–4.0)
Lymphocytes Relative: 10 %
MCH: 27.9 pg (ref 26.0–34.0)
MCHC: 33.1 g/dL (ref 30.0–36.0)
MCV: 84.4 fL (ref 78.0–100.0)
MONO ABS: 1.7 10*3/uL — AB (ref 0.1–1.0)
MONOS PCT: 9 %
NEUTROS PCT: 81 %
Neutro Abs: 15.3 10*3/uL — ABNORMAL HIGH (ref 1.7–7.7)
Platelets: 217 10*3/uL (ref 150–400)
RBC: 5.12 MIL/uL (ref 4.22–5.81)
RDW: 16 % — ABNORMAL HIGH (ref 11.5–15.5)
WBC: 18.9 10*3/uL — ABNORMAL HIGH (ref 4.0–10.5)

## 2016-06-18 LAB — URINALYSIS, ROUTINE W REFLEX MICROSCOPIC
Ketones, ur: 15 mg/dL — AB
Leukocytes, UA: NEGATIVE
Nitrite: NEGATIVE
Protein, ur: NEGATIVE mg/dL
SPECIFIC GRAVITY, URINE: 1.02 (ref 1.005–1.030)
pH: 5 (ref 5.0–8.0)

## 2016-06-18 LAB — COMPREHENSIVE METABOLIC PANEL
ALK PHOS: 128 U/L — AB (ref 38–126)
ALT: 154 U/L — AB (ref 17–63)
AST: 232 U/L — ABNORMAL HIGH (ref 15–41)
Albumin: 3.2 g/dL — ABNORMAL LOW (ref 3.5–5.0)
Anion gap: 17 — ABNORMAL HIGH (ref 5–15)
BILIRUBIN TOTAL: 3.5 mg/dL — AB (ref 0.3–1.2)
BUN: 32 mg/dL — ABNORMAL HIGH (ref 6–20)
CALCIUM: 8.4 mg/dL — AB (ref 8.9–10.3)
CO2: 19 mmol/L — AB (ref 22–32)
CREATININE: 2.42 mg/dL — AB (ref 0.61–1.24)
Chloride: 101 mmol/L (ref 101–111)
GFR calc non Af Amer: 31 mL/min — ABNORMAL LOW (ref >60)
GFR, EST AFRICAN AMERICAN: 36 mL/min — AB (ref >60)
GLUCOSE: 733 mg/dL — AB (ref 65–99)
Potassium: 4.9 mmol/L (ref 3.5–5.1)
SODIUM: 137 mmol/L (ref 135–145)
TOTAL PROTEIN: 7.5 g/dL (ref 6.5–8.1)

## 2016-06-18 LAB — I-STAT CG4 LACTIC ACID, ED
LACTIC ACID, VENOUS: 3.61 mmol/L — AB (ref 0.5–1.9)
Lactic Acid, Venous: 6.99 mmol/L (ref 0.5–1.9)

## 2016-06-18 LAB — URINE MICROSCOPIC-ADD ON: WBC, UA: NONE SEEN WBC/hpf (ref 0–5)

## 2016-06-18 LAB — BRAIN NATRIURETIC PEPTIDE: B Natriuretic Peptide: 542.7 pg/mL — ABNORMAL HIGH (ref 0.0–100.0)

## 2016-06-18 LAB — PROTIME-INR
INR: 1.41
PROTHROMBIN TIME: 17.4 s — AB (ref 11.4–15.2)

## 2016-06-18 LAB — TRIGLYCERIDES: TRIGLYCERIDES: 129 mg/dL (ref <150)

## 2016-06-18 LAB — I-STAT TROPONIN, ED: Troponin i, poc: 0.06 ng/mL (ref 0.00–0.08)

## 2016-06-18 LAB — MAGNESIUM: MAGNESIUM: 1.8 mg/dL (ref 1.7–2.4)

## 2016-06-18 MED ORDER — SODIUM CHLORIDE 0.9 % IV SOLN
INTRAVENOUS | Status: DC
  Administered 2016-06-18: 5.4 [IU]/h via INTRAVENOUS
  Filled 2016-06-18: qty 2.5

## 2016-06-18 MED ORDER — FENTANYL CITRATE (PF) 100 MCG/2ML IJ SOLN
INTRAMUSCULAR | Status: AC
  Administered 2016-06-18: 100 ug
  Filled 2016-06-18: qty 2

## 2016-06-18 MED ORDER — SODIUM CHLORIDE 0.9 % IV SOLN
250.0000 mL | INTRAVENOUS | Status: DC | PRN
  Administered 2016-06-23: 19:00:00 via INTRAVENOUS

## 2016-06-18 MED ORDER — DEXTROSE-NACL 5-0.45 % IV SOLN
INTRAVENOUS | Status: DC
  Administered 2016-06-19: 07:00:00 via INTRAVENOUS

## 2016-06-18 MED ORDER — PROPOFOL 1000 MG/100ML IV EMUL
INTRAVENOUS | Status: AC
  Filled 2016-06-18: qty 100

## 2016-06-18 MED ORDER — NOREPINEPHRINE BITARTRATE 1 MG/ML IV SOLN
0.0000 ug/min | INTRAVENOUS | Status: AC
  Administered 2016-06-18: 2 ug/min via INTRAVENOUS
  Filled 2016-06-18: qty 4

## 2016-06-18 MED ORDER — MIDAZOLAM HCL 2 MG/2ML IJ SOLN
INTRAMUSCULAR | Status: AC
  Filled 2016-06-18: qty 6

## 2016-06-18 MED ORDER — ASPIRIN 300 MG RE SUPP
300.0000 mg | RECTAL | Status: AC
  Administered 2016-06-19: 300 mg via RECTAL
  Filled 2016-06-18: qty 1

## 2016-06-18 MED ORDER — MIDAZOLAM HCL 2 MG/2ML IJ SOLN
INTRAMUSCULAR | Status: DC | PRN
  Administered 2016-06-18 – 2016-06-20 (×2): 2 mg via INTRAVENOUS

## 2016-06-18 MED ORDER — SODIUM CHLORIDE 0.9 % IV BOLUS (SEPSIS)
1000.0000 mL | Freq: Once | INTRAVENOUS | Status: AC
  Administered 2016-06-18: 1000 mL via INTRAVENOUS

## 2016-06-18 MED ORDER — FAMOTIDINE 40 MG/5ML PO SUSR
20.0000 mg | Freq: Two times a day (BID) | ORAL | Status: DC
  Filled 2016-06-18: qty 2.5

## 2016-06-18 MED ORDER — ETOMIDATE 2 MG/ML IV SOLN
INTRAVENOUS | Status: DC | PRN
Start: 2016-06-18 — End: 2016-06-24
  Administered 2016-06-18: 80 mg via INTRAVENOUS

## 2016-06-18 MED ORDER — SODIUM CHLORIDE 0.9 % IV SOLN
INTRAVENOUS | Status: DC
  Administered 2016-06-18: 125 mL/h via INTRAVENOUS

## 2016-06-18 MED ORDER — ASPIRIN 81 MG PO CHEW: 324.0000 mg | CHEWABLE_TABLET | ORAL | Status: AC

## 2016-06-18 MED ORDER — PIPERACILLIN-TAZOBACTAM 3.375 G IVPB
3.3750 g | Freq: Three times a day (TID) | INTRAVENOUS | Status: DC
  Administered 2016-06-19 – 2016-06-25 (×18): 3.375 g via INTRAVENOUS
  Filled 2016-06-18 (×23): qty 50

## 2016-06-18 MED ORDER — VANCOMYCIN HCL 10 G IV SOLR
1250.0000 mg | Freq: Two times a day (BID) | INTRAVENOUS | Status: DC
  Administered 2016-06-19 – 2016-06-21 (×5): 1250 mg via INTRAVENOUS
  Filled 2016-06-18 (×6): qty 1250

## 2016-06-18 MED ORDER — PROPOFOL 1000 MG/100ML IV EMUL
5.0000 ug/kg/min | INTRAVENOUS | Status: AC
  Administered 2016-06-18: 50 ug/kg/min via INTRAVENOUS

## 2016-06-18 MED ORDER — SUCCINYLCHOLINE CHLORIDE 20 MG/ML IJ SOLN
INTRAMUSCULAR | Status: DC | PRN
  Administered 2016-06-18: 300 mg via INTRAVENOUS

## 2016-06-18 MED ORDER — VANCOMYCIN HCL 10 G IV SOLR
2000.0000 mg | Freq: Once | INTRAVENOUS | Status: AC
  Administered 2016-06-18: 2000 mg via INTRAVENOUS
  Filled 2016-06-18: qty 2000

## 2016-06-18 MED ORDER — PIPERACILLIN-TAZOBACTAM 3.375 G IVPB 30 MIN
3.3750 g | Freq: Once | INTRAVENOUS | Status: AC
  Administered 2016-06-18: 3.375 g via INTRAVENOUS
  Filled 2016-06-18: qty 50

## 2016-06-18 MED ORDER — SODIUM CHLORIDE 0.9 % IV BOLUS (SEPSIS)
2000.0000 mL | Freq: Once | INTRAVENOUS | Status: AC
  Administered 2016-06-18: 2000 mL via INTRAVENOUS

## 2016-06-18 NOTE — ED Triage Notes (Signed)
Per EMS - pt from home. Called out for resp distress. Cyanotic upon EMS arrival, SpO2 65% on RA. Placed on CPAP, SpO2 78-80%. No IV access. A&O but lethargic upon arrival.

## 2016-06-18 NOTE — ED Notes (Signed)
CRITICAL VALUE ALERT  Critical value received:  Glucose 733

## 2016-06-18 NOTE — ED Notes (Signed)
Pt cyanotic upon arrival to ED. A&O x 4.

## 2016-06-18 NOTE — ED Provider Notes (Signed)
[facility]-EMERGENCY DEPT Provider Note   CSN: [id number] Arrival date & time: 2016/06/30  1832   History   Chief Complaint Chief Complaint  Patient presents with  . Respiratory Distress    HPI [name] is a 45 y.o. male.  The history is provided by the EMS personnel, the patient, a relative and medical records. The history is limited by the condition of the patient.   45 year old male with history of morbid obesity, CHF, chronic respiratory failure and obesity hypoventilation syndrome, CKD, DM, OSA, depression, atrial fibrillation and flutter, hypertension presenting with respiratory distress. Onset was today. Worsening since then. Severe. EMS was called for respiratory distress and found patient cyanotic with sats in 60s. Placed on BiPAP, with improvement to 80s. Associated with fever, cough, sputum. Patient unable to provide additional history given severity of respiratory distress.    Past Medical History:  Diagnosis Date  . Abscess    right thigh  . Asthma   . Atrial fibrillation (HCC)   . Atrial flutter (HCC)   . Cellulitis of thigh 10/12/2011  . CHF (congestive heart failure) (HCC)    EF55-60%  . Chronic low back pain   . Chronic respiratory failure (HCC)   . CKD (chronic kidney disease), stage II   . Depression   . DM2 (diabetes mellitus, type 2) (HCC)   . HTN (hypertension)   . Morbid obesity (HCC)   . Obesity hypoventilation syndrome (HCC)   . Patellar tendon rupture 12/18/2014  . Ptosis   . Sleep apnea   . Vision abnormalities     Patient Active Problem List   Diagnosis Date Noted  . Respiratory failure (HCC) June 30, 2016  . Persistent atrial fibrillation (HCC) 03/31/2016  . Hyperkalemia 03/31/2016  . AKI (acute kidney injury) (HCC)   . CAP (community acquired pneumonia) 03/29/2016  . Sepsis (HCC) 03/29/2016  . Chest pain 03/29/2016  . Elevated troponin 03/29/2016  . Acute on chronic kidney failure-II 03/29/2016  . Immobility 07/26/2015  . Acute  encephalopathy 07/24/2015  . Hypomagnesemia 07/24/2015  . Diastolic CHF, acute on chronic (HCC) 07/24/2015  . Pressure ulcer 07/24/2015  . Acute on chronic respiratory failure with hypoxia (HCC)   . Healthcare-associated pneumonia   . Hyperosmolar non-ketotic state in patient with type 2 diabetes mellitus (HCC)   . DM (diabetes mellitus), type 2, uncontrolled (HCC)   . Essential hypertension   . Hypoxia 11/26/2014  . Asthma exacerbation 11/26/2014  . PAF (paroxysmal atrial fibrillation) (HCC) 06/15/2014  . Asthma 03/27/2014  . Morbid obesity with body mass index of 60.0-69.9 in adult (HCC) 04/19/2012  . HTN (hypertension), benign 04/19/2012  . Acute on chronic diastolic (congestive) heart failure (HCC) 04/19/2012  . OSA (obstructive sleep apnea) 04/19/2012  . Acute renal failure (HCC) 10/15/2011  . Long term current use of anticoagulant 11/14/2010  . PTOSIS 06/02/2009  . NYSTAGMUS 06/02/2009  . Atrial flutter (HCC) 06/02/2009  . ALLERGIC RHINITIS 06/02/2009  . Obesity hypoventilation syndrome (HCC) 06/02/2009  . Depression with anxiety 06/17/2007    Past Surgical History:  Procedure Laterality Date  . APPLICATION OF WOUND VAC     right thigh abscess  . BREATH TEK H PYLORI N/A 10/06/2014   Procedure: BREATH TEK H PYLORI;  Surgeon: Valarie Merino, MD;  Location: [facility];  Service: General;  Laterality: N/A;  . CHOLECYSTECTOMY    . IRRIGATION AND DEBRIDEMENT ABSCESS  10/13/2011   Procedure: IRRIGATION AND DEBRIDEMENT ABSCESS;  Surgeon: Emelia Loron, MD;  Location: [facility] OR;  Service:  General;  Laterality: Right;  Irrigation and debridement right thigh abscess  . PATELLAR TENDON REPAIR Left 12/21/2014   Procedure: PATELLA TENDON REPAIR;  Surgeon: Sheral Apley, MD;  Location: [facility] OR;  Service: Orthopedics;  Laterality: Left;  . TONSILLECTOMY    . TONSILLECTOMY         Home Medications    Prior to Admission medications   Medication Sig Start Date End Date Taking?  Authorizing Provider  albuterol (VENTOLIN HFA) 108 (90 BASE) MCG/ACT inhaler Inhale 2 puffs into the lungs every 6 (six) hours as needed. Shortness of breath Patient taking differently: Inhale 2 puffs into the lungs every 6 (six) hours as needed for wheezing or shortness of breath.  03/30/14  Yes Alison Murray, MD  amLODipine (NORVASC) 10 MG tablet Take 10 mg by mouth daily. 05/28/16  Yes Historical Provider, MD  atorvastatin (LIPITOR) 20 MG tablet Take 20 mg by mouth daily.   Yes Historical Provider, MD  buPROPion (WELLBUTRIN XL) 300 MG 24 hr tablet Take 300 mg by mouth Daily.  03/13/12  Yes Historical Provider, MD  diltiazem (CARTIA XT) 240 MG 24 hr capsule Take 240 mg by mouth daily.   Yes Historical Provider, MD  divalproex (DEPAKOTE) 500 MG DR tablet Take 1,000 mg by mouth daily.    Yes Historical Provider, MD  fluticasone furoate-vilanterol (BREO ELLIPTA) 100-25 MCG/INH AEPB Inhale 1 puff into the lungs daily.   Yes Historical Provider, MD  furosemide (LASIX) 40 MG tablet Take 1.5 tablets (60 mg total) by mouth 2 (two) times daily. Patient taking differently: Take 40 mg by mouth daily.  04/03/16  Yes Leroy Sea, MD  insulin aspart (NOVOLOG FLEXPEN) 100 UNIT/ML FlexPen Inject 10 Units into the skin daily with breakfast.   Yes Historical Provider, MD  Insulin Detemir (LEVEMIR FLEXTOUCH) 100 UNIT/ML Pen Inject 58 Units into the skin 2 (two) times daily.   Yes Historical Provider, MD  lamoTRIgine (LAMICTAL) 25 MG tablet Take 25 mg by mouth at bedtime.    Yes Historical Provider, MD  levocetirizine (XYZAL) 5 MG tablet Take 5 mg by mouth every evening.   Yes Historical Provider, MD  metoprolol tartrate (LOPRESSOR) 25 MG tablet Take 1 tablet (25 mg total) by mouth 2 (two) times daily. 07/26/15  Yes Maryruth Bun Rama, MD  naproxen sodium (ALEVE) 220 MG tablet Take 440 mg by mouth 2 (two) times daily as needed (pain).   Yes Historical Provider, MD  nitroGLYCERIN (NITROSTAT) 0.4 MG SL tablet Place 0.4  mg under the tongue every 5 (five) minutes as needed for chest pain.    Yes Historical Provider, MD  OXYGEN Inhale into the lungs See admin instructions. Inhale oxygen every night at bedtime, may also use as needed for shortness of breath after exertion   Yes Historical Provider, MD  potassium chloride SA (K-DUR,KLOR-CON) 20 MEQ tablet Take 1 tablet (20 mEq total) by mouth 3 (three) times daily. Patient taking differently: Take 20 mEq by mouth 2 (two) times daily.  07/26/15  Yes Maryruth Bun Rama, MD  rivaroxaban (XARELTO) 20 MG TABS tablet Take 1 tablet (20 mg total) by mouth daily with supper. Patient taking differently: Take 20 mg by mouth daily with breakfast.  06/26/14  Yes Simonne Martinet, NP  acetaminophen (TYLENOL) 325 MG tablet Take 2 tablets (650 mg total) by mouth every 4 (four) hours as needed for mild pain (temp > 101.5). 07/26/15   Maryruth Bun Rama, MD  albuterol (PROVENTIL) (2.5 MG/3ML) 0.083%  nebulizer solution Take 3 mLs (2.5 mg total) by nebulization every 6 (six) hours as needed for wheezing. Patient not taking: Reported on 06/02/2016 07/26/15   Maryruth Bunhristina P Rama, MD  ALPRAZolam Prudy Feeler(XANAX) 0.5 MG tablet Take 1 tablet (0.5 mg total) by mouth daily as needed for anxiety. Patient not taking: Reported on 06/15/2016 12/23/14   Maretta BeesShanker M Ghimire, MD  insulin aspart (NOVOLOG) 100 UNIT/ML injection Inject 0-20 Units into the skin 3 (three) times daily with meals. Patient not taking: Reported on 06/17/2016 07/26/15   Maryruth Bunhristina P Rama, MD  insulin aspart (NOVOLOG) 100 UNIT/ML injection Inject 0-5 Units into the skin at bedtime. Patient not taking: Reported on 06/30/2016 07/26/15   Maryruth Bunhristina P Rama, MD  insulin aspart (NOVOLOG) 100 UNIT/ML injection Inject 8 Units into the skin 3 (three) times daily with meals. Patient not taking: Reported on 06/05/2016 07/26/15   Maryruth Bunhristina P Rama, MD  insulin glargine (LANTUS) 100 UNIT/ML injection Inject 0.37 mLs (37 Units total) into the skin 2 (two) times  daily. Patient not taking: Reported on 06/26/2016 07/26/15   Maryruth Bunhristina P Rama, MD  nystatin (MYCOSTATIN/NYSTOP) 100000 UNIT/GM POWD Apply to affected area TID Patient not taking: Reported on 06/07/2016 07/26/15   Maryruth Bunhristina P Rama, MD    Family History Family History  Problem Relation Age of Onset  . Coronary artery disease Mother     1640s; father had it in 6330s; both deceased in their 6470s    . Cancer Mother     liver  . Heart disease Father   . Cancer Father     prostate    Social History Social History  Substance Use Topics  . Smoking status: Never Smoker  . Smokeless tobacco: Not on file     Comment: no significant tobacco use   . Alcohol use 0.6 oz/week    1 Standard drinks or equivalent per week     Comment: no significant use-- rare     Allergies   Iodine and Shellfish allergy   Review of Systems Review of Systems  Unable to perform ROS: Acuity of condition (Patient able to answer some but not all questions)  Constitutional: Positive for fever.  Respiratory: Positive for cough and shortness of breath.   Cardiovascular: Positive for chest pain.  Gastrointestinal: Negative for abdominal pain and vomiting.  Skin: Negative for rash.    Physical Exam Updated Vital Signs BP 98/70   Pulse 71   Temp 98.8 F (37.1 C) (Axillary)   Resp (!) 0   Ht 6\' 3"  (1.905 m)   Wt (!) 260.8 kg   SpO2 (!) 85%   BMI 71.87 kg/m   Physical Exam  Constitutional: He appears distressed.  Morbidly obese   HENT:  Head: Normocephalic and atraumatic.  Eyes: Conjunctivae are normal.  Neck: Neck supple.  Cardiovascular: Regular rhythm.  Tachycardia present.   Pulmonary/Chest: Tachypnea noted. He is in respiratory distress. He has rhonchi. He has rales.  Abdominal: Soft. There is no tenderness.  Musculoskeletal: He exhibits edema.  Neurological: He is alert.  Skin: Skin is warm. He is diaphoretic.  Nursing note and vitals reviewed.    ED Treatments / Results  Labs (all labs  ordered are listed, but only abnormal results are displayed) Labs Reviewed  CBC WITH DIFFERENTIAL/PLATELET - Abnormal; Notable for the following:       Result Value   WBC 18.9 (*)    RDW 16.0 (*)    Neutro Abs 15.3 (*)    Monocytes Absolute 1.7 (*)  All other components within normal limits  COMPREHENSIVE METABOLIC PANEL - Abnormal; Notable for the following:    CO2 19 (*)    Glucose, Bld 733 (*)    BUN 32 (*)    Creatinine, Ser 2.42 (*)    Calcium 8.4 (*)    Albumin 3.2 (*)    AST 232 (*)    ALT 154 (*)    Alkaline Phosphatase 128 (*)    Total Bilirubin 3.5 (*)    GFR calc non Af Amer 31 (*)    GFR calc Af Amer 36 (*)    Anion gap 17 (*)    All other components within normal limits  URINALYSIS, ROUTINE W REFLEX MICROSCOPIC (NOT AT [facility]) - Abnormal; Notable for the following:    Color, Urine AMBER (*)    APPearance CLOUDY (*)    Glucose, UA >1000 (*)    Hgb urine dipstick SMALL (*)    Bilirubin Urine MODERATE (*)    Ketones, ur 15 (*)    All other components within normal limits  BRAIN NATRIURETIC PEPTIDE - Abnormal; Notable for the following:    B Natriuretic Peptide 542.7 (*)    All other components within normal limits  PROTIME-INR - Abnormal; Notable for the following:    Prothrombin Time 17.4 (*)    All other components within normal limits  URINE MICROSCOPIC-ADD ON - Abnormal; Notable for the following:    Squamous Epithelial / LPF 0-5 (*)    Bacteria, UA RARE (*)    Casts HYALINE CASTS (*)    All other components within normal limits  I-STAT CG4 LACTIC ACID, ED - Abnormal; Notable for the following:    Lactic Acid, Venous 6.99 (*)    All other components within normal limits  I-STAT ARTERIAL BLOOD GAS, ED - Abnormal; Notable for the following:    pH, Arterial 7.285 (*)    pCO2 arterial 48.5 (*)    pO2, Arterial 55.0 (*)    Acid-base deficit 4.0 (*)    All other components within normal limits  CBG MONITORING, ED - Abnormal; Notable for the following:     Glucose-Capillary >600 (*)    All other components within normal limits  I-STAT CG4 LACTIC ACID, ED - Abnormal; Notable for the following:    Lactic Acid, Venous 3.61 (*)    All other components within normal limits  CBG MONITORING, ED - Abnormal; Notable for the following:    Glucose-Capillary >600 (*)    All other components within normal limits  CBG MONITORING, ED - Abnormal; Notable for the following:    Glucose-Capillary >600 (*)    All other components within normal limits  CULTURE, BLOOD (ROUTINE X 2)  CULTURE, BLOOD (ROUTINE X 2)  URINE CULTURE  MAGNESIUM  TRIGLYCERIDES  BLOOD GAS, ARTERIAL  CBC  BASIC METABOLIC PANEL  MAGNESIUM  PHOSPHORUS  I-STAT TROPOININ, ED    EKG  EKG Interpretation  Date/Time:  Sunday 07/14/2016 18:37:27 EDT Ventricular Rate:  133 PR Interval:    QRS Duration: 105 QT Interval:  307 QTC Calculation: 457 R Axis:   144 Text Interpretation:  Sinus tachycardia Right axis deviation Low voltage, precordial leads Abnormal R-wave progression, late transition Minimal ST elevation, anterolateral leads Baseline wander in lead(s) III aVL V1 V2 V3 V4 V5 V6 Since last tracing rate faster Confirmed by MILLER  MD, BRIAN ([id number]) on 2016/07/14 7:28:41 PM       Radiology Dg Chest Portable 1 View  Result Date: 07-14-2016  CLINICAL DATA:  Respiratory distress.  Endotracheal tube placement. EXAM: PORTABLE CHEST 1 VIEW COMPARISON:  04/01/2016 FINDINGS: Endotracheal tube has been placed with tip along the right side of the distal trachea 1.9 cm above the carina. Lungs are adequately inflated demonstrate mild interval worsening of patchy bilateral airspace consolidation worse over the right upper lobe. Possible left effusion. Stable cardiomegaly. Remainder the exam is unchanged. IMPRESSION: Moderate patchy bilateral airspace consolidation worse compared to the previous exam as findings may be due to multifocal pneumonia as cannot exclude a component of  interstitial edema. Possible small left effusion. Cardiomegaly. Endotracheal tube with tip 1.9 cm above the carina. Electronically Signed   By: Elberta Fortis M.D.   On: 06/21/2016 19:55     Procedures Procedures (including critical care time)  INTUBATION Performed by: Urban Gibson  Required items: required blood products, implants, devices, and special equipment available Patient identity confirmed: provided demographic data and hospital-assigned identification number Time out: Immediately prior to procedure a "time out" was called to verify the correct patient, procedure, equipment, support staff and site/side marked as required.  Indications: Acute hypoxic respiratory failure  Intubation method: Direct, with Mac 4 blade   Preoxygenation: Bipap, nasal cannula   Sedatives: Etomidate Paralytic: Succinylcholine  Tube Size: 8.0 cuffed  Post-procedure assessment: chest rise and ETCO2 monitor Breath sounds: equal and absent over the epigastrium Tube secured with: ETT holder Chest x-ray interpreted by radiologist and me.  Chest x-ray findings: endotracheal tube in appropriate position  Patient tolerated the procedure well with no immediate complications.      Medications Ordered in ED Medications  propofol (DIPRIVAN) 1000 MG/100ML infusion (not administered)  midazolam (VERSED) 2 MG/2ML injection (not administered)  etomidate (AMIDATE) injection (80 mg Intravenous Given 06/03/2016 1857)  succinylcholine (ANECTINE) injection (300 mg Intravenous Given 06/26/2016 1858)  midazolam (VERSED) injection (2 mg Intravenous Given 06/24/2016 1903)  insulin regular (NOVOLIN R,HUMULIN R) 250 Units in sodium chloride 0.9 % 250 mL (1 Units/mL) infusion (16.2 Units/hr Intravenous Rate/Dose Change 06/23/2016 2229)  dextrose 5 %-0.45 % sodium chloride infusion (not administered)  0.9 %  sodium chloride infusion (125 mL/hr Intravenous New Bag/Given 06/30/2016 2003)  vancomycin (VANCOCIN) 2,000 mg in sodium  chloride 0.9 % 500 mL IVPB (2,000 mg Intravenous New Bag/Given 06/29/2016 2110)  vancomycin (VANCOCIN) 1,250 mg in sodium chloride 0.9 % 250 mL IVPB (not administered)  propofol (DIPRIVAN) 1000 MG/100ML infusion (not administered)  propofol (DIPRIVAN) 1000 MG/100ML infusion (not administered)  propofol (DIPRIVAN) 1000 MG/100ML infusion (not administered)  0.9 %  sodium chloride infusion (not administered)  aspirin chewable tablet 324 mg (not administered)    Or  aspirin suppository 300 mg (not administered)  famotidine (PEPCID) 40 MG/5ML suspension 20 mg (not administered)  norepinephrine (LEVOPHED) 4 mg in dextrose 5 % 250 mL (0.016 mg/mL) infusion (6 mcg/min Intravenous Rate/Dose Change 06/26/2016 2039)  propofol (DIPRIVAN) 1000 MG/100ML infusion (50 mcg/kg/min  260.8 kg Intravenous New Bag/Given 06/07/2016 1919)  fentaNYL (SUBLIMAZE) 100 MCG/2ML injection (100 mcg  Given 06/04/2016 2037)  sodium chloride 0.9 % bolus 2,000 mL (0 mLs Intravenous Stopped 06/16/2016 2108)  sodium chloride 0.9 % bolus 1,000 mL (0 mLs Intravenous Stopped 06/03/2016 2108)  sodium chloride 0.9 % bolus 1,000 mL (0 mLs Intravenous Stopped 06/04/2016 2109)  piperacillin-tazobactam (ZOSYN) IVPB 3.375 g (0 g Intravenous Stopped 06/16/2016 2108)     Initial Impression / Assessment and Plan / ED Course  I have reviewed the triage vital signs and the nursing notes.  Pertinent labs &  imaging results that were available during my care of the patient were reviewed by me and considered in my medical decision making (see chart for details).  Clinical Course    45 year old male with history of morbid obesity, CHF, chronic respiratory failure and obesity hypoventilation syndrome, CKD, DM, OSA, depression, atrial fibrillation and flutter, hypertension presenting with respiratory distress, as above. Upon arrival he is hypotensive, hypoxic, tachycardic, with increased WOB despite bipap. Unable to tolerate bipap being off for even short periods of  time without quickly desaturating back to 60s. Intubated as above for respiratory failure.   Notable for hyperglycemia and DKA, multifocal pneumonia, possible component of CHF exacerbation with elevated BNP and possible pulmonary edema, mildly elevated LFTs. Given several liters of IV fluid for hyperglycemia and sepsis, though not full 30 mL/kg in setting of CHF. Given insulin for DKA. Cultures obtained and patient started on broad-spectrum antibiotic coverage. Levophed ordered as needed for hypertension. Sedation with propofol. Admitted to ICU for further care of acute hypoxic respiratory failure, pneumonia, DKA.  Case discussed with Dr. Hyacinth Meeker who oversaw management of this patient.   Final Clinical Impressions(s) / ED Diagnoses   Final diagnoses:  Acute respiratory failure with hypoxia (HCC)  Diabetic ketoacidosis without coma associated with type 1 diabetes mellitus (HCC)  CAP (community acquired pneumonia)    New Prescriptions New Prescriptions   No medications on file     Urban Gibson, MD 07-11-16 2324    Eber Hong, MD 06/19/16 [phone]

## 2016-06-18 NOTE — Sedation Documentation (Signed)
Pt intubated. Bilateral breath sounds.

## 2016-06-18 NOTE — Sedation Documentation (Signed)
Portable xray contacted for chest xray in Trauma A

## 2016-06-18 NOTE — ED Notes (Signed)
Report attempted, RN unable to get report at this time. 

## 2016-06-18 NOTE — H&P (Signed)
PULMONARY / CRITICAL CARE MEDICINE   Name: [name] MRN: [id number] DOB: 06-02-1971    ADMISSION DATE:  07-09-2016 CONSULTATION DATE: 2016-07-09  REFERRING MD:  EDP  CHIEF COMPLAINT:  Shortness of breath  HISTORY OF PRESENT ILLNESS:   [name] is a 74M with morbid obesity, OSA/OHS, hx a flutter on xarelto, HFpEF, DM2, HTN, depression, and CKD who presented to the ED via EMS with complaints of shortness of breath. He was placed on CPAP en route with little improvement and was intubated shortly after arrival here. He was also found to be markedly hyperglycemic with glucose > 700.  In the ED he was intubated and a peripheral iv was placed. ABG showed a mixed respiratory and metabolic acidosis. Lactate was elevated. Cr. 2.42 from 1.14 in July. HCO3 19, Glucose 733, UA with trace ketones. BNP 527. Troponin 0.06. WBC 18.9 with left shift.  Family reported that he lives alone and that he has been sleeping in a chair in the living room recently, but that they had no indication things were getting this bad. He uses oxygen at home, but not cpap or bipap. They say he would want to be full code.  PAST MEDICAL HISTORY :  He  has a past medical history of Abscess; Asthma; Atrial fibrillation (HCC); Atrial flutter (HCC); Cellulitis of thigh (10/12/2011); CHF (congestive heart failure) (HCC); Chronic low back pain; Chronic respiratory failure (HCC); CKD (chronic kidney disease), stage II; Depression; DM2 (diabetes mellitus, type 2) (HCC); HTN (hypertension); Morbid obesity (HCC); Obesity hypoventilation syndrome (HCC); Patellar tendon rupture (12/18/2014); Ptosis; Sleep apnea; and Vision abnormalities.  PAST SURGICAL HISTORY: He  has a past surgical history that includes Tonsillectomy; Cholecystectomy; Tonsillectomy; Irrigation and debridement abscess (10/13/2011); Application if wound vac; Breath tek h pylori (N/A, 10/06/2014); and Patellar tendon repair (Left, 12/21/2014).  Allergies  Allergen Reactions  .  Iodine Anaphylaxis and Other (See Comments)    Associated with the shellfish allergy  . Shellfish Allergy Anaphylaxis    No current facility-administered medications on file prior to encounter.    Current Outpatient Prescriptions on File Prior to Encounter  Medication Sig  . albuterol (VENTOLIN HFA) 108 (90 BASE) MCG/ACT inhaler Inhale 2 puffs into the lungs every 6 (six) hours as needed. Shortness of breath (Patient taking differently: Inhale 2 puffs into the lungs every 6 (six) hours as needed for wheezing or shortness of breath. )  . buPROPion (WELLBUTRIN XL) 300 MG 24 hr tablet Take 300 mg by mouth Daily.   . divalproex (DEPAKOTE) 500 MG DR tablet Take 1,000 mg by mouth daily.   . furosemide (LASIX) 40 MG tablet Take 1.5 tablets (60 mg total) by mouth 2 (two) times daily. (Patient taking differently: Take 40 mg by mouth daily. )  . lamoTRIgine (LAMICTAL) 25 MG tablet Take 25 mg by mouth at bedtime.   . metoprolol tartrate (LOPRESSOR) 25 MG tablet Take 1 tablet (25 mg total) by mouth 2 (two) times daily.  . nitroGLYCERIN (NITROSTAT) 0.4 MG SL tablet Place 0.4 mg under the tongue every 5 (five) minutes as needed for chest pain.   . potassium chloride SA (K-DUR,KLOR-CON) 20 MEQ tablet Take 1 tablet (20 mEq total) by mouth 3 (three) times daily. (Patient taking differently: Take 20 mEq by mouth 2 (two) times daily. )  . rivaroxaban (XARELTO) 20 MG TABS tablet Take 1 tablet (20 mg total) by mouth daily with supper. (Patient taking differently: Take 20 mg by mouth daily with breakfast. )  . acetaminophen (  TYLENOL) 325 MG tablet Take 2 tablets (650 mg total) by mouth every 4 (four) hours as needed for mild pain (temp > 101.5).  Marland Kitchen albuterol (PROVENTIL) (2.5 MG/3ML) 0.083% nebulizer solution Take 3 mLs (2.5 mg total) by nebulization every 6 (six) hours as needed for wheezing. (Patient not taking: Reported on 07-01-16)  . ALPRAZolam (XANAX) 0.5 MG tablet Take 1 tablet (0.5 mg total) by mouth daily as  needed for anxiety. (Patient not taking: Reported on 07/01/16)  . insulin aspart (NOVOLOG) 100 UNIT/ML injection Inject 0-20 Units into the skin 3 (three) times daily with meals. (Patient not taking: Reported on 07/01/2016)  . insulin aspart (NOVOLOG) 100 UNIT/ML injection Inject 0-5 Units into the skin at bedtime. (Patient not taking: Reported on 07/01/2016)  . insulin aspart (NOVOLOG) 100 UNIT/ML injection Inject 8 Units into the skin 3 (three) times daily with meals. (Patient not taking: Reported on 07/01/2016)  . insulin glargine (LANTUS) 100 UNIT/ML injection Inject 0.37 mLs (37 Units total) into the skin 2 (two) times daily. (Patient not taking: Reported on 2016/07/01)  . nystatin (MYCOSTATIN/NYSTOP) 100000 UNIT/GM POWD Apply to affected area TID (Patient not taking: Reported on 2016-07-01)    FAMILY HISTORY:  His indicated that his mother is deceased. He indicated that his father is deceased.    SOCIAL HISTORY: He  reports that he has never smoked. He does not have any smokeless tobacco history on file. He reports that he drinks about 0.6 oz of alcohol per week . He reports that he does not use drugs.  REVIEW OF SYSTEMS:   Unable to obtain 2/2 intubated state  SUBJECTIVE:  N/a  VITAL SIGNS: BP 106/87   Pulse 105   Temp 98.8 F (37.1 C) (Axillary)   Resp 22   Ht 6\' 3"  (1.905 m)   Wt (!) 260.8 kg (575 lb)   SpO2 (!) 85%   BMI 71.87 kg/m   HEMODYNAMICS:    VENTILATOR SETTINGS: Vent Mode: PRVC FiO2 (%):  [100 %] 100 % Set Rate:  [25 bmp-32 bmp] 32 bmp Vt Set:  [510 mL-670 mL] 510 mL PEEP:  [12 cmH20] 12 cmH20 Plateau Pressure:  [34 cmH20-38 cmH20] 34 cmH20  INTAKE / OUTPUT: No intake/output data recorded.  PHYSICAL EXAMINATION:   General Well developed, super morbidly obese, intubated, sedated   HEENT No gross abnormalities. ETT and OGT in place  Pulmonary Clear to auscultation bilaterally with no wheezes, rales or ronchi. Exam limited by body habitus    Cardiovascular Normal rate, regular rhythm. S1, s2. No m/r/g. Distal pulses palpable. Extremities warm with brisk cap refill  Abdomen Soft, obese, positive bowel sounds, no palpable organomegaly or masses. Normoresonant to percussion.  Musculoskeletal No gross abnormalities  Lymphatics No cervical, supraclavicular or axillary adenopathy.   Neurologic PERRL. Full exam limited by sedation  Skin/Integuement No rash, no cyanosis, no clubbing.     LABS:  BMET  Recent Labs Lab 07-01-16 1840  NA 137  K 4.9  CL 101  CO2 19*  BUN 32*  CREATININE 2.42*  GLUCOSE 733*    Electrolytes  Recent Labs Lab 2016/07/01 1840  CALCIUM 8.4*  MG 1.8    CBC  Recent Labs Lab 07-01-2016 1840  WBC 18.9*  HGB 14.3  HCT 43.2  PLT 217    Coag's  Recent Labs Lab 07/01/16 1840  INR 1.41    Sepsis Markers  Recent Labs Lab 07-01-2016 1856 Jul 01, 2016 2202  LATICACIDVEN 6.99* 3.61*    ABG  Recent Labs Lab  07-13-2016 1937  PHART 7.285*  PCO2ART 48.5*  PO2ART 55.0*    Liver Enzymes  Recent Labs Lab 07/13/2016 1840  AST 232*  ALT 154*  ALKPHOS 128*  BILITOT 3.5*  ALBUMIN 3.2*    Cardiac Enzymes No results for input(s): TROPONINI, PROBNP in the last 168 hours.  Glucose  Recent Labs Lab 07-13-2016 1851 July 13, 2016 2113 07/13/2016 2225  GLUCAP >600* >600* >600*    Imaging Dg Chest Portable 1 View  Result Date: 07-13-2016 CLINICAL DATA:  Respiratory distress.  Endotracheal tube placement. EXAM: PORTABLE CHEST 1 VIEW COMPARISON:  04/01/2016 FINDINGS: Endotracheal tube has been placed with tip along the right side of the distal trachea 1.9 cm above the carina. Lungs are adequately inflated demonstrate mild interval worsening of patchy bilateral airspace consolidation worse over the right upper lobe. Possible left effusion. Stable cardiomegaly. Remainder the exam is unchanged. IMPRESSION: Moderate patchy bilateral airspace consolidation worse compared to the previous exam as  findings may be due to multifocal pneumonia as cannot exclude a component of interstitial edema. Possible small left effusion. Cardiomegaly. Endotracheal tube with tip 1.9 cm above the carina. Electronically Signed   By: Elberta Fortis M.D.   On: 07-13-2016 19:55    STUDIES:  CXR as above  CULTURES: Blood  9/17 >>   ANTIBIOTICS: Vancomycin 9/17 >> Zosyn 9/17 >>  SIGNIFICANT EVENTS:   LINES/TUBES: ETT 9/17 >> OGT 9/17 >> PIV  DISCUSSION: Mr. Parenteau is a 67M with multiple medical problems who presents in acute on chronic hypoxemic /hypercapneic respiratory failure requiring intubation. Post-intubation he has had mild hypotension requiring levophed, but is also on 50 of propofol. His BP prior to intubation was adequate. He has received 6L IVF and is on an insulin gtt for his hyperglycemia. His elevated BNP does suggest a component of heart failure and he will need diuresis as soon as is feasible. He appears septic with elevated WBC and lactic acid, likely from a pulmonary source. He is being covered with vancomycin and Zosyn.   ASSESSMENT / PLAN:  PULMONARY A: Acute on chronic hypoxemic / hypercapneic respiratory failure - on O2 at home, prior labs suggest chronic CO2 retention P:   Continue ventilatory support- ARDSnet protocol Wean support as able SBT when able High risk candidate for re-intubation and would likely benefit from extubation direct to NIPPV when ready  CARDIOVASCULAR A:  Hx atrial flutter Hypotension - likely medication induced  P:  Monitor for rhythm changes Monitor BP; wean propofol  RENAL A:   Acute kidney injury (Cr. 2.42 from 1.14 in 04/2016) - likely secondary to sepsis and relative volume depletion P:   Volume resuscitate Trend Cr Strict I/Os  GASTROINTESTINAL A:   Acute liver injury - elevated AST/ALT/AlkP/Tbili P:   NPO  Famotidine per tube for GI ppx Abdominal u/s Lipase Trend liver function  HEMATOLOGIC A:   No acute issues Chronic  Xarelto P:  Defer anticoagulation to day team  INFECTIOUS A:   Sepsis secondary to presumed pulmonary source P:   Continue vancomycin and zosyn  ENDOCRINE A:   DM2, uncontrolled P:   Insulin gtt for now  NEUROLOGIC A:   Altered mental status on presentation - likely secondary to hypoxia and hypercapnea P:   RASS goal: -2 Wean propofol as tolerated for above RASS goal   FAMILY  - Updates: no family available at the time of my interview. However, per report and review of the chart, patient desires to remain FULL CODE.  - Inter-disciplinary family meet  or Palliative Care meeting due by:  day 7  The patient is critically ill with multiple organ system failure and requires high complexity decision making for assessment and support, frequent evaluation and titration of therapies, advanced monitoring, review of radiographic studies and interpretation of complex data.   Critical Care Time devoted to patient care services, exclusive of separately billable procedures, described in this note is 52 minutes.   Nita SickleSarah Ellen E. Cade Dashner, MD Pulmonary and Critical Care Medicine [facility] Pager: [phone]  06/20/2016, 10:35 PM

## 2016-06-18 NOTE — Progress Notes (Addendum)
Pharmacy Antibiotic Note  [name] is a 45 y.o. male admitted on 06/23/2016 with pneumonia.  Pharmacy has been consulted for vancomycin dosing.  Plan: Vancomycin 2g x 1, then Vancomycin 1250 IV every 12 hours.  Goal trough 15-20 mcg/mL.  Check trough ASAP given weight and renal function. F/u plans to continue Zosyn?  Height: 6\' 3"  (190.5 cm) Weight: (!) 575 lb (260.8 kg) IBW/kg (Calculated) : 84.5  Temp (24hrs), Avg:98.8 F (37.1 C), Min:98.8 F (37.1 C), Max:98.8 F (37.1 C)   Recent Labs Lab 06/21/2016 1840 06/17/2016 1856  WBC 18.9*  --   CREATININE 2.42*  --   LATICACIDVEN  --  6.99*    Estimated Creatinine Clearance: 84.5 mL/min (by C-G formula based on SCr of 2.42 mg/dL (H)).    Allergies  Allergen Reactions  . Iodine Anaphylaxis and Other (See Comments)    Associated with the shellfish allergy  . Shellfish Allergy Anaphylaxis    Antimicrobials this admission: Vancomycin 9/17 >>  Zosyn 9/17 x 1  Dose adjustments this admission:   Microbiology results:  BCx:   UCx:   Sputum:    MRSA PCR:  Thank you for allowing pharmacy to be a part of this patient's care.  Tad MooreJessica Akiem Urieta, Pharm D, BCPS  Clinical Pharmacist Pager [phone]  06/28/2016 8:14 PM

## 2016-06-18 NOTE — ED Provider Notes (Signed)
I saw and evaluated the patient, reviewed the resident's note and I agree with the findings and plan.  Pertinent History: [age] year old critically ill appearing male, history of congestive heart failure, history of pneumonia, history of diabetes. The paramedic's recall to the primary residence because of severe shortness of breath and found the patient to be severely hypoxic, cyanotic, and somnolent. They found his blood sugar to be over 600 and unreadable high, they were unable to get IV access but were able to place BiPAP. Oxygen came up to 80%. The patient was not mentating appropriately.  Pertinent Exam findings: On exam the patient was in severe respiratory distress. He is morbidly obese weighing in at near 600 pounds, he is able to help somewhat when he is arousable to move around in the bed. He had decreased lung sounds, rales are present, there is no secretions, he had dry lips, his mentation was decreased, he was tachycardic, he has peripheral edema.  I was personally present and directly supervised the following procedures:  Intubation  Angiocath insertion Performed by: Vida RollerBrian D Dantre Yearwood  Consent: Verbal consent obtained. Risks and benefits: risks, benefits and alternatives were discussed Time out: Immediately prior to procedure a "time out" was called to verify the correct patient, procedure, equipment, support staff and site/side marked as required.  Preparation: Patient was prepped and draped in the usual sterile fashion.  Vein Location: R AC  Ultrasound Guided  Gauge: 20  Normal blood return and flush without difficulty Patient tolerance: Patient tolerated the procedure well with no immediate complications.   Interventions included intubation, sedation, ventilator management, propofol, chest x-ray, sugar control, DKA orders at used, insulin drip, the patient will need intensive care unit admission.  CRITICAL CARE Performed by: Vida RollerBrian D Hassaan Crite Total critical care time: 35  minutes Critical care time was exclusive of separately billable procedures and treating other patients. Critical care was necessary to treat or prevent imminent or life-threatening deterioration. Critical care was time spent personally by me on the following activities: development of treatment plan with patient and/or surrogate as well as nursing, discussions with consultants, evaluation of patient's response to treatment, examination of patient, obtaining history from patient or surrogate, ordering and performing treatments and interventions, ordering and review of laboratory studies, ordering and review of radiographic studies, pulse oximetry and re-evaluation of patient's condition.  I personally interpreted the EKG as well as the resident and agree with the interpretation on the resident's chart.  Final diagnoses:  Acute respiratory failure with hypoxia (HCC)  Diabetic ketoacidosis without coma associated with type 1 diabetes mellitus (HCC)  CAP (community acquired pneumonia)      Eber HongBrian Daviana Haymaker, MD 06/19/16 [phone]

## 2016-06-19 ENCOUNTER — Ambulatory Visit: Payer: Medicare Other | Admitting: Adult Health

## 2016-06-19 ENCOUNTER — Encounter: Payer: Self-pay | Admitting: Internal Medicine

## 2016-06-19 ENCOUNTER — Inpatient Hospital Stay (HOSPITAL_COMMUNITY): Payer: Medicare Other

## 2016-06-19 LAB — POCT I-STAT 3, ART BLOOD GAS (G3+)
ACID-BASE EXCESS: 1 mmol/L (ref 0.0–2.0)
Bicarbonate: 25.1 mmol/L (ref 20.0–28.0)
O2 SAT: 95 %
PH ART: 7.407 (ref 7.350–7.450)
TCO2: 26 mmol/L (ref 0–100)
pCO2 arterial: 40.4 mmHg (ref 32.0–48.0)
pO2, Arterial: 77 mmHg — ABNORMAL LOW (ref 83.0–108.0)

## 2016-06-19 LAB — HEPATIC FUNCTION PANEL
ALBUMIN: 2.8 g/dL — AB (ref 3.5–5.0)
ALK PHOS: 99 U/L (ref 38–126)
ALT: 134 U/L — ABNORMAL HIGH (ref 17–63)
AST: 227 U/L — ABNORMAL HIGH (ref 15–41)
Bilirubin, Direct: 0.7 mg/dL — ABNORMAL HIGH (ref 0.1–0.5)
Indirect Bilirubin: 0.9 mg/dL (ref 0.3–0.9)
TOTAL PROTEIN: 6.4 g/dL — AB (ref 6.5–8.1)
Total Bilirubin: 1.6 mg/dL — ABNORMAL HIGH (ref 0.3–1.2)

## 2016-06-19 LAB — BASIC METABOLIC PANEL
ANION GAP: 11 (ref 5–15)
ANION GAP: 8 (ref 5–15)
BUN: 36 mg/dL — ABNORMAL HIGH (ref 6–20)
BUN: 36 mg/dL — ABNORMAL HIGH (ref 6–20)
CO2: 21 mmol/L — ABNORMAL LOW (ref 22–32)
CO2: 24 mmol/L (ref 22–32)
Calcium: 7.8 mg/dL — ABNORMAL LOW (ref 8.9–10.3)
Calcium: 7.7 mg/dL — ABNORMAL LOW (ref 8.9–10.3)
Chloride: 107 mmol/L (ref 101–111)
Chloride: 107 mmol/L (ref 101–111)
Creatinine, Ser: 2.53 mg/dL — ABNORMAL HIGH (ref 0.61–1.24)
Creatinine, Ser: 2.22 mg/dL — ABNORMAL HIGH (ref 0.61–1.24)
GFR, EST AFRICAN AMERICAN: 34 mL/min — AB (ref >60)
GFR, EST AFRICAN AMERICAN: 39 mL/min — AB (ref >60)
GFR, EST NON AFRICAN AMERICAN: 29 mL/min — AB (ref >60)
GFR, EST NON AFRICAN AMERICAN: 34 mL/min — AB (ref >60)
GLUCOSE: 343 mg/dL — AB (ref 65–99)
GLUCOSE: 222 mg/dL — AB (ref 65–99)
POTASSIUM: 4.5 mmol/L (ref 3.5–5.1)
POTASSIUM: 4.1 mmol/L (ref 3.5–5.1)
Sodium: 139 mmol/L (ref 135–145)
Sodium: 139 mmol/L (ref 135–145)

## 2016-06-19 LAB — GLUCOSE, CAPILLARY
GLUCOSE-CAPILLARY: 316 mg/dL — AB (ref 65–99)
GLUCOSE-CAPILLARY: 293 mg/dL — AB (ref 65–99)
GLUCOSE-CAPILLARY: 195 mg/dL — AB (ref 65–99)
GLUCOSE-CAPILLARY: 179 mg/dL — AB (ref 65–99)
GLUCOSE-CAPILLARY: 167 mg/dL — AB (ref 65–99)
GLUCOSE-CAPILLARY: 213 mg/dL — AB (ref 65–99)
Glucose-Capillary: 142 mg/dL — ABNORMAL HIGH (ref 65–99)
Glucose-Capillary: 163 mg/dL — ABNORMAL HIGH (ref 65–99)
Glucose-Capillary: 202 mg/dL — ABNORMAL HIGH (ref 65–99)
Glucose-Capillary: 209 mg/dL — ABNORMAL HIGH (ref 65–99)
Glucose-Capillary: 225 mg/dL — ABNORMAL HIGH (ref 65–99)
Glucose-Capillary: 257 mg/dL — ABNORMAL HIGH (ref 65–99)
Glucose-Capillary: 375 mg/dL — ABNORMAL HIGH (ref 65–99)
Glucose-Capillary: 485 mg/dL — ABNORMAL HIGH (ref 65–99)
Glucose-Capillary: 510 mg/dL (ref 65–99)

## 2016-06-19 LAB — MAGNESIUM
MAGNESIUM: 1.9 mg/dL (ref 1.7–2.4)
MAGNESIUM: 1.9 mg/dL (ref 1.7–2.4)
Magnesium: 1.9 mg/dL (ref 1.7–2.4)

## 2016-06-19 LAB — LIPASE, BLOOD: LIPASE: 17 U/L (ref 11–51)

## 2016-06-19 LAB — PHOSPHORUS
PHOSPHORUS: 4 mg/dL (ref 2.5–4.6)
PHOSPHORUS: 3.1 mg/dL (ref 2.5–4.6)
PHOSPHORUS: 3.3 mg/dL (ref 2.5–4.6)

## 2016-06-19 LAB — MRSA PCR SCREENING: MRSA BY PCR: NEGATIVE

## 2016-06-19 LAB — APTT: APTT: 41 s — AB (ref 24–36)

## 2016-06-19 LAB — CBC
HEMATOCRIT: 40.3 % (ref 39.0–52.0)
Hemoglobin: 13 g/dL (ref 13.0–17.0)
MCH: 27.4 pg (ref 26.0–34.0)
MCHC: 32.3 g/dL (ref 30.0–36.0)
MCV: 84.8 fL (ref 78.0–100.0)
PLATELETS: 195 10*3/uL (ref 150–400)
RBC: 4.75 MIL/uL (ref 4.22–5.81)
RDW: 16 % — ABNORMAL HIGH (ref 11.5–15.5)
WBC: 17.9 10*3/uL — AB (ref 4.0–10.5)

## 2016-06-19 LAB — HEPARIN LEVEL (UNFRACTIONATED)

## 2016-06-19 MED ORDER — DEXTROSE 5 % IV SOLN
0.0000 ug/min | INTRAVENOUS | Status: DC
  Administered 2016-06-19: 4 ug/min via INTRAVENOUS
  Filled 2016-06-19: qty 4

## 2016-06-19 MED ORDER — SODIUM CHLORIDE 0.9% FLUSH: 10.0000 mL | INTRAVENOUS | Status: DC | PRN

## 2016-06-19 MED ORDER — CHLORHEXIDINE GLUCONATE 0.12% ORAL RINSE (MEDLINE KIT)
15.0000 mL | Freq: Two times a day (BID) | OROMUCOSAL | Status: DC
  Administered 2016-06-19 – 2016-06-27 (×17): 15 mL via OROMUCOSAL

## 2016-06-19 MED ORDER — FAMOTIDINE IN NACL 20-0.9 MG/50ML-% IV SOLN
20.0000 mg | Freq: Two times a day (BID) | INTRAVENOUS | Status: DC
  Administered 2016-06-19 – 2016-06-25 (×15): 20 mg via INTRAVENOUS
  Filled 2016-06-19 (×17): qty 50

## 2016-06-19 MED ORDER — INSULIN DETEMIR 100 UNIT/ML ~~LOC~~ SOLN
50.0000 [IU] | Freq: Two times a day (BID) | SUBCUTANEOUS | Status: DC
  Administered 2016-06-19 – 2016-06-20 (×3): 50 [IU] via SUBCUTANEOUS
  Filled 2016-06-19 (×4): qty 0.5

## 2016-06-19 MED ORDER — FENTANYL CITRATE (PF) 100 MCG/2ML IJ SOLN
50.0000 ug | Freq: Once | INTRAMUSCULAR | Status: AC
  Administered 2016-06-19: 50 ug via INTRAVENOUS

## 2016-06-19 MED ORDER — PROPOFOL 1000 MG/100ML IV EMUL
5.0000 ug/kg/min | INTRAVENOUS | Status: DC
  Administered 2016-06-19: 40 ug/kg/min via INTRAVENOUS
  Administered 2016-06-19: 20 ug/kg/min via INTRAVENOUS
  Administered 2016-06-19: 30 ug/kg/min via INTRAVENOUS
  Filled 2016-06-19: qty 300
  Filled 2016-06-19: qty 100

## 2016-06-19 MED ORDER — VITAL HIGH PROTEIN PO LIQD: 1000.0000 mL | ORAL | Status: DC

## 2016-06-19 MED ORDER — PRO-STAT SUGAR FREE PO LIQD
30.0000 mL | Freq: Two times a day (BID) | ORAL | Status: DC
  Administered 2016-06-19: 30 mL
  Filled 2016-06-19: qty 30

## 2016-06-19 MED ORDER — HEPARIN (PORCINE) IN NACL 100-0.45 UNIT/ML-% IJ SOLN
2000.0000 [IU]/h | INTRAMUSCULAR | Status: DC
  Administered 2016-06-19: 2000 [IU]/h via INTRAVENOUS
  Administered 2016-06-20: 2800 [IU]/h via INTRAVENOUS
  Administered 2016-06-20: 2500 [IU]/h via INTRAVENOUS
  Administered 2016-06-21 – 2016-06-26 (×15): 2800 [IU]/h via INTRAVENOUS
  Administered 2016-06-27: 2300 [IU]/h via INTRAVENOUS
  Administered 2016-06-27: 2000 [IU]/h via INTRAVENOUS
  Filled 2016-06-19 (×36): qty 250

## 2016-06-19 MED ORDER — PHENYLEPHRINE HCL 10 MG/ML IJ SOLN
30.0000 ug/min | INTRAVENOUS | Status: DC
  Administered 2016-06-19: 30 ug/min via INTRAVENOUS
  Filled 2016-06-19 (×2): qty 1

## 2016-06-19 MED ORDER — SODIUM CHLORIDE 0.9% FLUSH
10.0000 mL | Freq: Two times a day (BID) | INTRAVENOUS | Status: DC
  Administered 2016-06-19: 30 mL
  Administered 2016-06-19 – 2016-06-26 (×10): 10 mL

## 2016-06-19 MED ORDER — MIDAZOLAM HCL 2 MG/2ML IJ SOLN
2.0000 mg | INTRAMUSCULAR | Status: AC | PRN
Start: 2016-06-19 — End: 2016-06-22
  Administered 2016-06-21 – 2016-06-22 (×3): 2 mg via INTRAVENOUS
  Filled 2016-06-19 (×4): qty 2

## 2016-06-19 MED ORDER — INSULIN ASPART 100 UNIT/ML ~~LOC~~ SOLN
0.0000 [IU] | SUBCUTANEOUS | Status: DC
  Administered 2016-06-19: 4 [IU] via SUBCUTANEOUS
  Administered 2016-06-19: 3 [IU] via SUBCUTANEOUS
  Administered 2016-06-19 (×2): 7 [IU] via SUBCUTANEOUS
  Administered 2016-06-20 (×3): 4 [IU] via SUBCUTANEOUS
  Administered 2016-06-20: 7 [IU] via SUBCUTANEOUS
  Administered 2016-06-20 – 2016-06-21 (×6): 4 [IU] via SUBCUTANEOUS
  Administered 2016-06-21: 3 [IU] via SUBCUTANEOUS
  Administered 2016-06-21 (×2): 4 [IU] via SUBCUTANEOUS
  Administered 2016-06-22: 3 [IU] via SUBCUTANEOUS
  Administered 2016-06-22 (×2): 4 [IU] via SUBCUTANEOUS
  Administered 2016-06-22: 3 [IU] via SUBCUTANEOUS
  Administered 2016-06-22: 4 [IU] via SUBCUTANEOUS
  Administered 2016-06-22: 3 [IU] via SUBCUTANEOUS
  Administered 2016-06-23: 4 [IU] via SUBCUTANEOUS
  Administered 2016-06-23: 3 [IU] via SUBCUTANEOUS
  Administered 2016-06-23 – 2016-06-24 (×4): 4 [IU] via SUBCUTANEOUS
  Administered 2016-06-24 (×3): 3 [IU] via SUBCUTANEOUS
  Administered 2016-06-24 – 2016-06-25 (×4): 4 [IU] via SUBCUTANEOUS
  Administered 2016-06-25 (×3): 3 [IU] via SUBCUTANEOUS
  Administered 2016-06-25 – 2016-06-26 (×6): 4 [IU] via SUBCUTANEOUS
  Administered 2016-06-26: 7 [IU] via SUBCUTANEOUS
  Administered 2016-06-27: 20 [IU] via SUBCUTANEOUS
  Administered 2016-06-27: 15 [IU] via SUBCUTANEOUS
  Administered 2016-06-27: 11 [IU] via SUBCUTANEOUS

## 2016-06-19 MED ORDER — ORAL CARE MOUTH RINSE
15.0000 mL | Freq: Four times a day (QID) | OROMUCOSAL | Status: DC
  Administered 2016-06-19 – 2016-06-27 (×34): 15 mL via OROMUCOSAL

## 2016-06-19 MED ORDER — PHENYLEPHRINE HCL 10 MG/ML IJ SOLN
30.0000 ug/min | INTRAVENOUS | Status: DC
  Administered 2016-06-19: 30 ug/min via INTRAVENOUS
  Filled 2016-06-19: qty 4

## 2016-06-19 MED ORDER — VITAL HIGH PROTEIN PO LIQD
1000.0000 mL | ORAL | Status: DC
  Administered 2016-06-19 – 2016-06-20 (×2): 1000 mL
  Administered 2016-06-20 (×2)
  Filled 2016-06-19: qty 1000

## 2016-06-19 MED ORDER — SODIUM CHLORIDE 0.9 % IV SOLN
25.0000 ug/h | INTRAVENOUS | Status: DC
  Administered 2016-06-19: 400 ug/h via INTRAVENOUS
  Administered 2016-06-19: 50 ug/h via INTRAVENOUS
  Administered 2016-06-19: 400 ug/h via INTRAVENOUS
  Administered 2016-06-20: 300 ug/h via INTRAVENOUS
  Administered 2016-06-20: 250 ug/h via INTRAVENOUS
  Administered 2016-06-20: 375 ug/h via INTRAVENOUS
  Administered 2016-06-21: 400 ug/h via INTRAVENOUS
  Administered 2016-06-21: 300 ug/h via INTRAVENOUS
  Administered 2016-06-21 – 2016-06-26 (×16): 400 ug/h via INTRAVENOUS
  Filled 2016-06-19 (×26): qty 50

## 2016-06-19 MED ORDER — MIDAZOLAM HCL 2 MG/2ML IJ SOLN
2.0000 mg | INTRAMUSCULAR | Status: DC | PRN
  Administered 2016-06-19 – 2016-06-23 (×12): 2 mg via INTRAVENOUS
  Filled 2016-06-19 (×12): qty 2

## 2016-06-19 MED ORDER — SODIUM CHLORIDE 0.9% FLUSH: 10.0000 mL | Freq: Two times a day (BID) | INTRAVENOUS | Status: DC

## 2016-06-19 MED ORDER — FENTANYL BOLUS VIA INFUSION
50.0000 ug | INTRAVENOUS | Status: DC | PRN
  Administered 2016-06-20 – 2016-06-22 (×10): 50 ug via INTRAVENOUS
  Filled 2016-06-19: qty 50

## 2016-06-19 MED ORDER — NOREPINEPHRINE BITARTRATE 1 MG/ML IV SOLN
0.0000 ug/min | INTRAVENOUS | Status: DC
  Filled 2016-06-19: qty 4

## 2016-06-19 MED ORDER — SODIUM CHLORIDE 0.9% FLUSH
10.0000 mL | INTRAVENOUS | Status: DC | PRN
  Administered 2016-06-22: 10 mL
  Filled 2016-06-19: qty 40

## 2016-06-19 NOTE — Progress Notes (Signed)
ANTICOAGULATION CONSULT NOTE - Initial Consult  Pharmacy Consult for heparin Indication: atrial flutter  Allergies  Allergen Reactions  . Iodine Anaphylaxis and Other (See Comments)    Associated with the shellfish allergy  . Shellfish Allergy Anaphylaxis    Patient Measurements: Height: 6\' 3"  (190.5 cm) Weight: (!) 580 lb (263.1 kg) IBW/kg (Calculated) : 84.5 Heparin Dosing Weight: 152.8 kg  Vital Signs: Temp: 99.5 F (37.5 C) (09/18 1127) Temp Source: Oral (09/18 1127) BP: 115/53 (09/18 1300) Pulse Rate: 109 (09/18 1300)  Labs:  Recent Labs  23-Jul-2016 1840 06/19/16 0404 06/19/16 0922  HGB 14.3 13.0  --   HCT 43.2 40.3  --   PLT 217 195  --   APTT  --   --  41*  LABPROT 17.4*  --   --   INR 1.41  --   --   HEPARINUNFRC  --   --  <0.10*  CREATININE 2.42* 2.53*  --     Estimated Creatinine Clearance: 81.3 mL/min (by C-G formula based on SCr of 2.53 mg/dL (H)).   Medical History: Past Medical History:  Diagnosis Date  . Abscess    right thigh  . Asthma   . Atrial fibrillation (HCC)   . Atrial flutter (HCC)   . Cellulitis of thigh 10/12/2011  . CHF (congestive heart failure) (HCC)    EF55-60%  . Chronic low back pain   . Chronic respiratory failure (HCC)   . CKD (chronic kidney disease), stage II   . Depression   . DM2 (diabetes mellitus, type 2) (HCC)   . HTN (hypertension)   . Morbid obesity (HCC)   . Obesity hypoventilation syndrome (HCC)   . Patellar tendon rupture 12/18/2014  . Ptosis   . Sleep apnea   . Vision abnormalities     Assessment: 44yo M admitted on Sep 27, 2016 who was on Xarelto PTA for atrial flutter. Unknown last dose, but today HL < 0.10 and aPTT 41s - OK to start heparin gtt. CBC stable, no bleeding documented. PICC was placed at 1400 today - will start heparin at 1800. Due to his morbid obesity, bolus dose would be > 9000 units of heparin - will just start infusion and check HL in 6 hours.  Goal of Therapy:  Heparin level 0.3-0.7  units/ml Monitor platelets by anticoagulation protocol: Yes   Plan:  Start heparin infusion at 2000 units/hr at 1800 Check anti-Xa level 6 hours after infusion starts and daily while on heparin Continue to monitor H&H and platelets  Mackie Paienee Inman Fettig, PharmD PGY1 Pharmacy Resident Pager: [phone] 06/19/2016 2:24 PM

## 2016-06-19 NOTE — Progress Notes (Signed)
Inpatient Diabetes Program Recommendations  AACE/ADA: New Consensus Statement on Inpatient Glycemic Control (2015)  Target Ranges:  Prepandial:   less than 140 mg/dL      Peak postprandial:   less than 180 mg/dL (1-2 hours)      Critically ill patients:  140 - 180 mg/dL   Lab Results  Component Value Date   GLUCAP 179 (H) 06/19/2016   HGBA1C 9.2 (H) 03/30/2016    Review of Glycemic Control  Inpatient Diabetes Program Recommendations:  Reviewed D/C orders from 04/03/16 and patient was prescribed to take 75/25 75 units ac breakfast           40 units ac lunch           75 units ac dinner Along with Aspart 8 units tid meal coverage+ Novolog 0-20 units tid with meals + 0-5 units hs Will follow and speak to pt. When appropriate.  Thank you, Evan FischerJudy E. Jackee Glasner, RN, MSN, CDE Inpatient Glycemic Control Team Team Pager [phone] (8am-5pm) 06/19/2016 10:53 AM

## 2016-06-19 NOTE — Care Management Note (Signed)
Case Management Note  Patient Details  Name: [name] MRN: [id number] Date of Birth: 07/20/1971  Subjective/Objective:    Pt presented to the ED via EMS with complaints of shortness of breath. He was placed on CPAP en route with little improvement and was intubated shortly after arrival here. He was also found to be markedly hyperglycemic with glucose > 700.                 Action/Plan:  PTA from home alone, on supplemental oxygen.  Pt is now intubated.  CM will continue to monitor for disposition needs   Expected Discharge Date:                  Expected Discharge Plan:     In-House Referral:     Discharge planning Services  CM Consult  Post Acute Care Choice:    Choice offered to:     DME Arranged:    DME Agency:     HH Arranged:    HH Agency:     Status of Service:  In process, will continue to follow  If discussed at Long Length of Stay Meetings, dates discussed:    Additional Comments:  Cherylann ParrClaxton, Jeani Fassnacht S, RN 06/19/2016, 2:33 PM

## 2016-06-19 NOTE — Progress Notes (Signed)
PULMONARY / CRITICAL CARE MEDICINE   Name: [name] MRN: [id number] DOB: 1971/01/30    ADMISSION DATE:  06/05/2016 CONSULTATION DATE: 06/04/2016  REFERRING MD:  EDP  CHIEF COMPLAINT:  Shortness of breath  Brief: 45 year old male with a past medical history significant for diastolic heart failure with recurrent admissions to the intensive care unit over the last 1-2 years for respiratory failure was admitted on 06/20/2016 with acute respiratory fire with hypoxemia and septic shock secondary to healthcare associated pneumonia.    SUBJECTIVE:  Vasopressors weaning Not on anticoagulation  VITAL SIGNS: BP 94/72   Pulse (!) 102   Temp (!) 100.5 F (38.1 C) (Oral)   Resp (!) 35   Ht 6\' 3"  (1.905 m)   Wt (!) 263.1 kg (580 lb)   SpO2 97%   BMI 72.50 kg/m   HEMODYNAMICS:    VENTILATOR SETTINGS: Vent Mode: PRVC FiO2 (%):  [80 %-100 %] 80 % Set Rate:  [25 bmp-35 bmp] 35 bmp Vt Set:  [510 mL-670 mL] 600 mL PEEP:  [12 cmH20-14 cmH20] 14 cmH20 Plateau Pressure:  [34 cmH20-38 cmH20] 36 cmH20  INTAKE / OUTPUT: I/O last 3 completed shifts: In: 40981 [I.V.:6419.5; NG/GT:20; IV Piggyback:4112.5] Out: 400 [Urine:400]  PHYSICAL EXAMINATION:   General: Morbidly obese, comfortable on vent HEENT endotracheal tube in place Pulmonary rhonchi bilaterally with vent supported breaths Cardiovascular tachycardic, regular rhythm GI belly soft, nontender nondistended Dermatologic no rash or skin breakdown, edema noted bilaterally Neurologic sedated on vent   LABS:  BMET  Recent Labs Lab 06/17/2016 1840 06/19/16 0404  NA 137 139  K 4.9 4.5  CL 101 107  CO2 19* 21*  BUN 32* 36*  CREATININE 2.42* 2.53*  GLUCOSE 733* 343*    Electrolytes  Recent Labs Lab 06/16/2016 1840 06/19/16 0404  CALCIUM 8.4* 7.8*  MG 1.8 1.9  PHOS  --  4.0    CBC  Recent Labs Lab 06/09/2016 1840 06/19/16 0404  WBC 18.9* 17.9*  HGB 14.3 13.0  HCT 43.2 40.3  PLT 217 195    Coag's  Recent  Labs Lab 06/13/2016 1840  INR 1.41    Sepsis Markers  Recent Labs Lab 06/17/2016 1856 06/23/2016 2202  LATICACIDVEN 6.99* 3.61*    ABG  Recent Labs Lab 06/17/2016 1937 06/13/2016 2334  PHART 7.285* 7.206*  PCO2ART 48.5* 55.5*  PO2ART 55.0* 61.0*    Liver Enzymes  Recent Labs Lab 06/19/2016 1840 06/19/16 0404  AST 232* 227*  ALT 154* 134*  ALKPHOS 128* 99  BILITOT 3.5* 1.6*  ALBUMIN 3.2* 2.8*    Cardiac Enzymes No results for input(s): TROPONINI, PROBNP in the last 168 hours.  Glucose  Recent Labs Lab 06/19/16 0240 06/19/16 0401 06/19/16 0452 06/19/16 0557 06/19/16 0655 06/19/16 0746  GLUCAP 375* 316* 293* 257* 225* 195*    Imaging Dg Chest Portable 1 View  Result Date: 06/07/2016 CLINICAL DATA:  Respiratory distress.  Endotracheal tube placement. EXAM: PORTABLE CHEST 1 VIEW COMPARISON:  04/01/2016 FINDINGS: Endotracheal tube has been placed with tip along the right side of the distal trachea 1.9 cm above the carina. Lungs are adequately inflated demonstrate mild interval worsening of patchy bilateral airspace consolidation worse over the right upper lobe. Possible left effusion. Stable cardiomegaly. Remainder the exam is unchanged. IMPRESSION: Moderate patchy bilateral airspace consolidation worse compared to the previous exam as findings may be due to multifocal pneumonia as cannot exclude a component of interstitial edema. Possible small left effusion. Cardiomegaly. Endotracheal tube with tip 1.9 cm  above the carina. Electronically Signed   By: Elberta Fortisaniel  Boyle M.D.   On: 06/30/2016 19:55    STUDIES:  CXR as above  CULTURES: Blood  9/17 >> Respiratory September 18>>    ANTIBIOTICS: Vancomycin 9/17 >> Zosyn 9/17 >>  SIGNIFICANT EVENTS:   LINES/TUBES: ETT 9/17 >> OGT 9/17 >> PIV  DISCUSSION: 45 year old male with morbid obesity here with septic shock secondary to healthcare acquired pneumonia leading to acute respiratory failure with hypoxemia  requiring full ventilatory support. Also has a history of diastolic heart failure  ASSESSMENT / PLAN:  PULMONARY A: Acute on chronic hypoxemic / hypercapneic respiratory failure  On O2 at home, prior labs suggest chronic CO2 retention Healthcare associated pneumonia  P:   Continue ARDS protocol Belly associated pneumonia prevention Decrease FiO2 to 60% Daily chest x-ray Wake up assessment when vent support appropriate  CARDIOVASCULAR A:  Hx atrial flutter Shock: Primarily due to medications at this point P:  Telemetry monitoring KVO IV fluids Change Levophed to Neo-Synephrine   RENAL A:   Acute kidney injury (Cr. 2.42 from 1.14 in 04/2016)  P:   Monitor BMET and UOP Replace electrolytes as needed  Place Foley  GASTROINTESTINAL A:   Acute liver injury - shock liver, improving P:   Start tube feedings Famotidine per tube for GI ppx Trend liver function  HEMATOLOGIC A:   No acute issues Chronic Xarelto P:  Start heparin after PICC line  INFECTIOUS A:   Sepsis secondary to healthcare associated pneumonia P:   Continue vancomycin and zosyn Send respiratory culture  ENDOCRINE A:   DM2, uncontrolled P:   Stop insulin drip Start subcutaneous insulin with long-acting Levemir and sliding scale  NEUROLOGIC A:   Altered mental status on presentation - likely secondary to hypoxia and hypercapnea P:   RASS goal: -2 Stop propofol Start fentanyl drip and as needed versed   FAMILY  - Updates: no family available at the time of my interview.   - Inter-disciplinary family meet or Palliative Care meeting due by:  day 7  CC time 3935  [name], MD [place] [facility] Pager: [phone] Cell: [phone] After 3pm or if no response, call [phone]   06/19/2016, 8:54 AM

## 2016-06-19 NOTE — Progress Notes (Signed)
Peripherally Inserted Central Catheter/Midline Placement  The IV Nurse has discussed with the patient and/or persons authorized to consent for the patient, the purpose of this procedure and the potential benefits and risks involved with this procedure.  The benefits include less needle sticks, lab draws from the catheter, and the patient may be discharged home with the catheter. Risks include, but not limited to, infection, bleeding, blood clot (thrombus formation), and puncture of an artery; nerve damage and irregular heartbeat and possibility to perform a PICC exchange if needed/ordered by physician.  Alternatives to this procedure were also discussed.  Bard Power PICC patient education guide, fact sheet on infection prevention and patient information card has been provided to patient /or left at bedside.    PICC/Midline Placement Documentation     Medical necessity given by Dr Max Fickleouglas McQuaid. Unable to reach patient sister to get and explain the risk and benefits of picc placement  Maximino GreenlandLumban, Ottis Vacha Albarece 06/19/2016, 2:26 PM

## 2016-06-19 NOTE — Progress Notes (Signed)
Initial Nutrition Assessment  DOCUMENTATION CODES:   Morbid obesity  INTERVENTION:    Initiate TF via OGT with Vital High Protein at goal rate of 110 ml/h (2640 ml per day) to provide 2640 kcals, 231 gm protein, 2207 ml free water daily.  NUTRITION DIAGNOSIS:   Inadequate oral intake related to inability to eat as evidenced by NPO status.  GOAL:   Provide needs based on ASPEN/SCCM guidelines  MONITOR:   TF tolerance, Vent status, Labs, I & O's  REASON FOR ASSESSMENT:   Consult Enteral/tube feeding initiation and management  ASSESSMENT:   60M with multiple medical problems who presents in acute on chronic hypoxemic /hypercapneic respiratory failure requiring intubation.    Patient is currently intubated on ventilator support MV: 20.2 L/min Temp (24hrs), Avg:99.4 F (37.4 C), Min:98.8 F (37.1 C), Max:100.5 F (38.1 C)  Propofol: stopped  Received MD Consult for TF initiation and management. Labs reviewed. CBG's: [phone] Medications reviewed and include insulin. Patient is morbidly obese with BMI=72.5  Diet Order:  Diet NPO time specified  Skin:  Reviewed, no issues  Last BM:  unknown  Height:   Ht Readings from Last 1 Encounters:  06/19/2016 6\' 3"  (1.905 m)    Weight:   Wt Readings from Last 1 Encounters:  06/19/16 (!) 580 lb (263.1 kg)    Ideal Body Weight:  89.1 kg  BMI:  Body mass index is 72.5 kg/m.  Estimated Nutritional Needs:   Kcal:  2800-3000  Protein:  222 gm  Fluid:  3 L  EDUCATION NEEDS:   No education needs identified at this time  Joaquin CourtsKimberly Harris, RD, LDN, CNSC Pager [phone] After Hours Pager [phone]

## 2016-06-19 NOTE — Progress Notes (Signed)
[facility] PCCM  CXR reviewed, PICC in high SVC, OK to continue use  Heber CarolinaBrent McQuaid, MD [place] [facility] Pager: [phone] Cell: [phone] After 3pm or if no response, call [phone]

## 2016-06-19 NOTE — Progress Notes (Signed)
Tube feedings not started per ultrasound requested to keep him npo for abdominal ultrasound

## 2016-06-20 ENCOUNTER — Inpatient Hospital Stay (HOSPITAL_COMMUNITY): Payer: Medicare Other

## 2016-06-20 DIAGNOSIS — J80 Acute respiratory distress syndrome: Secondary | ICD-10-CM

## 2016-06-20 LAB — HEPATIC FUNCTION PANEL
ALT: 129 U/L — AB (ref 17–63)
AST: 271 U/L — ABNORMAL HIGH (ref 15–41)
Albumin: 2.6 g/dL — ABNORMAL LOW (ref 3.5–5.0)
Alkaline Phosphatase: 81 U/L (ref 38–126)
BILIRUBIN DIRECT: 0.5 mg/dL (ref 0.1–0.5)
BILIRUBIN INDIRECT: 0.8 mg/dL (ref 0.3–0.9)
TOTAL PROTEIN: 6.1 g/dL — AB (ref 6.5–8.1)
Total Bilirubin: 1.3 mg/dL — ABNORMAL HIGH (ref 0.3–1.2)

## 2016-06-20 LAB — CBC WITH DIFFERENTIAL/PLATELET
BASOS ABS: 0 10*3/uL (ref 0.0–0.1)
BASOS PCT: 0 %
EOS ABS: 0.1 10*3/uL (ref 0.0–0.7)
Eosinophils Relative: 1 %
HEMATOCRIT: 35.3 % — AB (ref 39.0–52.0)
HEMOGLOBIN: 11.5 g/dL — AB (ref 13.0–17.0)
Lymphocytes Relative: 16 %
Lymphs Abs: 2.3 10*3/uL (ref 0.7–4.0)
MCH: 27 pg (ref 26.0–34.0)
MCHC: 32.6 g/dL (ref 30.0–36.0)
MCV: 82.9 fL (ref 78.0–100.0)
MONO ABS: 0.9 10*3/uL (ref 0.1–1.0)
MONOS PCT: 6 %
NEUTROS ABS: 11 10*3/uL — AB (ref 1.7–7.7)
NEUTROS PCT: 77 %
Platelets: 179 10*3/uL (ref 150–400)
RBC: 4.26 MIL/uL (ref 4.22–5.81)
RDW: 16.1 % — AB (ref 11.5–15.5)
WBC: 14.3 10*3/uL — ABNORMAL HIGH (ref 4.0–10.5)

## 2016-06-20 LAB — BLOOD GAS, ARTERIAL
ACID-BASE EXCESS: 2.7 mmol/L — AB (ref 0.0–2.0)
BICARBONATE: 27.1 mmol/L (ref 20.0–28.0)
DRAWN BY: 274071
FIO2: 70
LHR: 35 {breaths}/min
MECHVT: 500 mL
O2 SAT: 95.7 %
PCO2 ART: 44.6 mmHg (ref 32.0–48.0)
PEEP/CPAP: 12 cmH2O
PH ART: 7.401 (ref 7.350–7.450)
PO2 ART: 85.7 mmHg (ref 83.0–108.0)
TCO2: 28.5 mmol/L (ref 0–100)

## 2016-06-20 LAB — GLUCOSE, CAPILLARY
GLUCOSE-CAPILLARY: 190 mg/dL — AB (ref 65–99)
GLUCOSE-CAPILLARY: 179 mg/dL — AB (ref 65–99)
GLUCOSE-CAPILLARY: 174 mg/dL — AB (ref 65–99)
Glucose-Capillary: 183 mg/dL — ABNORMAL HIGH (ref 65–99)
Glucose-Capillary: 162 mg/dL — ABNORMAL HIGH (ref 65–99)
Glucose-Capillary: 163 mg/dL — ABNORMAL HIGH (ref 65–99)

## 2016-06-20 LAB — BASIC METABOLIC PANEL
ANION GAP: 7 (ref 5–15)
BUN: 27 mg/dL — ABNORMAL HIGH (ref 6–20)
CALCIUM: 8.1 mg/dL — AB (ref 8.9–10.3)
CO2: 28 mmol/L (ref 22–32)
CREATININE: 1.64 mg/dL — AB (ref 0.61–1.24)
Chloride: 109 mmol/L (ref 101–111)
GFR calc Af Amer: 57 mL/min — ABNORMAL LOW (ref >60)
GFR, EST NON AFRICAN AMERICAN: 49 mL/min — AB (ref >60)
Glucose, Bld: 202 mg/dL — ABNORMAL HIGH (ref 65–99)
Potassium: 3.6 mmol/L (ref 3.5–5.1)
Sodium: 144 mmol/L (ref 135–145)

## 2016-06-20 LAB — PHOSPHORUS
PHOSPHORUS: 2 mg/dL — AB (ref 2.5–4.6)
PHOSPHORUS: 2.9 mg/dL (ref 2.5–4.6)

## 2016-06-20 LAB — HEPARIN LEVEL (UNFRACTIONATED)
HEPARIN UNFRACTIONATED: 0.26 [IU]/mL — AB (ref 0.30–0.70)
HEPARIN UNFRACTIONATED: 0.16 [IU]/mL — AB (ref 0.30–0.70)
Heparin Unfractionated: 0.34 IU/mL (ref 0.30–0.70)

## 2016-06-20 LAB — URINE CULTURE: Culture: <10000 — AB

## 2016-06-20 LAB — MAGNESIUM
MAGNESIUM: 1.8 mg/dL (ref 1.7–2.4)
Magnesium: 1.8 mg/dL (ref 1.7–2.4)

## 2016-06-20 MED ORDER — INSULIN DETEMIR 100 UNIT/ML ~~LOC~~ SOLN
55.0000 [IU] | Freq: Two times a day (BID) | SUBCUTANEOUS | Status: DC
  Administered 2016-06-20 – 2016-06-27 (×13): 55 [IU] via SUBCUTANEOUS
  Filled 2016-06-20 (×15): qty 0.55

## 2016-06-20 MED ORDER — HEPARIN BOLUS VIA INFUSION
3000.0000 [IU] | Freq: Once | INTRAVENOUS | Status: AC
  Administered 2016-06-20: 3000 [IU] via INTRAVENOUS
  Filled 2016-06-20: qty 3000

## 2016-06-20 MED ORDER — FUROSEMIDE 10 MG/ML IJ SOLN
20.0000 mg | Freq: Two times a day (BID) | INTRAMUSCULAR | Status: DC
  Administered 2016-06-20 (×2): 20 mg via INTRAVENOUS
  Filled 2016-06-20 (×3): qty 2

## 2016-06-20 MED ORDER — VITAL HIGH PROTEIN PO LIQD
1000.0000 mL | ORAL | Status: DC
  Administered 2016-06-21 – 2016-06-25 (×5): 1000 mL

## 2016-06-20 NOTE — Progress Notes (Signed)
ANTICOAGULATION CONSULT NOTE  Pharmacy Consult for heparin Indication: atrial flutter, h/o PE  Allergies  Allergen Reactions  . Iodine Anaphylaxis and Other (See Comments)    Associated with the shellfish allergy  . Shellfish Allergy Anaphylaxis    Patient Measurements: Height: 6\' 3"  (190.5 cm) Weight: (!) 581 lb (263.5 kg) IBW/kg (Calculated) : 84.5 Heparin Dosing Weight: 152.8 kg  Vital Signs: Temp: 98.7 F (37.1 C) (09/19 1550) Temp Source: Oral (09/19 1550) BP: 128/77 (09/19 1700) Pulse Rate: 138 (09/19 1700)  Labs:  Recent Labs  06/23/2016 1840 06/19/16 0404  06/19/16 0922 06/19/16 1611 06/20/16 0036 06/20/16 0327 06/20/16 0745 06/20/16 1657  HGB 14.3 13.0  --   --   --   --  11.5*  --   --   HCT 43.2 40.3  --   --   --   --  35.3*  --   --   PLT 217 195  --   --   --   --  179  --   --   APTT  --   --   --  41*  --   --   --   --   --   LABPROT 17.4*  --   --   --   --   --   --   --   --   INR 1.41  --   --   --   --   --   --   --   --   HEPARINUNFRC  --   --   < > <0.10*  --  0.16*  --  0.26* 0.34  CREATININE 2.42* 2.53*  --   --  2.22*  --  1.64*  --   --   < > = values in this interval not displayed.  Estimated Creatinine Clearance: 125.6 mL/min (by C-G formula based on SCr of 1.64 mg/dL (H)).   Medical History: Past Medical History:  Diagnosis Date  . Abscess    right thigh  . Asthma   . Atrial fibrillation (HCC)   . Atrial flutter (HCC)   . Cellulitis of thigh 10/12/2011  . CHF (congestive heart failure) (HCC)    EF55-60%  . Chronic low back pain   . Chronic respiratory failure (HCC)   . CKD (chronic kidney disease), stage II   . Depression   . DM2 (diabetes mellitus, type 2) (HCC)   . HTN (hypertension)   . Morbid obesity (HCC)   . Obesity hypoventilation syndrome (HCC)   . Patellar tendon rupture 12/18/2014  . Ptosis   . Sleep apnea   . Vision abnormalities     Assessment: 44yo M admitted on 06/17/2016 who was on Xarelto PTA for  atrial flutter/hx of PE to continue on heparin gtt. CBC stable, no bleeding documented.   Heparin level therapeutic at 0.34 on 2800 units/hr  Goal of Therapy:  Heparin level 0.3-0.7 units/ml Monitor platelets by anticoagulation protocol: Yes   Plan:  Continue heparin infusion to 2800 units/hr Confirm heparin level at 0100 Daily Heparin level and CBC Continue to monitor H&H and platelets  Ruben Imony Almeter Westhoff, PharmD Clinical Pharmacist Pager: [phone] 06/20/2016 5:28 PM

## 2016-06-20 NOTE — Progress Notes (Signed)
ANTICOAGULATION CONSULT NOTE Pharmacy Consult for heparin Indication: atrial flutter  Allergies  Allergen Reactions  . Iodine Anaphylaxis and Other (See Comments)    Associated with the shellfish allergy  . Shellfish Allergy Anaphylaxis    Patient Measurements: Height: 6\' 3"  (190.5 cm) Weight: (!) 580 lb (263.1 kg) IBW/kg (Calculated) : 84.5 Heparin Dosing Weight: 152.8 kg  Vital Signs: Temp: 98.6 F (37 C) (09/18 2353) Temp Source: Axillary (09/18 2353) BP: 125/71 (09/19 0130) Pulse Rate: 86 (09/19 0130)  Labs:  Recent Labs  06/09/2016 1840 06/19/16 0404 06/19/16 0922 06/19/16 1611 06/20/16 0036  HGB 14.3 13.0  --   --   --   HCT 43.2 40.3  --   --   --   PLT 217 195  --   --   --   APTT  --   --  41*  --   --   LABPROT 17.4*  --   --   --   --   INR 1.41  --   --   --   --   HEPARINUNFRC  --   --  <0.10*  --  0.16*  CREATININE 2.42* 2.53*  --  2.22*  --     Estimated Creatinine Clearance: 92.7 mL/min (by C-G formula based on SCr of 2.22 mg/dL (H)).  Assessment: 45 y.o. male with h/o PE/Afib, Xarelto on hold, for heparin  Goal of Therapy:  Heparin level 0.3-0.7 units/ml Monitor platelets by anticoagulation protocol: Yes   Plan:  Heparin 3000 units IV bolus, then increase heparin  2500 units/hr Check heparin level in 6 hours.  Geannie RisenGreg Jaylani Mcguinn, PharmD, BCPS  06/20/2016 1:44 AM

## 2016-06-20 NOTE — Progress Notes (Signed)
PULMONARY / CRITICAL CARE MEDICINE   Name: [name] MRN: [id number] DOB: Jun 20, 1971    ADMISSION DATE:  06/16/2016 CONSULTATION DATE: 06/26/2016  REFERRING MD:  EDP  CHIEF COMPLAINT:  Shortness of breath  Brief: 45 year old male with a past medical history significant for diastolic heart failure with recurrent admissions to the intensive care unit over the last 1-2 years for respiratory failure was admitted on 06/08/2016 with acute respiratory fire with hypoxemia and septic shock secondary to healthcare associated pneumonia.   SUBJECTIVE:   Intermittent pressor use Now on heparin for AF High vent pressures, oxygenating better  VITAL SIGNS: BP 117/65   Pulse (!) 141   Temp (!) 100.6 F (38.1 C) (Oral)   Resp (!) 35   Ht 6\' 3"  (1.905 m)   Wt (!) 263.5 kg (581 lb)   SpO2 95%   BMI 72.62 kg/m   HEMODYNAMICS:    VENTILATOR SETTINGS: Vent Mode: PRVC FiO2 (%):  [70 %-80 %] 70 % Set Rate:  [35 bmp] 35 bmp Vt Set:  [600 mL] 600 mL PEEP:  [12 cmH20-14 cmH20] 12 cmH20 Plateau Pressure:  [16 cmH20-35 cmH20] 35 cmH20  INTAKE / OUTPUT: I/O last 3 completed shifts: In: 13305.9 [I.V.:8380.9; NG/GT:100; IV Piggyback:4825] Out: 3930 [Urine:2880; Emesis/NG output:1050]  PHYSICAL EXAMINATION: General: Morbidly obese, comfortable/synchronous on vent HEENT: Endotracheal tube in place Pulmonary: rhonchi bilaterally with vent supported breaths Cardiovascular: tachycardic, regular rhythm GI: belly soft, nontender, nondistended Dermatologic: no rash or skin breakdown, edema noted bilaterally Neurologic: sedated on vent, RASS -2 to -3   LABS:  BMET  Recent Labs Lab 06/19/16 0404 06/19/16 1611 06/20/16 0327  NA 139 139 144  K 4.5 4.1 3.6  CL 107 107 109  CO2 21* 24 28  BUN 36* 36* 27*  CREATININE 2.53* 2.22* 1.64*  GLUCOSE 343* 222* 202*    Electrolytes  Recent Labs Lab 06/19/16 0404 06/19/16 0922 06/19/16 1611 06/20/16 0327  CALCIUM 7.8*  --  7.7* 8.1*  MG 1.9  1.9 1.9 1.8  PHOS 4.0 3.1 3.3 2.0*    CBC  Recent Labs Lab 06/21/2016 1840 06/19/16 0404 06/20/16 0327  WBC 18.9* 17.9* 14.3*  HGB 14.3 13.0 11.5*  HCT 43.2 40.3 35.3*  PLT 217 195 179    Coag's  Recent Labs Lab 06/13/2016 1840 06/19/16 0922  APTT  --  41*  INR 1.41  --     Sepsis Markers  Recent Labs Lab 06/04/2016 1856 06/20/2016 2202  LATICACIDVEN 6.99* 3.61*    ABG  Recent Labs Lab 06/22/2016 1937 06/24/2016 2334 06/19/16 0918  PHART 7.285* 7.206* 7.407  PCO2ART 48.5* 55.5* 40.4  PO2ART 55.0* 61.0* 77.0*    Liver Enzymes  Recent Labs Lab 06/11/2016 1840 06/19/16 0404 06/20/16 0327  AST 232* 227* 271*  ALT 154* 134* 129*  ALKPHOS 128* 99 81  BILITOT 3.5* 1.6* 1.3*  ALBUMIN 3.2* 2.8* 2.6*    Cardiac Enzymes No results for input(s): TROPONINI, PROBNP in the last 168 hours.  Glucose  Recent Labs Lab 06/19/16 1124 06/19/16 1523 06/19/16 1931 06/19/16 2355 06/20/16 0318 06/20/16 0905  GLUCAP 142* 213* 209* 202* 190* 183*    Imaging Dg Chest Port 1 View  Result Date: 06/20/2016 CLINICAL DATA:  Acute respiratory failure with hypoxemia EXAM: PORTABLE CHEST 1 VIEW COMPARISON:  06/19/2016 FINDINGS: Right PICC line tip remains in the right innominate vein. Endotracheal tube is unchanged. Diffuse bilateral airspace disease, right greater than left. Heart is borderline enlarged. Low lung volumes. Suspect bilateral  effusions appear IMPRESSION: Bilateral airspace disease, right greater than left could reflect asymmetric edema or infection. Suspect small bilateral effusions. Electronically Signed   By: Charlett Nose M.D.   On: 06/20/2016 07:45   Dg Chest Port 1 View  Result Date: 06/19/2016 CLINICAL DATA:  45 year old male PICC line placement. Initial encounter. EXAM: PORTABLE CHEST 1 VIEW COMPARISON:  1452 hours today and earlier. FINDINGS: Portable AP semi upright view at at 1605 hours. A right upper extremity PICC line is in place, an the guidewire is  inserted to oval level of the medial right subclavian vein. However, the catheter can be followed further into the mediastinum and appears to terminated at the level of the endotracheal tube corresponding to the SVC. Stable visualized mediastinal contour. Stable visualized ventilation. Stable endotracheal tube tip. Enteric tube remains in place, tip not included. IMPRESSION: 1. Right upper extremity PICC line appears to terminated at the level of the SVC. The guidewire is in place to the level of the subclavian vein. 2. Otherwise stable visualized chest. Electronically Signed   By: Odessa Fleming M.D.   On: 06/19/2016 16:18   Dg Chest Port 1 View  Result Date: 06/19/2016 CLINICAL DATA:  Right PICC line inserted. EXAM: PORTABLE CHEST 1 VIEW COMPARISON:  06/07/2016. FINDINGS: Limited exam due to positioning difficulty. Endotracheal tube noted in stable position. Interim placement of NG tube, its tip is below the left hemidiaphragm. Interim placement of PICC line, its tip is over the right subclavian vein. Cardiomegaly. Bilateral pulmonary infiltrates and or edema. IMPRESSION: Limited exam due to positioning difficulty. PICC line noted with tip projected over the right subclavian vein. Electronically Signed   By: Maisie Fus  Register   On: 06/19/2016 15:04   Dg Abd Portable 1v  Result Date: 06/19/2016 CLINICAL DATA:  Nasogastric tube placement. EXAM: PORTABLE ABDOMEN - 1 VIEW COMPARISON:  Chest radiograph 06/07/2016. FINDINGS: Nasogastric tube tip is seen in the left upper quadrant. Overall exam quality is limited by body habitus. IMPRESSION: Nasogastric tip is seen in the left upper quadrant. Electronically Signed   By: Leanna Battles M.D.   On: 06/19/2016 12:49   US Abdomen Limited Ruq  Result Date: 06/19/2016 CLINICAL DATA:  Abnormal liver enzymes. EXAM: US ABDOMEN LIMITED - RIGHT UPPER QUADRANT COMPARISON:  03/31/2016 FINDINGS: Gallbladder: Reportedly surgically absent. Common bile duct: Diameter: 6 mm where seen  Liver: Mainly nonvisualized liver due to patient size and difficulty with positioning on the ventilator. No detected pathology. IMPRESSION: Nondiagnostic evaluation of the liver due to patient size. No detected abnormality. Electronically Signed   By: Marnee Spring M.D.   On: 06/19/2016 19:45    STUDIES:  Abd [place] 9/18 > non-diagnostic due to patient size.  CULTURES: Blood  9/17 >> Respiratory 9/18>>   ANTIBIOTICS: Vancomycin 9/17 >> Zosyn 9/17 >>  SIGNIFICANT EVENTS:   LINES/TUBES: ETT 9/17 >> OGT 9/17 >> PIV  DISCUSSION: 45 year old male with morbid obesity here with septic shock secondary to healthcare acquired pneumonia leading to acute respiratory failure with hypoxemia requiring full ventilatory support. Also has a history of diastolic heart failure. Requiring a modified ARDS protocol.  ASSESSMENT / PLAN:  PULMONARY A: Acute on chronic hypoxemic / hypercapneic respiratory failure  Healthcare associated pneumonia On O2 at home, prior labs suggest chronic CO2 retention P:   Continue ARDS protocol Ventilator associated pneumonia prevention Repeat ABG now to determine if we can drop Rate or preferably Vt as high Plat pressures. Will also target deeper sedation to assist with pressures, add  versed infusion. Daily chest x-ray Wake up assessment when vent support appropriate  CARDIOVASCULAR A:  Shock: Primarily due to sedating medications at this point AFRVR with Hx atrial flutter P:  Telemetry monitoring KVO IV fluids Neo to keep MAP > 65 Goal negative balance, will start low dose IV lasix If RVR not controlled with diuresis may need to consider amiodarone gtt or adding back some low dose BB   RENAL A:   Acute kidney injury (Cr. 2.42 from 1.14 in 04/2016)  P:   Monitor BMET and UOP Replace electrolytes as needed  GASTROINTESTINAL A:   Acute liver injury - worsening despite BP control High output NGT P:   Hold TF due to high output NGT. Plain abdominal  film to assess ileus.  Famotidine per tube for GI ppx Trend liver function KUB to assess Ileus.  HEMATOLOGIC A:   No acute issues Chronic Xarelto P:  Start heparin after PICC line  INFECTIOUS A:   Sepsis secondary to healthcare associated pneumonia P:   Continue vancomycin and zosyn Send respiratory culture  ENDOCRINE A:   DM2, uncontrolled P:   Stop insulin drip Start subcutaneous insulin with long-acting Levemir and sliding scale > increase Levemir dose to 55units  NEUROLOGIC A:   Altered mental status on presentation - likely secondary to hypoxia and hypercapnea P:   RASS goal: -2 Start fentanyl drip and as needed versed   FAMILY  - Updates: no family present  - Inter-disciplinary family meet or Palliative Care meeting due by:  day 7   Joneen RoachPaul Hoffman, AGACNP-BC [facility] Pulmonology/Critical Care Pager [phone] or [phone]  06/20/2016 10:36 AM

## 2016-06-20 NOTE — Progress Notes (Signed)
ANTICOAGULATION CONSULT NOTE  Pharmacy Consult for heparin Indication: atrial flutter, h/o PE  Allergies  Allergen Reactions  . Iodine Anaphylaxis and Other (See Comments)    Associated with the shellfish allergy  . Shellfish Allergy Anaphylaxis    Patient Measurements: Height: 6\' 3"  (190.5 cm) Weight: (!) 581 lb (263.5 kg) IBW/kg (Calculated) : 84.5 Heparin Dosing Weight: 152.8 kg  Vital Signs: Temp: 100.6 F (38.1 C) (09/19 0908) Temp Source: Oral (09/19 0908) BP: 117/65 (09/19 0700) Pulse Rate: 141 (09/19 0759)  Labs:  Recent Labs  12-15-2015 1840 06/19/16 0404 06/19/16 0922 06/19/16 1611 06/20/16 0036 06/20/16 0327 06/20/16 0745  HGB 14.3 13.0  --   --   --  11.5*  --   HCT 43.2 40.3  --   --   --  35.3*  --   PLT 217 195  --   --   --  179  --   APTT  --   --  41*  --   --   --   --   LABPROT 17.4*  --   --   --   --   --   --   INR 1.41  --   --   --   --   --   --   HEPARINUNFRC  --   --  <0.10*  --  0.16*  --  0.26*  CREATININE 2.42* 2.53*  --  2.22*  --  1.64*  --     Estimated Creatinine Clearance: 125.6 mL/min (by C-G formula based on SCr of 1.64 mg/dL (H)).   Medical History: Past Medical History:  Diagnosis Date  . Abscess    right thigh  . Asthma   . Atrial fibrillation (HCC)   . Atrial flutter (HCC)   . Cellulitis of thigh 10/12/2011  . CHF (congestive heart failure) (HCC)    EF55-60%  . Chronic low back pain   . Chronic respiratory failure (HCC)   . CKD (chronic kidney disease), stage II   . Depression   . DM2 (diabetes mellitus, type 2) (HCC)   . HTN (hypertension)   . Morbid obesity (HCC)   . Obesity hypoventilation syndrome (HCC)   . Patellar tendon rupture 12/18/2014  . Ptosis   . Sleep apnea   . Vision abnormalities     Assessment: 45yo M admitted on 10-21-15 who was on Xarelto PTA for atrial flutter/hx of PE to continue on heparin gtt. CBC stable, no bleeding documented. HL today subtherapeutic at 0.26 on 2500  units/hr  Goal of Therapy:  Heparin level 0.3-0.7 units/ml Monitor platelets by anticoagulation protocol: Yes   Plan:  Increase heparin infusion to 2800 units/hr Check anti-Xa level 8 hours and daily while on heparin Continue to monitor H&H and platelets  Mackie Paienee Astin Rape, PharmD PGY1 Pharmacy Resident Pager: [phone] 06/20/2016 10:11 AM

## 2016-06-21 ENCOUNTER — Inpatient Hospital Stay (HOSPITAL_COMMUNITY): Payer: Medicare Other

## 2016-06-21 DIAGNOSIS — I5031 Acute diastolic (congestive) heart failure: Secondary | ICD-10-CM

## 2016-06-21 LAB — GLUCOSE, CAPILLARY
GLUCOSE-CAPILLARY: 176 mg/dL — AB (ref 65–99)
GLUCOSE-CAPILLARY: 145 mg/dL — AB (ref 65–99)
Glucose-Capillary: 169 mg/dL — ABNORMAL HIGH (ref 65–99)
Glucose-Capillary: 181 mg/dL — ABNORMAL HIGH (ref 65–99)
Glucose-Capillary: 166 mg/dL — ABNORMAL HIGH (ref 65–99)
Glucose-Capillary: 155 mg/dL — ABNORMAL HIGH (ref 65–99)

## 2016-06-21 LAB — HEPATIC FUNCTION PANEL
ALK PHOS: 66 U/L (ref 38–126)
ALT: 139 U/L — AB (ref 17–63)
AST: 278 U/L — AB (ref 15–41)
Albumin: 2.2 g/dL — ABNORMAL LOW (ref 3.5–5.0)
BILIRUBIN INDIRECT: 0.7 mg/dL (ref 0.3–0.9)
Bilirubin, Direct: 0.3 mg/dL (ref 0.1–0.5)
TOTAL PROTEIN: 5.5 g/dL — AB (ref 6.5–8.1)
Total Bilirubin: 1 mg/dL (ref 0.3–1.2)

## 2016-06-21 LAB — CBC
HCT: 33.6 % — ABNORMAL LOW (ref 39.0–52.0)
HEMOGLOBIN: 10.7 g/dL — AB (ref 13.0–17.0)
MCH: 27.2 pg (ref 26.0–34.0)
MCHC: 31.8 g/dL (ref 30.0–36.0)
MCV: 85.3 fL (ref 78.0–100.0)
PLATELETS: 167 10*3/uL (ref 150–400)
RBC: 3.94 MIL/uL — AB (ref 4.22–5.81)
RDW: 16.5 % — ABNORMAL HIGH (ref 11.5–15.5)
WBC: 12.9 10*3/uL — ABNORMAL HIGH (ref 4.0–10.5)

## 2016-06-21 LAB — HEPARIN LEVEL (UNFRACTIONATED)
HEPARIN UNFRACTIONATED: 0.44 [IU]/mL (ref 0.30–0.70)
HEPARIN UNFRACTIONATED: 0.41 [IU]/mL (ref 0.30–0.70)

## 2016-06-21 LAB — CULTURE, RESPIRATORY W GRAM STAIN: Culture: NORMAL

## 2016-06-21 LAB — BASIC METABOLIC PANEL
ANION GAP: 4 — AB (ref 5–15)
BUN: 21 mg/dL — AB (ref 6–20)
CALCIUM: 7.4 mg/dL — AB (ref 8.9–10.3)
CO2: 27 mmol/L (ref 22–32)
Chloride: 113 mmol/L — ABNORMAL HIGH (ref 101–111)
Creatinine, Ser: 1.37 mg/dL — ABNORMAL HIGH (ref 0.61–1.24)
GFR calc Af Amer: >60 mL/min (ref >60)
Glucose, Bld: 178 mg/dL — ABNORMAL HIGH (ref 65–99)
POTASSIUM: 3.7 mmol/L (ref 3.5–5.1)
SODIUM: 144 mmol/L (ref 135–145)

## 2016-06-21 LAB — VANCOMYCIN, TROUGH: Vancomycin Tr: 14 ug/mL — ABNORMAL LOW (ref 15–20)

## 2016-06-21 LAB — CULTURE, RESPIRATORY: SPECIAL REQUESTS: NORMAL

## 2016-06-21 MED ORDER — FUROSEMIDE 10 MG/ML IJ SOLN
40.0000 mg | Freq: Four times a day (QID) | INTRAMUSCULAR | Status: AC
  Administered 2016-06-21 (×2): 40 mg via INTRAVENOUS
  Filled 2016-06-21 (×3): qty 4

## 2016-06-21 NOTE — Progress Notes (Signed)
Called to the room per RN that vent was alarming, on arrival noted PIP ramped up to the upper 50's and low 60's (58-61). Breath sounds very diminished decrease aeration throughout. ETT is in correct placement, no obstruction noted, suctioning catheter passes without complications, filters and HME changed to further make sure no obstruction is noted. PIP are still up. Pt was manually ventilated 100% bagged lavaged suctioned several times, got back small to moderate amount of light tan and clear secretions. PIP are still unchanged. Pt was repositioned in the bed, PIP are still unchanged. Pt is stable, but his Peak Inspiratory pressure are very high from his baseline which has been 32-34cmh20 all night. Call the Box related the event of increase PIP and suggested a Chest X-ray MD agree, and states he will order CXR. RN aware of event and MD CXR order.

## 2016-06-21 NOTE — Care Management Important Message (Signed)
Important Message  Patient Details  Name: [name] MRN: [id number] Date of Birth: 03/26/1971   Medicare Important Message Given:  Yes    Derrek Puff Abena 06/21/2016, 9:22 AM

## 2016-06-21 NOTE — Progress Notes (Signed)
PULMONARY / CRITICAL CARE MEDICINE   Name: [name] MRN: [id number] DOB: 07-01-1971    ADMISSION DATE:  07-06-16 CONSULTATION DATE: Jul 06, 2016  REFERRING MD:  EDP  CHIEF COMPLAINT:  Shortness of breath  Brief: 45 year old male with a past medical history significant for diastolic heart failure with recurrent admissions to the intensive care unit over the last 1-2 years for respiratory failure was admitted on 07-06-16 with acute respiratory fire with hypoxemia and septic shock secondary to healthcare associated pneumonia.   SUBJECTIVE:  Oxygenation improving Blood glucose better  VITAL SIGNS: BP 114/66 (BP Location: Left Arm)   Pulse (!) 114   Temp 99.5 F (37.5 C) (Oral)   Resp (!) 35   Ht 6\' 3"  (1.905 m)   Wt (!) 255.4 kg (563 lb)   SpO2 94%   BMI 70.37 kg/m   HEMODYNAMICS:    VENTILATOR SETTINGS: Vent Mode: PRVC FiO2 (%):  [60 %-70 %] 60 % Set Rate:  [35 bmp] 35 bmp Vt Set:  [500 mL] 500 mL PEEP:  [10 cmH20-12 cmH20] 10 cmH20 Plateau Pressure:  [29 cmH20-49 cmH20] 29 cmH20  INTAKE / OUTPUT: I/O last 3 completed shifts: In: 5172.4 [I.V.:2872.4; NG/GT:1250; IV Piggyback:1050] Out: 5580 [Urine:4280; Emesis/NG output:1300]  PHYSICAL EXAMINATION:   General: Morbidly obese, comfortable on vent HEENT: endotracheal tube in place Pulmonary rhonchi bilaterally with vent supported breaths Cardiovascular tachycardic, regular rhythm GI belly soft, nontender nondistended Dermatologic no rash or skin breakdown, edema noted bilaterally Neurologic sedated on vent   LABS:  BMET  Recent Labs Lab 06/19/16 1611 06/20/16 0327 06/21/16 0450  NA 139 144 144  K 4.1 3.6 3.7  CL 107 109 113*  CO2 24 28 27   BUN 36* 27* 21*  CREATININE 2.22* 1.64* 1.37*  GLUCOSE 222* 202* 178*    Electrolytes  Recent Labs Lab 06/19/16 1611 06/20/16 0327 06/20/16 1656 06/21/16 0450  CALCIUM 7.7* 8.1*  --  7.4*  MG 1.9 1.8 1.8  --   PHOS 3.3 2.0* 2.9  --      CBC  Recent Labs Lab 06/19/16 0404 06/20/16 0327 06/21/16 0450  WBC 17.9* 14.3* 12.9*  HGB 13.0 11.5* 10.7*  HCT 40.3 35.3* 33.6*  PLT 195 179 167    Coag's  Recent Labs Lab Jul 06, 2016 1840 06/19/16 0922  APTT  --  41*  INR 1.41  --     Sepsis Markers  Recent Labs Lab 07-06-2016 1856 07/06/2016 2202  LATICACIDVEN 6.99* 3.61*    ABG  Recent Labs Lab 2016-07-06 2334 06/19/16 0918 06/20/16 1100  PHART 7.206* 7.407 7.401  PCO2ART 55.5* 40.4 44.6  PO2ART 61.0* 77.0* 85.7    Liver Enzymes  Recent Labs Lab 06-Jul-2016 1840 06/19/16 0404 06/20/16 0327  AST 232* 227* 271*  ALT 154* 134* 129*  ALKPHOS 128* 99 81  BILITOT 3.5* 1.6* 1.3*  ALBUMIN 3.2* 2.8* 2.6*    Cardiac Enzymes No results for input(s): TROPONINI, PROBNP in the last 168 hours.  Glucose  Recent Labs Lab 06/20/16 1223 06/20/16 1549 06/20/16 1917 06/20/16 2301 06/21/16 0355 06/21/16 0709  GLUCAP 162* 179* 174* 163* 169* 181*    Imaging Dg Chest Port 1 View  Result Date: 06/21/2016 CLINICAL DATA:  Initial evaluation for acute respiratory failure. EXAM: PORTABLE CHEST 1 VIEW COMPARISON:  Prior radiograph from 06/20/2016. FINDINGS: Patient is intubated with the tip of the endotracheal tube positioned 18 mm above the carina. Enteric tube courses in the the abdomen. Cardiomegaly is stable. Mediastinal silhouette within normal limits.  Slight interval improvement in bilateral airspace disease, right greater than left. Lungs are mildly hypoinflated. Bilateral pleural effusions are suspected. No pneumothorax. Osseous structures unchanged. IMPRESSION: 1. Tip of the endotracheal tube 18 mm above the carina. 2. Slight interval improvement and bilateral airspace disease, right greater than left. Again, findings may reflect asymmetric edema or infection. 3. Persistent small bilateral pleural effusions suspected. Electronically Signed   By: Rise MuBenjamin  McClintock M.D.   On: 06/21/2016 06:57   Dg Abd  Portable 1v  Result Date: 06/20/2016 CLINICAL DATA:  High nasogastric tube residuals.  Ileus. EXAM: PORTABLE ABDOMEN - 1 VIEW COMPARISON:  None. FINDINGS: There is relative paucity of bowel gas. There is no bowel dilatation. No radio-opaque calculi or other significant radiographic abnormality are seen. IMPRESSION: No evidence bowel obstruction. Electronically Signed   By: Elige KoHetal  Patel   On: 06/20/2016 14:48    STUDIES:  CXR as above  CULTURES: Blood  9/17 >> Respiratory September 18>>    ANTIBIOTICS: Vancomycin 9/17 >> 9/20 Zosyn 9/17 >> 7 days  SIGNIFICANT EVENTS:   LINES/TUBES: ETT 9/17 >> OGT 9/17 >> PIV PICC 9/18 >   DISCUSSION: [age] year old male with morbid obesity here with septic shock secondary to healthcare acquired pneumonia leading to acute respiratory failure with hypoxemia requiring full ventilatory support. Also has a history of diastolic heart failure.  Improving.  ASSESSMENT / PLAN:  PULMONARY A: Acute on chronic hypoxemic / hypercapneic respiratory failure  > improving On O2 at home, prior labs suggest chronic CO2 retention Healthcare associated pneumonia  P:   Change to full vent support protocol > wean PEEP/FiO2 Decrease RR Daily chest x-ray Wake up assessment when vent support appropriate Diurese again today  CARDIOVASCULAR A:  Hx atrial flutter Shock: Primarily due to medications at this point > resolved Decompensated diastolic heart failure P:  Telemetry monitoring KVO IV fluids Lasix today   RENAL A:   Acute kidney injury (Cr. 2.42 from 1.14 in 04/2016)  > improving P:   Monitor BMET and UOP Replace electrolytes as needed Check Mg  GASTROINTESTINAL A:   Acute liver injury - shock liver, improving P:   Continue tube feedings Famotidine per tube for GI ppx Trend liver function  HEMATOLOGIC A:   No acute issues Chronic Xarelto P:  Start heparin after PICC line  INFECTIOUS A:   Sepsis secondary to healthcare associated  pneumonia P:   Continue Zosyn 7 days Stop vanc  ENDOCRINE A:   DM2, uncontrolled P:   Continue subcutaneous insulin with long-acting Levemir 55q12h and sliding scale  NEUROLOGIC A:   Altered mental status on presentation - likely secondary to hypoxia and hypercapnea P:   RASS goal: -2 Start fentanyl drip and as needed versed   FAMILY  - Updates: no family available at the time of my interview.   - Inter-disciplinary family meet or Palliative Care meeting due by:  day 7  CC time 8336  Heber CarolinaBrent Florinda Taflinger, MD [facility] Pager: [phone] Cell: [phone] After 3pm or if no response, call [phone]   06/21/2016, 8:31 AM

## 2016-06-21 NOTE — Progress Notes (Signed)
Pharmacy Antibiotic Note  [name] J Glandon is a 45 y.o. male admitted on May 27, 2016 with pneumonia.  Pharmacy has been consulted for vancomycin and Zosyn dosing. Today is day 4 of abx. Cultures negative to date, WBC trending down to 12.9, Tmax 100.9, CrCl > 100 ml/min  Plan: -D/c vancomycin -Continue Zosyn 3.375g IV q8hr until 9/25 -Monitor renal function, c/s, clinical progress  Height: 6\' 3"  (190.5 cm) Weight: (!) 563 lb (255.4 kg) IBW/kg (Calculated) : 84.5  Temp (24hrs), Avg:99.9 F (37.7 C), Min:98.7 F (37.1 C), Max:100.9 F (38.3 C)   Recent Labs Lab 2015-10-07 1840 2015-10-07 1856 2015-10-07 2202 06/19/16 0404 06/19/16 1611 06/20/16 0327 06/21/16 0450 06/21/16 0700  WBC 18.9*  --   --  17.9*  --  14.3* 12.9*  --   CREATININE 2.42*  --   --  2.53* 2.22* 1.64* 1.37*  --   LATICACIDVEN  --  6.99* 3.61*  --   --   --   --   --   VANCOTROUGH  --   --   --   --   --   --   --  14*    Estimated Creatinine Clearance: 147.3 mL/min (by C-G formula based on SCr of 1.37 mg/dL (H)).    Allergies  Allergen Reactions  . Iodine Anaphylaxis and Other (See Comments)    Associated with the shellfish allergy  . Shellfish Allergy Anaphylaxis    Antimicrobials this admission: 9/17 vanc >>9/20 9/17 zosyn >>  9/20 VT 14  Microbiology results: 9/17 BCx: ngtd 9/17 UCx: <10K insignificant growth 9/17 MRSA PCR: neg 9/18 TA: pending. rare GPC in pairs  Thank you for allowing pharmacy to be a part of this patient's care.  Mackie Paienee Jah Alarid, PharmD PGY1 Pharmacy Resident Pager: [phone] 06/21/2016 10:00 AM

## 2016-06-21 NOTE — Progress Notes (Signed)
ANTICOAGULATION CONSULT NOTE Pharmacy Consult for heparin Indication: atrial flutter  Allergies  Allergen Reactions  . Iodine Anaphylaxis and Other (See Comments)    Associated with the shellfish allergy  . Shellfish Allergy Anaphylaxis    Patient Measurements: Height: 6\' 3"  (190.5 cm) Weight: (!) 581 lb (263.5 kg) IBW/kg (Calculated) : 84.5 Heparin Dosing Weight: 152.8 kg  Vital Signs: Temp: 99.8 F (37.7 C) (09/19 2303) Temp Source: Oral (09/19 2303) BP: 105/66 (09/20 0100) Pulse Rate: 124 (09/20 0100)  Labs:  Recent Labs  12/03/2015 1840 06/19/16 0404 06/19/16 0922 06/19/16 1611  06/20/16 0327 06/20/16 0745 06/20/16 1657 06/21/16 0054  HGB 14.3 13.0  --   --   --  11.5*  --   --   --   HCT 43.2 40.3  --   --   --  35.3*  --   --   --   PLT 217 195  --   --   --  179  --   --   --   APTT  --   --  41*  --   --   --   --   --   --   LABPROT 17.4*  --   --   --   --   --   --   --   --   INR 1.41  --   --   --   --   --   --   --   --   HEPARINUNFRC  --   --  <0.10*  --   < >  --  0.26* 0.34 0.41  CREATININE 2.42* 2.53*  --  2.22*  --  1.64*  --   --   --   < > = values in this interval not displayed.  Estimated Creatinine Clearance: 125.6 mL/min (by C-G formula based on SCr of 1.64 mg/dL (H)).  Assessment: 45 y.o. male with h/o PE/Afib, Xarelto on hold, for heparin  Goal of Therapy:  Heparin level 0.3-0.7 units/ml Monitor platelets by anticoagulation protocol: Yes   Plan:  Continue Heparin at current rate  Follow-up am labs.   Geannie RisenGreg Emory Gallentine, PharmD, BCPS  06/21/2016 1:39 AM

## 2016-06-21 NOTE — Progress Notes (Signed)
RT found ETT 25 at the lip. Patient has good return volumes and breath sounds. However, he is slightly diminished on the left side. RT will continue to monitor.

## 2016-06-21 NOTE — Progress Notes (Signed)
ANTICOAGULATION CONSULT NOTE  Pharmacy Consult for heparin Indication: atrial flutter, h/o PE  Allergies  Allergen Reactions  . Iodine Anaphylaxis and Other (See Comments)    Associated with the shellfish allergy  . Shellfish Allergy Anaphylaxis    Patient Measurements: Height: 6\' 3"  (190.5 cm) Weight: (!) 563 lb (255.4 kg) IBW/kg (Calculated) : 84.5 Heparin Dosing Weight: 152.8 kg  Vital Signs: Temp: 99.5 F (37.5 C) (09/20 0711) Temp Source: Oral (09/20 0711) BP: 119/66 (09/20 0900) Pulse Rate: 111 (09/20 0900)  Labs:  Recent Labs  11-06-15 1840 06/19/16 0404 06/19/16 0922 06/19/16 1611  06/20/16 0327  06/20/16 1657 06/21/16 0054 06/21/16 0450  HGB 14.3 13.0  --   --   --  11.5*  --   --   --  10.7*  HCT 43.2 40.3  --   --   --  35.3*  --   --   --  33.6*  PLT 217 195  --   --   --  179  --   --   --  167  APTT  --   --  41*  --   --   --   --   --   --   --   LABPROT 17.4*  --   --   --   --   --   --   --   --   --   INR 1.41  --   --   --   --   --   --   --   --   --   HEPARINUNFRC  --   --  <0.10*  --   < >  --   < > 0.34 0.41 0.44  CREATININE 2.42* 2.53*  --  2.22*  --  1.64*  --   --   --  1.37*  < > = values in this interval not displayed.  Estimated Creatinine Clearance: 147.3 mL/min (by C-G formula based on SCr of 1.37 mg/dL (H)).   Medical History: Past Medical History:  Diagnosis Date  . Abscess    right thigh  . Asthma   . Atrial fibrillation (HCC)   . Atrial flutter (HCC)   . Cellulitis of thigh 10/12/2011  . CHF (congestive heart failure) (HCC)    EF55-60%  . Chronic low back pain   . Chronic respiratory failure (HCC)   . CKD (chronic kidney disease), stage II   . Depression   . DM2 (diabetes mellitus, type 2) (HCC)   . HTN (hypertension)   . Morbid obesity (HCC)   . Obesity hypoventilation syndrome (HCC)   . Patellar tendon rupture 12/18/2014  . Ptosis   . Sleep apnea   . Vision abnormalities     Assessment: 44yo M admitted  on 02/21/16 who was on Xarelto PTA for atrial flutter/hx of PE to continue on heparin gtt. CBC stable, no bleeding documented. HL today therapeutic at 0.44   Goal of Therapy:  Heparin level 0.3-0.7 units/ml Monitor platelets by anticoagulation protocol: Yes   Plan:  Continue heparin infusion at 2800 units/hr Check HL daily Monitor CBC, s/sx of bleeding  Mackie Paienee Ackley, PharmD PGY1 Pharmacy Resident Pager: [phone] 06/21/2016 9:50 AM

## 2016-06-22 ENCOUNTER — Inpatient Hospital Stay (HOSPITAL_COMMUNITY): Payer: Medicare Other

## 2016-06-22 LAB — GLUCOSE, CAPILLARY
GLUCOSE-CAPILLARY: 139 mg/dL — AB (ref 65–99)
GLUCOSE-CAPILLARY: 164 mg/dL — AB (ref 65–99)
GLUCOSE-CAPILLARY: 159 mg/dL — AB (ref 65–99)
Glucose-Capillary: 140 mg/dL — ABNORMAL HIGH (ref 65–99)
Glucose-Capillary: 150 mg/dL — ABNORMAL HIGH (ref 65–99)
Glucose-Capillary: 164 mg/dL — ABNORMAL HIGH (ref 65–99)
Glucose-Capillary: 149 mg/dL — ABNORMAL HIGH (ref 65–99)

## 2016-06-22 LAB — CBC WITH DIFFERENTIAL/PLATELET
BASOS PCT: 0 %
Basophils Absolute: 0 10*3/uL (ref 0.0–0.1)
EOS ABS: 0.2 10*3/uL (ref 0.0–0.7)
EOS PCT: 1 %
HEMATOCRIT: 34.8 % — AB (ref 39.0–52.0)
Hemoglobin: 10.8 g/dL — ABNORMAL LOW (ref 13.0–17.0)
Lymphocytes Relative: 20 %
Lymphs Abs: 2.5 10*3/uL (ref 0.7–4.0)
MCH: 27.1 pg (ref 26.0–34.0)
MCHC: 31 g/dL (ref 30.0–36.0)
MCV: 87.2 fL (ref 78.0–100.0)
MONO ABS: 1.1 10*3/uL — AB (ref 0.1–1.0)
MONOS PCT: 9 %
Neutro Abs: 8.6 10*3/uL — ABNORMAL HIGH (ref 1.7–7.7)
Neutrophils Relative %: 70 %
PLATELETS: 215 10*3/uL (ref 150–400)
RBC: 3.99 MIL/uL — ABNORMAL LOW (ref 4.22–5.81)
RDW: 16.5 % — AB (ref 11.5–15.5)
WBC: 12.4 10*3/uL — AB (ref 4.0–10.5)

## 2016-06-22 LAB — BASIC METABOLIC PANEL
ANION GAP: 7 (ref 5–15)
BUN: 21 mg/dL — AB (ref 6–20)
CALCIUM: 8.3 mg/dL — AB (ref 8.9–10.3)
CO2: 31 mmol/L (ref 22–32)
Chloride: 112 mmol/L — ABNORMAL HIGH (ref 101–111)
Creatinine, Ser: 1.42 mg/dL — ABNORMAL HIGH (ref 0.61–1.24)
GFR calc Af Amer: >60 mL/min (ref >60)
GFR, EST NON AFRICAN AMERICAN: 58 mL/min — AB (ref >60)
GLUCOSE: 148 mg/dL — AB (ref 65–99)
POTASSIUM: 4.1 mmol/L (ref 3.5–5.1)
SODIUM: 150 mmol/L — AB (ref 135–145)

## 2016-06-22 LAB — HEPARIN LEVEL (UNFRACTIONATED): HEPARIN UNFRACTIONATED: 0.41 [IU]/mL (ref 0.30–0.70)

## 2016-06-22 LAB — MAGNESIUM: MAGNESIUM: 2 mg/dL (ref 1.7–2.4)

## 2016-06-22 MED ORDER — ACETAMINOPHEN 160 MG/5ML PO SOLN
650.0000 mg | Freq: Four times a day (QID) | ORAL | Status: DC | PRN
  Administered 2016-06-22 – 2016-06-27 (×7): 650 mg
  Filled 2016-06-22 (×7): qty 20.3

## 2016-06-22 MED ORDER — FREE WATER
100.0000 mL | Freq: Three times a day (TID) | Status: DC
Start: 2016-06-22 — End: 2016-06-23
  Administered 2016-06-22 – 2016-06-23 (×4): 100 mL

## 2016-06-22 MED ORDER — FUROSEMIDE 10 MG/ML IJ SOLN
40.0000 mg | Freq: Four times a day (QID) | INTRAMUSCULAR | Status: AC
  Administered 2016-06-22 (×2): 40 mg via INTRAVENOUS
  Filled 2016-06-22 (×3): qty 4

## 2016-06-22 NOTE — Progress Notes (Signed)
PULMONARY / CRITICAL CARE MEDICINE   Name: [name] MRN: [id number] DOB: 06-13-1971    ADMISSION DATE:  2016/07/17 CONSULTATION DATE: 2016-07-17  REFERRING MD:  EDP  CHIEF COMPLAINT:  Shortness of breath  Brief: 45 year old male with a past medical history significant for diastolic heart failure with recurrent admissions to the intensive care unit over the last 1-2 years for respiratory failure was admitted on July 17, 2016 with acute respiratory fire with hypoxemia and septic shock secondary to healthcare associated pneumonia.   SUBJECTIVE:  Oxygenation improving > PEEP to 8 and FiO2 to 40 Blood glucose in good range this AM Complains of thirst  VITAL SIGNS: BP 111/66   Pulse 97   Temp 100.2 F (37.9 C) (Oral)   Resp (!) 38   Ht 6\' 3"  (1.905 m)   Wt (!) 254.9 kg (562 lb)   SpO2 94%   BMI 70.25 kg/m   HEMODYNAMICS:    VENTILATOR SETTINGS: Vent Mode: PRVC FiO2 (%):  [50 %] 50 % Set Rate:  [28 bmp] 28 bmp Vt Set:  [500 mL] 500 mL PEEP:  [10 cmH20] 10 cmH20 Plateau Pressure:  [26 cmH20-28 cmH20] 28 cmH20  INTAKE / OUTPUT: I/O last 3 completed shifts: In: 5235.7 [I.V.:2785.7; NG/GT:1800; IV Piggyback:650] Out: 5250 [Urine:5250]  PHYSICAL EXAMINATION: General: Morbidly obese, comfortable on vent HEENT: Endotracheal tube in place Pulm: Distant vent supported breath sounds Pulmonary rhonchi bilaterally with  Cardiovascular: tachycardic, regular rhythm GI: belly soft, nontender nondistended Dermatologic: no rash or skin breakdown, edema noted bilaterally Neurologic: sedated on vent, arouses to voice. Follows commands.   LABS:  BMET  Recent Labs Lab 06/20/16 0327 06/21/16 0450 06/22/16 0429  NA 144 144 150*  K 3.6 3.7 4.1  CL 109 113* 112*  CO2 28 27 31   BUN 27* 21* 21*  CREATININE 1.64* 1.37* 1.42*  GLUCOSE 202* 178* 148*    Electrolytes  Recent Labs Lab 06/19/16 1611 06/20/16 0327 06/20/16 1656 06/21/16 0450 06/22/16 0429  CALCIUM 7.7* 8.1*   --  7.4* 8.3*  MG 1.9 1.8 1.8  --  2.0  PHOS 3.3 2.0* 2.9  --   --     CBC  Recent Labs Lab 06/20/16 0327 06/21/16 0450 06/22/16 0429  WBC 14.3* 12.9* 12.4*  HGB 11.5* 10.7* 10.8*  HCT 35.3* 33.6* 34.8*  PLT 179 167 215    Coag's  Recent Labs Lab 17-Jul-2016 1840 06/19/16 0922  APTT  --  41*  INR 1.41  --     Sepsis Markers  Recent Labs Lab 17-Jul-2016 1856 2016-07-17 2202  LATICACIDVEN 6.99* 3.61*    ABG  Recent Labs Lab 2016-07-17 2334 06/19/16 0918 06/20/16 1100  PHART 7.206* 7.407 7.401  PCO2ART 55.5* 40.4 44.6  PO2ART 61.0* 77.0* 85.7    Liver Enzymes  Recent Labs Lab 06/19/16 0404 06/20/16 0327 06/21/16 0836  AST 227* 271* 278*  ALT 134* 129* 139*  ALKPHOS 99 81 66  BILITOT 1.6* 1.3* 1.0  ALBUMIN 2.8* 2.6* 2.2*    Cardiac Enzymes No results for input(s): TROPONINI, PROBNP in the last 168 hours.  Glucose  Recent Labs Lab 06/21/16 1634 06/21/16 1921 06/21/16 2316 06/22/16 0322 06/22/16 0846 06/22/16 0958  GLUCAP 166* 155* 145* 139* 140* 150*    Imaging Dg Chest Port 1 View  Result Date: 06/22/2016 CLINICAL DATA:  ARDS EXAM: PORTABLE CHEST 1 VIEW COMPARISON:  06/21/2016 FINDINGS: Endotracheal tube and nasogastric catheter are again identified and stable. The lungs are hypoinflated bilaterally. Patchy changes are again  identified through both lungs right greater than left. This is is somewhat accentuated by the technical aspects of the film. No new focal abnormality is noted. IMPRESSION: Bilateral infiltrative changes right greater than left. Given some technical variations in the film the overall appearance is stable. Electronically Signed   By: Alcide CleverMark  Lukens M.D.   On: 06/22/2016 08:26    STUDIES:  CXR as above  CULTURES: Blood  9/17 >> Respiratory September 18>>    ANTIBIOTICS: Vancomycin 9/17 >> 9/20 Zosyn 9/17 >> 7 days  SIGNIFICANT EVENTS:   LINES/TUBES: ETT 9/17 >> OGT 9/17 >> PIV PICC 9/18 >    DISCUSSION: 45 year old male with morbid obesity here with septic shock secondary to healthcare acquired pneumonia leading to acute respiratory failure with hypoxemia requiring full ventilatory support. Also has a history of diastolic heart failure.  Improving. Initially required ARDS protocol, but has been improving with ABX and diuresis. Vent needs significantly less compared with a few days ago. Hopeful he can start weaning next 24 hours, doubt he will be able to extubate in that period.   ASSESSMENT / PLAN:  PULMONARY A: Acute on chronic hypoxemic / hypercapneic respiratory failure  > improving On O2 at home, prior labs suggest chronic CO2 retention Healthcare associated pneumonia  P:   Change to full vent support protocol > adjusted settings this AM (40%, 5 PEEP, keep RR 28) Daily chest x-ray Continue to diurese despite small rise in SCr today Hopefull can attempt SBT today  CARDIOVASCULAR A:  Hx Atrial flutter Shock: Primarily due to medications at this point > resolved Decompensated diastolic heart failure  P:  Telemetry monitoring KVO IV fluids Lasix today  RENAL A:   Acute kidney injury (Cr. 2.42 from 1.14 in 04/2016)  > improving Hypernatremia, hyperchloremia  P:   Monitor BMET and UOP Replace electrolytes as needed Free water 100mL TID  GASTROINTESTINAL A:   Acute liver injury - shock liver, improved, but now plateau with mild elevation P:   Continue tube feedings (running at 2150mL/Hr due to high NGT output) Famotidine per tube for GI ppx Trend liver function  HEMATOLOGIC A:   No acute issues Chronic Xarelto P:  Continue heparin infusion  INFECTIOUS A:   Sepsis secondary to healthcare associated pneumonia P:   Continue Zosyn 7 days Vancomycin off   ENDOCRINE A:   DM2, uncontrolled P:   Continue subcutaneous insulin with long-acting Levemir 55q12h and sliding scale  NEUROLOGIC A:   Altered mental status on presentation - likely secondary  to hypoxia and hypercapnea P:   RASS goal: 0 to -1 Start fentanyl drip and as needed versed   FAMILY  - Updates: Patient awake, updated  - Inter-disciplinary family meet or Palliative Care meeting due by:  9/24  Joneen RoachPaul Yonas Bunda, AGACNP-BC [facility] Pager [phone] or [phone]  06/22/2016 10:42 AM

## 2016-06-22 NOTE — Progress Notes (Signed)
ANTICOAGULATION CONSULT NOTE  Pharmacy Consult for heparin Indication: atrial flutter, h/o PE  Allergies  Allergen Reactions  . Iodine Anaphylaxis and Other (See Comments)    Associated with the shellfish allergy  . Shellfish Allergy Anaphylaxis    Patient Measurements: Height: 6\' 3"  (190.5 cm) Weight: (!) 562 lb (254.9 kg) IBW/kg (Calculated) : 84.5 Heparin Dosing Weight: 152.8 kg  Vital Signs: Temp: 99.8 F (37.7 C) (09/21 0320) Temp Source: Oral (09/21 0320) BP: 111/66 (09/21 0700) Pulse Rate: 97 (09/21 0700)  Labs:  Recent Labs  06/19/16 [id number]  06/20/16 0327  06/21/16 0054 06/21/16 0450 06/22/16 0429 06/22/16 0430  HGB  --   < > 11.5*  --   --  10.7* 10.8*  --   HCT  --   --  35.3*  --   --  33.6* 34.8*  --   PLT  --   --  179  --   --  167 215  --   APTT 41*  --   --   --   --   --   --   --   HEPARINUNFRC <0.10*  < >  --   < > 0.41 0.44  --  0.41  CREATININE  --   < > 1.64*  --   --  1.37* 1.42*  --   < > = values in this interval not displayed.  Estimated Creatinine Clearance: 141.9 mL/min (by C-G formula based on SCr of 1.42 mg/dL (H)).   Medical History: Past Medical History:  Diagnosis Date  . Abscess    right thigh  . Asthma   . Atrial fibrillation (HCC)   . Atrial flutter (HCC)   . Cellulitis of thigh 10/12/2011  . CHF (congestive heart failure) (HCC)    EF55-60%  . Chronic low back pain   . Chronic respiratory failure (HCC)   . CKD (chronic kidney disease), stage II   . Depression   . DM2 (diabetes mellitus, type 2) (HCC)   . HTN (hypertension)   . Morbid obesity (HCC)   . Obesity hypoventilation syndrome (HCC)   . Patellar tendon rupture 12/18/2014  . Ptosis   . Sleep apnea   . Vision abnormalities     Assessment: 44yo M admitted on 03/20/16 who was on Xarelto PTA for atrial flutter/hx of PE to continue on heparin gtt. CBC stable, no bleeding documented. HL today therapeutic at 0.41  Goal of Therapy:  Heparin level 0.3-0.7  units/ml Monitor platelets by anticoagulation protocol: Yes   Plan:  Continue heparin infusion at 2800 units/hr Check HL daily Monitor CBC, s/sx of bleeding  Mackie Paienee Ackley, PharmD PGY1 Pharmacy Resident Pager: [phone] 06/22/2016 8:20 AM

## 2016-06-23 LAB — GLUCOSE, CAPILLARY
GLUCOSE-CAPILLARY: 154 mg/dL — AB (ref 65–99)
GLUCOSE-CAPILLARY: 192 mg/dL — AB (ref 65–99)
GLUCOSE-CAPILLARY: 169 mg/dL — AB (ref 65–99)
Glucose-Capillary: 149 mg/dL — ABNORMAL HIGH (ref 65–99)
Glucose-Capillary: 175 mg/dL — ABNORMAL HIGH (ref 65–99)
Glucose-Capillary: 160 mg/dL — ABNORMAL HIGH (ref 65–99)

## 2016-06-23 LAB — CBC
HCT: 35.2 % — ABNORMAL LOW (ref 39.0–52.0)
Hemoglobin: 10.9 g/dL — ABNORMAL LOW (ref 13.0–17.0)
MCH: 27.2 pg (ref 26.0–34.0)
MCHC: 31 g/dL (ref 30.0–36.0)
MCV: 87.8 fL (ref 78.0–100.0)
PLATELETS: 233 10*3/uL (ref 150–400)
RBC: 4.01 MIL/uL — ABNORMAL LOW (ref 4.22–5.81)
RDW: 16.2 % — AB (ref 11.5–15.5)
WBC: 9.8 10*3/uL (ref 4.0–10.5)

## 2016-06-23 LAB — CULTURE, BLOOD (ROUTINE X 2): Culture: NO GROWTH

## 2016-06-23 LAB — BASIC METABOLIC PANEL WITH GFR
Anion gap: 7 (ref 5–15)
Anion gap: 7 (ref 5–15)
BUN: 26 mg/dL — ABNORMAL HIGH (ref 6–20)
BUN: 26 mg/dL — ABNORMAL HIGH (ref 6–20)
CO2: 32 mmol/L (ref 22–32)
CO2: 34 mmol/L — ABNORMAL HIGH (ref 22–32)
Calcium: 8.5 mg/dL — ABNORMAL LOW (ref 8.9–10.3)
Calcium: 8.8 mg/dL — ABNORMAL LOW (ref 8.9–10.3)
Chloride: 114 mmol/L — ABNORMAL HIGH (ref 101–111)
Chloride: 112 mmol/L — ABNORMAL HIGH (ref 101–111)
Creatinine, Ser: 1.56 mg/dL — ABNORMAL HIGH (ref 0.61–1.24)
Creatinine, Ser: 1.63 mg/dL — ABNORMAL HIGH (ref 0.61–1.24)
GFR calc Af Amer: >60 mL/min (ref >60)
GFR calc Af Amer: 57 mL/min — ABNORMAL LOW (ref >60)
GFR calc non Af Amer: 52 mL/min — ABNORMAL LOW (ref >60)
GFR calc non Af Amer: 49 mL/min — ABNORMAL LOW (ref >60)
Glucose, Bld: 182 mg/dL — ABNORMAL HIGH (ref 65–99)
Glucose, Bld: 181 mg/dL — ABNORMAL HIGH (ref 65–99)
Potassium: 4.1 mmol/L (ref 3.5–5.1)
Potassium: 4.2 mmol/L (ref 3.5–5.1)
Sodium: 153 mmol/L — ABNORMAL HIGH (ref 135–145)
Sodium: 153 mmol/L — ABNORMAL HIGH (ref 135–145)

## 2016-06-23 LAB — HEPARIN LEVEL (UNFRACTIONATED): HEPARIN UNFRACTIONATED: 0.44 [IU]/mL (ref 0.30–0.70)

## 2016-06-23 MED ORDER — FUROSEMIDE 10 MG/ML IJ SOLN
40.0000 mg | Freq: Four times a day (QID) | INTRAMUSCULAR | Status: AC
  Administered 2016-06-23 (×2): 40 mg via INTRAVENOUS
  Filled 2016-06-23 (×2): qty 4

## 2016-06-23 MED ORDER — WHITE PETROLATUM GEL
Status: AC
  Administered 2016-06-23: 16:00:00
  Filled 2016-06-23: qty 1

## 2016-06-23 MED ORDER — METOPROLOL TARTRATE 25 MG/10 ML ORAL SUSPENSION
25.0000 mg | Freq: Two times a day (BID) | ORAL | Status: DC
  Administered 2016-06-23 – 2016-06-24 (×4): 25 mg
  Filled 2016-06-23 (×7): qty 10

## 2016-06-23 MED ORDER — METOPROLOL TARTRATE 5 MG/5ML IV SOLN
INTRAVENOUS | Status: AC
  Filled 2016-06-23: qty 5

## 2016-06-23 MED ORDER — PRO-STAT SUGAR FREE PO LIQD
60.0000 mL | Freq: Four times a day (QID) | ORAL | Status: DC
  Administered 2016-06-23 – 2016-06-27 (×14): 60 mL
  Filled 2016-06-23 (×18): qty 60

## 2016-06-23 MED ORDER — FREE WATER
200.0000 mL | Status: DC
  Administered 2016-06-23 – 2016-06-24 (×5): 200 mL

## 2016-06-23 MED ORDER — METOPROLOL TARTRATE 5 MG/5ML IV SOLN
2.5000 mg | INTRAVENOUS | Status: DC | PRN
  Administered 2016-06-23 (×3): 5 mg via INTRAVENOUS
  Administered 2016-06-26: 2.5 mg via INTRAVENOUS
  Filled 2016-06-23 (×3): qty 5

## 2016-06-23 MED ORDER — METOPROLOL TARTRATE 25 MG PO TABS
25.0000 mg | ORAL_TABLET | Freq: Two times a day (BID) | ORAL | Status: DC
  Filled 2016-06-23: qty 1

## 2016-06-23 NOTE — Progress Notes (Signed)
ANTICOAGULATION CONSULT NOTE  Pharmacy Consult for heparin Indication: atrial flutter, h/o PE  Allergies  Allergen Reactions  . Iodine Anaphylaxis and Other (See Comments)    Associated with the shellfish allergy  . Shellfish Allergy Anaphylaxis    Patient Measurements: Height: 6\' 3"  (190.5 cm) Weight: (!) 563 lb (255.4 kg) IBW/kg (Calculated) : 84.5 Heparin Dosing Weight: 152.8 kg  Vital Signs: Temp: 100 F (37.8 C) (09/22 0333) Temp Source: Oral (09/22 0333) BP: 110/66 (09/22 0500) Pulse Rate: 93 (09/22 0500)  Labs:  Recent Labs  06/21/16 0450 06/22/16 0429 06/22/16 0430 06/23/16 0500  HGB 10.7* 10.8*  --  10.9*  HCT 33.6* 34.8*  --  35.2*  PLT 167 215  --  233  HEPARINUNFRC 0.44  --  0.41 0.44  CREATININE 1.37* 1.42*  --   --     Estimated Creatinine Clearance: 142.1 mL/min (by C-G formula based on SCr of 1.42 mg/dL (H)).   Medical History: Past Medical History:  Diagnosis Date  . Abscess    right thigh  . Asthma   . Atrial fibrillation (HCC)   . Atrial flutter (HCC)   . Cellulitis of thigh 10/12/2011  . CHF (congestive heart failure) (HCC)    EF55-60%  . Chronic low back pain   . Chronic respiratory failure (HCC)   . CKD (chronic kidney disease), stage II   . Depression   . DM2 (diabetes mellitus, type 2) (HCC)   . HTN (hypertension)   . Morbid obesity (HCC)   . Obesity hypoventilation syndrome (HCC)   . Patellar tendon rupture 12/18/2014  . Ptosis   . Sleep apnea   . Vision abnormalities     Assessment: 44yo M admitted on 06-23-16 who was on Xarelto PTA for atrial flutter/hx of PE to continue on heparin gtt. CBC stable, no bleeding documented. HL today therapeutic at 0.44   Goal of Therapy:  Heparin level 0.3-0.7 units/ml Monitor platelets by anticoagulation protocol: Yes   Plan:  Continue heparin infusion at 2800 units/hr Check HL daily Monitor CBC, s/sx of bleeding  Mackie Paienee Ackley, PharmD PGY1 Pharmacy Resident Pager:  [phone] 06/23/2016 6:01 AM

## 2016-06-23 NOTE — Progress Notes (Signed)
Nutrition Follow-up  DOCUMENTATION CODES:   Morbid obesity  INTERVENTION:    Continue Vital High Protein at 50 ml/h  Add Prostat 60 ml QID to meet protein needs.  Total intake = 2000 kcal (67% of estimated needs), 225 gm protein (100% of estimated needs), 1003 ml free water daily  NUTRITION DIAGNOSIS:   Inadequate oral intake related to inability to eat as evidenced by NPO status.  Ongoing  GOAL:   Provide needs based on ASPEN/SCCM guidelines  Unmet  MONITOR:   TF tolerance, Vent status, Labs, I & O's  ASSESSMENT:   46M with multiple medical problems who presents in acute on chronic hypoxemic /hypercapneic respiratory failure requiring intubation.  Patient remains intubated on ventilator support Temp (24hrs), Avg:100.6 F (38.1 C), Min:99.9 F (37.7 C), Max:101.6 F (38.7 C)  Discussed patient with RN today. He is tolerating TF well at 50 ml/h. Vital High Protein at 50 ml/h provides 1200 kcal, 105 gm protein, 1003 ml free water daily. Not meeting nutrition needs. TF was decreased to 50 ml/h due to increased NG output on 9/19. Abd xray on 9/19 showed no bowel obstruction, no bowel dilatation.  Receiving 100 ml free water flushes every 8 hours. Labs reviewed: sodium elevated at 150. Medications reviewed and include Lasix, insulin.  Diet Order:  Diet NPO time specified  Skin:  Reviewed, no issues  Last BM:  unknown  Height:   Ht Readings from Last 1 Encounters:  2016-05-21 6\' 3"  (1.905 m)    Weight:   Wt Readings from Last 1 Encounters:  06/23/16 (!) 563 lb (255.4 kg)    Ideal Body Weight:  89.1 kg  BMI:  Body mass index is 70.37 kg/m.  Estimated Nutritional Needs:   Kcal:  2800-3000  Protein:  222 gm  Fluid:  3 L  EDUCATION NEEDS:   No education needs identified at this time  Joaquin CourtsKimberly Isaias Dowson, RD, LDN, CNSC Pager [phone] After Hours Pager [phone]

## 2016-06-23 NOTE — Progress Notes (Signed)
PULMONARY / CRITICAL CARE MEDICINE   Name: [name] MRN: [id number] DOB: 08-Oct-1970    ADMISSION DATE:  July 06, 2016 CONSULTATION DATE: 2016-07-06  REFERRING MD:  EDP  CHIEF COMPLAINT:  Shortness of breath  Brief: 45 year old male with a past medical history significant for diastolic heart failure with recurrent admissions to the intensive care unit over the last 1-2 years for respiratory failure was admitted on 07-06-2016 with acute respiratory fire with hypoxemia and septic shock secondary to healthcare associated pneumonia.   SUBJECTIVE:  Oxygenation worse Fever WBC better  VITAL SIGNS: BP 130/71   Pulse (!) 112   Temp 100.3 F (37.9 C) (Oral)   Resp (!) 28   Ht 6\' 3"  (1.905 m)   Wt (!) 255.4 kg (563 lb)   SpO2 93%   BMI 70.37 kg/m   HEMODYNAMICS:    VENTILATOR SETTINGS: Vent Mode: PRVC FiO2 (%):  [50 %-80 %] 80 % Set Rate:  [27 bmp-28 bmp] 27 bmp Vt Set:  [500 mL] 500 mL PEEP:  [8 cmH20] 8 cmH20 Plateau Pressure:  [21 cmH20-26 cmH20] 26 cmH20  INTAKE / OUTPUT: I/O last 3 completed shifts: In: 5215 [I.V.:2745; NG/GT:2070; IV Piggyback:400] Out: 4975 [Urine:4975]  PHYSICAL EXAMINATION: General: Morbidly obese, comfortable on vent HEENT: Endotracheal tube in place Pulm: rhonchi bilaterally vent supported breath sounds  Cardiovascular: tachycardic, regular rhythm GI: belly soft, nontender nondistended Dermatologic: no rash or skin breakdown, edema noted bilaterally Neurologic: sedated on vent, arouses to voice. Follows commands.   LABS:  BMET  Recent Labs Lab 06/20/16 0327 06/21/16 0450 06/22/16 0429  NA 144 144 150*  K 3.6 3.7 4.1  CL 109 113* 112*  CO2 28 27 31   BUN 27* 21* 21*  CREATININE 1.64* 1.37* 1.42*  GLUCOSE 202* 178* 148*    Electrolytes  Recent Labs Lab 06/19/16 1611 06/20/16 0327 06/20/16 1656 06/21/16 0450 06/22/16 0429  CALCIUM 7.7* 8.1*  --  7.4* 8.3*  MG 1.9 1.8 1.8  --  2.0  PHOS 3.3 2.0* 2.9  --   --      CBC  Recent Labs Lab 06/21/16 0450 06/22/16 0429 06/23/16 0500  WBC 12.9* 12.4* 9.8  HGB 10.7* 10.8* 10.9*  HCT 33.6* 34.8* 35.2*  PLT 167 215 233    Coag's  Recent Labs Lab 07/06/16 1840 06/19/16 0922  APTT  --  41*  INR 1.41  --     Sepsis Markers  Recent Labs Lab 07-06-16 1856 07/06/2016 2202  LATICACIDVEN 6.99* 3.61*    ABG  Recent Labs Lab 07-06-2016 2334 06/19/16 0918 06/20/16 1100  PHART 7.206* 7.407 7.401  PCO2ART 55.5* 40.4 44.6  PO2ART 61.0* 77.0* 85.7    Liver Enzymes  Recent Labs Lab 06/19/16 0404 06/20/16 0327 06/21/16 0836  AST 227* 271* 278*  ALT 134* 129* 139*  ALKPHOS 99 81 66  BILITOT 1.6* 1.3* 1.0  ALBUMIN 2.8* 2.6* 2.2*    Cardiac Enzymes No results for input(s): TROPONINI, PROBNP in the last 168 hours.  Glucose  Recent Labs Lab 06/22/16 1236 06/22/16 1627 06/22/16 1924 06/22/16 2319 06/23/16 0336 06/23/16 0819  GLUCAP 164* 164* 159* 149* 149* 175*    Imaging No results found.  STUDIES:  CXR as above  CULTURES: Blood  9/17 >> Respiratory September 18>>    ANTIBIOTICS: Vancomycin 9/17 >> 9/20 Zosyn 9/17 >> 7 days  SIGNIFICANT EVENTS:   LINES/TUBES: ETT 9/17 >> OGT 9/17 >> PIV PICC 9/18 >   DISCUSSION: 45 year old male with morbid obesity here  with septic shock secondary to healthcare acquired pneumonia leading to acute respiratory failure with hypoxemia requiring full ventilatory support. Also has a history of diastolic heart failure.  Improving. Initially required ARDS protocol, but has been improving with ABX and diuresis. Vent needs significantly less compared with a few days ago. Oxygenation worse 9/22 with fever.  ASSESSMENT / PLAN:  PULMONARY A: Acute on chronic hypoxemic / hypercapneic respiratory failure  > worse On O2 at home, prior labs suggest chronic CO2 retention Healthcare associated pneumonia  P:   Continue full vent support, increase PEEP, change trigger to pressure, try  to get FiO2 < 60% Daily chest x-ray Diurese Hopefull can attempt SBT today  CARDIOVASCULAR A:  Hx Atrial flutter Shock: Primarily due to medications at this point > resolved Decompensated diastolic heart failure  P:  Telemetry monitoring KVO IV fluids Lasix today Scheduled metoprolol  RENAL A:   Acute kidney injury (Cr. 2.42 from 1.14 in 04/2016)  > stable Hypernatremia, hyperchloremia  P:   Monitor BMET and UOP Replace electrolytes as needed Free water 100mL TID  GASTROINTESTINAL A:   Acute liver injury - shock liver, improved, but now plateau with mild elevation P:   Continue tube feedings (running at 6450mL/Hr due to high NGT output) Famotidine per tube for GI ppx Trend liver function  HEMATOLOGIC A:   No acute issues Chronic Xarelto P:  Continue heparin infusion  INFECTIOUS A:   Sepsis secondary to healthcare associated pneumonia Fever 9/22 P:   Continue Zosyn 7 days Vancomycin off  reculture sputum 9/22 for fever  ENDOCRINE A:   DM2, uncontrolled P:   Continue subcutaneous insulin with long-acting Levemir 55q12h and sliding scale  NEUROLOGIC A:   Altered mental status on presentation - likely secondary to hypoxia and hypercapnea P:   RASS goal: 0 to -1 Continue fentanyl drip and as needed versed   FAMILY  - Updates: updated sister yesterday  - Inter-disciplinary family meet or Palliative Care meeting due by:  9/24  My cc time 33 minutes  Heber CarolinaBrent Esabella Stockinger, MD [facility] Pager: [phone] Cell: [phone] After 3pm or if no response, call [phone]   06/23/2016 10:30 AM

## 2016-06-24 ENCOUNTER — Inpatient Hospital Stay (HOSPITAL_COMMUNITY): Payer: Medicare Other

## 2016-06-24 DIAGNOSIS — J810 Acute pulmonary edema: Secondary | ICD-10-CM

## 2016-06-24 LAB — HEPATIC FUNCTION PANEL
ALT: 69 U/L — ABNORMAL HIGH (ref 17–63)
AST: 93 U/L — AB (ref 15–41)
Albumin: 2.1 g/dL — ABNORMAL LOW (ref 3.5–5.0)
Alkaline Phosphatase: 69 U/L (ref 38–126)
BILIRUBIN DIRECT: 0.3 mg/dL (ref 0.1–0.5)
BILIRUBIN INDIRECT: 0.7 mg/dL (ref 0.3–0.9)
BILIRUBIN TOTAL: 1 mg/dL (ref 0.3–1.2)
Total Protein: 6.6 g/dL (ref 6.5–8.1)

## 2016-06-24 LAB — CBC WITH DIFFERENTIAL/PLATELET
BASOS ABS: 0 10*3/uL (ref 0.0–0.1)
Basophils Relative: 0 %
EOS PCT: 2 %
Eosinophils Absolute: 0.3 10*3/uL (ref 0.0–0.7)
HEMATOCRIT: 35.1 % — AB (ref 39.0–52.0)
Hemoglobin: 10.6 g/dL — ABNORMAL LOW (ref 13.0–17.0)
LYMPHS PCT: 20 %
Lymphs Abs: 2.5 10*3/uL (ref 0.7–4.0)
MCH: 27.2 pg (ref 26.0–34.0)
MCHC: 30.2 g/dL (ref 30.0–36.0)
MCV: 90 fL (ref 78.0–100.0)
MONO ABS: 1 10*3/uL (ref 0.1–1.0)
MONOS PCT: 8 %
Neutro Abs: 9 10*3/uL — ABNORMAL HIGH (ref 1.7–7.7)
Neutrophils Relative %: 70 %
PLATELETS: 242 10*3/uL (ref 150–400)
RBC: 3.9 MIL/uL — ABNORMAL LOW (ref 4.22–5.81)
RDW: 16.4 % — AB (ref 11.5–15.5)
WBC: 12.8 10*3/uL — ABNORMAL HIGH (ref 4.0–10.5)

## 2016-06-24 LAB — BASIC METABOLIC PANEL
Anion gap: 9 (ref 5–15)
BUN: 31 mg/dL — AB (ref 6–20)
CO2: 35 mmol/L — AB (ref 22–32)
Calcium: 8.8 mg/dL — ABNORMAL LOW (ref 8.9–10.3)
Chloride: 112 mmol/L — ABNORMAL HIGH (ref 101–111)
Creatinine, Ser: 1.66 mg/dL — ABNORMAL HIGH (ref 0.61–1.24)
GFR calc Af Amer: 56 mL/min — ABNORMAL LOW (ref >60)
GFR, EST NON AFRICAN AMERICAN: 48 mL/min — AB (ref >60)
GLUCOSE: 168 mg/dL — AB (ref 65–99)
POTASSIUM: 4.2 mmol/L (ref 3.5–5.1)
Sodium: 156 mmol/L — ABNORMAL HIGH (ref 135–145)

## 2016-06-24 LAB — GLUCOSE, CAPILLARY
GLUCOSE-CAPILLARY: 126 mg/dL — AB (ref 65–99)
GLUCOSE-CAPILLARY: 147 mg/dL — AB (ref 65–99)
GLUCOSE-CAPILLARY: 155 mg/dL — AB (ref 65–99)
GLUCOSE-CAPILLARY: 163 mg/dL — AB (ref 65–99)
Glucose-Capillary: 148 mg/dL — ABNORMAL HIGH (ref 65–99)

## 2016-06-24 LAB — CULTURE, BLOOD (ROUTINE X 2): CULTURE: NO GROWTH

## 2016-06-24 LAB — HEPARIN LEVEL (UNFRACTIONATED): HEPARIN UNFRACTIONATED: 0.65 [IU]/mL (ref 0.30–0.70)

## 2016-06-24 MED ORDER — FUROSEMIDE 10 MG/ML IJ SOLN
4.0000 mg/h | INTRAVENOUS | Status: DC
  Administered 2016-06-24 – 2016-06-25 (×2): 8 mg/h via INTRAVENOUS
  Filled 2016-06-24 (×3): qty 25

## 2016-06-24 MED ORDER — FUROSEMIDE 10 MG/ML IJ SOLN
60.0000 mg | Freq: Once | INTRAMUSCULAR | Status: AC
  Administered 2016-06-24: 60 mg via INTRAVENOUS
  Filled 2016-06-24: qty 6

## 2016-06-24 MED ORDER — FUROSEMIDE 10 MG/ML IJ SOLN
20.0000 mg | Freq: Once | INTRAMUSCULAR | Status: AC
  Administered 2016-06-24: 20 mg via INTRAVENOUS
  Filled 2016-06-24: qty 2

## 2016-06-24 MED ORDER — FREE WATER
250.0000 mL | Status: DC
  Administered 2016-06-24 – 2016-06-26 (×14): 250 mL

## 2016-06-24 MED ORDER — MIDAZOLAM BOLUS VIA INFUSION
1.0000 mg | INTRAVENOUS | Status: DC | PRN
  Filled 2016-06-24: qty 2

## 2016-06-24 MED ORDER — CHLORHEXIDINE GLUCONATE 0.12 % MT SOLN
OROMUCOSAL | Status: AC
  Filled 2016-06-24: qty 15

## 2016-06-24 MED ORDER — VANCOMYCIN HCL 10 G IV SOLR
1250.0000 mg | Freq: Two times a day (BID) | INTRAVENOUS | Status: DC
  Administered 2016-06-24 – 2016-06-27 (×6): 1250 mg via INTRAVENOUS
  Filled 2016-06-24 (×6): qty 1250

## 2016-06-24 MED ORDER — MIDAZOLAM HCL 5 MG/ML IJ SOLN
0.0000 mg/h | INTRAMUSCULAR | Status: DC
  Administered 2016-06-24 – 2016-06-27 (×8): 5 mg/h via INTRAVENOUS
  Filled 2016-06-24 (×8): qty 10

## 2016-06-24 NOTE — Progress Notes (Signed)
eLink Physician-Brief Progress Note Patient Name: Maurice Marchaul J Kossman DOB: 02/19/1971 MRN: [id number]   Date of Service  06/24/2016  HPI/Events of Note  Request for ARDS protocol orders per Dr. Ulyses JarredMcQuaid's note.  eICU Interventions  Will order ARDS protocol order set.      Intervention Category Major Interventions: Respiratory failure - evaluation and management  [name],[name] 06/24/2016, 9:11 PM

## 2016-06-24 NOTE — Progress Notes (Signed)
Pharmacy Antibiotic Note  [name] is a 45 y.o. male admitted on 06/30/2016 with pneumonia.  Pharmacy has been consulted for vancomycin. Pt continues on zosyn. Vancomycin had been stopped a few days ago but now restarting.   Plan: - Restart previous dose of vancomycin 1250mg  IV Q12H - trough had been slightly low at this dose but Scr is higher than when the last trough was drawn - F/u renal fxn, C&S, clinical status and trough at SS  Height: 6\' 3"  (190.5 cm) Weight: (!) 559 lb (253.6 kg) IBW/kg (Calculated) : 84.5  Temp (24hrs), Avg:100.7 F (38.2 C), Min:100.1 F (37.8 C), Max:101.7 F (38.7 C)   Recent Labs Lab 06/28/2016 1856 06/13/2016 2202  06/20/16 0327 06/21/16 0450 06/21/16 0700 06/22/16 0429 06/23/16 0500 06/23/16 1333 06/23/16 1800 06/24/16 0442  WBC  --   --   < > 14.3* 12.9*  --  12.4* 9.8  --   --  12.8*  CREATININE  --   --   < > 1.64* 1.37*  --  1.42*  --  1.56* 1.63* 1.66*  LATICACIDVEN 6.99* 3.61*  --   --   --   --   --   --   --   --   --   VANCOTROUGH  --   --   --   --   --  14*  --   --   --   --   --   < > = values in this interval not displayed.  Estimated Creatinine Clearance: 120.9 mL/min (by C-G formula based on SCr of 1.66 mg/dL (H)).    Allergies  Allergen Reactions  . Iodine Anaphylaxis and Other (See Comments)    Associated with the shellfish allergy  . Shellfish Allergy Anaphylaxis    Antimicrobials this admission: 9/17 vanc >>9/20; 9/23>> 9/17 zosyn >>  9/20 VT 14  Microbiology results: 9/17 BCx: NEG 9/17 UCx: <10K insignificant growth 9/18 TA: NRF MRSA PCR: neg 9/22 TA - pending  Thank you for allowing pharmacy to be a part of this patient's care.  Lysle Pearlachel Debria Broecker, PharmD, BCPS Pager # [phone] 06/24/2016 3:01 PM

## 2016-06-24 NOTE — Progress Notes (Signed)
ANTICOAGULATION CONSULT NOTE  Pharmacy Consult for heparin Indication: atrial flutter, h/o PE  Allergies  Allergen Reactions  . Iodine Anaphylaxis and Other (See Comments)    Associated with the shellfish allergy  . Shellfish Allergy Anaphylaxis    Patient Measurements: Height: 6\' 3"  (190.5 cm) Weight: (!) 559 lb (253.6 kg) IBW/kg (Calculated) : 84.5 Heparin Dosing Weight: 152.8 kg  Vital Signs: Temp: 100.2 F (37.9 C) (09/23 0802) Temp Source: Axillary (09/23 0802) BP: 114/66 (09/23 1100) Pulse Rate: 109 (09/23 1100)  Labs:  Recent Labs  06/22/16 0429 06/22/16 0430 06/23/16 0500 06/23/16 1333 06/23/16 1800 06/24/16 0442  HGB 10.8*  --  10.9*  --   --  10.6*  HCT 34.8*  --  35.2*  --   --  35.1*  PLT 215  --  233  --   --  242  HEPARINUNFRC  --  0.41 0.44  --   --  0.65  CREATININE 1.42*  --   --  1.56* 1.63* 1.66*    Estimated Creatinine Clearance: 120.9 mL/min (by C-G formula based on SCr of 1.66 mg/dL (H)).  Assessment: 44yo M admitted on 31-Jan-2016 who was on Xarelto PTA for atrial flutter/hx of PE to continue on heparin gtt. CBC stable, no bleeding documented. HL today therapeutic at 0.65 but did increase significantly. Will monitor closely.   Goal of Therapy:  Heparin level 0.3-0.7 units/ml Monitor platelets by anticoagulation protocol: Yes   Plan:  Continue heparin infusion at 2800 units/hr Daily heparin level and CBC  Lysle Pearlachel Merilee Wible, PharmD, BCPS Pager # [phone] 06/24/2016 12:08 PM

## 2016-06-24 NOTE — Progress Notes (Signed)
PULMONARY / CRITICAL CARE MEDICINE   Name: [name] MRN: [id number] DOB: 1971/04/08    ADMISSION DATE:  06/02/2016 CONSULTATION DATE: 06/07/2016  REFERRING MD:  EDP  CHIEF COMPLAINT:  Shortness of breath  Brief: 45 year old male with a past medical history significant for diastolic heart failure with recurrent admissions to the intensive care unit over the last 1-2 years for respiratory failure was admitted on 06/05/2016 with acute respiratory fire with hypoxemia and septic shock secondary to healthcare associated pneumonia.   SUBJECTIVE:  RN reports sats running 85-87%, remains on fentanyl 400 mcg, fever to 101 overnight, worsening O2 needs  VITAL SIGNS: BP 121/63   Pulse (!) 140   Temp 100.2 F (37.9 C) (Axillary)   Resp (!) 28   Ht 6\' 3"  (1.905 m)   Wt (!) 559 lb (253.6 kg)   SpO2 (!) 88%   BMI 69.87 kg/m   HEMODYNAMICS:    VENTILATOR SETTINGS: Vent Mode: PRVC FiO2 (%):  [70 %-100 %] 100 % Set Rate:  [28 bmp] 28 bmp Vt Set:  [500 mL] 500 mL PEEP:  [12 cmH20] 12 cmH20 Plateau Pressure:  [26 cmH20-31 cmH20] 26 cmH20  INTAKE / OUTPUT: I/O last 3 completed shifts: In: 46 [I.V.:2539; OZ/HY:8657; IV Piggyback:350] Out: 5315 [Urine:5315]  PHYSICAL EXAMINATION: General: Morbidly obese young adult male in NAD, comfortable on vent HEENT: Endotracheal tube in place, unable to appreciate JVD due to habitus Pulm: rhonchi bilaterally, non-labored, vent supported breath sounds  Cardiovascular: tachycardic, regular rhythm GI: belly soft, nontender nondistended Dermatologic: no rash or skin breakdown, edema noted bilaterally Neurologic: sedated on vent, arouses to voice. Follows commands.   LABS:  BMET  Recent Labs Lab 06/23/16 1333 06/23/16 1800 06/24/16 0442  NA 153* 153* 156*  K 4.1 4.2 4.2  CL 114* 112* 112*  CO2 32 34* 35*  BUN 26* 26* 31*  CREATININE 1.56* 1.63* 1.66*  GLUCOSE 182* 181* 168*    Electrolytes  Recent Labs Lab 06/19/16 1611  06/20/16 0327 06/20/16 1656  06/22/16 0429 06/23/16 1333 06/23/16 1800 06/24/16 0442  CALCIUM 7.7* 8.1*  --   < > 8.3* 8.5* 8.8* 8.8*  MG 1.9 1.8 1.8  --  2.0  --   --   --   PHOS 3.3 2.0* 2.9  --   --   --   --   --   < > = values in this interval not displayed.  CBC  Recent Labs Lab 06/22/16 0429 06/23/16 0500 06/24/16 0442  WBC 12.4* 9.8 12.8*  HGB 10.8* 10.9* 10.6*  HCT 34.8* 35.2* 35.1*  PLT 215 233 242    Coag's  Recent Labs Lab 06/04/2016 1840 06/19/16 0922  APTT  --  41*  INR 1.41  --     Sepsis Markers  Recent Labs Lab 06/24/2016 1856 06/17/2016 2202  LATICACIDVEN 6.99* 3.61*    ABG  Recent Labs Lab 06/11/2016 2334 06/19/16 0918 06/20/16 1100  PHART 7.206* 7.407 7.401  PCO2ART 55.5* 40.4 44.6  PO2ART 61.0* 77.0* 85.7    Liver Enzymes  Recent Labs Lab 06/20/16 0327 06/21/16 0836 06/24/16 0442  AST 271* 278* 93*  ALT 129* 139* 69*  ALKPHOS 81 66 69  BILITOT 1.3* 1.0 1.0  ALBUMIN 2.6* 2.2* 2.1*    Cardiac Enzymes No results for input(s): TROPONINI, PROBNP in the last 168 hours.  Glucose  Recent Labs Lab 06/23/16 1200 06/23/16 1602 06/23/16 1919 06/23/16 2316 06/24/16 0322 06/24/16 0813  GLUCAP 160* 154* 192* 169* 148*  126*    Imaging Dg Chest Port 1 View  Result Date: 06/24/2016 CLINICAL DATA:  Followup ARDS. EXAM: PORTABLE CHEST 1 VIEW COMPARISON:  06/22/2016. FINDINGS: Tracheostomy tube in satisfactory position. Nasogastric tube extending into the distal esophagus, obscured distally. No significant change in diffuse bilateral airspace opacities. Stable enlarged cardiac silhouette. Thoracic spine degenerative changes. IMPRESSION: Stable cardiomegaly and diffuse bilateral alveolar edema, pneumonia or ARDS. Electronically Signed   By: Beckie SaltsSteven  Reid M.D.   On: 06/24/2016 07:20    STUDIES:  CXR as above  CULTURES: Blood  9/17 >> neg   Sputum 9/18 >> normal flora  BCx2 9/18 >>  Sputum 9/22 >>   ANTIBIOTICS: Vancomycin 9/17  >> 9/20 Zosyn 9/17 >> 7 days  SIGNIFICANT EVENTS:   LINES/TUBES: ETT 9/17 >> OGT 9/17 >> PIV PICC 9/18 >>   DISCUSSION: 45 year old male with morbid obesity here with septic shock secondary to healthcare acquired pneumonia leading to acute respiratory failure with hypoxemia requiring full ventilatory support. Also has a history of diastolic heart failure. Rrequired ARDS protocol,  Initially improving with ABX and diuresis. Vent needs improved, then developed fever 9/22 and worsening oxygenation.   ASSESSMENT / PLAN:  PULMONARY A: Acute on chronic hypoxemic / hypercapneic respiratory failure - worse On O2 at home, prior labs suggest chronic CO2 retention Healthcare associated pneumonia P:   Continue full vent support, increase PEEP to 14, change trigger to pressure, try to get FiO2 < 60% Daily chest x-ray Diuresis as BP / renal function permit, lasix 60 mg IV x1  Increase sedation, see below  CARDIOVASCULAR A:  Hx Atrial flutter Shock - secondary to medications, resolved Decompensated diastolic heart failure P:  Telemetry monitoring KVO IV fluids Lasix 60 mg IV x1 Metoprolol 25 mg BID + PRN   RENAL A:   Acute kidney injury (Cr. 2.42 from 1.14 in 04/2016) - stable Hypernatremia, hyperchloremia P:   Monitor BMET and UOP Replace electrolytes as needed Free water 250 ml Q6  GASTROINTESTINAL A:   Acute liver injury - shock liver, improved, but now plateau with mild elevation P:   Continue tube feedings (running at 7750mL/Hr due to high NGT output) Famotidine per tube for GI ppx Trend liver function  HEMATOLOGIC A:   Chronic Xarelto P:  Continue heparin infusion Trend CBC   INFECTIOUS A:   Sepsis - secondary to healthcare associated pneumonia Fever - 9/22 P:   Continue Zosyn for 7 days Monitor off vanco  Follow cultures as above  ENDOCRINE A:   DM2, uncontrolled P:   Continue subcutaneous insulin with long-acting Levemir 55q12h and sliding  scale  NEUROLOGIC A:   Altered mental status on presentation - likely secondary to hypoxia and hypercapnea P:   RASS goal: -4 Continue fentanyl drip  Versed gtt for deep sedation May need paralytics, hopeful to avoid Mobilize as able, early PT when possible   FAMILY  - Updates: No family available am 9/23.    - Inter-disciplinary family meet or Palliative Care meeting due by:  9/24   Canary BrimBrandi Brazos Sandoval, NP-C [facility] Pgr: [phone] or if no answer [phone] 06/24/2016, 11:20 AM

## 2016-06-25 LAB — POCT I-STAT 3, ART BLOOD GAS (G3+)
ACID-BASE EXCESS: 9 mmol/L — AB (ref 0.0–2.0)
ACID-BASE EXCESS: 10 mmol/L — AB (ref 0.0–2.0)
BICARBONATE: 36.6 mmol/L — AB (ref 20.0–28.0)
Bicarbonate: 35.2 mmol/L — ABNORMAL HIGH (ref 20.0–28.0)
O2 SAT: 92 %
O2 SAT: 98 %
PH ART: 7.368 (ref 7.350–7.450)
PO2 ART: 75 mmHg — AB (ref 83.0–108.0)
PO2 ART: 118 mmHg — AB (ref 83.0–108.0)
Patient temperature: 101.9
TCO2: 37 mmol/L (ref 0–100)
TCO2: 39 mmol/L (ref 0–100)
pCO2 arterial: 62.3 mmHg — ABNORMAL HIGH (ref 32.0–48.0)
pCO2 arterial: 66.8 mmHg (ref 32.0–48.0)
pH, Arterial: 7.354 (ref 7.350–7.450)

## 2016-06-25 LAB — GLUCOSE, CAPILLARY
GLUCOSE-CAPILLARY: 158 mg/dL — AB (ref 65–99)
GLUCOSE-CAPILLARY: 156 mg/dL — AB (ref 65–99)
Glucose-Capillary: 140 mg/dL — ABNORMAL HIGH (ref 65–99)
Glucose-Capillary: 141 mg/dL — ABNORMAL HIGH (ref 65–99)
Glucose-Capillary: 143 mg/dL — ABNORMAL HIGH (ref 65–99)
Glucose-Capillary: 156 mg/dL — ABNORMAL HIGH (ref 65–99)

## 2016-06-25 LAB — CBC
HCT: 35.3 % — ABNORMAL LOW (ref 39.0–52.0)
Hemoglobin: 10.6 g/dL — ABNORMAL LOW (ref 13.0–17.0)
MCH: 27.2 pg (ref 26.0–34.0)
MCHC: 30 g/dL (ref 30.0–36.0)
MCV: 90.7 fL (ref 78.0–100.0)
PLATELETS: 250 10*3/uL (ref 150–400)
RBC: 3.89 MIL/uL — AB (ref 4.22–5.81)
RDW: 16.3 % — ABNORMAL HIGH (ref 11.5–15.5)
WBC: 13.1 10*3/uL — ABNORMAL HIGH (ref 4.0–10.5)

## 2016-06-25 LAB — HEPATIC FUNCTION PANEL
ALT: 51 U/L (ref 17–63)
AST: 65 U/L — ABNORMAL HIGH (ref 15–41)
Albumin: 2.2 g/dL — ABNORMAL LOW (ref 3.5–5.0)
Alkaline Phosphatase: 64 U/L (ref 38–126)
BILIRUBIN DIRECT: 0.2 mg/dL (ref 0.1–0.5)
BILIRUBIN INDIRECT: 0.6 mg/dL (ref 0.3–0.9)
Total Bilirubin: 0.8 mg/dL (ref 0.3–1.2)
Total Protein: 6.8 g/dL (ref 6.5–8.1)

## 2016-06-25 LAB — BASIC METABOLIC PANEL
ANION GAP: 9 (ref 5–15)
BUN: 42 mg/dL — AB (ref 6–20)
CALCIUM: 8.6 mg/dL — AB (ref 8.9–10.3)
CO2: 35 mmol/L — ABNORMAL HIGH (ref 22–32)
CREATININE: 1.79 mg/dL — AB (ref 0.61–1.24)
Chloride: 111 mmol/L (ref 101–111)
GFR calc Af Amer: 51 mL/min — ABNORMAL LOW (ref >60)
GFR, EST NON AFRICAN AMERICAN: 44 mL/min — AB (ref >60)
GLUCOSE: 165 mg/dL — AB (ref 65–99)
Potassium: 3.8 mmol/L (ref 3.5–5.1)
Sodium: 155 mmol/L — ABNORMAL HIGH (ref 135–145)

## 2016-06-25 LAB — HEPARIN LEVEL (UNFRACTIONATED): HEPARIN UNFRACTIONATED: 0.65 [IU]/mL (ref 0.30–0.70)

## 2016-06-25 MED ORDER — METOPROLOL TARTRATE 25 MG/10 ML ORAL SUSPENSION
12.5000 mg | Freq: Two times a day (BID) | ORAL | Status: DC
  Administered 2016-06-25: 12.5 mg
  Filled 2016-06-25 (×2): qty 5

## 2016-06-25 MED ORDER — CHLORHEXIDINE GLUCONATE 0.12 % MT SOLN
OROMUCOSAL | Status: AC
  Filled 2016-06-25: qty 15

## 2016-06-25 MED ORDER — ROCURONIUM BROMIDE 50 MG/5ML IV SOLN
100.0000 mg | Freq: Once | INTRAVENOUS | Status: AC
  Administered 2016-06-25: 100 mg via INTRAVENOUS
  Filled 2016-06-25 (×2): qty 10

## 2016-06-25 MED ORDER — SODIUM CHLORIDE 0.9 % IV SOLN
1.0000 g | Freq: Three times a day (TID) | INTRAVENOUS | Status: DC
  Administered 2016-06-25 – 2016-06-27 (×6): 1 g via INTRAVENOUS
  Filled 2016-06-25 (×8): qty 1

## 2016-06-25 NOTE — Progress Notes (Signed)
PULMONARY / CRITICAL CARE MEDICINE   Name: [name] MRN: [id number] DOB: 04/18/1971    ADMISSION DATE:  06/17/2016 CONSULTATION DATE: 06/16/2016  REFERRING MD:  EDP  CHIEF COMPLAINT:  Shortness of breath  Brief: 45 year old male with a past medical history significant for diastolic heart failure with recurrent admissions to the intensive care unit over the last 1-2 years for respiratory failure was admitted on 06/09/2016 with acute respiratory fire with hypoxemia and septic shock secondary to healthcare associated pneumonia.   SUBJECTIVE:   RN reports pt placed on ARDS protocol overnight, dropped sats / required recruitment maneuvers. PEEP 16, FiO2 100%.  UOP 4.7L in last 24 hours, net neg ~ 600.  Febrile to 101.9.  Weights unchanged per documentation.   VITAL SIGNS: BP (!) 92/55   Pulse 97   Temp (!) 101.9 F (38.8 C) (Oral)   Resp (!) 28   Ht 6\' 3"  (1.905 m)   Wt (!) 561 lb (254.5 kg)   SpO2 90%   BMI 70.12 kg/m   HEMODYNAMICS:    VENTILATOR SETTINGS: Vent Mode: PRVC FiO2 (%):  [100 %] 100 % Set Rate:  [28 bmp] 28 bmp Vt Set:  [500 mL] 500 mL PEEP:  [14 cmH20-16 cmH20] 16 cmH20 Plateau Pressure:  [28 cmH20-34 cmH20] 32 cmH20  INTAKE / OUTPUT: I/O last 3 completed shifts: In: 6148 [I.V.:2692; NG/GT:2640; IV Piggyback:816] Out: 5690 [Urine:5690]  PHYSICAL EXAMINATION: General: Morbidly obese young adult male in NAD, comfortable on vent HEENT: Endotracheal tube in place, unable to appreciate JVD due to habitus Pulm: coarse bilaterally, non-labored, vent supported breath sounds  Cardiovascular: tachycardic, regular rhythm GI: belly soft, nontender nondistended Dermatologic: no rash or skin breakdown, edema noted bilaterally Neurologic: sedated on vent, RASS -3  LABS:  BMET  Recent Labs Lab 06/23/16 1800 06/24/16 0442 06/25/16 0350  NA 153* 156* 155*  K 4.2 4.2 3.8  CL 112* 112* 111  CO2 34* 35* 35*  BUN 26* 31* 42*  CREATININE 1.63* 1.66* 1.79*   GLUCOSE 181* 168* 165*    Electrolytes  Recent Labs Lab 06/19/16 1611 06/20/16 0327 06/20/16 1656  06/22/16 0429  06/23/16 1800 06/24/16 0442 06/25/16 0350  CALCIUM 7.7* 8.1*  --   < > 8.3*  < > 8.8* 8.8* 8.6*  MG 1.9 1.8 1.8  --  2.0  --   --   --   --   PHOS 3.3 2.0* 2.9  --   --   --   --   --   --   < > = values in this interval not displayed.  CBC  Recent Labs Lab 06/23/16 0500 06/24/16 0442 06/25/16 0350  WBC 9.8 12.8* 13.1*  HGB 10.9* 10.6* 10.6*  HCT 35.2* 35.1* 35.3*  PLT 233 242 250    Coag's  Recent Labs Lab 06/19/2016 1840 06/19/16 0922  APTT  --  41*  INR 1.41  --     Sepsis Markers  Recent Labs Lab 06/07/2016 1856 06/19/2016 2202  LATICACIDVEN 6.99* 3.61*    ABG  Recent Labs Lab 06/02/2016 2334 06/19/16 0918 06/20/16 1100  PHART 7.206* 7.407 7.401  PCO2ART 55.5* 40.4 44.6  PO2ART 61.0* 77.0* 85.7    Liver Enzymes  Recent Labs Lab 06/21/16 0836 06/24/16 0442 06/25/16 0350  AST 278* 93* 65*  ALT 139* 69* 51  ALKPHOS 66 69 64  BILITOT 1.0 1.0 0.8  ALBUMIN 2.2* 2.1* 2.2*    Cardiac Enzymes No results for input(s): TROPONINI, PROBNP in  the last 168 hours.  Glucose  Recent Labs Lab 06/24/16 1216 06/24/16 1634 06/24/16 2012 06/25/16 0015 06/25/16 0351 06/25/16 0828  GLUCAP 147* 155* 163* 141* 156* 143*    Imaging No results found.  STUDIES:  CXR as above  CULTURES: Blood  9/17 >> neg   Sputum 9/18 >> normal flora  BCx2 9/18 >> neg Sputum 9/22 >>   ANTIBIOTICS: Vancomycin 9/17 >> 9/20, 9/23 >>  Zosyn 9/17 >> 7 days  SIGNIFICANT EVENTS: 9/17  Admit with septic shock secondary to HCAP  9/21  Spiked fever 9/23  Worsening oxygenation, ARDS protocol  9/24  PEEP 16, FiO2 100%  LINES/TUBES: ETT 9/17 >> OGT 9/17 >> PIV PICC 9/18 >>   DISCUSSION: 45 year old male with morbid obesity here with septic shock secondary to healthcare acquired pneumonia leading to acute respiratory failure with hypoxemia  requiring full ventilatory support. Also has a history of diastolic heart failure. Rrequired ARDS protocol,  Initially improving with ABX and diuresis. Vent needs improved, then developed fever 9/22 and worsening oxygenation.   ASSESSMENT / PLAN:  PULMONARY A: Acute on chronic hypoxemic / hypercapneic respiratory failure - worse On O2 at home, prior labs suggest chronic CO2 retention Healthcare associated pneumonia P:   ARDS protocol, continue current vent settings >500/28/16/100% Wean PEEP / FiO2 for sats > 88% Assess ABG with high PEEP / ARDS protocol  Daily chest x-ray Lasix gtt, 8 mg/hr  CARDIOVASCULAR A:  Hx Atrial flutter Shock - secondary to medications, resolved Decompensated diastolic heart failure P:  Telemetry monitoring KVO IV fluids Diuresis as above Reduce Metoprolol to 12.5 mg BID + PRN with soft pressures / active diuresis   RENAL A:   Acute kidney injury (Cr. 2.42 from 1.14 in 04/2016) - stable Hypernatremia, hyperchloremia P:   Monitor BMET and UOP Replace electrolytes as needed Free water 250 ml Q4  GASTROINTESTINAL A:   Acute liver injury - shock liver, improved, but now plateau with mild elevation P:   Continue tube feedings (running at 52mL/hr due to high NGT output) Famotidine per tube for GI ppx Trend liver function  HEMATOLOGIC A:   Chronic Xarelto P:  Continue heparin infusion Trend CBC   INFECTIOUS A:   Sepsis - secondary to healthcare associated pneumonia Fever - 9/22 P:   Continue Zosyn for 7 days Vanco resumed 9/23  Follow cultures as above  ENDOCRINE A:   DM2, uncontrolled at baseline.  P:   Continue subcutaneous insulin with long-acting Levemir 55q12h and sliding scale  NEUROLOGIC A:   Altered mental status on presentation - likely secondary to hypoxia and hypercapnea P:   RASS goal: -4 Continue fentanyl drip  Versed gtt for deep sedation May need paralytics, hopeful to avoid Mobilize as able, early PT when  possible   FAMILY  - Updates: No family available am 9/24 am.  Sister updated per Dr. Kendrick Fries on 9/23     - Inter-disciplinary family meet or Palliative Care meeting due by:  9/24   Canary Brim, NP-C  [facility] Pgr: [phone] or if no answer [phone] 06/25/2016, 10:19 AM

## 2016-06-25 NOTE — Progress Notes (Signed)
ANTICOAGULATION CONSULT NOTE  Pharmacy Consult for heparin Indication: atrial flutter, h/o PE  Allergies  Allergen Reactions  . Iodine Anaphylaxis and Other (See Comments)    Associated with the shellfish allergy  . Shellfish Allergy Anaphylaxis    Patient Measurements: Height: 6\' 3"  (190.5 cm) Weight: (!) 561 lb (254.5 kg) IBW/kg (Calculated) : 84.5 Heparin Dosing Weight: 152.8 kg  Vital Signs: Temp: 101.9 F (38.8 C) (09/24 0826) Temp Source: Oral (09/24 0826) BP: 92/55 (09/24 0630) Pulse Rate: 97 (09/24 0630)  Labs:  Recent Labs  06/23/16 0500  06/23/16 1800 06/24/16 0442 06/25/16 0350  HGB 10.9*  --   --  10.6* 10.6*  HCT 35.2*  --   --  35.1* 35.3*  PLT 233  --   --  242 250  HEPARINUNFRC 0.44  --   --  0.65 0.65  CREATININE  --   < > 1.63* 1.66* 1.79*  < > = values in this interval not displayed.  Estimated Creatinine Clearance: 112.4 mL/min (by C-G formula based on SCr of 1.79 mg/dL (H)).  Assessment: 44yo M admitted on 06/03/2016 who was on Xarelto PTA for atrial flutter/hx of PE to continue on heparin gtt. CBC stable, no bleeding documented. HL today remains therapeutic at 0.65.   Goal of Therapy:  Heparin level 0.3-0.7 units/ml Monitor platelets by anticoagulation protocol: Yes   Plan:  Continue heparin infusion at 2800 units/hr Daily heparin level and CBC  Lysle Pearlachel Mida Cory, PharmD, BCPS Pager # [phone] 06/25/2016 10:20 AM

## 2016-06-25 NOTE — Progress Notes (Signed)
Pharmacy Antibiotic Note  [name] is a 45 y.o. male admitted on 06/11/2016 with pneumonia.  Pharmacy has been consulted for meropenem. Previously on zosyn but grew enterobacter in trach aspirate. Also continues on vancomycin.    Plan: - Meropenem 1gm IV Q8H - F/u renal fxn, C&S, clinical status   Height: 6\' 3"  (190.5 cm) Weight: (!) 561 lb (254.5 kg) IBW/kg (Calculated) : 84.5  Temp (24hrs), Avg:101.4 F (38.6 C), Min:100.5 F (38.1 C), Max:101.9 F (38.8 C)   Recent Labs Lab 06/24/2016 1856 06/10/2016 2202  06/21/16 0450 06/21/16 0700 06/22/16 0429 06/23/16 0500 06/23/16 1333 06/23/16 1800 06/24/16 0442 06/25/16 0350  WBC  --   --   < > 12.9*  --  12.4* 9.8  --   --  12.8* 13.1*  CREATININE  --   --   < > 1.37*  --  1.42*  --  1.56* 1.63* 1.66* 1.79*  LATICACIDVEN 6.99* 3.61*  --   --   --   --   --   --   --   --   --   VANCOTROUGH  --   --   --   --  14*  --   --   --   --   --   --   < > = values in this interval not displayed.  Estimated Creatinine Clearance: 112.4 mL/min (by C-G formula based on SCr of 1.79 mg/dL (H)).    Allergies  Allergen Reactions  . Iodine Anaphylaxis and Other (See Comments)    Associated with the shellfish allergy  . Shellfish Allergy Anaphylaxis    Antimicrobials this admission: 9/17 vanc >>9/20; 9/23>> 9/17 zosyn >>9/24 9/24 meropenem >>  9/20 VT 14  Microbiology results: 9/17 BCx: NEG 9/17 UCx: <10K insignificant growth 9/18 TA: NRF MRSA PCR: neg 9/22 TA - few enterobacter  Thank you for allowing pharmacy to be a part of this patient's care.  Lysle Pearlachel Amram Maya, PharmD, BCPS Pager # [phone] 06/25/2016 2:25 PM

## 2016-06-25 NOTE — Progress Notes (Signed)
eLink Physician-Brief Progress Note Patient Name: [name] DOB: 06/04/1971 MRN: [id number]   Date of Service  06/25/2016  HPI/Events of Note  Peak pressure and plateau pressure still up.  On 6 cc/kg Vt.     eICU Interventions  Will change to 5 cc/kg Vt, increase RR to maintain minute ventilation.      Intervention Category Major Interventions: Other:  Tifanie Gardiner 06/25/2016, 8:26 PM

## 2016-06-26 ENCOUNTER — Inpatient Hospital Stay (HOSPITAL_COMMUNITY): Payer: Medicare Other

## 2016-06-26 DIAGNOSIS — J189 Pneumonia, unspecified organism: Secondary | ICD-10-CM

## 2016-06-26 DIAGNOSIS — N179 Acute kidney failure, unspecified: Secondary | ICD-10-CM

## 2016-06-26 DIAGNOSIS — J9601 Acute respiratory failure with hypoxia: Secondary | ICD-10-CM

## 2016-06-26 LAB — GLUCOSE, CAPILLARY
GLUCOSE-CAPILLARY: 165 mg/dL — AB (ref 65–99)
GLUCOSE-CAPILLARY: 193 mg/dL — AB (ref 65–99)
Glucose-Capillary: 162 mg/dL — ABNORMAL HIGH (ref 65–99)
Glucose-Capillary: 170 mg/dL — ABNORMAL HIGH (ref 65–99)
Glucose-Capillary: 195 mg/dL — ABNORMAL HIGH (ref 65–99)

## 2016-06-26 LAB — CULTURE, RESPIRATORY

## 2016-06-26 LAB — POCT I-STAT 3, ART BLOOD GAS (G3+)
Acid-Base Excess: 6 mmol/L — ABNORMAL HIGH (ref 0.0–2.0)
Bicarbonate: 35.7 mmol/L — ABNORMAL HIGH (ref 20.0–28.0)
O2 SAT: 81 %
PCO2 ART: 86.6 mmHg — AB (ref 32.0–48.0)
PH ART: 7.223 — AB (ref 7.350–7.450)
Patient temperature: 98.6
TCO2: 38 mmol/L (ref 0–100)
pO2, Arterial: 57 mmHg — ABNORMAL LOW (ref 83.0–108.0)

## 2016-06-26 LAB — CULTURE, RESPIRATORY W GRAM STAIN

## 2016-06-26 LAB — CARBOXYHEMOGLOBIN
CARBOXYHEMOGLOBIN: 1.7 % — AB (ref 0.5–1.5)
METHEMOGLOBIN: 0.6 % (ref 0.0–1.5)
O2 SAT: 86.1 %
TOTAL HEMOGLOBIN: 12.8 g/dL (ref 12.0–16.0)

## 2016-06-26 LAB — CBC
HCT: 37.1 % — ABNORMAL LOW (ref 39.0–52.0)
Hemoglobin: 11.1 g/dL — ABNORMAL LOW (ref 13.0–17.0)
MCH: 27.3 pg (ref 26.0–34.0)
MCHC: 29.9 g/dL — AB (ref 30.0–36.0)
MCV: 91.2 fL (ref 78.0–100.0)
PLATELETS: 237 10*3/uL (ref 150–400)
RBC: 4.07 MIL/uL — ABNORMAL LOW (ref 4.22–5.81)
RDW: 16.2 % — AB (ref 11.5–15.5)
WBC: 14.7 10*3/uL — ABNORMAL HIGH (ref 4.0–10.5)

## 2016-06-26 LAB — BLOOD GAS, ARTERIAL
ACID-BASE EXCESS: 8 mmol/L — AB (ref 0.0–2.0)
BICARBONATE: 34.9 mmol/L — AB (ref 20.0–28.0)
DRAWN BY: 39899
FIO2: 100
O2 SAT: 86.9 %
PEEP: 18 cmH2O
Patient temperature: 98.6
RATE: 30 resp/min
VT: 450 mL
pCO2 arterial: 80.1 mmHg (ref 32.0–48.0)
pH, Arterial: 7.261 — ABNORMAL LOW (ref 7.350–7.450)
pO2, Arterial: 61.3 mmHg — ABNORMAL LOW (ref 83.0–108.0)

## 2016-06-26 LAB — BASIC METABOLIC PANEL
Anion gap: 6 (ref 5–15)
BUN: 57 mg/dL — AB (ref 6–20)
CALCIUM: 8.6 mg/dL — AB (ref 8.9–10.3)
CO2: 34 mmol/L — ABNORMAL HIGH (ref 22–32)
Chloride: 111 mmol/L (ref 101–111)
Creatinine, Ser: 2.07 mg/dL — ABNORMAL HIGH (ref 0.61–1.24)
GFR calc Af Amer: 43 mL/min — ABNORMAL LOW (ref >60)
GFR, EST NON AFRICAN AMERICAN: 37 mL/min — AB (ref >60)
GLUCOSE: 184 mg/dL — AB (ref 65–99)
Potassium: 4 mmol/L (ref 3.5–5.1)
Sodium: 151 mmol/L — ABNORMAL HIGH (ref 135–145)

## 2016-06-26 LAB — HEPARIN LEVEL (UNFRACTIONATED)
HEPARIN UNFRACTIONATED: 0.72 [IU]/mL — AB (ref 0.30–0.70)
HEPARIN UNFRACTIONATED: 0.5 [IU]/mL (ref 0.30–0.70)
Heparin Unfractionated: 0.88 IU/mL — ABNORMAL HIGH (ref 0.30–0.70)

## 2016-06-26 LAB — MAGNESIUM: Magnesium: 2 mg/dL (ref 1.7–2.4)

## 2016-06-26 LAB — PHOSPHORUS: Phosphorus: 5.5 mg/dL — ABNORMAL HIGH (ref 2.5–4.6)

## 2016-06-26 MED ORDER — ROCURONIUM BROMIDE 50 MG/5ML IV SOLN
100.0000 mg | Freq: Once | INTRAVENOUS | Status: AC
  Administered 2016-06-26: 100 mg via INTRAVENOUS
  Filled 2016-06-26: qty 10

## 2016-06-26 MED ORDER — SENNOSIDES 8.8 MG/5ML PO SYRP
5.0000 mL | ORAL_SOLUTION | Freq: Two times a day (BID) | ORAL | Status: DC
  Administered 2016-06-26 – 2016-06-27 (×2): 5 mL
  Filled 2016-06-26 (×4): qty 5

## 2016-06-26 MED ORDER — FENTANYL BOLUS VIA INFUSION
25.0000 ug | INTRAVENOUS | Status: DC | PRN
  Filled 2016-06-26: qty 100

## 2016-06-26 MED ORDER — FAMOTIDINE IN NACL 20-0.9 MG/50ML-% IV SOLN
20.0000 mg | INTRAVENOUS | Status: DC
  Filled 2016-06-26: qty 50

## 2016-06-26 MED ORDER — PHENYLEPHRINE HCL 10 MG/ML IJ SOLN
0.0000 ug/min | INTRAVENOUS | Status: DC
  Administered 2016-06-26: 50 ug/min via INTRAVENOUS
  Administered 2016-06-27: 400 ug/min via INTRAVENOUS
  Administered 2016-06-27: 350 ug/min via INTRAVENOUS
  Administered 2016-06-27: 200 ug/min via INTRAVENOUS
  Administered 2016-06-27: 350 ug/min via INTRAVENOUS
  Administered 2016-06-27: 200 ug/min via INTRAVENOUS
  Filled 2016-06-26 (×6): qty 4

## 2016-06-26 MED ORDER — PHENYLEPHRINE HCL 10 MG/ML IJ SOLN
0.0000 ug/min | INTRAVENOUS | Status: DC
  Filled 2016-06-26: qty 1

## 2016-06-26 MED ORDER — FENTANYL CITRATE (PF) 2500 MCG/50ML IJ SOLN
25.0000 ug/h | Status: DC
  Administered 2016-06-26 – 2016-06-27 (×2): 400 ug/h via INTRAVENOUS
  Filled 2016-06-26 (×4): qty 100

## 2016-06-26 MED ORDER — PANTOPRAZOLE SODIUM 40 MG PO PACK
40.0000 mg | PACK | Freq: Every day | ORAL | Status: DC
  Administered 2016-06-26 – 2016-06-27 (×2): 40 mg
  Filled 2016-06-26 (×2): qty 20

## 2016-06-26 NOTE — Progress Notes (Signed)
eLink Physician-Brief Progress Note Patient Name: [name] DOB: 05/05/1971 MRN: [id number]   Date of Service  06/26/2016  HPI/Events of Note  CXR c/w diffuse bilateral airspace disease.  eICU Interventions  Will order: 1. Rocuronium 100 mg IV X 1 to see if any benefit from NMB.     Intervention Category Intermediate Interventions: Diagnostic test evaluation  [name],[name] 06/26/2016, 3:14 AM

## 2016-06-26 NOTE — Care Management Important Message (Signed)
Important Message  Patient Details  Name: [name] MRN: [id number] Date of Birth: 02/16/1971   Medicare Important Message Given:  Yes    Kyla BalzarineShealy, Jheri Mitter Abena 06/26/2016, 10:55 AM

## 2016-06-26 NOTE — Progress Notes (Signed)
PCCM Attending Note: Patient remains hypoxic with little improvement on inhaled nitric oxide. Nitric oxide subsequently discontinued. Stat portable chest x-ray reviewed without evidence of focal new opacity. Enteric feeding tube noted within mid to distal esophagus. Giving Lopressor for tachycardia. Contacted patient's "sister" Judeth CornfieldStephanie which is listed in electronic medical record. I was unable to reach Ms. Martyn EhrichKim Evans. I explained that his medical status is critical and he continues to deteriorate. I urged her to contact any family members who would like to see the patient to have them come to bedside as soon as possible as I feel he will continue to clinically worsen.  Donna ChristenJennings E. Jamison NeighborNestor, M.D. [facility] Pager:  [phone] After 3pm or if no response, call [phone] 4:52 PM 06/26/16

## 2016-06-26 NOTE — Progress Notes (Signed)
eLink Physician-Brief Progress Note Patient Name: [name] DOB: 05/29/1971 MRN: [id number]   Date of Service  06/26/2016  HPI/Events of Note  Hypoxia - Sat = 86% on 100%/PRVC 30/TV450/P 18. Ppeak = 37 and Pplat = 36.  eICU Interventions  Will order: 1. Portable CXR STAT.       Intervention Category Major Interventions: Hypoxemia - evaluation and management  Sanjeev Main Eugene 06/26/2016, 2:58 AM

## 2016-06-26 NOTE — Progress Notes (Signed)
After nitric oxide has been administered for 1 hour.   Ref. Range 06/26/2016 11:30  Total hemoglobin Latest Ref Range: 12.0 - 16.0 g/dL 16.112.8  Carboxyhemoglobin Latest Ref Range: 0.5 - 1.5 % 1.7 (H)  Methemoglobin Latest Ref Range: 0.0 - 1.5 % 0.6

## 2016-06-26 NOTE — Progress Notes (Signed)
ANTICOAGULATION CONSULT NOTE - Follow Up Consult  Pharmacy Consult for heparin Indication: Afib and h/o PE  Patient Measurements: Height: 6\' 3"  (190.5 cm) Weight: (!) 561 lb (254.5 kg) IBW/kg (Calculated) : 84.5 Heparin Dosing Weight: 152.8 kg  Labs:  Recent Labs  06/24/16 0442 06/25/16 0350 06/26/16 0333 06/26/16 0950 06/26/16 1700  HGB 10.6* 10.6* 11.1*  --   --   HCT 35.1* 35.3* 37.1*  --   --   PLT 242 250 237  --   --   HEPARINUNFRC 0.65 0.65 0.72* 0.88* 0.50  CREATININE 1.66* 1.79* 2.07*  --   --     Assessment: 45yo M admitted on 06/04/2016 who was on Xarelto PTA for atrial flutter/hx of PE to continue on heparin gtt. CBC stable, no bleeding documented.   PM heparin level therapeutic at 0.50  Goal of Therapy:  Heparin level 0.3-0.7 units/ml  Monitor platelets by anticoagulation protocol: Yes   Plan:  Continue heparin gtt at 2300 units/hr Follow up AM labs Monitor CBC, s/sx of bleeding  Thank you Okey RegalLisa Azlee Monforte, PharmD [phone] 06/26/2016 7:43 PM

## 2016-06-26 NOTE — Progress Notes (Signed)
PULMONARY / CRITICAL CARE MEDICINE   Name: [name] MRN: [id number] DOB: [birth year]    ADMISSION DATE:  06/29/2016 CONSULTATION DATE: 06/06/2016  REFERRING MD:  EDP  CHIEF COMPLAINT:  Shortness of breath  Brief: 45 year old male with a past medical history significant for diastolic heart failure with recurrent admissions to the intensive care unit over the last 1-2 years for respiratory failure was admitted on 06/17/2016 with acute respiratory fire with hypoxemia and septic shock secondary to healthcare associated pneumonia.  SUBJECTIVE:   Attempted paralytic yesterday with no significant improvement. Patient still positive 1.8L from yesterday. Patient did have high peak airway pressures yesterday that improved with suctioning of mucous plug. Renal function continuing continuing to worsen on Lasix drip with overall positive volume status.  REVIEW OF SYSTEMS:  Unable to obtain with intubation & sedation.  VITAL SIGNS: BP (!) 87/58   Pulse (!) 130   Temp (!) 102 F (38.9 C) (Oral)   Resp (!) 30   Ht 6\' 3"  (1.905 m)   Wt (!) 561 lb (254.5 kg)   SpO2 (!) 88%   BMI 70.12 kg/m   HEMODYNAMICS:    VENTILATOR SETTINGS: Vent Mode: PRVC FiO2 (%):  [100 %] 100 % Set Rate:  [28 bmp-30 bmp] 30 bmp Vt Set:  [450 mL-500 mL] 450 mL PEEP:  [12 cmH20-18 cmH20] 18 cmH20 Plateau Pressure:  [32 cmH20-37 cmH20] 36 cmH20  INTAKE / OUTPUT: I/O last 3 completed shifts: In: 7112 [I.V.:3090; NG/GT:2890; IV Piggyback:1132] Out: 7475 [Urine:7475]  PHYSICAL EXAMINATION: General: Morbidly obese young adult male in NAD. No family at bedside. HEENT: Endotracheal tube in place. No scleral icterus or injection. Pulm: Distant breath sounds bilaterally. Symmetric chest wall rise on ventilator. Peak and plateau airway pressures approximately 35 mmHg. Cardiovascular: Tachycardic with regular rhythm. Sinus tachycardia on telemetry. No edema appreciated. Unable to appreciate JVD given body habitus. GI:  Soft. Protuberant. Hypoactive bowel sounds. Dermatologic: Warm and dry. No rash on exposed skin. Neurologic: Sedated. Pupils symmetric. No spontaneous movements.  LABS:  BMET  Recent Labs Lab 06/24/16 0442 06/25/16 0350 06/26/16 0333  NA 156* 155* 151*  K 4.2 3.8 4.0  CL 112* 111 111  CO2 35* 35* 34*  BUN 31* 42* 57*  CREATININE 1.66* 1.79* 2.07*  GLUCOSE 168* 165* 184*    Electrolytes  Recent Labs Lab 06/20/16 0327 06/20/16 1656  06/22/16 0429  06/24/16 0442 06/25/16 0350 06/26/16 0333  CALCIUM 8.1*  --   < > 8.3*  < > 8.8* 8.6* 8.6*  MG 1.8 1.8  --  2.0  --   --   --  2.0  PHOS 2.0* 2.9  --   --   --   --   --  5.5*  < > = values in this interval not displayed.  CBC  Recent Labs Lab 06/24/16 0442 06/25/16 0350 06/26/16 0333  WBC 12.8* 13.1* 14.7*  HGB 10.6* 10.6* 11.1*  HCT 35.1* 35.3* 37.1*  PLT 242 250 237    Coag's  Recent Labs Lab 06/19/16 0922  APTT 41*    Sepsis Markers No results for input(s): LATICACIDVEN, PROCALCITON, O2SATVEN in the last 168 hours.  ABG  Recent Labs Lab 06/20/16 1100 06/25/16 1055 06/25/16 2102  PHART 7.401 7.368 7.354  PCO2ART 44.6 62.3* 66.8*  PO2ART 85.7 75.0* 118.0*    Liver Enzymes  Recent Labs Lab 06/21/16 0836 06/24/16 0442 06/25/16 0350  AST 278* 93* 65*  ALT 139* 69* 51  ALKPHOS 66 69 64  BILITOT 1.0 1.0 0.8  ALBUMIN 2.2* 2.1* 2.2*    Cardiac Enzymes No results for input(s): TROPONINI, PROBNP in the last 168 hours.  Glucose  Recent Labs Lab 06/25/16 0828 06/25/16 1139 06/25/16 1644 06/25/16 2029 06/26/16 0028 06/26/16 0320  GLUCAP 143* 158* 140* 156* 162* 170*    Imaging Dg Chest Port 1 View  Result Date: 06/26/2016 CLINICAL DATA:  45 year old male with hypoxia. Evaluate for endotracheal tube. EXAM: PORTABLE CHEST 1 VIEW COMPARISON:  Chest radiograph dated 06/24/2016 FINDINGS: Endotracheal tube is seen with tip approximately 5.8 cm above the carina in similar positioning as  the prior study. An enteric tube courses into the left upper abdomen with tip beyond the inferior margin of the image. There has been interval progression of bilateral airspace opacities with silhouetting of the diaphragms and cardiac border. There is no pneumothorax. No acute osseous pathology identified. IMPRESSION: Endotracheal tube above the carina. Interval progression and worsening of the diffuse airspace opacities, likely ARDS, alveolar edema/hemorrhage, or pneumonia. Clinical correlation and follow-up recommended. Electronically Signed   By: Elgie CollardArash  Radparvar M.D.   On: 06/26/2016 03:29    STUDIES:  TTE (05/10/15):  EF 55-60% w/ A fib. No AS or AR. No MS or MR. RV normal in size & function. PA normal in size. No pericardial effusion.  MICROBIOLOGY: HIV 6/28:  Nonreactive Blood Ctx x1 9/17: Negative MRSA PCR 9/18:  Negative  Urine Ctx 9/17:  Insignificant growth Tracheal Asp Ctx 9/18:  Oral Flora Blood Ctx x2 9/18:  Negative  Tracheal Asp Ctx 9/22 >> Enterobacter  ANTIBIOTICS: Zosyn 9/17 - 9/24 Vancomycin 9/17 - 9/20; 9/23 >>  Merrem 9/24 >>  SIGNIFICANT EVENTS: 9/17  Admit with septic shock secondary to HCAP  9/21  Spiked fever 9/23  Worsening oxygenation, ARDS protocol  9/24  PEEP 16, FiO2 100%  LINES/TUBES: OETT 8.0 9/17 >> OGT 9/17 >> RUE PICC 9/18 >>  Foley 9/18 >> PIV  ASSESSMENT / PLAN:  PULMONARY A: Acue on Chronic Hypoxic & Hypercarbic Respiratory Failure - Labs suggest chronic hypercarbia. HCAP  P:   ARDS Optimization @ 6cc/kg IBW Decrease Lasix gtt to 4 mg/hr Starting Inhaled NO 20ppm  CARDIOVASCULAR A:  Shock - Resolved. Secondary to sedation/medications. Decompensated Diastolic CHF H/O Atrial flutter  P:  Continuous Telemetry Monitoring Vitals per unit Protocol Heparin gtt per Pharmacy Protocol KVO IV fluids Diuresis as above D/C Scheduled Lopressor  RENAL A:   Acute Renal Failure - Previous Creatinine 1.14 April 2016.   Hypernatremia Hyperchloremia - Mild. Metabolic Alkalosis - Mild. Hyperphosphatemia - Mild.  P:   Trending UOP with foley Monitoring renal function & electrolytes Replace electrolytes as needed Continue Free water VT 250 ml Q4  GASTROINTESTINAL A:   Acute Liver Injury - shock liver, improved, but now plateau with mild elevation. Constipation  P:   NPO Continuing Tube Feedings Trending LFTs intermittently Changing to Protonix VT daily Starting Senna VT bid  HEMATOLOGIC A:   Coagulopathy - Secondary to chronic Xarelto tx. Currently on Heparin gtt. Leukocytosis - Likely secondary to sepsis. Anemia - Mild. No signs of active bleeding.  P:  Trending cell counts daily w/ CBC Heparin gtt per pharmacy protocol  INFECTIOUS A:   Sepsis - secondary to healthcare associated pneumonia FUO - 9/22  P:   Day #3 of Vancomycin & Day # 2 of Merrem Follow cultures as above  ENDOCRINE A:   H/O DM Type 2 - Uncontrolled at baseline.  P:   Levemir 55u [place]enton q12hr  SSI Accu-Checks q4hr  NEUROLOGIC A:   Acute Encephalopathy - On presentation. Likely secondary to hypoxia & hypercarbia. Sedation on Ventilator  P:   RASS goal: -4 Fentanyl gtt & IV prn Pain Versed gtt & IV prn Sedation   FAMILY  - Updates: No family at bedside 8/25.   - Inter-disciplinary family meet or Palliative Care meeting due by:  9/24  TODAY'S SUMMARY:  45 y.o. male with morbid obesity here with septic shock secondary to healthcare acquired pneumonia leading to acute respiratory failure with hypoxemia requiring full ventilatory support. Also has a history of diastolic heart failure. Continuing low tidal volume ventilation. Applying inhaled pulmonary arterial vasodilator therapy to optimize V/Q matching. Attempting to minimize IV fluid input. Decreasing Lasix drip slightly given worsening renal function. Overall prognosis is poor.  I have spent a total of 33 minutes of critical care time today caring for the  patient and reviewing the patient's electronic medical record.   Donna Christen Jamison Neighbor, M.D. [facility] Pager:  [phone] After 3pm or if no response, call [phone] 6:59 AM 06/26/16

## 2016-06-26 NOTE — Progress Notes (Signed)
Placed PT on NO at 20ppm per MD order.  Currently tolerating well, will continue to monitor.

## 2016-06-26 NOTE — Progress Notes (Signed)
ANTICOAGULATION CONSULT NOTE - Follow Up Consult  Pharmacy Consult for heparin Indication: Afib and h/o PE  Patient Measurements: Height: 6\' 3"  (190.5 cm) Weight: (!) 561 lb (254.5 kg) IBW/kg (Calculated) : 84.5 Heparin Dosing Weight: 152.8 kg  Labs:  Recent Labs  06/24/16 0442 06/25/16 0350 06/26/16 0333 06/26/16 0950  HGB 10.6* 10.6* 11.1*  --   HCT 35.1* 35.3* 37.1*  --   PLT 242 250 237  --   HEPARINUNFRC 0.65 0.65 0.72* 0.88*  CREATININE 1.66* 1.79* 2.07*  --     Assessment: 45yo M admitted on December 04, 2015 who was on Xarelto PTA for atrial flutter/hx of PE to continue on heparin gtt. CBC stable, no bleeding documented. HL today came back supratherapeutic at 0.88 even after dose decrease - suggests patient is accumulating heparin.  Goal of Therapy:  Heparin level 0.3-0.7 units/ml  Monitor platelets by anticoagulation protocol: Yes   Plan:  Decrease heparin gtt to 2300 units/hr Check heparin level in 6 hours and daily Monitor CBC, s/sx of bleeding  Mackie Paienee Ackley, PharmD PGY1 Pharmacy Resident Pager: [phone] 06/26/2016 10:21 AM

## 2016-06-26 NOTE — Progress Notes (Signed)
ANTICOAGULATION CONSULT NOTE - Follow Up Consult  Pharmacy Consult for heparin Indication: Afib and h/o PE   Labs:  Recent Labs  06/23/16 1800 06/24/16 0442 06/25/16 0350 06/26/16 0333  HGB  --  10.6* 10.6* 11.1*  HCT  --  35.1* 35.3* 37.1*  PLT  --  242 250 237  HEPARINUNFRC  --  0.65 0.65 0.72*  CREATININE 1.63* 1.66* 1.79*  --     Assessment: 45yo male now slightly above goal on heparin after several levels at goal at current rate though levels had been trending up.  Goal of Therapy:  Heparin level 0.3-0.7 units/ml   Plan:  Will decrease heparin gtt slightly to 2600 units/hr and check level in 6hr.  [name], PharmD, BCPS  06/26/2016,3:55 AM

## 2016-06-27 ENCOUNTER — Inpatient Hospital Stay (HOSPITAL_COMMUNITY): Payer: Medicare Other

## 2016-06-27 DIAGNOSIS — L8990 Pressure ulcer of unspecified site, unspecified stage: Secondary | ICD-10-CM | POA: Insufficient documentation

## 2016-06-27 DIAGNOSIS — J9602 Acute respiratory failure with hypercapnia: Secondary | ICD-10-CM

## 2016-06-27 DIAGNOSIS — R6521 Severe sepsis with septic shock: Secondary | ICD-10-CM

## 2016-06-27 DIAGNOSIS — R69 Illness, unspecified: Secondary | ICD-10-CM

## 2016-06-27 DIAGNOSIS — A419 Sepsis, unspecified organism: Principal | ICD-10-CM

## 2016-06-27 LAB — BLOOD GAS, ARTERIAL
Acid-Base Excess: 5.6 mmol/L — ABNORMAL HIGH (ref 0.0–2.0)
Bicarbonate: 31.9 mmol/L — ABNORMAL HIGH (ref 20.0–28.0)
Drawn by: 345601
FIO2: 100
MECHVT: 450 mL
O2 SAT: 78.1 %
PATIENT TEMPERATURE: 103
PEEP: 18 cmH2O
PH ART: 7.249 — AB (ref 7.350–7.450)
RATE: 30 resp/min
pCO2 arterial: 78.4 mmHg (ref 32.0–48.0)
pO2, Arterial: 53.9 mmHg — ABNORMAL LOW (ref 83.0–108.0)

## 2016-06-27 LAB — COMPREHENSIVE METABOLIC PANEL
ALT: 105 U/L — ABNORMAL HIGH (ref 17–63)
ANION GAP: 13 (ref 5–15)
AST: 401 U/L — ABNORMAL HIGH (ref 15–41)
Albumin: 2.1 g/dL — ABNORMAL LOW (ref 3.5–5.0)
Alkaline Phosphatase: 66 U/L (ref 38–126)
BUN: 87 mg/dL — ABNORMAL HIGH (ref 6–20)
CALCIUM: 8.7 mg/dL — AB (ref 8.9–10.3)
CHLORIDE: 107 mmol/L (ref 101–111)
CO2: 32 mmol/L (ref 22–32)
Creatinine, Ser: 4.17 mg/dL — ABNORMAL HIGH (ref 0.61–1.24)
GFR calc non Af Amer: 16 mL/min — ABNORMAL LOW (ref >60)
GFR, EST AFRICAN AMERICAN: 18 mL/min — AB (ref >60)
Glucose, Bld: 323 mg/dL — ABNORMAL HIGH (ref 65–99)
Potassium: 4.6 mmol/L (ref 3.5–5.1)
SODIUM: 152 mmol/L — AB (ref 135–145)
Total Bilirubin: 0.7 mg/dL (ref 0.3–1.2)
Total Protein: 7.2 g/dL (ref 6.5–8.1)

## 2016-06-27 LAB — HEPARIN LEVEL (UNFRACTIONATED): HEPARIN UNFRACTIONATED: 0.72 [IU]/mL — AB (ref 0.30–0.70)

## 2016-06-27 LAB — GLUCOSE, CAPILLARY
GLUCOSE-CAPILLARY: 218 mg/dL — AB (ref 65–99)
GLUCOSE-CAPILLARY: 274 mg/dL — AB (ref 65–99)
GLUCOSE-CAPILLARY: 373 mg/dL — AB (ref 65–99)
Glucose-Capillary: 319 mg/dL — ABNORMAL HIGH (ref 65–99)

## 2016-06-27 LAB — BASIC METABOLIC PANEL
ANION GAP: 15 (ref 5–15)
BUN: 90 mg/dL — ABNORMAL HIGH (ref 6–20)
CHLORIDE: 105 mmol/L (ref 101–111)
CO2: 26 mmol/L (ref 22–32)
CREATININE: 5.6 mg/dL — AB (ref 0.61–1.24)
Calcium: 8.5 mg/dL — ABNORMAL LOW (ref 8.9–10.3)
GFR calc non Af Amer: 11 mL/min — ABNORMAL LOW (ref >60)
GFR, EST AFRICAN AMERICAN: 13 mL/min — AB (ref >60)
Glucose, Bld: 454 mg/dL — ABNORMAL HIGH (ref 65–99)
POTASSIUM: 4.3 mmol/L (ref 3.5–5.1)
SODIUM: 146 mmol/L — AB (ref 135–145)

## 2016-06-27 LAB — CBC
HCT: 42.5 % (ref 39.0–52.0)
Hemoglobin: 12.3 g/dL — ABNORMAL LOW (ref 13.0–17.0)
MCH: 27.2 pg (ref 26.0–34.0)
MCHC: 28.9 g/dL — AB (ref 30.0–36.0)
MCV: 94 fL (ref 78.0–100.0)
PLATELETS: 285 10*3/uL (ref 150–400)
RBC: 4.52 MIL/uL (ref 4.22–5.81)
RDW: 16.5 % — AB (ref 11.5–15.5)
WBC: 22 10*3/uL — ABNORMAL HIGH (ref 4.0–10.5)

## 2016-06-27 LAB — PHOSPHORUS: Phosphorus: 5.6 mg/dL — ABNORMAL HIGH (ref 2.5–4.6)

## 2016-06-27 LAB — LACTIC ACID, PLASMA: LACTIC ACID, VENOUS: 6.7 mmol/L — AB (ref 0.5–1.9)

## 2016-06-27 LAB — PROCALCITONIN: PROCALCITONIN: 65.34 ng/mL

## 2016-06-27 LAB — MAGNESIUM
MAGNESIUM: 2.1 mg/dL (ref 1.7–2.4)
Magnesium: 2 mg/dL (ref 1.7–2.4)

## 2016-06-27 MED ORDER — INSULIN NPH (HUMAN) (ISOPHANE) 100 UNIT/ML ~~LOC~~ SUSP: 40.0000 [IU] | Freq: Four times a day (QID) | SUBCUTANEOUS | Status: DC

## 2016-06-27 MED ORDER — SODIUM BICARBONATE 8.4 % IV SOLN
100.0000 meq | Freq: Once | INTRAVENOUS | Status: AC
  Administered 2016-06-27: 100 meq via INTRAVENOUS
  Filled 2016-06-27: qty 100

## 2016-06-27 MED ORDER — INSULIN NPH (HUMAN) (ISOPHANE) 100 UNIT/ML ~~LOC~~ SUSP
20.0000 [IU] | Freq: Four times a day (QID) | SUBCUTANEOUS | Status: DC
  Administered 2016-06-27: 20 [IU] via SUBCUTANEOUS
  Filled 2016-06-27: qty 10

## 2016-06-27 MED ORDER — SODIUM BICARBONATE 8.4 % IV SOLN
INTRAVENOUS | Status: DC
  Administered 2016-06-27: 01:00:00 via INTRAVENOUS
  Filled 2016-06-27 (×2): qty 850

## 2016-06-27 MED ORDER — SODIUM BICARBONATE 8.4 % IV SOLN
INTRAVENOUS | Status: AC
  Filled 2016-06-27: qty 100

## 2016-06-27 MED ORDER — NOREPINEPHRINE BITARTRATE 1 MG/ML IV SOLN
2.0000 ug/min | INTRAVENOUS | Status: DC
  Administered 2016-06-27: 10 ug/min via INTRAVENOUS
  Administered 2016-06-27: 30 ug/min via INTRAVENOUS
  Filled 2016-06-27: qty 4

## 2016-06-27 MED ORDER — AMIODARONE HCL IN DEXTROSE 360-4.14 MG/200ML-% IV SOLN
60.0000 mg/h | INTRAVENOUS | Status: AC
  Administered 2016-06-27: 60 mg/h via INTRAVENOUS
  Filled 2016-06-27: qty 200

## 2016-06-27 MED ORDER — AMIODARONE HCL IN DEXTROSE 360-4.14 MG/200ML-% IV SOLN
30.0000 mg/h | INTRAVENOUS | Status: DC
  Filled 2016-06-27: qty 200

## 2016-06-27 MED ORDER — SODIUM CHLORIDE 0.9 % IV SOLN
1.0000 g | Freq: Two times a day (BID) | INTRAVENOUS | Status: DC
  Filled 2016-06-27: qty 1

## 2016-06-27 MED ORDER — EPINEPHRINE HCL 0.1 MG/ML IJ SOSY
PREFILLED_SYRINGE | INTRAMUSCULAR | Status: AC
  Filled 2016-06-27: qty 40

## 2016-06-27 MED ORDER — NOREPINEPHRINE BITARTRATE 1 MG/ML IV SOLN
2.0000 ug/min | INTRAVENOUS | Status: DC
  Administered 2016-06-27: 35 ug/min via INTRAVENOUS
  Filled 2016-06-27: qty 16

## 2016-06-27 MED ORDER — SODIUM CHLORIDE 0.9 % IV SOLN
2.0000 ug/min | INTRAVENOUS | Status: DC
  Administered 2016-06-27: 70 ug/min via INTRAVENOUS
  Filled 2016-06-27 (×3): qty 16

## 2016-06-27 MED ORDER — DIGOXIN 0.25 MG/ML IJ SOLN
0.2500 mg | Freq: Once | INTRAMUSCULAR | Status: AC
  Administered 2016-06-27: 0.25 mg via INTRAVENOUS
  Filled 2016-06-27: qty 2

## 2016-06-27 MED ORDER — VASOPRESSIN 20 UNIT/ML IV SOLN
0.0400 [IU]/min | INTRAVENOUS | Status: DC
  Administered 2016-06-27: 0.04 [IU]/min via INTRAVENOUS
  Filled 2016-06-27: qty 2

## 2016-06-27 MED FILL — Medication: Qty: 1 | Status: AC

## 2016-06-28 ENCOUNTER — Ambulatory Visit: Payer: Self-pay | Admitting: Dietician

## 2016-06-29 LAB — CULTURE, RESPIRATORY: SPECIAL REQUESTS: NORMAL

## 2016-06-29 LAB — CULTURE, RESPIRATORY W GRAM STAIN: Culture: NORMAL

## 2016-07-02 NOTE — Care Management Note (Signed)
Case Management Note  Patient Details  Name: [name] MRN: [id number] Date of Birth: 02/26/1971  Subjective/Objective:    Pt presented to the ED via EMS with complaints of shortness of breath. He was placed on CPAP en route with little improvement and was intubated shortly after arrival here. He was also found to be markedly hyperglycemic with glucose > 700.                 Action/Plan:  PTA from home alone, on supplemental oxygen.  Pt is now intubated.  CM will continue to monitor for disposition needs   Expected Discharge Date:                  Expected Discharge Plan:     In-House Referral:     Discharge planning Services  CM Consult  Post Acute Care Choice:    Choice offered to:     DME Arranged:    DME Agency:     HH Arranged:    HH Agency:     Status of Service:  In process, will continue to follow  If discussed at Long Length of Stay Meetings, dates discussed:    Additional Comments: Pt remained intubated up until his expiration 06/29/2016 Cherylann Parrlaxton, Demba Nigh S, RN 06/09/2016, 3:23 PM

## 2016-07-02 NOTE — Progress Notes (Signed)
Inpatient Diabetes Program Recommendations  AACE/ADA: New Consensus Statement on Inpatient Glycemic Control (2015)  Target Ranges:  Prepandial:   less than 140 mg/dL      Peak postprandial:   less than 180 mg/dL (1-2 hours)      Critically ill patients:  140 - 180 mg/dL   Lab Results  Component Value Date   GLUCAP 373 (H) 06/17/2016   HGBA1C 9.2 (H) 03/30/2016    Inpatient Diabetes Program Recommendations:     Please consider ICU Glycemic Control order set, phase 2-IV insulin.   Thanks, Beryl MeagerJenny Queena Monrreal, RN, BC-ADM Inpatient Diabetes Coordinator Pager [phone] (8a-5p)

## 2016-07-02 NOTE — Code Documentation (Signed)
PCCM Attending Code Note: Patient went into asystolic cardiac arrest. Amiodarone infusion was stopped and ACLS algorithm was initiated. During CPR patient was given bolus bicarbonate, calcium chloride, and epinephrine. Levophed, Neo-Synephrine, and vasopressin infusions were continued during the course of resuscitation. Despite our best efforts the patient did not regain spontaneous circulation and was pronounced deceased by me at 1320 p.m. today. I contacted the patient's brother via phone informing him of his brother's passing. He requested that we hold the body for him.  Donna ChristenJennings E. Jamison NeighborNestor, M.D. [facility] Pager:  [phone] After 3pm or if no response, call [phone] 1:26 PM Oct 16, 2015

## 2016-07-02 NOTE — Code Documentation (Signed)
PCCM Attending Code Note: Called to bedside by nursing staff this patient went into cardiac arrest with pulseless electrical activity. Resuscitation already underway at the time of my arrival. Patient underwent ACLS algorithm for resuscitation. Bolus bicarbonate and calcium chloride were infused along with bolus epinephrine. Ultimately patient regained spontaneous circulation. Patient's brother was notified during resuscitation efforts of his further clinical decline and cardiac arrest. I explained that this is a consequence of his multisystem organ failure and likely to recur. He requested I continue to resuscitate the patient despite, as I explained, futile efforts.   Evan ChristenJennings E. Jamison NeighborNestor, M.D. [facility] Pager:  [phone] After 3pm or if no response, call [phone] 12:41 PM 06/05/2016

## 2016-07-02 NOTE — Discharge Summary (Signed)
Death Note: For complete accounting of the patient's history and physical exam on presentation please refer to the admission H&P dictated on 06/25/2016. Patient was a 45 year old male with known history of super morbid obesity, OSA/OHS, history of atrial flutter, depression, chronic renal failure, diabetes, and diastolic congestive heart failure. He presented with shortness of breath and failed noninvasive positive pressure ventilation requiring endotracheal intubation in the emergency department. On admission patient was noted to be hyperglycemic with a mixed acidosis. Patient's overall clinical picture appeared to be secondary to severe community acquired pneumonia. He was treated with broad-spectrum antibiotic coverage including vancomycin and Zosyn with improving ventilator requirements. Despite broad-spectrum antibiotic therapy the patient began to clinically worsen on 9/22 despite diuresis. Patient developed ARDS with worsening oxygenation as well as ventilation and despite maximal ventilator support. I attempted and hold pulmonary arterial vasodilator therapy in an effort to improve VQ matching and oxygenation without success. Ultimately diuresis with Lasix was discontinued due to progressively worsening shock requiring increasing vasopressor support with vasopressin, Neo-Synephrine, and Levophed. Patient developed progressively worsening acute renal failure and hypernatremia with diuresis. Despite broad-spectrum antibiotic therapy with vancomycin and meropenem patient was discovered to have a healthcare associated pneumonia with enterococcus and presumably a fever secondary to this infection starting on 9/22. Patient developed worsening atrial fibrillation with rapid ventricular response requiring amiodarone infusion with little effect. IV digoxin was given in an effort to help with patient's arrhythmia. Patient also developed acute liver injury likely from shock and hypoxia. Patient's local friends were  updated routinely. The evening of 9/25 we attempted to contact the patient's brother and it wasn't until I was able to reach the patient's nephew on 07-01-23 and get a better phone number for his brother Tinnie Gens that I was able to contact him to update him on his brothers critical status. I explained that with his multisystem organ failure I did not expect him to survive. His brother requested that I do everything possible to keep his brother alive and that he would not "pull the plug" on his brother "killing him". I explained that this was not the purpose of my call. On 07/01/23 the patient suffered a cardiac arrest as a result of his multisystem organ failure. We were able to achieve return of spontaneous circulation. I contacted the patient's brother to update him on this continued to decline and he again requested that we continue to do everything we could to support the patient and "keep him alive". Patient's friends and pastor were available at bedside and briefly updated regarding his critical status and clinical decline. Patient suffered a final asystolic cardiac arrest on July 01, 2023 and we were unable to achieve return of spontaneous circulation despite our best efforts. He was pronounced dead by me at 1:20 PM on 2016-06-30. I personally contacted the patient's brother to make him aware of his passing and he requested that we keep his body until his arrival.  Diagnoses at Death: 1. Septic shock 2. Atrial fibrillation with rapid ventricular response 3. Acute on chronic hypoxic and hypercarbic respiratory failure  4. Hypernatremia 5. Acute renal failure 6. Acute diastolic congestive heart failure  7. Acute liver failure  8. Acute encephalopathy  9. Enterococcus healthcare associated pneumonia  10. Severe community acquired pneumonia  11. Super morbid obesity 12. History of diabetes mellitus type 2 13. History of atrial flutter  14. History of asthma  15. History of chronic kidney disease stage II 16. History  of hypertension 17. History of sleep apnea 18. History of  obesity hypoventilation syndrome

## 2016-07-02 NOTE — Progress Notes (Signed)
ANTICOAGULATION CONSULT NOTE - Follow Up Consult  Pharmacy Consult for heparin Indication: Afib and h/o PE   Labs:  Recent Labs  06/25/16 0350 06/26/16 0333 06/26/16 0950 06/26/16 1700 06-11-16 0433 06-11-16 0434  HGB 10.6* 11.1*  --   --  12.3*  --   HCT 35.3* 37.1*  --   --  42.5  --   PLT 250 237  --   --  285  --   HEPARINUNFRC 0.65 0.72* 0.88* 0.50  --  0.72*  CREATININE 1.79* 2.07*  --   --   --   --     Assessment: 45yo male now slightly above goal on heparin after one level at goal after rate decreases, apparently heparin now accumulating after high heparin requirements and had been at goal with high rate.  Goal of Therapy:  Heparin level 0.3-0.7 units/ml   Plan:  Will decrease heparin gtt to 2300 units/hr and check level in 6hr.  Vernard GamblesVeronda Katera Rybka, PharmD, BCPS  2016/08/23,5:11 AM

## 2016-07-02 NOTE — Progress Notes (Signed)
PULMONARY / CRITICAL CARE MEDICINE   Name: [name] MRN: [id number] DOB: 03/02/1971    ADMISSION DATE:  06/20/2016 CONSULTATION DATE: 06/08/2016  REFERRING MD:  EDP  CHIEF COMPLAINT:  Shortness of breath  Brief: 45 year old male with a past medical history significant for diastolic heart failure with recurrent admissions to the intensive care unit over the last 1-2 years for respiratory failure was admitted on 06/28/2016 with acute respiratory fire with hypoxemia and septic shock secondary to healthcare associated pneumonia.  SUBJECTIVE:   Overnight patient became more hypotensive & tachycardic. Escalating pressor requirement. Stopped Lasix gtt due to hypotension.   REVIEW OF SYSTEMS:  Unable to obtain with intubation & sedation.  VITAL SIGNS: BP (!) 83/36   Pulse (!) 137   Temp (!) 104 F (40 C) (Axillary) Comment: notified RN  Resp (!) 30   Ht 6\' 3"  (1.905 m)   Wt (!) 566 lb (256.7 kg)   SpO2 (!) 84%   BMI 70.75 kg/m   HEMODYNAMICS:    VENTILATOR SETTINGS: Vent Mode: PRVC FiO2 (%):  [100 %] 100 % Set Rate:  [30 bmp] 30 bmp Vt Set:  [450 mL] 450 mL PEEP:  [18 cmH20] 18 cmH20 Plateau Pressure:  [23 cmH20-46 cmH20] 41 cmH20  INTAKE / OUTPUT: I/O last 3 completed shifts: In: 8046.9 [I.V.:4430.9; NG/GT:2300; IV Piggyback:1316] Out: 2485 [Urine:2485]  PHYSICAL EXAMINATION: General: Morbidly obese young adult male. No family at bedside.  HEENT: Endotracheal tube in place. No scleral icterus or injection. Pulm: Distant breath sounds bilaterally. Symmetric chest wall rise on ventilator.  Cardiovascular: Tachycardic. Irregular rhythm on telemetry. No edema appreciated. Unable to appreciate JVD given body habitus. GI: Soft. Protuberant. Hypoactive bowel sounds. Dermatologic: Warm and dry. No rash on exposed skin. Neurologic: Sedated. Pupils symmetric. No spontaneous movements.  LABS:  BMET  Recent Labs Lab 06/25/16 0350 06/26/16 0333 07-22-16 0433  NA 155*  151* 152*  K 3.8 4.0 4.6  CL 111 111 107  CO2 35* 34* 32  BUN 42* 57* 87*  CREATININE 1.79* 2.07* 4.17*  GLUCOSE 165* 184* 323*    Electrolytes  Recent Labs Lab 06/20/16 1656  06/22/16 0429  06/25/16 0350 06/26/16 0333 July 22, 2016 0433  CALCIUM  --   < > 8.3*  < > 8.6* 8.6* 8.7*  MG 1.8  --  2.0  --   --  2.0 2.0  PHOS 2.9  --   --   --   --  5.5* 5.6*  < > = values in this interval not displayed.  CBC  Recent Labs Lab 06/25/16 0350 06/26/16 0333 07-22-2016 0433  WBC 13.1* 14.7* 22.0*  HGB 10.6* 11.1* 12.3*  HCT 35.3* 37.1* 42.5  PLT 250 237 285    Coag's No results for input(s): APTT, INR in the last 168 hours.  Sepsis Markers No results for input(s): LATICACIDVEN, PROCALCITON, O2SATVEN in the last 168 hours.  ABG  Recent Labs Lab 06/26/16 1050 06/26/16 1305 2016/07/22 0125  PHART 7.223* 7.261* 7.249*  PCO2ART 86.6* 80.1* 78.4*  PO2ART 57.0* 61.3* 53.9*    Liver Enzymes  Recent Labs Lab 06/24/16 0442 06/25/16 0350 07/22/16 0433  AST 93* 65* 401*  ALT 69* 51 105*  ALKPHOS 69 64 66  BILITOT 1.0 0.8 0.7  ALBUMIN 2.1* 2.2* 2.1*    Cardiac Enzymes No results for input(s): TROPONINI, PROBNP in the last 168 hours.  Glucose  Recent Labs Lab 06/26/16 0723 06/26/16 1227 06/26/16 1507 07-22-16 0032 July 22, 2016 0416 07/22/16 0730  GLUCAP  165* 195* 193* 274* 319* 373*    Imaging Dg Chest Port 1 View  Result Date: 06/26/2016 CLINICAL DATA:  Decreased O2 sats.  Respirations compromised. EXAM: PORTABLE CHEST 1 VIEW COMPARISON:  06/26/2016 FINDINGS: Endotracheal tube is appropriately positioned above the carina. Right arm central line appears to terminate in the right subclavian region. Nasogastric tube extends into the abdomen. There is extensive bilateral airspace disease with markedly increased consolidation in the upper lungs. Cardiac silhouette is obscured by the lung densities. IMPRESSION: Worsening bilateral airspace disease, particularly in the  upper lungs. Support apparatuses as described. Electronically Signed   By: Richarda Overlie M.D.   On: 06/26/2016 16:31   Dg Abd Portable 1v  Result Date: 06/26/2016 CLINICAL DATA:  Feeding tube placement. EXAM: PORTABLE ABDOMEN - 1 VIEW COMPARISON:  06/20/2016 FINDINGS: Enteric catheter is seen overlying the gastric body. Bowel gas pattern is nonobstructive on this limited abdominal radiograph. IMPRESSION: Enteric catheter overlies gastric body. Electronically Signed   By: Ted Mcalpine M.D.   On: 06/26/2016 14:26    STUDIES:  TTE (05/10/15):  EF 55-60% w/ A fib. No AS or AR. No MS or MR. RV normal in size & function. PA normal in size. No pericardial effusion.  MICROBIOLOGY: HIV 6/28:  Nonreactive Blood Ctx x1 9/17: Negative MRSA PCR 9/18:  Negative  Urine Ctx 9/17:  Insignificant growth Tracheal Asp Ctx 9/18:  Oral Flora Blood Ctx x2 9/18:  Negative  Tracheal Asp Ctx 9/22: Enterobacter (sensitive to Merrem)  ANTIBIOTICS: Zosyn 9/17 - 9/24 Vancomycin 9/17 - 9/20; 9/23 >>  Merrem 9/24 >>  SIGNIFICANT EVENTS: 9/17  Admit with septic shock secondary to HCAP  9/21  Spiked fever 9/23  Worsening oxygenation, ARDS protocol  9/24  PEEP 16, FiO2 100%  LINES/TUBES: OETT 8.0 9/17 >> OGT 9/17 >> RUE PICC 9/18 >>  Foley 9/18 >> PIV  ASSESSMENT / PLAN:  PULMONARY A: Acue on Chronic Hypoxic & Hypercarbic Respiratory Failure - Labs suggest chronic hypercarbia. Refractory to inhaled NO. HCAP  P:   ARDS Optimization @ 6cc/kg IBW Holding Lasix gtt due to hypotension.  CARDIOVASCULAR A:  Shock - Refractory & worsening. Secondary to sedation/medications. Decompensated Diastolic CHF H/O Atrial flutter - accelerated rate.  P:  Continuous Telemetry Monitoring Vitals per unit Protocol Heparin gtt per Pharmacy Protocol KVO IV fluids Holding on diuresis Levophed, Neo, & Vasopressin to maintain MAP >65 Starting Amiodarone gtt Checking EKG now  RENAL A:   Acute Renal Failure -  Previous Creatinine 1.14 April 2016. Worsening. Hypernatremia - Slight worsening. Hyperchloremia - Mild. Metabolic Alkalosis - Mild. Hyperphosphatemia - Mild.  P:   Trending UOP with foley Monitoring renal function & electrolytes Replace electrolytes as needed Continue Free water VT 250 ml Q4 Holding further diuresis Discontinuing Bicarb gtt  GASTROINTESTINAL A:   Acute Liver Injury - Likely due to shock liver. Now worsening. Constipation  P:   NPO Continuing Tube Feedings Trending LFTs intermittently Protonix VT daily Senna VT bid  HEMATOLOGIC A:   Coagulopathy - Secondary to chronic Xarelto tx. Currently on Heparin gtt. Leukocytosis - Likely secondary to sepsis. Worsening. Anemia - Mild. No signs of active bleeding.  P:  Trending cell counts daily w/ CBC Heparin gtt per pharmacy protocol  INFECTIOUS A:   Sepsis - secondary to healthcare associated pneumonia w/ Enterococcus Persistent FUO - started 9/22  P:   Day #4 of Vancomycin & Day # 3 of Merrem Repeat U/A, Blood Culture, & Tracheal Aspirate Ctx today. Trending procalcitonin  per algorithm  ENDOCRINE A:   H/O DM Type 2 - Uncontrolled at baseline. Worsening glucose due to dextrose IVF.  P:   Discontinue Levemir Start NPH 20u [place] q6hr x2 doses then 40u [place] q6hr beginning at 9pm SSI per resistant algorithm Accu-Checks q4hr Switching fluids to NS  NEUROLOGIC A:   Acute Encephalopathy - On presentation. Likely secondary to hypoxia & hypercarbia. Sedation on Ventilator  P:   RASS goal: -4 Fentanyl gtt & IV prn Pain Versed gtt & IV prn Sedation   FAMILY  - Updates: Brother Malachy ChamberJeffrey Burch updated via phone by Dr. Jamison NeighborNestor 9/26. Requested with continue full support until then.   TODAY'S SUMMARY:  45 y.o. male with morbid obesity here with septic shock secondary to healthcare acquired pneumonia leading to acute respiratory failure with hypoxemia requiring full ventilatory support. Also has a history of  diastolic heart failure. Continuing low tidal volume ventilation. Worsening shock & sepsis. Multi-system organ failure. Prognosis is dismal. Brother updated by Dr. Jamison NeighborNestor via phone requesting we continue to support him. Explained that his condition is terminal and he's unlike to survive this.   I have spent a total of 46 minutes of critical care time today caring for the patient, updating friends as well as family via phone,  and reviewing the patient's electronic medical record.   Donna ChristenJennings E. Jamison NeighborNestor, M.D. [facility] Pager:  [phone] After 3pm or if no response, call [phone] 8:00 AM 05/29/2016

## 2016-07-02 NOTE — Progress Notes (Signed)
Sputum sample obtained and sent down to main lab without complications.  

## 2016-07-02 NOTE — Progress Notes (Signed)
   06/09/2016 1300  Clinical Encounter Type  Visited With Patient not available  Visit Type Code  Recommendations (follow up after family arrives)    Chaplain responded to code on 7436m. Patient coded 3xs while chaplain present. Family not present, dr spoke on the phone to brother. Referred to unit chaplain for potential spiritual care opportunity when family arrives

## 2016-07-02 NOTE — Progress Notes (Signed)
Wasted fentanyl 100 ml and Versed 35 ml with second RN, Jamesetta SoPhyllis.

## 2016-07-02 NOTE — Code Documentation (Signed)
  Patient Name: [name]   MRN: [id number]   Date of Birth/ Sex: 11/29/1970 , male      Admission Date: 06/29/2016  Attending Provider: Nelda Bucksaniel J Feinstein, MD  Primary Diagnosis: <principal problem not specified>   Indication: Pt was in his usual state of health until this AM, when he was noted to be in PEA. Code blue was subsequently called. At the time of arrival on scene, ACLS protocol was underway.   Technical Description:  - CPR performance duration:  12 minutes  - Was defibrillation or cardioversion used? No   - Was external pacer placed? No  - Was patient intubated pre/post CPR? Yes   Medications Administered: Y = Yes; Blank = No Amiodarone    Atropine    Calcium    Epinephrine    Lidocaine    Magnesium    Norepinephrine    Phenylephrine    Sodium bicarbonate    Vasopressin     Post CPR evaluation:  - Final Status - Was patient successfully resuscitated ? Yes - What is current rhythm? VTach - What is current hemodynamic status? Hypotensive, tachycardic  Miscellaneous Information:  - Labs sent, including: BMP  - Primary team notified?  Yes  - Family Notified? Yes  - Additional notes/ transfer status: none     Alm BustardMatthew O'Sullivan, MD  11-12-15, 1:04 PM

## 2016-07-02 NOTE — Progress Notes (Signed)
Patient HR in the 170's and sustaining. MD made aware. Received order for digoxin once. Will continue with current plan of care.

## 2016-07-02 NOTE — Progress Notes (Signed)
   23-Jun-2016 0941  Clinical Encounter Type  Visited With Patient and family together  Visit Type Initial  Referral From Nurse  Consult/Referral To Chaplain  Spiritual Encounters  Spiritual Needs Prayer;Emotional  Stress Factors  Patient Stress Factors Exhausted  Family Stress Factors Exhausted  Stopped by room to offer prayer and emotional support to family.

## 2016-07-02 NOTE — Progress Notes (Signed)
PCCM INTERVAL PROGRESS NOTE  To bedside at family request to update on patient status. He has been progressively hypotensive and remains hypoxemic despite mutliple efforts throughout the day to remedy this. They [place]. He did not answer, message left to call us back when he can.   Will add levophed in attempt to bring BP up, however HR elevated. Levo may make this better or worse. May need amiodarone infusion. Lasix gtt stopped due to hypotension.   Joneen RoachPaul Alie Moudy, AGACNP-BC [facility] Pager [phone] or [phone]  06/21/2016 12:10 AM

## 2016-07-02 DEATH — deceased

## 2016-07-07 ENCOUNTER — Telehealth: Payer: Self-pay

## 2016-07-07 NOTE — Telephone Encounter (Signed)
This note was entered by mistake. °

## 2016-07-24 ENCOUNTER — Ambulatory Visit: Payer: Self-pay | Admitting: Pulmonary Disease
